# Patient Record
Sex: Male | Born: 1950 | Race: White | Hispanic: No | Smoking: Never smoker
Health system: Southern US, Community
[De-identification: ages and names within clinical notes are randomized; demographics above are authoritative.]

## PROBLEM LIST (undated history)

## (undated) DIAGNOSIS — C187 Malignant neoplasm of sigmoid colon: Secondary | ICD-10-CM

## (undated) DIAGNOSIS — R112 Nausea with vomiting, unspecified: Secondary | ICD-10-CM

## (undated) DIAGNOSIS — I10 Essential (primary) hypertension: Secondary | ICD-10-CM

## (undated) DIAGNOSIS — J189 Pneumonia, unspecified organism: Secondary | ICD-10-CM

## (undated) DIAGNOSIS — D649 Anemia, unspecified: Secondary | ICD-10-CM

## (undated) DIAGNOSIS — K56609 Unspecified intestinal obstruction, unspecified as to partial versus complete obstruction: Secondary | ICD-10-CM

## (undated) DIAGNOSIS — J869 Pyothorax without fistula: Secondary | ICD-10-CM

## (undated) DIAGNOSIS — E876 Hypokalemia: Secondary | ICD-10-CM

## (undated) DIAGNOSIS — I700 Atherosclerosis of aorta: Secondary | ICD-10-CM

## (undated) DIAGNOSIS — Z8673 Personal history of transient ischemic attack (TIA), and cerebral infarction without residual deficits: Secondary | ICD-10-CM

## (undated) DIAGNOSIS — I6529 Occlusion and stenosis of unspecified carotid artery: Secondary | ICD-10-CM

## (undated) DIAGNOSIS — I708 Atherosclerosis of other arteries: Secondary | ICD-10-CM

## (undated) DIAGNOSIS — J181 Lobar pneumonia, unspecified organism: Secondary | ICD-10-CM

## (undated) DIAGNOSIS — E860 Dehydration: Secondary | ICD-10-CM

## (undated) DIAGNOSIS — I7090 Unspecified atherosclerosis: Secondary | ICD-10-CM

## (undated) DIAGNOSIS — K635 Polyp of colon: Secondary | ICD-10-CM

## (undated) DIAGNOSIS — R140 Abdominal distension (gaseous): Secondary | ICD-10-CM

## (undated) DIAGNOSIS — E871 Hypo-osmolality and hyponatremia: Secondary | ICD-10-CM

## (undated) DIAGNOSIS — E7800 Pure hypercholesterolemia, unspecified: Secondary | ICD-10-CM

## (undated) DIAGNOSIS — J90 Pleural effusion, not elsewhere classified: Secondary | ICD-10-CM

## (undated) DIAGNOSIS — R911 Solitary pulmonary nodule: Secondary | ICD-10-CM

## (undated) DIAGNOSIS — I639 Cerebral infarction, unspecified: Secondary | ICD-10-CM

## (undated) HISTORY — DX: Occlusion and stenosis of unspecified carotid artery: I65.29

## (undated) HISTORY — DX: Essential (primary) hypertension: I10

## (undated) HISTORY — DX: Anemia, unspecified: D64.9

## (undated) HISTORY — DX: Unspecified atherosclerosis: I70.90

## (undated) HISTORY — DX: Hypokalemia: E87.6

## (undated) HISTORY — DX: Pyothorax without fistula: J86.9

## (undated) HISTORY — DX: Hypo-osmolality and hyponatremia: E87.1

## (undated) HISTORY — DX: Nausea with vomiting, unspecified: R11.2

## (undated) HISTORY — DX: Cerebral infarction, unspecified: I63.9

## (undated) HISTORY — DX: Pneumonia, unspecified organism: J18.9

## (undated) HISTORY — DX: Personal history of transient ischemic attack (TIA), and cerebral infarction without residual deficits: Z86.73

## (undated) HISTORY — DX: Lobar pneumonia, unspecified organism: J18.1

## (undated) HISTORY — DX: Pleural effusion, not elsewhere classified: J90

## (undated) HISTORY — DX: Polyp of colon: K63.5

## (undated) HISTORY — DX: Malignant neoplasm of sigmoid colon: C18.7

## (undated) HISTORY — DX: Atherosclerosis of other arteries: I70.8

## (undated) HISTORY — DX: Unspecified intestinal obstruction, unspecified as to partial versus complete obstruction: K56.609

## (undated) HISTORY — DX: Abdominal distension (gaseous): R14.0

## (undated) HISTORY — DX: Dehydration: E86.0

---

## 1982-05-29 HISTORY — PX: ANKLE FRACTURE SURGERY: SHX122

## 2008-05-29 DIAGNOSIS — I639 Cerebral infarction, unspecified: Secondary | ICD-10-CM

## 2008-05-29 HISTORY — DX: Cerebral infarction, unspecified: I63.9

## 2011-02-14 ENCOUNTER — Emergency Department (HOSPITAL_COMMUNITY): Payer: Self-pay

## 2011-02-14 ENCOUNTER — Inpatient Hospital Stay (HOSPITAL_COMMUNITY)
Admission: EM | Admit: 2011-02-14 | Discharge: 2011-02-17 | DRG: 066 | Disposition: A | Discharge status: Home / Self Care | Payer: Self-pay | Attending: Internal Medicine | Admitting: Internal Medicine

## 2011-02-14 DIAGNOSIS — R4701 Aphasia: Secondary | ICD-10-CM | POA: Diagnosis present

## 2011-02-14 DIAGNOSIS — I10 Essential (primary) hypertension: Secondary | ICD-10-CM | POA: Diagnosis present

## 2011-02-14 DIAGNOSIS — I63239 Cerebral infarction due to unspecified occlusion or stenosis of unspecified carotid arteries: Principal | ICD-10-CM | POA: Diagnosis present

## 2011-02-14 DIAGNOSIS — I658 Occlusion and stenosis of other precerebral arteries: Secondary | ICD-10-CM | POA: Diagnosis present

## 2011-02-15 ENCOUNTER — Inpatient Hospital Stay (HOSPITAL_COMMUNITY): Payer: Self-pay

## 2011-02-15 DIAGNOSIS — I6529 Occlusion and stenosis of unspecified carotid artery: Secondary | ICD-10-CM

## 2011-02-15 LAB — URINALYSIS, ROUTINE W REFLEX MICROSCOPIC
Ketones, ur: NEGATIVE mg/dL
Nitrite: NEGATIVE
Urobilinogen, UA: 1 mg/dL (ref 0.0–1.0)
pH: 5.5 (ref 5.0–8.0)

## 2011-02-15 LAB — DIFFERENTIAL
Basophils Absolute: 0 10*3/uL (ref 0.0–0.1)
Basophils Relative: 1 % (ref 0–1)
Lymphocytes Relative: 22 % (ref 12–46)
Neutro Abs: 5.8 10*3/uL (ref 1.7–7.7)
Neutrophils Relative %: 67 % (ref 43–77)

## 2011-02-15 LAB — CK TOTAL AND CKMB (NOT AT ARMC)
CK, MB: 2.3 ng/mL (ref 0.3–4.0)
Relative Index: INVALID (ref 0.0–2.5)
Total CK: 81 U/L (ref 7–232)

## 2011-02-15 LAB — COMPREHENSIVE METABOLIC PANEL
ALT: 28 U/L (ref 0–53)
Albumin: 4.1 g/dL (ref 3.5–5.2)
Alkaline Phosphatase: 55 U/L (ref 39–117)
Alkaline Phosphatase: 51 U/L (ref 39–117)
BUN: 16 mg/dL (ref 6–23)
CO2: 29 mEq/L (ref 19–32)
Calcium: 9.8 mg/dL (ref 8.4–10.5)
Calcium: 9.1 mg/dL (ref 8.4–10.5)
GFR calc Af Amer: >60 mL/min (ref >60)
GFR calc Af Amer: >60 mL/min (ref >60)
GFR calc non Af Amer: >60 mL/min (ref >60)
Glucose, Bld: 88 mg/dL (ref 70–99)
Glucose, Bld: 109 mg/dL — ABNORMAL HIGH (ref 70–99)
Potassium: 3.6 mEq/L (ref 3.5–5.1)
Potassium: 3.7 mEq/L (ref 3.5–5.1)
Sodium: 140 mEq/L (ref 135–145)
Total Protein: 7.9 g/dL (ref 6.0–8.3)
Total Protein: 7.4 g/dL (ref 6.0–8.3)

## 2011-02-15 LAB — RAPID URINE DRUG SCREEN, HOSP PERFORMED
Barbiturates: NOT DETECTED
Cocaine: NOT DETECTED
Opiates: NOT DETECTED
Tetrahydrocannabinol: POSITIVE — AB

## 2011-02-15 LAB — CBC
HCT: 45.1 % (ref 39.0–52.0)
Hemoglobin: 15.5 g/dL (ref 13.0–17.0)
MCH: 29.4 pg (ref 26.0–34.0)
MCHC: 33.5 g/dL (ref 30.0–36.0)
Platelets: 212 10*3/uL (ref 150–400)
RBC: 5.19 MIL/uL (ref 4.22–5.81)
WBC: 8.7 10*3/uL (ref 4.0–10.5)

## 2011-02-15 LAB — URINE MICROSCOPIC-ADD ON

## 2011-02-15 LAB — LIPID PANEL
Cholesterol: 215 mg/dL — ABNORMAL HIGH (ref 0–200)
HDL: 35 mg/dL — ABNORMAL LOW (ref >39)
Total CHOL/HDL Ratio: 6.1 RATIO
VLDL: 33 mg/dL (ref 0–40)

## 2011-02-15 LAB — AMMONIA: Ammonia: 14 umol/L (ref 11–60)

## 2011-02-15 LAB — APTT: aPTT: 30 seconds (ref 24–37)

## 2011-02-15 LAB — PROTIME-INR
INR: 1.02 (ref 0.00–1.49)
Prothrombin Time: 13.3 seconds (ref 11.6–15.2)
Prothrombin Time: 13.6 seconds (ref 11.6–15.2)

## 2011-02-15 LAB — CARDIAC PANEL(CRET KIN+CKTOT+MB+TROPI)
Relative Index: INVALID (ref 0.0–2.5)
Total CK: 75 U/L (ref 7–232)
Troponin I: <0.3 ng/mL (ref <0.30)

## 2011-02-15 LAB — HEMOGLOBIN A1C: Mean Plasma Glucose: 114 mg/dL (ref <117)

## 2011-02-16 DIAGNOSIS — I6789 Other cerebrovascular disease: Secondary | ICD-10-CM

## 2011-02-17 LAB — URINE CULTURE
Colony Count: >=100000
Culture  Setup Time: 201209192118

## 2011-02-18 NOTE — Consult Note (Signed)
Alex [name], Alex [name]              ACCOUNT NO.:  [id number]  MEDICAL RECORD NO.:  [id number]  LOCATION:  3012                         FACILITY:  [facility]  PHYSICIAN:  Weston Settle, MD   DATE OF BIRTH:  July 21, 1950  DATE OF CONSULTATION:  02/15/2011 DATE OF DISCHARGE:                                CONSULTATION   REASON FOR CONSULTATION:  Aphasia.  CONSULTING PHYSICIAN:  [facility].  HISTORY:  The patient is a 60 year old male with no significant past medical history who presented to the [facility] ED with complaints of headache and confusion with speech difficulty mainly and difficulty in repetition.  Family member noticed this after the patient came back from work and resolved by the time he arrived to the ED, which was approximately 2 hours.  Upon arrival to the ED, head CT was performed, which was negative.  The patient was subsequently admitted for TIA workup.  Upon examining the patient, the patient has no major focaldeficits and is able to follow commands.  His speech is fluent, but sometimes incoherent.  Family member who is with him stated that this is not his baseline.  The patient denies any other complaints.  Denies headaches, chest pain, or shortness of breath.  PAST MEDICAL HISTORY:  None.  HOME MEDICATIONS:  None.  CURRENT MEDICATIONS:  Aspirin, Tylenol, hydralazine, and Zofran.  ALLERGIES:  No known drug allergies.  FAMILY HISTORY:  Significant for hypertension, otherwise noncontributory.  SOCIAL HISTORY:  The patient denies smoking, denies alcohol, denies drugs.  REVIEW OF SYSTEMS:  Negative except for HPI.  PHYSICAL EXAMINATION:  VITALS:  Blood pressure 162/94, pulse 69, respiratory rate 18, temperature 98.4, O2 saturation 94% on room air. MENTAL STATUS:  Alert and oriented x3, carries out two-step commands. CRANIAL NERVES:  Eyes, pupils equal, round, and reactive to light. Extraocular movements intact.  Visual fields intact.  Face  symmetrical. Tongue is less midline and displays conductive aphasia.  Facial sensation intact.  Coordination, finger-to-nose is intact.  Gait not assessed.  Motor strength 5/5, symmetric in all extremities.  Deep tendon reflexes 2+ and symmetric in all extremities.  Sensation intact. Negative drift. PULMONARY:  Clear to auscultation bilaterally. CARDIOVASCULAR:  Regular rate and rhythm.  No murmurs, rubs, or gallops.  LABORATORY FINDINGS:  Sodium 140, potassium 3.7, chloride 102, bicarb 27, BUN 16, creatinine 0.87, glucose 109.  White count 8.7, hemoglobin 15.5, hematocrit 45.1, platelet count 201.  Total cholesterol 215, HDL 35, LDL 147, triglycerides 165.  IMAGING: 1. Head CT was negative. 2. 2-D echo is pending. 3. MRI shows a 1.5 cm region of acute infarct in the deep insula of     left temporoparietal region.  MRA shows completely occluded left     internal carotid artery most likely chronic.  ASSESSMENT/PLAN:  The patient is a 60 year old male with no significant past medical history who presents with left insular acute stroke withmainly conductive-type aphasia, which is resolving.  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 Therefore, I would like to maintain his systolic blood pressure between 140-160, also changing aspirin dose from 325 to 81 mg daily, starting Crestor 10 mg daily given his  elevated LDL, discontinue hydralazine given that it may decrease perfusion in the setting of left intracranial occlusion which may be catastrophic; however, if blood pressure is more than 180 may give a small dose of labetalol IV every 6 hours as needed. Order a 2-D echo to rule out wall motion abnormalities.  If you have any questions, please call.  Thank you very much for your consult.          ______________________________ Weston Settle, MD     SE/MEDQ  D:  02/15/2011  T:  02/16/2011  Job:  [id number]  Electronically Signed by  Weston Settle MD on 02/18/2011 03:57:25 PM

## 2011-02-26 NOTE — H&P (Signed)
Alex [name], Alex [name]              ACCOUNT NO.:  [id number]  MEDICAL RECORD NO.:  [id number]  LOCATION:  [facility]                         FACILITY:  [facility]  PHYSICIAN:  Eduard Clos, MDDATE OF BIRTH:  1950-09-30  DATE OF ADMISSION:  02/14/2011 DATE OF DISCHARGE:                             HISTORY & PHYSICAL   PRIMARY CARE PHYSICIAN:  Unassigned.  The patient does not have one.  CHIEF COMPLAINT:  Headache and confusion.  HISTORY OF PRESENT ILLNESS:  A 60 year old male with no significant past medical history was brought into ER.  The patient was found to be increasingly confused by the patient's family.  The patient at this time is still having some difficulty bringing out exact words.  He does speak something which is not clear and sometimes he speaks.  Initially when he was in the ER at 9 o'clock, he was giving history to the PA Dammen.  He was saying that he was back from work at 4 o'clock when he felt headache all over head and suddenly became confused and the family noticed and later on they brought him to ER.  He did not lose his consciousness, did not have any nausea, vomiting, or any blurriness in eye vision.  He did not have any focal deficit.  He did not have any chest pain, shortness of breath, nausea, vomiting, abdominal pain, dysuria, discharge, or diarrhea.  In the ER the patient had a CT head and at this time Neurology on call was consulted.  They have advised hospital admission and they will be seeing the patient in consult.  Further workup for possible CVA.  The patient at this time able to say where he is and his name, date of birth, date and time.  When I asked him what happened, he said he had some headaches and he sometimes states current words which are not clear and not able to exactly able to express.  Denies any pain.  He says the headache is resolved.  PAST MEDICAL HISTORY:  Nothing significant.  MEDICATIONS PRIOR TO ADMISSION:  None.  SOCIAL  HISTORY:  The patient denies smoking cigarettes, drinking alcohol, or using illegal drugs.  FAMILY HISTORY:  Nothing contributory.  REVIEW OF SYSTEM:  As per history of presenting illness, nothing else significant.  PHYSICAL EXAMINATION:  GENERAL:  The patient examined at bedside, not in acute distress. VITAL SIGNS:  Blood pressure 166/84, pulse 70 per minute, temperature 98.4, respiration 18, O2 sat 98%. HEENT:  Anicteric.  No pallor.  No discharge from ears, eyes, nose, or mouth.  No facial asymmetry.  PERRLA positive.  The patient is able to see in both eyes. NECK:  No neck rigidity.  No discharge from ears, eyes, nose, or mouth. CHEST:  Bilateral air entry present.  No rhonchi, no crepitation. HEART:  S1, S2 heard. ABDOMEN:  Soft, nontender.  Bowel sounds heard. CNS:  The patient is alert, awake, oriented to time, place, and person. At times he is confused and has difficulty to bring out words.  Moves upper and lower extremities 5/5.  There is no pronator drift.  No dysdiadochokinesia, no ataxia. EXTREMITIES:  Peripheral pulses felt.  No acute ischemic changes, cyanosis,  clubbing, or edema.  LABORATORY DATA:  EKG shows normal sinus rhythm with poor R-wave progression with nonspecific ST-T changes.  No old EKG to compare.  CT of the head without contrast shows no evidence of acute intracranial abnormality.  CBC:  WBC 10.8, hemoglobin 15.9, hematocrit is 47.5, platelets 212,000.  PT/INR is 13.3 and 0.9.  Complete metabolic panel: Sodium 141, potassium 3.6, chloride 101, carbon dioxide 29, glucose 88, BUN 16, creatinine 0.9, total bilirubin is 0.4, alkaline phosphatase 55, AST 30, ALT 31, total protein 7.9, albumin 4.4, calcium 9.8.  ASSESSMENT: 1. Possible cerebrovascular accident with transient ischemic attack. 2. Elevated blood pressure.  PLAN: 1. At this time admit the patient to telemetry. 2. For his episodes of confusion, headache, and episodes of expressing      difficulties we at this time consider possible cerebrovascular     accident versus transient ischemic attack.  We are going to get MRI     of the brain, MRA of the brain, 2-D echo, carotid Doppler.  We will     be consulting Neurology, Dr. Vickey Huger who is going to see the     patient later in consult.  I already discussed with Dr. Vickey Huger.     The patient was not a candidate for TPA as symptoms are improving. 3. Elevated blood pressure.  At this time we are going to keep the     patient on p.r.n. hydralazine for systolic blood pressure more than     200 and diastolic more than 110 and if blood pressure is still high     during the stay, he will need definite antihypertensives     eventually. 4. We are going to get a chest x-ray, ammonia level of urine and urine     drug screen. 5. Further recommendations as condition evolves.     Eduard Clos, MD     ANK/MEDQ  D:  02/15/2011  T:  02/15/2011  Job:  [id number]  Electronically Signed by Midge Minium MD on 02/26/2011 11:59:42 AM

## 2011-03-01 NOTE — Consult Note (Signed)
NAMEMANSOUR, BALBOA              ACCOUNT NO.:  [id number]  MEDICAL RECORD NO.:  [id number]  LOCATION:  3012                         FACILITY:  [facility]  PHYSICIAN:  Janetta Hora. Nga Rabon, MD  DATE OF BIRTH:  11-Feb-1951  DATE OF CONSULTATION:  02/15/2011 DATE OF DISCHARGE:                                CONSULTATION   REASON FOR CONSULTATION:  Left internal carotid artery occlusion with stroke.  REQUESTING PHYSICIAN:  Baltazar Najjar, MD, [facility].  HISTORY OF PRESENT ILLNESS:  The patient is a 60 year old male who presented the emergency room yesterday.  At that time, he had increased confusion and some speech difficulty.  He was having difficulty gathering words.  He did not really seem to have any significant motor deficits.  He still has some confusion today and mild slurring of speech.  He also still has some trouble gathering words.  MRA of the head revealed a left intracranial internal carotid artery occlusion with reconstitution distally.  Atherosclerotic risk factors, hypertension.  The patient denies history of tobacco abuse or elevated cholesterol or coronary artery disease.  He has no family history of early vascular disease at age less than 41.  He does have several relatives who have a coronary artery disease in their 37s.  PAST MEDICAL HISTORY:  As above.  PAST SURGICAL HISTORY:  None.  REVIEW OF SYSTEMS:  He denies chest pain.  He has no prior history of stroke.  He has no shortness of breath.  He describes no visual changes.  MEDICATIONS:  The patient was on no medications prior to hospitalization.  He is currently on aspirin 325 mg once a day, Ativan p.r.n., hydralazine p.r.n.  He has no known drug allergies.  SOCIAL HISTORY:  Denies tobacco abuse.  Denies alcohol or drug abuse.  PHYSICAL EXAMINATION:  VITAL SIGNS:  Blood pressure 176/101, temperature 97.9, pulse 74, respirations 20, oxygen saturation 96% on room air. HEENT:  Negative. NECK:   Carotid pulses 2+. CHEST:  Clear to auscultation. EXTREMITIES:  Radial pulses 2+ bilaterally. SKIN:  No rash. NEUROLOGIC:  Upper extremity and lower extremity motor strength 5/5. Finger-nose-finger intact.  Cranial nerves II through XII intact.  MRA of the brain as well as MRI of the brain and CT scan of the head were all reviewed.  There is occlusion of the left internal carotid artery with reconstitution of the supraclinoid ICA.  The portions of the right internal carotid artery that are visualized are without stenosis. There is an insular infarct on the left side with some surrounding petechiae.  ASSESSMENT:  Stroke with left internal carotid artery occlusion.  After an internal carotid artery has been occluded, there is no benefit of revascularization for stroke prophylaxis.  However, I would obtain a duplex exam of his internal carotid artery bilaterally to confirm whether or not he has any significant stenosis of the vertebral arteries as well as evaluate the right internal carotid artery as he may have contralateral disease.  We will follow along until the duplex results are obtained.     Janetta Hora. Oziel Beitler, MD     CEF/MEDQ  D:  02/15/2011  T:  02/16/2011  Job:  [id number]  Electronically Signed  by Fabienne Bruns MD on 03/01/2011 11:40:55 AM

## 2011-03-02 ENCOUNTER — Ambulatory Visit: Payer: Self-pay | Attending: Internal Medicine

## 2011-03-02 DIAGNOSIS — I69920 Aphasia following unspecified cerebrovascular disease: Secondary | ICD-10-CM | POA: Insufficient documentation

## 2011-03-02 DIAGNOSIS — IMO0001 Reserved for inherently not codable concepts without codable children: Secondary | ICD-10-CM | POA: Insufficient documentation

## 2011-03-08 NOTE — Discharge Summary (Signed)
Alex [name], Alex [name]              ACCOUNT NO.:  [id number]  MEDICAL RECORD NO.:  [id number]  LOCATION:  3012                         FACILITY:  [facility]  PHYSICIAN:  Baltazar Najjar, MD     DATE OF BIRTH:  10/29/1950  DATE OF ADMISSION:  02/14/2011 DATE OF DISCHARGE:                              DISCHARGE SUMMARY   FINAL DISCHARGE DIAGNOSES: 1. Left insular acute infarct. 2. Hyperlipidemia. 3. Bilateral carotid artery occlusion, left more than right.  CONSULTATIONS: 1. Neurology Service. 2. Vascular Surgery.  The patient was seen by Dr. Fabienne Bruns.  RADIOLOGY/IMAGING STUDIES: 1. CT head done February 15, 2011 showed no evidence of acute     intracranial abnormality. 2. MRI of the brain showed 1.5 cm region of acute infarction in the     deep insular on the left.  Some petechial blood products, but no     frank hematoma.  Supple areas of low level restricted diffusion in     the temporal parietal region, posterior to the definite infarction     could developing to infarction subsequently. 3. MRA head showed complete occlusion of the left internal carotid     artery.  Reconstitution of the flow in the left supraclinoid ICA     apparently primarily from fluid throughout patent anterior     communicating artery.  There is also probably contribution from the     left posterior communicating artery. 4. Carotid duplex ultrasound showed left ICA complete occlusion, right     ICA showed 40% to 59% stenosis.  Vertebral flow is antegrade     bilaterally. 5. 2-D echocardiogram was poor quality study, however, showed EF of     60%.  Atrial septum was unable to visualize.  There was no reported     valvular disease.  BRIEF ADMITTING HISTORY:  Please refer to H and P for more details.  On summary, Alex [name] is a 60 year old man with no significant past medical history, presented to the ER on February 15, 2011 with chief complaint of headaches and confusion and difficulty  with his speech.  Please refer to H and P for more details.  HOSPITAL COURSE:  The patient was admitted with possible CVA versus TIA. His imaging studies done showed evidence of left insular infarct as above.  The patient was seen by Neurology Service and he was started on aspirin and Plavix.  His MRI also showed evidence of left carotid artery complete occlusion.  He was seen by Dr. Darrick Penna from Vascular Surgery who recommended medical treatment and he ordered carotid duplex study, results as above, confirmed the complete occlusion on the left and showed 40% to 59% occlusion on the right side.  The patient will be discharged on aspirin and Plavix as per Neurology recommendation to continue on aspirin and Plavix for 3 months, then continue with aspirin only after work.  The patient's blood pressure was initially elevated. He was given IV labetalol p.r.n.  However, his blood pressure stabilizes after works, he will need to be monitored closely and if he needs to be started on antihypertensive medicines as an outpatient that needs to be decided by his PCP.  However, his blood  pressure is currently stable.  The patient's labs also showed evidence of hyperlipidemia.  His LDL cholesterol was 147.  Cholesterol was 215.  His triglyceride was 165. The patient was started on Crestor and he will be discharged on it to follow with his PCP.  His initial workup also showed evidence of pyuria, so the patient was started on Rocephin IV and his urine culture was sent.  As of today, his urine culture report still preliminary and culture reintubated for better growth. Today is his third day of Rocephin, so I will not discharge him on any antibiotics.  Culture report to be followed as an outpatient.  The patient is seen and examined by me today.  His speech is improving. He was seen by speech therapy during this hospitalization and we will arrange for speech therapy as an outpatient as well.  He has no  motor or sensory deficit on neurological exam.  He is feeling better and denies any complaints.  Today, his vital signs; blood pressure 104/70, heart rate of 64, temperature 97.9, and O2 sat 93% on room air.  The patient is medically stable for discharge to home to follow with his PCP.  DISCHARGE MEDICATIONS: 1. Aspirin 81 mg p.o. daily. 2. Plavix 75 mg p.o. daily. 3. Crestor 10 mg p.o. daily at bedtime.  DISCHARGE INSTRUCTIONS: 1. The patient to continue above medications as prescribed. 2. As per Neurology recommendation, he will need to be on aspirin and     Plavix for 3 months, then switch to aspirin alone. 3. Speech therapy will be arranged by case manager for outpatient     therapy. 4. The patient had appointment with [facility] arranged for     eligibility on February 28, 2011 at 9:45 a.m. and see [name] on     March 15, 2011 at 3 p.m.  The patient also need to see Dr. Darrick Penna     in 6 months for repeat carotid Doppler sonogram. 5. The patient advised to report any worsening of symptoms or any new     symptoms to his PCP or come to the ED if needed. 6. The patient also to follow with Neurology Service as well.7. The patient also advised on lifestyle modification, monitoring his     blood pressure, and healthy diet.  CONDITION ON DISCHARGE:  Improved.          ______________________________ Baltazar Najjar, MD     SA/MEDQ  D:  02/17/2011  T:  02/17/2011  Job:  [id number]  cc:   [name], MD [name], MD [name]  Electronically Signed by Hannah Beat MD on 03/08/2011 09:10:45 PM

## 2011-03-09 ENCOUNTER — Ambulatory Visit: Payer: Self-pay

## 2011-03-14 ENCOUNTER — Ambulatory Visit: Payer: Self-pay

## 2011-03-16 ENCOUNTER — Ambulatory Visit: Payer: Self-pay

## 2011-03-21 ENCOUNTER — Ambulatory Visit: Payer: Self-pay

## 2011-03-23 ENCOUNTER — Ambulatory Visit: Payer: Self-pay

## 2011-03-28 ENCOUNTER — Ambulatory Visit: Payer: Self-pay

## 2011-04-04 ENCOUNTER — Ambulatory Visit: Payer: Self-pay | Attending: Internal Medicine

## 2011-04-04 DIAGNOSIS — IMO0001 Reserved for inherently not codable concepts without codable children: Secondary | ICD-10-CM | POA: Insufficient documentation

## 2011-04-04 DIAGNOSIS — I69920 Aphasia following unspecified cerebrovascular disease: Secondary | ICD-10-CM | POA: Insufficient documentation

## 2011-04-11 ENCOUNTER — Ambulatory Visit: Payer: Self-pay

## 2011-08-31 ENCOUNTER — Encounter: Payer: Self-pay | Admitting: Vascular Surgery

## 2011-09-05 ENCOUNTER — Encounter: Payer: Self-pay | Admitting: Neurosurgery

## 2011-09-07 ENCOUNTER — Ambulatory Visit (INDEPENDENT_AMBULATORY_CARE_PROVIDER_SITE_OTHER): Payer: Self-pay | Admitting: *Deleted

## 2011-09-07 ENCOUNTER — Encounter: Payer: Self-pay | Admitting: Neurosurgery

## 2011-09-07 ENCOUNTER — Ambulatory Visit (INDEPENDENT_AMBULATORY_CARE_PROVIDER_SITE_OTHER): Payer: Self-pay | Admitting: Neurosurgery

## 2011-09-07 VITALS — BP 155/103 | HR 64 | Resp 16 | Ht 66.0 in | Wt 223.3 lb

## 2011-09-07 DIAGNOSIS — I6529 Occlusion and stenosis of unspecified carotid artery: Secondary | ICD-10-CM

## 2011-09-07 NOTE — Progress Notes (Addendum)
[facility] [place] [facility] HISTORY AND PHYSICAL   CC: Initial carotid duplex exam in office Referring Physician: Fields  History of Present Illness: 61-year-old male patient who was seen in consultation by Dr. Darrick Penna September 2012 after a CVA. Patient reports no further signs or symptoms of CVA, TIA. Amaurosis fugax, diplopia., Dysphasia. Patient has not been followed at another office status post CVA.  Past Medical History  Diagnosis Date  . Stroke   . Hypertension   . Atherosclerosis of arteries   . Carotid artery occlusion     ROS: [x]  Positive   [ ]  Denies    General: [ ]  Weight loss, [ ]  Fever, [ ]  chills Neurologic: [ ]  Dizziness, [ ]  Blackouts, [ ]  Seizure [ ]  Stroke, [ ]  "Mini stroke", [ ]  Slurred speech, [ ]  Temporary blindness; [ ]  weakness in arms or legs, [ ]  Hoarseness Cardiac: [ ]  Chest pain/pressure, [ ]  Shortness of breath at rest [ ]  Shortness of breath with exertion, [ ]  Atrial fibrillation or irregular heartbeat Vascular: [ ]  Pain in legs with walking, [ ]  Pain in legs at rest, [ ]  Pain in legs at night,  [ ]  Non-healing ulcer, [ ]  Blood clot in vein/DVT,   Pulmonary: [ ]  Home oxygen, [ ]  Productive cough, [ ]  Coughing up blood, [ ]  Asthma,  [ ]  Wheezing Musculoskeletal:  [ ]  Arthritis, [ ]  Low back pain, [ ]  Joint pain Hematologic: [ ]  Easy Bruising, [ ]  Anemia; [ ]  Hepatitis Gastrointestinal: [ ]  Blood in stool, [ ]  Gastroesophageal Reflux/heartburn, [ ]  Trouble swallowing Urinary: [ ]  chronic Kidney disease, [ ]  on HD - [ ]  MWF or [ ]  TTHS, [ ]  Burning with urination, [ ]  Difficulty urinating Skin: [ ]  Rashes, [ ]  Wounds Psychological: [ ]  Anxiety, [ ]  Depression   Social History History  Substance Use Topics  . Smoking status: Never Smoker   . Smokeless tobacco: Not on file  . Alcohol Use: No    Family History Family History  Problem Relation Age of Onset  . Heart disease Other   . Hypertension Father   . Diabetes Sister   .  Diabetes Brother     No Known Allergies  Current Outpatient Prescriptions  Medication Sig Dispense Refill  . aspirin 81 MG tablet Take 81 mg by mouth daily.      . clopidogrel (PLAVIX) 75 MG tablet Take 75 mg by mouth daily.      . simvastatin (ZOCOR) 10 MG tablet Take 10 mg by mouth at bedtime.      Marland Kitchen aspirin 325 MG tablet Take 81 mg by mouth daily.       Marland Kitchen HYDRALAZINE HCL PO Take by mouth.      Marland Kitchen LORazepam (ATIVAN PO) Take by mouth.        Physical Examination  Filed Vitals:   09/07/11 1104  BP: 155/103  Pulse: 64  Resp: 16    Body mass index is 36.04 kg/(m^2).  General:  WDWN in NAD Gait: Normal HEENT: WNL Eyes: Pupils equal Pulmonary: normal non-labored breathing , without Rales, rhonchi,  wheezing Cardiac: RRR, without  Murmurs, rubs or gallops; Abdomen: soft, NT, no masses Skin: no rashes, ulcers noted  Vascular Exam Pulses: Patient has 2+ radial pulses bilaterally Carotid bruits are not heard, left carotid is occluded, right carotid has a weakened pulse auscultation Extremities without ischemic changes, no Gangrene , no cellulitis; no open wounds;  Musculoskeletal: no muscle wasting  or atrophy   Neurologic: A&O X 3; Appropriate Affect ; SENSATION: normal; MOTOR FUNCTION:  moving all extremities equally. Speech is fluent/normal  Non-Invasive Vascular Imaging CAROTID DUPLEX 09/07/2011  Right ICA 40 - 59 % stenosis Left ICA 0ccluded stenosis   ASSESSMENT/PLAN: Assessment per above, plan will be for the patient to return in 6 months for repeat carotid duplex. I did discuss this with Dr. Darrick Penna and he agrees. The patient is in agreement his questions were encouraged and answered we'll see him back in 6 months or sooner if needed.  Lauree Chandler ANP   Clinic MD: Fields  Pt with left ICA occlusion.  No indication for revascularization in this situation Contralateral asymptomatic moderate stenosis needs long term follow up  Fabienne Bruns, MD [facility] [place] Office: [phone] Pager: [phone]

## 2011-09-15 NOTE — Procedures (Unsigned)
CAROTID DUPLEX EXAM  INDICATION:  Carotid disease  HISTORY: Diabetes:  No Cardiac:  No Hypertension:  Yes Smoking:  No Previous Surgery:  No CV History:  CVA history 02/14/2011 Amaurosis Fugax No, Paresthesias No, Hemiparesis No                                      RIGHT             LEFT Brachial systolic pressure:         140               142 Brachial Doppler waveforms:         Normal            Normal Vertebral direction of flow:        Antegrade         Antegrade DUPLEX VELOCITIES (cm/sec) CCA peak systolic                   89                85 ECA peak systolic                   119               102 ICA peak systolic                   120               M=99 ICA end diastolic                   49                M=33 PLAQUE MORPHOLOGY:                  Heterogeneous     Heterogeneous PLAQUE AMOUNT:                      Moderate          Moderate/severe PLAQUE LOCATION:                    ICA               ICA  IMPRESSION: 1. Right internal carotid artery velocities suggest 40%-59% stenosis. 2. Left internal carotid artery is seen with a small amount of blood     flow in the proximal and mid segments, then appears to occlude     distally. 3. Bilateral antegrade vertebral arteries.  ___________________________________________ Janetta Hora Fields, MD  EM/MEDQ  D:  09/08/2011  T:  09/08/2011  Job:  [id number]

## 2012-03-08 ENCOUNTER — Other Ambulatory Visit: Payer: Self-pay | Admitting: *Deleted

## 2012-03-08 DIAGNOSIS — I63239 Cerebral infarction due to unspecified occlusion or stenosis of unspecified carotid arteries: Secondary | ICD-10-CM

## 2012-03-13 ENCOUNTER — Encounter: Payer: Self-pay | Admitting: Neurosurgery

## 2012-03-14 ENCOUNTER — Other Ambulatory Visit (INDEPENDENT_AMBULATORY_CARE_PROVIDER_SITE_OTHER): Payer: Self-pay | Admitting: *Deleted

## 2012-03-14 ENCOUNTER — Encounter: Payer: Self-pay | Admitting: Neurosurgery

## 2012-03-14 ENCOUNTER — Ambulatory Visit (INDEPENDENT_AMBULATORY_CARE_PROVIDER_SITE_OTHER): Payer: Self-pay | Admitting: Neurosurgery

## 2012-03-14 VITALS — BP 149/98 | HR 62 | Resp 16 | Ht 67.0 in | Wt 228.3 lb

## 2012-03-14 DIAGNOSIS — I6529 Occlusion and stenosis of unspecified carotid artery: Secondary | ICD-10-CM

## 2012-03-14 DIAGNOSIS — I63239 Cerebral infarction due to unspecified occlusion or stenosis of unspecified carotid arteries: Secondary | ICD-10-CM

## 2012-03-14 NOTE — Progress Notes (Signed)
[facility] [place] Carotid Office Note  CC: Carotid surveillance Referring Physician: Fields  History of Present Illness: 61 year old male patient of Dr. Darrick Penna with no history of carotid intervention. The patient denies any signs or symptoms of CVA, TIA, amaurosis fugax or any neural deficit. The patient denies any new medical diagnoses or recent surgery.  Past Medical History  Diagnosis Date  . Stroke   . Hypertension   . Atherosclerosis of arteries   . Carotid artery occlusion     ROS: [x]  Positive   [ ]  Denies    General: [ ]  Weight loss, [ ]  Fever, [ ]  chills Neurologic: [ ]  Dizziness, [ ]  Blackouts, [ ]  Seizure [ ]  Stroke, [ ]  "Mini stroke", [ ]  Slurred speech, [ ]  Temporary blindness; [ ]  weakness in arms or legs, [ ]  Hoarseness Cardiac: [ ]  Chest pain/pressure, [ ]  Shortness of breath at rest [ ]  Shortness of breath with exertion, [ ]  Atrial fibrillation or irregular heartbeat Vascular: [ ]  Pain in legs with walking, [ ]  Pain in legs at rest, [ ]  Pain in legs at night,  [ ]  Non-healing ulcer, [ ]  Blood clot in vein/DVT,   Pulmonary: [ ]  Home oxygen, [ ]  Productive cough, [ ]  Coughing up blood, [ ]  Asthma,  [ ]  Wheezing Musculoskeletal:  [ ]  Arthritis, [ ]  Low back pain, [ ]  Joint pain Hematologic: [ ]  Easy Bruising, [ ]  Anemia; [ ]  Hepatitis Gastrointestinal: [ ]  Blood in stool, [ ]  Gastroesophageal Reflux/heartburn, [ ]  Trouble swallowing Urinary: [ ]  chronic Kidney disease, [ ]  on HD - [ ]  MWF or [ ]  TTHS, [ ]  Burning with urination, [ ]  Difficulty urinating Skin: [ ]  Rashes, [ ]  Wounds Psychological: [ ]  Anxiety, [ ]  Depression   Social History History  Substance Use Topics  . Smoking status: Never Smoker   . Smokeless tobacco: Not on file  . Alcohol Use: No    Family History Family History  Problem Relation Age of Onset  . Heart disease Other   . Hypertension Father   . Stroke Father   . Diabetes Sister   . Diabetes Brother     No  Known Allergies  Current Outpatient Prescriptions  Medication Sig Dispense Refill  . aspirin 81 MG tablet Take 81 mg by mouth daily.      . clopidogrel (PLAVIX) 75 MG tablet Take 75 mg by mouth daily.      . simvastatin (ZOCOR) 10 MG tablet Take 10 mg by mouth at bedtime.      Marland Kitchen aspirin 325 MG tablet Take 81 mg by mouth daily.       Marland Kitchen HYDRALAZINE HCL PO Take by mouth.      Marland Kitchen LORazepam (ATIVAN PO) Take by mouth.        Physical Examination  Filed Vitals:   03/14/12 1455  BP: 149/98  Pulse: 62  Resp:     Body mass index is 35.76 kg/(m^2).  General:  WDWN in NAD Gait: Normal HEENT: WNL Eyes: Pupils equal Pulmonary: normal non-labored breathing , without Rales, rhonchi,  wheezing Cardiac: RRR, without  Murmurs, rubs or gallops; Abdomen: soft, NT, no masses Skin: no rashes, ulcers noted  Vascular Exam Pulses: 3+ radial pulses bilaterally Carotid bruits: Right carotid pulse to auscultation, left occlusion Extremities without ischemic changes, no Gangrene , no cellulitis; no open wounds;  Musculoskeletal: no muscle wasting or atrophy   Neurologic: A&O X 3; Appropriate Affect ; SENSATION:  normal; MOTOR FUNCTION:  moving all extremities equally. Speech is fluent/normal  Non-Invasive Vascular Imaging CAROTID DUPLEX 03/14/2012  Right ICA 20 - 39 % stenosis Left ICA 0ccluded stenosis   ASSESSMENT/PLAN: asymptomatic patient with minimal right carotid stenosis. This is unchanged from previous exam. The patient will followup in one year with repeat carotid duplex, his questions were encouraged and answered, he is in agreement with this plan.  Lauree Chandler ANP   Clinic MD: Darrick Penna

## 2012-03-15 NOTE — Addendum Note (Signed)
Addended by: Sharee Pimple on: 03/15/2012 07:43 AM   Modules accepted: Orders

## 2013-03-12 ENCOUNTER — Encounter: Payer: Self-pay | Admitting: Family

## 2013-03-13 ENCOUNTER — Inpatient Hospital Stay (HOSPITAL_COMMUNITY): Admission: RE | Admit: 2013-03-13 | Payer: Self-pay | Source: Ambulatory Visit

## 2013-03-13 ENCOUNTER — Ambulatory Visit: Payer: Self-pay | Admitting: Family

## 2013-04-02 ENCOUNTER — Encounter: Payer: Self-pay | Admitting: Family

## 2013-04-03 ENCOUNTER — Ambulatory Visit: Payer: Self-pay | Admitting: Family

## 2013-04-03 ENCOUNTER — Other Ambulatory Visit (HOSPITAL_COMMUNITY): Payer: Self-pay

## 2013-06-18 ENCOUNTER — Encounter: Payer: Self-pay | Admitting: Vascular Surgery

## 2013-06-19 ENCOUNTER — Ambulatory Visit (INDEPENDENT_AMBULATORY_CARE_PROVIDER_SITE_OTHER): Payer: Self-pay | Admitting: Vascular Surgery

## 2013-06-19 ENCOUNTER — Encounter: Payer: Self-pay | Admitting: Vascular Surgery

## 2013-06-19 ENCOUNTER — Ambulatory Visit (HOSPITAL_COMMUNITY)
Admission: RE | Admit: 2013-06-19 | Discharge: 2013-06-19 | Disposition: A | Discharge status: Home / Self Care | Payer: Self-pay | Source: Ambulatory Visit | Attending: Vascular Surgery | Admitting: Vascular Surgery

## 2013-06-19 VITALS — BP 136/87 | HR 74 | Ht 67.0 in | Wt 211.2 lb

## 2013-06-19 DIAGNOSIS — I6529 Occlusion and stenosis of unspecified carotid artery: Secondary | ICD-10-CM

## 2013-06-19 DIAGNOSIS — I658 Occlusion and stenosis of other precerebral arteries: Secondary | ICD-10-CM | POA: Insufficient documentation

## 2013-06-19 NOTE — Progress Notes (Signed)
Patient is a 63 year old male who returns for followup today. He was originally seen in 2012 after a stroke. He had a left internal carotid artery occlusion at that time. He had a moderate right internal carotid artery stenosis. He is currently on aspirin and Plavix. He continues to deny any symptoms of stroke TIA or amaurosis. Other chronic medical problems include hypertension.  Past Medical History  Diagnosis Date  . Stroke   . Hypertension   . Atherosclerosis of arteries   . Carotid artery occlusion     History reviewed. No pertinent past surgical history.  Review of systems: He denies shortness of breath. He denies chest pain.  Physical exam:  Filed Vitals:   06/19/13 1607 06/19/13 1609  BP: 134/89 136/87  Pulse: 74   Height: 5\' 7"  (1.702 m)   Weight: 211 lb 3.2 oz (95.8 kg)   SpO2: 97%     Neck: No carotid bruits  Chest: Clear to auscultation bilaterally  Cardiac: Regular rate and rhythm  Neuro: Symmetric upper and lower extremity motor strength 5 over 5, no facial asymmetry  Data: Patient had carotid duplex exam today which I reviewed and interpreted. 40-60% right internal carotid artery stenosis no significant change chronic left internal carotid artery occlusion, bilateral antegrade vertebral flow  Assessment: Moderate asymptomatic right internal carotid artery stenosis  Plan: Continue Plavix and aspirin followup carotid duplex scan in 1 year  Ruta Hinds, MD [facility] [place] Office: [phone] Pager: [phone]

## 2013-06-20 NOTE — Addendum Note (Signed)
Addended by: Mena Goes on: 06/20/2013 01:02 PM   Modules accepted: Orders

## 2013-06-20 NOTE — Addendum Note (Signed)
Addended by: Mena Goes on: 06/20/2013 08:18 AM   Modules accepted: Orders

## 2014-06-24 ENCOUNTER — Encounter: Payer: Self-pay | Admitting: Vascular Surgery

## 2014-06-25 ENCOUNTER — Other Ambulatory Visit (HOSPITAL_COMMUNITY): Payer: Self-pay

## 2014-06-25 ENCOUNTER — Ambulatory Visit: Payer: Self-pay | Admitting: Vascular Surgery

## 2014-07-29 ENCOUNTER — Encounter: Payer: Self-pay | Admitting: Vascular Surgery

## 2014-07-30 ENCOUNTER — Encounter: Payer: Self-pay | Admitting: Vascular Surgery

## 2014-07-30 ENCOUNTER — Ambulatory Visit (HOSPITAL_COMMUNITY)
Admission: RE | Admit: 2014-07-30 | Discharge: 2014-07-30 | Disposition: A | Discharge status: Home / Self Care | Payer: Self-pay | Source: Ambulatory Visit | Attending: Vascular Surgery | Admitting: Vascular Surgery

## 2014-07-30 ENCOUNTER — Ambulatory Visit (INDEPENDENT_AMBULATORY_CARE_PROVIDER_SITE_OTHER): Payer: Self-pay | Admitting: Vascular Surgery

## 2014-07-30 ENCOUNTER — Other Ambulatory Visit: Payer: Self-pay | Admitting: Vascular Surgery

## 2014-07-30 VITALS — BP 132/87 | HR 70 | Resp 14 | Ht 66.0 in | Wt 209.0 lb

## 2014-07-30 DIAGNOSIS — I6522 Occlusion and stenosis of left carotid artery: Secondary | ICD-10-CM

## 2014-07-30 DIAGNOSIS — I6521 Occlusion and stenosis of right carotid artery: Secondary | ICD-10-CM | POA: Insufficient documentation

## 2014-07-30 DIAGNOSIS — I6523 Occlusion and stenosis of bilateral carotid arteries: Secondary | ICD-10-CM

## 2014-07-30 NOTE — Addendum Note (Signed)
Addended by: Mena Goes on: 07/30/2014 05:09 PM   Modules accepted: Orders

## 2014-07-30 NOTE — Progress Notes (Addendum)
HISTORY AND PHYSICAL     CC:  Follow up carotid duplex scan  Referring Provider:  Dorna Mai, MD  HPI: This is a 64 y.o. male who has known carotid stenosis is here for f/u carotid duplex scan.  Denies amaurosis fugax, paresthesias, or hemiparesis.  He states that he did have a stroke in 2011. He has a chronically occluded left internal carotid artery. He did have speech difficulties.  He states that he does have some speech difficulty now, but if he slows down and thinks about it, he does not have difficulty.  He has not had any other symptoms.    He has never smoked.  He is on a statin for hypercholesterolemia.  He is on Plavix and aspirin s/p stroke.    His daughter stated that he had had some weight loss as he weighed 214lbs at her house and was 208lbs at the doctors appt a week later.  A year earlier at his annual appt, he weighed 209lbs.    Otherwise, he has been doing quite well.    Past Medical History  Diagnosis Date  . Stroke   . Hypertension   . Atherosclerosis of arteries   . Carotid artery occlusion     Past Surgical History  Procedure Laterality Date  . Foot surgery      No Known Allergies  Current Outpatient Prescriptions  Medication Sig Dispense Refill  . aspirin 81 MG tablet Take 81 mg by mouth daily.    . clopidogrel (PLAVIX) 75 MG tablet Take 75 mg by mouth daily.    Marland Kitchen HYDRALAZINE HCL PO Take by mouth.    Marland Kitchen LORazepam (ATIVAN PO) Take by mouth.    . simvastatin (ZOCOR) 10 MG tablet Take 10 mg by mouth at bedtime.    Marland Kitchen aspirin 325 MG tablet Take 81 mg by mouth daily.      No current facility-administered medications for this visit.    Pt's meds include: Statin:  Yes.   Beta Blocker:  No. Aspirin:  Yes.   Other antiplatelets/anticoagulants:  Yes.   Plavix   Family History  Problem Relation Age of Onset  . Heart disease Other   . Hypertension Father   . Stroke Father   . Heart disease Father   . Diabetes Sister   . Diabetes Brother      History   Social History  . Marital Status: Legally Separated    Spouse Name: N/A  . Number of Children: N/A  . Years of Education: N/A   Occupational History  . Not on file.   Social History Main Topics  . Smoking status: Never Smoker   . Smokeless tobacco: Never Used  . Alcohol Use: No  . Drug Use: No  . Sexual Activity: Not on file   Other Topics Concern  . Not on file   Social History Narrative     ROS: [x]  Positive   [ ]  Negative   [ ]  All sytems reviewed and are negative  Cardiovascular: []  chest pain/pressure []  palpitations []  SOB lying flat []  DOE []  pain in legs while walking []  pain in feet when lying flat []  hx of DVT []  hx of phlebitis []  swelling in legs []  varicose veins  Pulmonary: []  productive cough []  asthma []  wheezing  Neurologic: []  weakness in []  arms []  legs [x]  numbness in [x]  arms while sleeping-positional []  legs [x] difficulty speaking or slurred speech []  temporary loss of vision in one eye []  dizziness [x]  hx CVA  in 2011  Hematologic: [x]  bleeding problems-bleeds a little longer on Plavix []  problems with blood clotting easily  GI []  vomiting blood []  blood in stool  GU: []  burning with urination []  blood in urine  Psychiatric: []  hx of major depression  Integumentary: []  rashes []  ulcers  Constitutional: []  fever []  chills   PHYSICAL EXAMINATION:  Filed Vitals:   07/30/14 1445  BP: 132/87  Pulse: 70  Resp:    Body mass index is 33.75 kg/(m^2).  General:  WDWN in NAD Gait: Normal HENT: WNL; normocephalic Pulmonary: normal non-labored breathing , without Rales, rhonchi,  wheezing Cardiac: regular, without  Murmurs, rubs or gallops; without carotid bruits Abdomen: soft, NT, no masses Skin: without rashes,  without ulcers  Vascular Exam/Pulses:  Right Left  Radial 2+ (normal) 2+ (normal)  Ulnar Unable to palpate  Unable to palpate   DP 2+ (normal) 2+ (normal)   Extremities: without  ischemic changes, without Gangrene , without cellulitis; without open wounds;  Musculoskeletal: without muscle wasting or atrophy  Neurologic: A&O X 3; Appropriate Affect ; SENSATION: normal; MOTOR FUNCTION:  moving all extremities equally. Speech is fluent/normal   Non-Invasive Vascular Imaging: Carotid Duplex Scan:  07/30/2014  1.  Right ICA stenosis present in the 40%-59% range 2.  Left ICA known occlusion with minimal reconstitution of flow present at the mid and distal segment  **Essentially unchanged since study on 06/19/13  ASSESSMENT: 64 y.o. male here for f/u carotid duplex scan.   PLAN: -pt is doing well.  He does still have very mild speech difficulties that is residual from his stroke in 2011.  He has otherwise been asymptomatic.   -He will f/u in 6 months with a carotid ultrasound -he does have a known occlusion of the left ICA  -he and his daughter are given the S/Sx of stroke and instructed to go to the ER if he has any of these symptoms.  They expressed understanding.   Leontine Locket, PA-C [facility] [phone]  Clinic MD:   Pt seen and examined in conjunction with Dr. Oneida Alar  History and exam findings as above. The patient will follow-up with repeat carotid duplex exam in 6 months time.  Ruta Hinds, MD [facility] [place] Office: [phone] Pager: [phone]

## 2015-02-03 ENCOUNTER — Encounter: Payer: Self-pay | Admitting: Vascular Surgery

## 2015-02-04 ENCOUNTER — Ambulatory Visit: Payer: Self-pay | Admitting: Vascular Surgery

## 2015-02-04 ENCOUNTER — Encounter (HOSPITAL_COMMUNITY): Payer: Self-pay

## 2015-03-02 ENCOUNTER — Encounter: Payer: Self-pay | Admitting: Vascular Surgery

## 2015-03-04 ENCOUNTER — Ambulatory Visit: Payer: Self-pay | Admitting: Vascular Surgery

## 2015-03-04 ENCOUNTER — Encounter (HOSPITAL_COMMUNITY): Payer: Self-pay

## 2015-03-16 ENCOUNTER — Encounter: Payer: Self-pay | Admitting: Vascular Surgery

## 2015-03-17 ENCOUNTER — Ambulatory Visit (INDEPENDENT_AMBULATORY_CARE_PROVIDER_SITE_OTHER): Payer: Self-pay | Admitting: Vascular Surgery

## 2015-03-17 ENCOUNTER — Ambulatory Visit (HOSPITAL_COMMUNITY)
Admission: RE | Admit: 2015-03-17 | Discharge: 2015-03-17 | Disposition: A | Discharge status: Home / Self Care | Payer: Self-pay | Source: Ambulatory Visit | Attending: Vascular Surgery | Admitting: Vascular Surgery

## 2015-03-17 ENCOUNTER — Encounter: Payer: Self-pay | Admitting: Vascular Surgery

## 2015-03-17 VITALS — BP 131/77 | HR 77 | Temp 98.0°F | Resp 16 | Ht 67.0 in | Wt 205.0 lb

## 2015-03-17 DIAGNOSIS — I6523 Occlusion and stenosis of bilateral carotid arteries: Secondary | ICD-10-CM | POA: Insufficient documentation

## 2015-03-17 DIAGNOSIS — I6521 Occlusion and stenosis of right carotid artery: Secondary | ICD-10-CM

## 2015-03-17 DIAGNOSIS — I6522 Occlusion and stenosis of left carotid artery: Secondary | ICD-10-CM

## 2015-03-17 DIAGNOSIS — I10 Essential (primary) hypertension: Secondary | ICD-10-CM | POA: Insufficient documentation

## 2015-03-17 NOTE — Progress Notes (Signed)
Patient is a 64 year old male who returns for followup today. He was originally seen in 2012 after a stroke. He had a left internal carotid artery occlusion at that time. He had a moderate right internal carotid artery stenosis. He is currently on aspirin and Plavix. He continues to deny any symptoms of stroke TIA or amaurosis. Other chronic medical problems include hypertension. He is also on a statin.    Past Medical History   Diagnosis  Date   .  Stroke     .  Hypertension     .  Atherosclerosis of arteries     .  Carotid artery occlusion      Past Medical History  Diagnosis Date  . Stroke ([place])   . Hypertension   . Atherosclerosis of arteries   . Carotid artery occlusion    Past Surgical History  Procedure Laterality Date  . Foot surgery      Review of systems: He denies shortness of breath. He denies chest pain.  Physical exam:    Filed Vitals:   03/17/15 1557 03/17/15 1600 03/17/15 1607  BP: 127/91 122/87 131/77  Pulse: 80 77 77  Temp:  98 F (36.7 C)   TempSrc:  Oral   Resp:  16   Height:  '5\' 7"'$  (1.702 m)   Weight:  205 lb (92.987 kg)   SpO2:  96%     Neck: No carotid bruits  Chest: Clear to auscultation bilaterally  Cardiac: Regular rate and rhythm  Neuro: Symmetric upper and lower extremity motor strength 5 over 5, no facial asymmetry  Data: Patient had carotid duplex exam today which I reviewed and interpreted. 40-60% right internal carotid artery stenosis no significant change chronic left internal carotid artery occlusion, bilateral antegrade vertebral flow  Assessment: Moderate asymptomatic right internal carotid artery stenosis.  He has had no change in his carotid duplex exam over the last 4 years.  Plan: Continue Plavix and aspirin followup carotid duplex scan in 1 year  Ruta Hinds, MD [facility] [place] Office: [phone] Pager: [phone]

## 2015-03-17 NOTE — Progress Notes (Signed)
Filed Vitals:   03/17/15 1557 03/17/15 1600 03/17/15 1607  BP: 127/91 122/87 131/77  Pulse: 80 77 77  Temp:  98 F (36.7 C)   TempSrc:  Oral   Resp:  16   Height:  '5\' 7"'$  (1.702 m)   Weight:  205 lb (92.987 kg)   SpO2:  96%

## 2015-03-23 NOTE — Addendum Note (Signed)
Addended by: Dorthula Rue L on: 03/23/2015 04:01 PM   Modules accepted: Orders

## 2016-03-21 ENCOUNTER — Encounter: Payer: Self-pay | Admitting: Vascular Surgery

## 2016-03-23 ENCOUNTER — Encounter: Payer: Self-pay | Admitting: Vascular Surgery

## 2016-03-23 ENCOUNTER — Ambulatory Visit (HOSPITAL_COMMUNITY)
Admission: RE | Admit: 2016-03-23 | Discharge: 2016-03-23 | Disposition: A | Discharge status: Home / Self Care | Payer: Self-pay | Source: Ambulatory Visit | Attending: Vascular Surgery | Admitting: Vascular Surgery

## 2016-03-23 ENCOUNTER — Ambulatory Visit (INDEPENDENT_AMBULATORY_CARE_PROVIDER_SITE_OTHER): Payer: Self-pay | Admitting: Vascular Surgery

## 2016-03-23 VITALS — BP 127/85 | HR 76 | Temp 98.2°F | Resp 18 | Ht 67.0 in | Wt 196.5 lb

## 2016-03-23 DIAGNOSIS — I6521 Occlusion and stenosis of right carotid artery: Secondary | ICD-10-CM | POA: Insufficient documentation

## 2016-03-23 DIAGNOSIS — I6523 Occlusion and stenosis of bilateral carotid arteries: Secondary | ICD-10-CM

## 2016-03-23 LAB — VAS US CAROTID
LCCADDIAS: 15 cm/s
LCCADSYS: 74 cm/s
LCCAPDIAS: 14 cm/s
LEFT ECA DIAS: -20 cm/s
LEFT VERTEBRAL DIAS: -15 cm/s
LICADDIAS: -5 cm/s
Left CCA prox sys: 115 cm/s
Left ICA dist sys: -17 cm/s
RCCADSYS: -81 cm/s
RCCAPSYS: 114 cm/s
RIGHT CCA MID DIAS: 26 cm/s
RIGHT ECA DIAS: -17 cm/s
RIGHT VERTEBRAL DIAS: -18 cm/s
Right CCA prox dias: 28 cm/s

## 2016-03-23 NOTE — Progress Notes (Signed)
Patient is a 65 year old male who returns for followup today. He was originally seen in 2012 after a stroke. He had a left internal carotid artery occlusion at that time. He had a moderate right internal carotid artery stenosis. He is currently on aspirin and Plavix. He continues to deny any symptoms of stroke TIA or amaurosis. Other chronic medical problems include hypertension. He is also on a statin.       Past Medical History   Diagnosis  Date   .  Stroke     .  Hypertension     .  Atherosclerosis of arteries     .  Carotid artery occlusion           Past Medical History  Diagnosis Date  . Stroke ([place])    . Hypertension    . Atherosclerosis of arteries    . Carotid artery occlusion           Past Surgical History  Procedure Laterality Date  . Foot surgery          Review of systems: He denies shortness of breath. He denies chest pain.   Physical exam:    Vitals:   03/23/16 1552 03/23/16 1553  BP: 139/85 127/85  Pulse: 76   Resp: 18   Temp: 98.2 F (36.8 C)   TempSrc: Oral   SpO2: 96%   Weight: 196 lb 8 oz (89.1 kg)   Height: '5\' 7"'$  (1.702 m)      Neck: No carotid bruits   Chest: Clear to auscultation bilaterally   Cardiac: Regular rate and rhythm  Abdomen: Soft nontender no pulsatile mass   Neuro: Symmetric upper and lower extremity motor strength 5 over 5, no facial asymmetry   Data: Patient had carotid duplex exam today which I reviewed and interpreted. 40-60% right internal carotid artery stenosis no significant change chronic left internal carotid artery occlusion, bilateral antegrade vertebral flow   Assessment: Moderate asymptomatic right internal carotid artery stenosis.  He has had no change in his carotid duplex exam over the last 5 years.   Plan: Continue Plavix and aspirin followup carotid duplex scan in 1 year.  Follow-up with our nurse practitioner in one year.    Ruta Hinds, MD [facility] [place] Office:  [phone] Pager: [phone]

## 2016-04-18 NOTE — Addendum Note (Signed)
Addended by: Lianne Cure A on: 04/18/2016 03:04 PM   Modules accepted: Orders

## 2017-03-29 ENCOUNTER — Ambulatory Visit (INDEPENDENT_AMBULATORY_CARE_PROVIDER_SITE_OTHER): Payer: Self-pay | Admitting: Family

## 2017-03-29 ENCOUNTER — Ambulatory Visit (HOSPITAL_COMMUNITY)
Admission: RE | Admit: 2017-03-29 | Discharge: 2017-03-29 | Disposition: A | Discharge status: Home / Self Care | Payer: Self-pay | Source: Ambulatory Visit | Attending: Vascular Surgery | Admitting: Vascular Surgery

## 2017-03-29 ENCOUNTER — Encounter: Payer: Self-pay | Admitting: Family

## 2017-03-29 VITALS — BP 121/75 | HR 59 | Temp 96.9°F | Resp 18 | Ht 66.0 in | Wt 185.0 lb

## 2017-03-29 DIAGNOSIS — I6523 Occlusion and stenosis of bilateral carotid arteries: Secondary | ICD-10-CM

## 2017-03-29 DIAGNOSIS — Z8673 Personal history of transient ischemic attack (TIA), and cerebral infarction without residual deficits: Secondary | ICD-10-CM

## 2017-03-29 DIAGNOSIS — I6522 Occlusion and stenosis of left carotid artery: Secondary | ICD-10-CM

## 2017-03-29 DIAGNOSIS — I6521 Occlusion and stenosis of right carotid artery: Secondary | ICD-10-CM

## 2017-03-29 LAB — VAS US CAROTID
LCCADSYS: -64 cm/s
LCCAPDIAS: 16 cm/s
LEFT ECA DIAS: -28 cm/s
LEFT VERTEBRAL DIAS: 21 cm/s
LICAPDIAS: -41 cm/s
Left CCA dist dias: -19 cm/s
Left CCA prox sys: 94 cm/s
Left ICA dist dias: -17 cm/s
Left ICA dist sys: -72 cm/s
Left ICA prox sys: -101 cm/s
RCCAPDIAS: 28 cm/s
RCCAPSYS: 90 cm/s
RIGHT CCA MID DIAS: 26 cm/s
RIGHT ECA DIAS: -30 cm/s
RIGHT VERTEBRAL DIAS: 17 cm/s
Right cca dist sys: 117 cm/s

## 2017-03-29 NOTE — Patient Instructions (Signed)
Stroke Prevention Some medical conditions and behaviors are associated with an increased chance of having a stroke. You may prevent a stroke by making healthy choices and managing medical conditions. How can I reduce my risk of having a stroke?  Stay physically active. Get at least 30 minutes of activity on most or all days.  Do not smoke. It may also be helpful to avoid exposure to secondhand smoke.  Limit alcohol use. Moderate alcohol use is considered to be:  No more than 2 drinks per day for men.  No more than 1 drink per day for nonpregnant women.  Eat healthy foods. This involves:  Eating 5 or more servings of fruits and vegetables a day.  Making dietary changes that address high blood pressure (hypertension), high cholesterol, diabetes, or obesity.  Manage your cholesterol levels.  Making food choices that are high in fiber and low in saturated fat, trans fat, and cholesterol may control cholesterol levels.  Take any prescribed medicines to control cholesterol as directed by your health care provider.  Manage your diabetes.  Controlling your carbohydrate and sugar intake is recommended to manage diabetes.  Take any prescribed medicines to control diabetes as directed by your health care provider.  Control your hypertension.  Making food choices that are low in salt (sodium), saturated fat, trans fat, and cholesterol is recommended to manage hypertension.  Ask your health care provider if you need treatment to lower your blood pressure. Take any prescribed medicines to control hypertension as directed by your health care provider.  If you are 18-39 years of age, have your blood pressure checked every 3-5 years. If you are 40 years of age or older, have your blood pressure checked every year.  Maintain a healthy weight.  Reducing calorie intake and making food choices that are low in sodium, saturated fat, trans fat, and cholesterol are recommended to manage  weight.  Stop drug abuse.  Avoid taking birth control pills.  Talk to your health care provider about the risks of taking birth control pills if you are over 35 years old, smoke, get migraines, or have ever had a blood clot.  Get evaluated for sleep disorders (sleep apnea).  Talk to your health care provider about getting a sleep evaluation if you snore a lot or have excessive sleepiness.  Take medicines only as directed by your health care provider.  For some people, aspirin or blood thinners (anticoagulants) are helpful in reducing the risk of forming abnormal blood clots that can lead to stroke. If you have the irregular heart rhythm of atrial fibrillation, you should be on a blood thinner unless there is a good reason you cannot take them.  Understand all your medicine instructions.  Make sure that other conditions (such as anemia or atherosclerosis) are addressed. Get help right away if:  You have sudden weakness or numbness of the face, arm, or leg, especially on one side of the body.  Your face or eyelid droops to one side.  You have sudden confusion.  You have trouble speaking (aphasia) or understanding.  You have sudden trouble seeing in one or both eyes.  You have sudden trouble walking.  You have dizziness.  You have a loss of balance or coordination.  You have a sudden, severe headache with no known cause.  You have new chest pain or an irregular heartbeat. Any of these symptoms may represent a serious problem that is an emergency. Do not wait to see if the symptoms will go away.   Get medical help at once. Call your local emergency services (911 in [place] Do not drive yourself to the hospital. This information is not intended to replace advice given to you by your health care provider. Make sure you discuss any questions you have with your health care provider. Document Released: 06/22/2004 Document Revised: 10/21/2015 Document Reviewed: 11/15/2012 Elsevier  Interactive Patient Education  2017 [employer]  

## 2017-03-29 NOTE — Progress Notes (Signed)
Chief Complaint: Follow up Extracranial Carotid Artery Stenosis   History of Present Illness  Alex [name] is a 66 y.o. male who returns for followup today. He was originally seen in 2012 after a stroke. He had a left internal carotid artery occlusion at that time. He had a moderate right internal carotid artery stenosis. He is currently on aspirin and Plavix. He continues to deny any symptoms of stroke TIA or amaurosis. Other chronic medical problems include hypertension. He is also on a statin.  He denies any residual neurological deficits.   Dr. Oneida Alar last evaluated pt on 03-23-16. At that time pt had moderate asymptomatic right internal carotid artery stenosis. He had no change in his carotid duplex exam over the last 5 years. Continue Plavix and aspirin, followup carotid duplex scan in 1 year.  He works full time, delivers parts, walks a great deal, and also walks his dog for an hour daily.   He denies claudication sx's with walking.   Pt Diabetic: no Pt smoker: non-smoker  Pt meds include: Statin : yes ASA: yes Other anticoagulants/antiplatelets: Plavix   Past Medical History:  Diagnosis Date  . Atherosclerosis of arteries   . Carotid artery occlusion   . Hypertension   . Stroke [facility])     Social History Social History  Substance Use Topics  . Smoking status: Never Smoker  . Smokeless tobacco: Never Used  . Alcohol use No    Family History Family History  Problem Relation Age of Onset  . Heart disease Other   . Hypertension Father   . Stroke Father   . Heart disease Father   . Diabetes Sister   . Diabetes Brother     Surgical History Past Surgical History:  Procedure Laterality Date  . FOOT SURGERY      No Known Allergies  Current Outpatient Prescriptions  Medication Sig Dispense Refill  . AMLODIPINE BESYLATE PO Take 10 mg by mouth daily.    Marland Kitchen aspirin 325 MG tablet Take 81 mg by mouth daily.     Marland Kitchen aspirin 81 MG tablet Take 81 mg by mouth  daily.    . clopidogrel (PLAVIX) 75 MG tablet Take 75 mg by mouth daily.    Marland Kitchen HYDRALAZINE HCL PO Take by mouth.    . hydrochlorothiazide (HYDRODIURIL) 25 MG tablet Take 25 mg by mouth daily.    Marland Kitchen LORazepam (ATIVAN PO) Take by mouth.    . simvastatin (ZOCOR) 10 MG tablet Take 20 mg by mouth at bedtime.      No current facility-administered medications for this visit.     Review of Systems : See HPI for pertinent positives and negatives.  Physical Examination  Vitals:   03/29/17 1156 03/29/17 1158  BP: 119/74 121/75  Pulse: 60 (!) 59  Resp: 18   Temp: (!) 96.9 F (36.1 C)   SpO2: 96%   Weight: 185 lb (83.9 kg)   Height: 5\' 6"  (1.676 m)    Body mass index is 29.86 kg/m.   General: WDWN male in NAD GAIT: normal Eyes: PERRLA Pulmonary:  Respirations are non-labored, good air movement, CTAB, no rales, rhonchi, or wheezing.  Cardiac: regular rhythm,  no detected murmur.  VASCULAR EXAM Carotid Bruits Right Left   Negative Negative     Abdominal aortic pulse is not palpable. Radial pulses are 2+ palpable and equal.  LE Pulses Right Left       POPLITEAL  not palpable   not palpable       POSTERIOR TIBIAL   palpable    palpable        DORSALIS PEDIS      ANTERIOR TIBIAL  palpable   palpable     Gastrointestinal: soft, nontender, BS WNL, no r/g, no palpable masses.  Musculoskeletal: No muscle atrophy/wasting. M/S 5/5 throughout, extremities without ischemic changes.  Skin: No rashes, no ulcers, no cellulitis.    Neurologic:  A&O X 3; appropriate affect, sensation is normal; speech is normal, CN 2-12 intact, pain and light touch intact in extremities, motor exam as listed above.     Assessment: Aeric Burnham [name] is a 66 y.o. male who had a stroke in 2012, had a left ICA occlusion at that time; no residual neurological deficits, no subsequent  stroke or TIA.  He takes a statin, ASA, and Plavix. He walks a great deal, works full time.  DATA Carotid Duplex (03/29/17): Right ICA: 60-79% stenosis. Left ICA: confirmed occlusion.  Bilateral vertebral artery flow is antegrade.  Bilateral subclavian artery waveforms are normal.  Progression of disease in right ICA compared to the exam on 03-23-16, confirmed left ICA occlusion.    Plan: Follow-up in 6 months with Carotid Duplex scan.   I discussed in depth with the patient the nature of atherosclerosis, and emphasized the importance of maximal medical management including strict control of blood pressure, blood glucose, and lipid levels, obtaining regular exercise, and continued cessation of smoking.  The patient is aware that without maximal medical management the underlying atherosclerotic disease process will progress, limiting the benefit of any interventions. The patient was given information about stroke prevention and what symptoms should prompt the patient to seek immediate medical care. Thank you for allowing [place] to participate in this patient's care.  Clemon Chambers, RN, MSN, FNP-C [facility] Office: [phone]  Clinic Physician: Oneida Alar  03/29/17 12:07 PM

## 2017-04-12 NOTE — Addendum Note (Signed)
Addended by: Lianne Cure A on: 04/12/2017 10:39 AM   Modules accepted: Orders

## 2017-05-29 DIAGNOSIS — C187 Malignant neoplasm of sigmoid colon: Secondary | ICD-10-CM

## 2017-05-29 HISTORY — DX: Malignant neoplasm of sigmoid colon: C18.7

## 2017-08-04 ENCOUNTER — Observation Stay (HOSPITAL_COMMUNITY)
Admission: EM | Admit: 2017-08-04 | Discharge: 2017-08-05 | Disposition: A | Discharge status: Home / Self Care | Payer: Self-pay | Attending: Family Medicine | Admitting: Family Medicine

## 2017-08-04 ENCOUNTER — Other Ambulatory Visit: Payer: Self-pay

## 2017-08-04 ENCOUNTER — Encounter (HOSPITAL_COMMUNITY): Payer: Self-pay | Admitting: *Deleted

## 2017-08-04 ENCOUNTER — Emergency Department (HOSPITAL_COMMUNITY): Payer: Self-pay

## 2017-08-04 DIAGNOSIS — J189 Pneumonia, unspecified organism: Secondary | ICD-10-CM

## 2017-08-04 DIAGNOSIS — Z7982 Long term (current) use of aspirin: Secondary | ICD-10-CM | POA: Insufficient documentation

## 2017-08-04 DIAGNOSIS — Z79899 Other long term (current) drug therapy: Secondary | ICD-10-CM | POA: Insufficient documentation

## 2017-08-04 DIAGNOSIS — I6529 Occlusion and stenosis of unspecified carotid artery: Secondary | ICD-10-CM | POA: Insufficient documentation

## 2017-08-04 DIAGNOSIS — Z8673 Personal history of transient ischemic attack (TIA), and cerebral infarction without residual deficits: Secondary | ICD-10-CM

## 2017-08-04 DIAGNOSIS — J181 Lobar pneumonia, unspecified organism: Secondary | ICD-10-CM | POA: Insufficient documentation

## 2017-08-04 DIAGNOSIS — Z7902 Long term (current) use of antithrombotics/antiplatelets: Secondary | ICD-10-CM | POA: Insufficient documentation

## 2017-08-04 DIAGNOSIS — I10 Essential (primary) hypertension: Secondary | ICD-10-CM | POA: Diagnosis present

## 2017-08-04 HISTORY — DX: Personal history of transient ischemic attack (TIA), and cerebral infarction without residual deficits: Z86.73

## 2017-08-04 HISTORY — DX: Pneumonia, unspecified organism: J18.9

## 2017-08-04 LAB — BASIC METABOLIC PANEL
Anion gap: 13 (ref 5–15)
BUN: 13 mg/dL (ref 6–20)
CHLORIDE: 98 mmol/L — AB (ref 101–111)
CO2: 22 mmol/L (ref 22–32)
Calcium: 8.6 mg/dL — ABNORMAL LOW (ref 8.9–10.3)
Creatinine, Ser: 0.69 mg/dL (ref 0.61–1.24)
GFR calc Af Amer: >60 mL/min (ref >60)
GFR calc non Af Amer: >60 mL/min (ref >60)
Glucose, Bld: 149 mg/dL — ABNORMAL HIGH (ref 65–99)
POTASSIUM: 3.8 mmol/L (ref 3.5–5.1)
SODIUM: 133 mmol/L — AB (ref 135–145)

## 2017-08-04 LAB — CBC WITH DIFFERENTIAL/PLATELET
BLASTS: 0 %
Band Neutrophils: 0 %
Basophils Absolute: 0 10*3/uL (ref 0.0–0.1)
Basophils Relative: 0 %
Eosinophils Absolute: 0 10*3/uL (ref 0.0–0.7)
Eosinophils Relative: 0 %
HEMATOCRIT: 40 % (ref 39.0–52.0)
HEMOGLOBIN: 13.7 g/dL (ref 13.0–17.0)
Lymphocytes Relative: 6 %
Lymphs Abs: 1.9 10*3/uL (ref 0.7–4.0)
MCH: 28.8 pg (ref 26.0–34.0)
MCHC: 34.3 g/dL (ref 30.0–36.0)
MCV: 84.2 fL (ref 78.0–100.0)
MYELOCYTES: 0 %
Metamyelocytes Relative: 0 %
Monocytes Absolute: 0.3 10*3/uL (ref 0.1–1.0)
Monocytes Relative: 1 %
NRBC: 0 /100{WBCs}
Neutro Abs: 30.2 10*3/uL — ABNORMAL HIGH (ref 1.7–7.7)
Neutrophils Relative %: 93 %
OTHER: 0 %
PROMYELOCYTES ABS: 0 %
Platelets: 686 10*3/uL — ABNORMAL HIGH (ref 150–400)
RBC: 4.75 MIL/uL (ref 4.22–5.81)
RDW: 12.8 % (ref 11.5–15.5)
WBC: 32.4 10*3/uL — ABNORMAL HIGH (ref 4.0–10.5)

## 2017-08-04 MED ORDER — ENOXAPARIN SODIUM 40 MG/0.4ML ~~LOC~~ SOLN
40.0000 mg | SUBCUTANEOUS | Status: DC
  Administered 2017-08-04: 40 mg via SUBCUTANEOUS
  Filled 2017-08-04 (×2): qty 0.4

## 2017-08-04 MED ORDER — CLOPIDOGREL BISULFATE 75 MG PO TABS: 75.0000 mg | ORAL_TABLET | Freq: Every day | ORAL | Status: DC

## 2017-08-04 MED ORDER — SIMVASTATIN 20 MG PO TABS: 20.0000 mg | ORAL_TABLET | Freq: Every day | ORAL | Status: DC

## 2017-08-04 MED ORDER — SODIUM CHLORIDE 0.9 % IV SOLN
1.0000 g | INTRAVENOUS | Status: DC
  Filled 2017-08-04: qty 10

## 2017-08-04 MED ORDER — IOPAMIDOL (ISOVUE-370) INJECTION 76%
INTRAVENOUS | Status: AC
  Administered 2017-08-04: 100 mL
  Filled 2017-08-04: qty 100

## 2017-08-04 MED ORDER — AZITHROMYCIN 500 MG IV SOLR
500.0000 mg | Freq: Once | INTRAVENOUS | Status: AC
  Administered 2017-08-04: 500 mg via INTRAVENOUS
  Filled 2017-08-04: qty 500

## 2017-08-04 MED ORDER — ASPIRIN EC 81 MG PO TBEC: 81.0000 mg | DELAYED_RELEASE_TABLET | Freq: Every day | ORAL | Status: DC

## 2017-08-04 MED ORDER — PREDNISONE 20 MG PO TABS: 40.0000 mg | ORAL_TABLET | Freq: Once | ORAL | Status: DC

## 2017-08-04 MED ORDER — PREDNISONE 20 MG PO TABS: 30.0000 mg | ORAL_TABLET | Freq: Once | ORAL | Status: DC

## 2017-08-04 MED ORDER — HYDROCHLOROTHIAZIDE 25 MG PO TABS: 25.0000 mg | ORAL_TABLET | Freq: Every day | ORAL | Status: DC

## 2017-08-04 MED ORDER — PREDNISONE 20 MG PO TABS: 20.0000 mg | ORAL_TABLET | Freq: Once | ORAL | Status: DC

## 2017-08-04 MED ORDER — SODIUM CHLORIDE 0.9 % IV SOLN
1.0000 g | Freq: Once | INTRAVENOUS | Status: AC
  Administered 2017-08-04: 1 g via INTRAVENOUS
  Filled 2017-08-04: qty 10

## 2017-08-04 MED ORDER — PREDNISONE 10 MG PO TABS: 10.0000 mg | ORAL_TABLET | Freq: Once | ORAL | Status: DC

## 2017-08-04 MED ORDER — AMLODIPINE BESYLATE 10 MG PO TABS: 10.0000 mg | ORAL_TABLET | Freq: Every day | ORAL | Status: DC

## 2017-08-04 MED ORDER — AZITHROMYCIN 500 MG PO TABS: 500.0000 mg | ORAL_TABLET | ORAL | Status: DC

## 2017-08-04 NOTE — ED Triage Notes (Addendum)
Pt told to by PCP in [place] to come to ED for chest xray due to hearing 'crackles'. Pt denies sob, denies cp, denies productive cough. No script for xray given. Appears in nad. Pt does sound diminished on right side

## 2017-08-04 NOTE — ED Notes (Signed)
ED Provider at bedside. 

## 2017-08-04 NOTE — ED Notes (Signed)
Pulse oximetry sitting 99% RA, 97% walking around department.

## 2017-08-04 NOTE — ED Provider Notes (Signed)
[facility] EMERGENCY DEPARTMENT Provider Note   CSN: [id number] Arrival date & time: 08/04/17  1314     History   Chief Complaint Chief Complaint  Patient presents with  . needs xray    HPI Alex [name] is a 67 y.o. male history of stroke and hypertension and is here for chest x-ray as recommended by PCP. States PCP heard crackles in right lung and wants him to get an x-ray. Reports very mild intermittent cough and sweats "every now and then" for about a week. Recently noticing achy pain to right thoracic back worse with movement, non pleuritic non exertional. Denies CP, SOB. No h/o DVT/PE, recent immobilization, LE edema or calf pain, tobacco abuse, COPD, asthma. No intervention PTA.   HPI  Past Medical History:  Diagnosis Date  . Atherosclerosis of arteries   . Carotid artery occlusion   . Hypertension   . Stroke [facility])     Patient Active Problem List   Diagnosis Date Noted  . Occlusion and stenosis of carotid artery without mention of cerebral infarction 09/07/2011    Past Surgical History:  Procedure Laterality Date  . FOOT SURGERY         Home Medications    Prior to Admission medications   Medication Sig Start Date End Date Taking? Authorizing Provider  AMLODIPINE BESYLATE PO Take 10 mg by mouth daily.    [provider]  aspirin 325 MG tablet Take 81 mg by mouth daily.     [provider]  aspirin 81 MG tablet Take 81 mg by mouth daily.    [provider]  clopidogrel (PLAVIX) 75 MG tablet Take 75 mg by mouth daily.    [provider]  HYDRALAZINE HCL PO Take by mouth.    [provider]  hydrochlorothiazide (HYDRODIURIL) 25 MG tablet Take 25 mg by mouth daily.    [provider]  LORazepam (ATIVAN PO) Take by mouth.    [provider]  simvastatin (ZOCOR) 10 MG tablet Take 20 mg by mouth at bedtime.     [provider]    Family History Family History  Problem  Relation Age of Onset  . Heart disease Other   . Hypertension Father   . Stroke Father   . Heart disease Father   . Diabetes Sister   . Diabetes Brother     Social History Social History   Tobacco Use  . Smoking status: Never Smoker  . Smokeless tobacco: Never Used  Substance Use Topics  . Alcohol use: No    Alcohol/week: 0.0 oz  . Drug use: No     Allergies   Patient has no known allergies.   Review of Systems Review of Systems  Constitutional: Positive for diaphoresis.  Respiratory: Positive for cough.   Musculoskeletal: Positive for back pain.  All other systems reviewed and are negative.    Physical Exam Updated Vital Signs BP 112/69 (BP Location: Right Arm)   Pulse 79   Temp 97.7 F (36.5 C) (Oral)   Resp 14   SpO2 98%   Physical Exam  Constitutional: He is oriented to person, place, and time. He appears well-developed and well-nourished. No distress.  Non toxic.  HENT:  Head: Normocephalic and atraumatic.  Nose: Nose normal.  Mouth/Throat: No oropharyngeal exudate.  Moist mucous membranes   Eyes: Conjunctivae and EOM are normal. Pupils are equal, round, and reactive to light.  Neck: Normal range of motion.  Cardiovascular: Normal rate, regular  rhythm, normal heart sounds and intact distal pulses.  No murmur heard. 2+ DP and radial pulses bilaterally. No LE edema.   Pulmonary/Chest: Effort normal. He has decreased breath sounds in the right middle field and the right lower field.  Abdominal: Soft. Bowel sounds are normal. There is no tenderness.  No G/R/R. No suprapubic or CVA tenderness.   Musculoskeletal: Normal range of motion. He exhibits no deformity.  Neurological: He is alert and oriented to person, place, and time.  Skin: Skin is warm and dry. Capillary refill takes less than 2 seconds.  Psychiatric: He has a normal mood and affect. His behavior is normal. Judgment and thought content normal.  Nursing note and vitals reviewed.    ED  Treatments / Results  Labs (all labs ordered are listed, but only abnormal results are displayed) Labs Reviewed  CBC WITH DIFFERENTIAL/PLATELET - Abnormal; Notable for the following components:      Result Value   WBC 32.4 (*)    Platelets 686 (*)    Neutro Abs 30.2 (*)    All other components within normal limits  BASIC METABOLIC PANEL - Abnormal; Notable for the following components:   Sodium 133 (*)    Chloride 98 (*)    Glucose, Bld 149 (*)    Calcium 8.6 (*)    All other components within normal limits  CULTURE, BLOOD (ROUTINE X 2)  CULTURE, BLOOD (ROUTINE X 2)    EKG  EKG Interpretation None       Radiology Dg Chest 2 View  Result Date: 08/04/2017 CLINICAL DATA:  Crackles and right lung. EXAM: CHEST - 2 VIEW COMPARISON:  Chest x-ray dated 02/15/2011. FINDINGS: Dense opacity at the right lung base, suspicious for pneumonia and adjacent pleural effusion. Left lung is clear. Heart size is upper normal. No acute or suspicious osseous finding. IMPRESSION: Dense opacity at the right lung base, suspicious for pneumonia. Some component of the opacity is consistent with pleural effusion, small to moderate in size. Recommend follow-up chest x-ray to ensure resolution. Electronically Signed   By: Franki Cabot M.D.   On: 08/04/2017 14:26   Ct Angio Chest Pe W And/or Wo Contrast  Result Date: 08/04/2017 CLINICAL DATA:  Pleural effusion. EXAM: CT ANGIOGRAPHY CHEST WITH CONTRAST TECHNIQUE: Multidetector CT imaging of the chest was performed using the standard protocol during bolus administration of intravenous contrast. Multiplanar CT image reconstructions and MIPs were obtained to evaluate the vascular anatomy. CONTRAST:  14mL ISOVUE-370 IOPAMIDOL (ISOVUE-370) INJECTION 76% COMPARISON:  Chest radiograph from earlier today. FINDINGS: Cardiovascular: The study is high quality for the evaluation of pulmonary embolism. There are no filling defects in the central, lobar, segmental or  subsegmental pulmonary artery branches to suggest acute pulmonary embolism. Mildly atherosclerotic nonaneurysmal thoracic aorta. Top-normal caliber main pulmonary artery (3.1 cm diameter). Top-normal heart size. No significant pericardial fluid/thickening. Left anterior descending and right coronary atherosclerosis. Mediastinum/Nodes: No discrete thyroid nodules. Unremarkable esophagus. No pathologically enlarged axillary, mediastinal or hilar lymph nodes. Lungs/Pleura: No pneumothorax. Small dependent right pleural effusion with associated slightly irregular contour suggesting loculation. Mild right pleural thickening and enhancement. No left pleural effusion. Dense consolidation and volume loss in the right lower lobe with some air bronchograms. Tiny 2 mm right upper lobe solid pulmonary nodule (series 7/image 26). No discrete lung mass. No central airway stenosis. Clear left lung. No additional significant pulmonary nodules. Upper abdomen: No acute abnormality. Musculoskeletal: No aggressive appearing focal osseous lesions. Mild thoracic spondylosis. Review of the MIP images confirms  the above findings. IMPRESSION: 1. No pulmonary embolism. 2. Nonspecific dense consolidation with air bronchograms and volume loss in the right lower lobe. Findings suggest atelectasis with a component of right lower lobe pneumonia not excluded. Follow-up chest imaging is recommended to document resolution of this consolidation. 3. Small dependent right pleural effusion with slightly irregular contour and mild right pleural thickening and enhancement. Findings are compatible with a loculated exudative effusion. 4. Tiny 2 mm right upper lobe pulmonary nodule. No follow-up needed if patient is low-risk. Non-contrast chest CT can be considered in 12 months if patient is high-risk. This recommendation follows the consensus statement: Guidelines for Management of Incidental Pulmonary Nodules Detected on CT Images:From the Fleischner  Society 2017; published online before print (10.1148/[id number]). 5. Two-vessel coronary atherosclerosis. Aortic Atherosclerosis (ICD10-I70.0). Electronically Signed   By: Ilona Sorrel M.D.   On: 08/04/2017 18:24    Procedures Procedures (including critical care time)  Medications Ordered in ED Medications  cefTRIAXone (ROCEPHIN) 1 g in sodium chloride 0.9 % 100 mL IVPB (not administered)  azithromycin (ZITHROMAX) 500 mg in sodium chloride 0.9 % 250 mL IVPB (not administered)  iopamidol (ISOVUE-370) 76 % injection (100 mLs  Contrast Given 08/04/17 1751)     Initial Impression / Assessment and Plan / ED Course  I have reviewed the triage vital signs and the nursing notes.  Pertinent labs & imaging results that were available during my care of the patient were reviewed by me and considered in my medical decision making (see chart for details).  Clinical Course as of Aug 04 1925  Sat Aug 04, 2017  1831 Lungs/Pleura: No pneumothorax. Small dependent right pleural effusion with associated slightly irregular contour suggesting loculation. Mild right pleural thickening and enhancement. No left pleural effusion. Dense consolidation and volume loss in the right lower lobe with some air bronchograms. Tiny 2 mm right upper lobe solid pulmonary nodule (series 7/image 26). No discrete lung mass. No central airway stenosis. Clear left lung. No additional significant pulmonary nodules CT Angio Chest PE W and/or Wo Contrast [CG]  1920 IMPRESSION: 1. No pulmonary embolism. 2. Nonspecific dense consolidation with air bronchograms and volume loss in the right lower lobe. Findings suggest atelectasis with a component of right lower lobe pneumonia not excluded. Follow-up chest imaging is recommended to document resolution of this consolidation. 3. Small dependent right pleural effusion with slightly irregular contour and mild right pleural thickening and enhancement. Findings are compatible  with a loculated exudative effusion. 4. Tiny 2 mm right upper lobe pulmonary nodule. No follow-up needed if patient is low-risk. Non-contrast chest CT can be considered in 12 months if patient is high-risk. This recommendation follows the consensus statement: Guidelines for Management of Incidental Pulmonary Nodules Detected on CT Images:From the Fleischner Society 2017; published online before print (10.1148/radiol.[id number]). 5. Two-vessel coronary atherosclerosis. CT Angio Chest PE W and/or Wo Contrast [CG]    Clinical Course User Index [CG] Kinnie Feil, PA-C    67 year old male with history of stroke here with symptoms of pneumonia. Exam as above reassuring. Labs and imaging reviewed and remarkable for leukocytosis at 32.4. Chest x-ray/CTA shows nonspecific dense consolidation and volume loss to right lower lobe with small right pleural loculated exudative effusion. Given age, size of consolidation and loss of volume pt considered higher risk.    Final Clinical Impressions(s) / ED Diagnoses   Pt will benefit for short observation for community acquired pneumonia and effusion. Pt shared with supervising physician who agrees with ED tx and  plan for observation at this time. Patient and family agreeable.  Final diagnoses:  Community acquired pneumonia of right lower lobe of lung [facility])    ED Discharge Orders    None       Arlean Hopping 08/04/17 [place], Sam, MD 08/04/17 2358

## 2017-08-04 NOTE — H&P (Signed)
History and Physical    Alex [name] OZH:[id number] DOB: 04-Oct-1950 DOA: 08/04/2017  PCP: Antonietta Jewel, MD  Patient coming from: Home  I have personally briefly reviewed patient's old medical records in [place]  Chief Complaint: Cough  HPI: Alex [name] is a 67 y.o. male with medical history significant of Stroke, HTN.  Patient presents to the ED with 1 week history of cough, congestion, fevers.  Went to PCP who heard crackles in R lung, sent to ED for CXR.  Symptoms are constant, not worsening.   ED Course: CT chest shows RLL PNA, WBC 32k.   Review of Systems: As per HPI otherwise 10 point review of systems negative.   Past Medical History:  Diagnosis Date  . Atherosclerosis of arteries   . Carotid artery occlusion   . Hypertension   . Stroke [facility])     Past Surgical History:  Procedure Laterality Date  . FOOT SURGERY       reports that  has never smoked. he has never used smokeless tobacco. He reports that he does not drink alcohol or use drugs.  No Known Allergies  Family History  Problem Relation Age of Onset  . Heart disease Other   . Hypertension Father   . Stroke Father   . Heart disease Father   . Diabetes Sister   . Diabetes Brother      Prior to Admission medications   Medication Sig Start Date End Date Taking? Authorizing Provider  AMLODIPINE BESYLATE PO Take 10 mg by mouth daily.    [provider]  aspirin 325 MG tablet Take 81 mg by mouth daily.     [provider]  aspirin 81 MG tablet Take 81 mg by mouth daily.    [provider]  clopidogrel (PLAVIX) 75 MG tablet Take 75 mg by mouth daily.    [provider]  HYDRALAZINE HCL PO Take by mouth.    [provider]  hydrochlorothiazide (HYDRODIURIL) 25 MG tablet Take 25 mg by mouth daily.    [provider]  LORazepam (ATIVAN PO) Take by mouth.    [provider]  simvastatin (ZOCOR) 10 MG tablet Take 20 mg by mouth at  bedtime.     [provider]    Physical Exam: Vitals:   08/04/17 1326 08/04/17 1523 08/04/17 1846  BP: (!) 146/83 131/78 112/69  Pulse: (!) 107 88 79  Resp: 18 16 14   Temp: 97.7 F (36.5 C) 98 F (36.7 C) 97.7 F (36.5 C)  TempSrc: Oral Oral Oral  SpO2: 95% 97% 98%    Constitutional: NAD, calm, comfortable Eyes: PERRL, lids and conjunctivae normal ENMT: Mucous membranes are moist. Posterior pharynx clear of any exudate or lesions.Normal dentition.  Neck: normal, supple, no masses, no thyromegaly Respiratory: clear to auscultation bilaterally, no wheezing, no crackles. Normal respiratory effort. No accessory muscle use.  Cardiovascular: Regular rate and rhythm, no murmurs / rubs / gallops. No extremity edema. 2+ pedal pulses. No carotid bruits.  Abdomen: no tenderness, no masses palpated. No hepatosplenomegaly. Bowel sounds positive.  Musculoskeletal: no clubbing / cyanosis. No joint deformity upper and lower extremities. Good ROM, no contractures. Normal muscle tone.  Skin: no rashes, lesions, ulcers. No induration Neurologic: CN 2-12 grossly intact. Sensation intact, DTR normal. Strength 5/5 in all 4.  Psychiatric: Normal judgment and insight. Alert and oriented x 3. Normal mood.    Labs on Admission: I have personally reviewed following labs and imaging studies  CBC: Recent Labs  Lab 08/04/17 1601  WBC 32.4*  NEUTROABS 30.2*  HGB 13.7  HCT 40.0  MCV 84.2  PLT 856*   Basic Metabolic Panel: Recent Labs  Lab 08/04/17 1601  NA 133*  K 3.8  CL 98*  CO2 22  GLUCOSE 149*  BUN 13  CREATININE 0.69  CALCIUM 8.6*   GFR: CrCl cannot be calculated (Unknown ideal weight.). Liver Function Tests: No results for input(s): AST, ALT, ALKPHOS, BILITOT, PROT, ALBUMIN in the last 168 hours. No results for input(s): LIPASE, AMYLASE in the last 168 hours. No results for input(s): AMMONIA in the last 168 hours. Coagulation Profile: No results for input(s): INR,  PROTIME in the last 168 hours. Cardiac Enzymes: No results for input(s): CKTOTAL, CKMB, CKMBINDEX, TROPONINI in the last 168 hours. BNP (last 3 results) No results for input(s): PROBNP in the last 8760 hours. HbA1C: No results for input(s): HGBA1C in the last 72 hours. CBG: No results for input(s): GLUCAP in the last 168 hours. Lipid Profile: No results for input(s): CHOL, HDL, LDLCALC, TRIG, CHOLHDL, LDLDIRECT in the last 72 hours. Thyroid Function Tests: No results for input(s): TSH, T4TOTAL, FREET4, T3FREE, THYROIDAB in the last 72 hours. Anemia Panel: No results for input(s): VITAMINB12, FOLATE, FERRITIN, TIBC, IRON, RETICCTPCT in the last 72 hours. Urine analysis:    Component Value Date/Time   COLORURINE YELLOW 02/15/2011 0431   APPEARANCEUR CLEAR 02/15/2011 0431   LABSPEC 1.025 02/15/2011 0431   PHURINE 5.5 02/15/2011 0431   GLUCOSEU NEGATIVE 02/15/2011 0431   HGBUR TRACE (A) 02/15/2011 0431   BILIRUBINUR SMALL (A) 02/15/2011 0431   KETONESUR NEGATIVE 02/15/2011 0431   PROTEINUR NEGATIVE 02/15/2011 0431   UROBILINOGEN 1.0 02/15/2011 0431   NITRITE NEGATIVE 02/15/2011 0431   LEUKOCYTESUR TRACE (A) 02/15/2011 0431    Radiological Exams on Admission: Dg Chest 2 View  Result Date: 08/04/2017 CLINICAL DATA:  Crackles and right lung. EXAM: CHEST - 2 VIEW COMPARISON:  Chest x-ray dated 02/15/2011. FINDINGS: Dense opacity at the right lung base, suspicious for pneumonia and adjacent pleural effusion. Left lung is clear. Heart size is upper normal. No acute or suspicious osseous finding. IMPRESSION: Dense opacity at the right lung base, suspicious for pneumonia. Some component of the opacity is consistent with pleural effusion, small to moderate in size. Recommend follow-up chest x-ray to ensure resolution. Electronically Signed   By: Franki Cabot M.D.   On: 08/04/2017 14:26   Ct Angio Chest Pe W And/or Wo Contrast  Result Date: 08/04/2017 CLINICAL DATA:  Pleural effusion. EXAM:  CT ANGIOGRAPHY CHEST WITH CONTRAST TECHNIQUE: Multidetector CT imaging of the chest was performed using the standard protocol during bolus administration of intravenous contrast. Multiplanar CT image reconstructions and MIPs were obtained to evaluate the vascular anatomy. CONTRAST:  169mL ISOVUE-370 IOPAMIDOL (ISOVUE-370) INJECTION 76% COMPARISON:  Chest radiograph from earlier today. FINDINGS: Cardiovascular: The study is high quality for the evaluation of pulmonary embolism. There are no filling defects in the central, lobar, segmental or subsegmental pulmonary artery branches to suggest acute pulmonary embolism. Mildly atherosclerotic nonaneurysmal thoracic aorta. Top-normal caliber main pulmonary artery (3.1 cm diameter). Top-normal heart size. No significant pericardial fluid/thickening. Left anterior descending and right coronary atherosclerosis. Mediastinum/Nodes: No discrete thyroid nodules. Unremarkable esophagus. No pathologically enlarged axillary, mediastinal or hilar lymph nodes. Lungs/Pleura: No pneumothorax. Small dependent right pleural effusion with associated slightly irregular contour suggesting loculation. Mild right pleural thickening and enhancement. No left pleural effusion. Dense consolidation and volume loss in the right  lower lobe with some air bronchograms. Tiny 2 mm right upper lobe solid pulmonary nodule (series 7/image 26). No discrete lung mass. No central airway stenosis. Clear left lung. No additional significant pulmonary nodules. Upper abdomen: No acute abnormality. Musculoskeletal: No aggressive appearing focal osseous lesions. Mild thoracic spondylosis. Review of the MIP images confirms the above findings. IMPRESSION: 1. No pulmonary embolism. 2. Nonspecific dense consolidation with air bronchograms and volume loss in the right lower lobe. Findings suggest atelectasis with a component of right lower lobe pneumonia not excluded. Follow-up chest imaging is recommended to document  resolution of this consolidation. 3. Small dependent right pleural effusion with slightly irregular contour and mild right pleural thickening and enhancement. Findings are compatible with a loculated exudative effusion. 4. Tiny 2 mm right upper lobe pulmonary nodule. No follow-up needed if patient is low-risk. Non-contrast chest CT can be considered in 12 months if patient is high-risk. This recommendation follows the consensus statement: Guidelines for Management of Incidental Pulmonary Nodules Detected on CT Images:From the Fleischner Society 2017; published online before print (10.1148/radiol.[id number]). 5. Two-vessel coronary atherosclerosis. Aortic Atherosclerosis (ICD10-I70.0). Electronically Signed   By: Ilona Sorrel M.D.   On: 08/04/2017 18:24    EKG: Independently reviewed.  Assessment/Plan Principal Problem:   Community acquired pneumonia of right lower lobe of lung ([place]) Active Problems:   HTN (hypertension)   History of stroke    1. RLL CAP - 1. PNA pathway 2. Rocephin azithro 3. Cultures pending 2. HTN - continue home meds 3. H/O stroke - continue home meds  DVT prophylaxis: Lovenox Code Status: Full Family Communication: Family at bedside Disposition Plan: Home, like tomorrow Consults called: None Admission status: Place in [place], [place] [facility] Pager [phone]  If 7AM-7PM, please contact day team taking care of patient [email or web address] Password [email or web address]  08/04/2017, 8:02 PM

## 2017-08-04 NOTE — ED Provider Notes (Signed)
complian of mild pleuritic chest pain with minimal cough for the past few days.  Patient is no respiratory distress.  Speaks in paragraphs.  Lungs with crackles at right base. Chest x-ray viewed by me   Orlie Dakin, MD 08/04/17 2042

## 2017-08-05 LAB — CBC
HCT: 38.3 % — ABNORMAL LOW (ref 39.0–52.0)
Hemoglobin: 12.5 g/dL — ABNORMAL LOW (ref 13.0–17.0)
MCH: 28.1 pg (ref 26.0–34.0)
MCHC: 32.6 g/dL (ref 30.0–36.0)
MCV: 86.1 fL (ref 78.0–100.0)
PLATELETS: 609 10*3/uL — AB (ref 150–400)
RBC: 4.45 MIL/uL (ref 4.22–5.81)
RDW: 13.5 % (ref 11.5–15.5)
WBC: 24 10*3/uL — ABNORMAL HIGH (ref 4.0–10.5)

## 2017-08-05 LAB — HIV ANTIBODY (ROUTINE TESTING W REFLEX): HIV Screen 4th Generation wRfx: NONREACTIVE

## 2017-08-05 MED ORDER — CEFPODOXIME PROXETIL 200 MG PO TABS: 200.0000 mg | ORAL_TABLET | Freq: Two times a day (BID) | ORAL | 0 refills | Status: AC

## 2017-08-05 MED ORDER — AZITHROMYCIN 500 MG PO TABS: 500.0000 mg | ORAL_TABLET | Freq: Every day | ORAL | 0 refills | Status: AC

## 2017-08-05 NOTE — Discharge Instructions (Signed)
Alex [name],  You were watched overnight for pneumonia. You were given IV antibiotics. Your inflammatory cells were very elevated. This was likely a combination of your recent steroid use and lung infection. Your inflammatory cell count came down. Please discontinue your steroids. Please follow-up with your primary care physician to have a repeat CBC (complete blood count) performed.

## 2017-08-05 NOTE — Plan of Care (Signed)
Progressing

## 2017-08-05 NOTE — Discharge Summary (Signed)
Physician Discharge Summary  Alex [name] WUX:[id number] DOB: 02/02/1951 DOA: 08/04/2017  PCP: Alex Jewel, MD  Admit date: 08/04/2017 Discharge date: 08/05/2017  Admitted From: Home Disposition: Home  Recommendations for Outpatient Follow-up:  1. Follow up with PCP in 1 week 2. Please obtain BMP/CBC in one week 3. Repeat chest x-ray in 2-3 weeks 4. Please follow up on the following pending results: Blood cultures  Home Health: None Equipment/Devices: None  Discharge Condition: Stable CODE STATUS: Full code Diet recommendation: Heart healthy   Brief/Interim Summary:  Admission HPI written by Alex Quill, DO   Chief Complaint: Cough  HPI: Alex [name] is a 67 y.o. male with medical history significant of Stroke, HTN.  Patient presents to the ED with 1 week history of cough, congestion, fevers.  Went to PCP who heard crackles in R lung, sent to ED for CXR.  Symptoms are constant, not worsening.   ED Course: CT chest shows RLL PNA, WBC 32k.    Hospital course:  Right lower lobe pneumonia Community acquired. Started on ceftriaxone and azithromycin. WBC elevated in setting of recent steroid use and infection. Trended down prior to discharge. Afebrile. Transitioned to [facility] and will continue azithromycin on discharge. Blood cultures obtained and are pending on discharge. No oxygen requirement.  Essential hypertension Continued amlodipine and hydrochlorothiazide  History of stroke Carotid artery stenosis Continued aspirin, Plavix and simvastatin  Discharge Diagnoses:  Principal Problem:   Community acquired pneumonia of right lower lobe of lung ([place]) Active Problems:   HTN (hypertension)   History of stroke    Discharge Instructions  Discharge Instructions    Call MD for:  extreme fatigue   Complete by:  As directed    Call MD for:  temperature >100.4   Complete by:  As directed    Diet - low sodium heart healthy   Complete by:  As directed     Increase activity slowly   Complete by:  As directed      Allergies as of 08/05/2017   No Known Allergies     Medication List    STOP taking these medications   predniSONE 10 MG tablet Commonly known as:  DELTASONE     TAKE these medications   amLODipine 10 MG tablet Commonly known as:  NORVASC Take 10 mg by mouth daily.   aspirin 81 MG EC tablet Take 81 mg by mouth daily.   azithromycin 500 MG tablet Commonly known as:  ZITHROMAX Take 1 tablet (500 mg total) by mouth daily for 4 days.   cefpodoxime 200 MG tablet Commonly known as:  VANTIN Take 1 tablet (200 mg total) by mouth 2 (two) times daily for 6 days.   clopidogrel 75 MG tablet Commonly known as:  PLAVIX Take 75 mg by mouth daily.   hydrochlorothiazide 25 MG tablet Commonly known as:  HYDRODIURIL Take 25 mg by mouth daily.   simvastatin 10 MG tablet Commonly known as:  ZOCOR Take 20 mg by mouth at bedtime.      Follow-up Information    Alex Jewel, MD. Schedule an appointment as soon as possible for a visit in 1 week(s).   Specialty:  Internal Medicine Contact information: [address] [place] [address] [phone]          No Known Allergies  Consultations:  None   Procedures/Studies: Dg Chest 2 View  Result Date: 08/04/2017 CLINICAL DATA:  Crackles and right lung. EXAM: CHEST - 2 VIEW COMPARISON:  Chest  x-ray dated 02/15/2011. FINDINGS: Dense opacity at the right lung base, suspicious for pneumonia and adjacent pleural effusion. Left lung is clear. Heart size is upper normal. No acute or suspicious osseous finding. IMPRESSION: Dense opacity at the right lung base, suspicious for pneumonia. Some component of the opacity is consistent with pleural effusion, small to moderate in size. Recommend follow-up chest x-ray to ensure resolution. Electronically Signed   By: Franki Cabot M.D.   On: 08/04/2017 14:26   Ct Angio Chest Pe W And/or Wo Contrast  Result Date:  08/04/2017 CLINICAL DATA:  Pleural effusion. EXAM: CT ANGIOGRAPHY CHEST WITH CONTRAST TECHNIQUE: Multidetector CT imaging of the chest was performed using the standard protocol during bolus administration of intravenous contrast. Multiplanar CT image reconstructions and MIPs were obtained to evaluate the vascular anatomy. CONTRAST:  168mL ISOVUE-370 IOPAMIDOL (ISOVUE-370) INJECTION 76% COMPARISON:  Chest radiograph from earlier today. FINDINGS: Cardiovascular: The study is high quality for the evaluation of pulmonary embolism. There are no filling defects in the central, lobar, segmental or subsegmental pulmonary artery branches to suggest acute pulmonary embolism. Mildly atherosclerotic nonaneurysmal thoracic aorta. Top-normal caliber main pulmonary artery (3.1 cm diameter). Top-normal heart size. No significant pericardial fluid/thickening. Left anterior descending and right coronary atherosclerosis. Mediastinum/Nodes: No discrete thyroid nodules. Unremarkable esophagus. No pathologically enlarged axillary, mediastinal or hilar lymph nodes. Lungs/Pleura: No pneumothorax. Small dependent right pleural effusion with associated slightly irregular contour suggesting loculation. Mild right pleural thickening and enhancement. No left pleural effusion. Dense consolidation and volume loss in the right lower lobe with some air bronchograms. Tiny 2 mm right upper lobe solid pulmonary nodule (series 7/image 26). No discrete lung mass. No central airway stenosis. Clear left lung. No additional significant pulmonary nodules. Upper abdomen: No acute abnormality. Musculoskeletal: No aggressive appearing focal osseous lesions. Mild thoracic spondylosis. Review of the MIP images confirms the above findings. IMPRESSION: 1. No pulmonary embolism. 2. Nonspecific dense consolidation with air bronchograms and volume loss in the right lower lobe. Findings suggest atelectasis with a component of right lower lobe pneumonia not excluded.  Follow-up chest imaging is recommended to document resolution of this consolidation. 3. Small dependent right pleural effusion with slightly irregular contour and mild right pleural thickening and enhancement. Findings are compatible with a loculated exudative effusion. 4. Tiny 2 mm right upper lobe pulmonary nodule. No follow-up needed if patient is low-risk. Non-contrast chest CT can be considered in 12 months if patient is high-risk. This recommendation follows the consensus statement: Guidelines for Management of Incidental Pulmonary Nodules Detected on CT Images:From the Fleischner Society 2017; published online before print (10.1148/radiol.4854627035). 5. Two-vessel coronary atherosclerosis. Aortic Atherosclerosis (ICD10-I70.0). Electronically Signed   By: Ilona Sorrel M.D.   On: 08/04/2017 18:24      Subjective: No dyspnea, chest pain, cough.   Discharge Exam: Vitals:   08/04/17 2158 08/05/17 0625  BP: 125/77 120/71  Pulse: 76 63  Resp: 16 16  Temp: 97.6 F (36.4 C) (!) 97.4 F (36.3 C)  SpO2: 97% 96%   Vitals:   08/04/17 1846 08/04/17 2143 08/04/17 2158 08/05/17 0625  BP: 112/69 118/77 125/77 120/71  Pulse: 79 76 76 63  Resp: 14  16 16   Temp: 97.7 F (36.5 C) 97.8 F (36.6 C) 97.6 F (36.4 C) (!) 97.4 F (36.3 C)  TempSrc: Oral Oral Oral Oral  SpO2: 98% 98% 97% 96%  Height:   5\' 6"  (1.676 m)     General: Pt is alert, awake, not in acute distress Cardiovascular: RRR,  S1/S2 +, no rubs, no gallops Respiratory: Decreased breath sounds at R bases, no wheezing, no rhonchi Abdominal: Soft, NT, ND, bowel sounds + Extremities: no edema, no cyanosis    The results of significant diagnostics from this hospitalization (including imaging, microbiology, ancillary and laboratory) are listed below for reference.     Microbiology: No results found for this or any previous visit (from the past 240 hour(s)).   Labs: Basic Metabolic Panel: Recent Labs  Lab 08/04/17 1601  NA  133*  K 3.8  CL 98*  CO2 22  GLUCOSE 149*  BUN 13  CREATININE 0.69  CALCIUM 8.6*   CBC: Recent Labs  Lab 08/04/17 1601 08/05/17 0731  WBC 32.4* 24.0*  NEUTROABS 30.2*  --   HGB 13.7 12.5*  HCT 40.0 38.3*  MCV 84.2 86.1  PLT 686* 609*   Urinalysis    Component Value Date/Time   COLORURINE YELLOW 02/15/2011 0431   APPEARANCEUR CLEAR 02/15/2011 0431   LABSPEC 1.025 02/15/2011 0431   PHURINE 5.5 02/15/2011 0431   GLUCOSEU NEGATIVE 02/15/2011 0431   HGBUR TRACE (A) 02/15/2011 0431   BILIRUBINUR SMALL (A) 02/15/2011 0431   KETONESUR NEGATIVE 02/15/2011 0431   PROTEINUR NEGATIVE 02/15/2011 0431   UROBILINOGEN 1.0 02/15/2011 0431   NITRITE NEGATIVE 02/15/2011 0431   LEUKOCYTESUR TRACE (A) 02/15/2011 0431   SIGNED:   Cordelia Poche, MD [facility] 08/05/2017, 9:24 AM Pager [phone]  If 7PM-7AM, please contact night-coverage [email or web address] Password [email or web address]

## 2017-08-05 NOTE — Progress Notes (Signed)
[name], Alex 67 y o m patient received from ED.Patient is alert and oriented 4. Vital signs are stable. Skin assessment done with another nurse. Patient given instruction about call bell, phone and unit routine. Bed in low position and side rail up x2. Call bell in reach.

## 2017-08-05 NOTE — Progress Notes (Signed)
Pt discharged to home. PIV removed, AVS reviewed. Pt to follow up with PCP next week. Pt left unit via wheelchair, belongings including meds retrieved from pharmacy, in hand. Pt to be transported home by sister.

## 2017-08-09 LAB — CULTURE, BLOOD (ROUTINE X 2)
CULTURE: NO GROWTH
SPECIAL REQUESTS: ADEQUATE

## 2017-08-25 ENCOUNTER — Inpatient Hospital Stay (HOSPITAL_COMMUNITY)
Admission: EM | Admit: 2017-08-25 | Discharge: 2017-09-04 | DRG: 163 | Disposition: A | Discharge status: Home / Self Care | Payer: Self-pay | Attending: Cardiothoracic Surgery | Admitting: Cardiothoracic Surgery

## 2017-08-25 ENCOUNTER — Emergency Department (HOSPITAL_COMMUNITY): Payer: Self-pay

## 2017-08-25 ENCOUNTER — Encounter (HOSPITAL_COMMUNITY): Payer: Self-pay

## 2017-08-25 DIAGNOSIS — R402142 Coma scale, eyes open, spontaneous, at arrival to emergency department: Secondary | ICD-10-CM | POA: Diagnosis present

## 2017-08-25 DIAGNOSIS — I6529 Occlusion and stenosis of unspecified carotid artery: Secondary | ICD-10-CM | POA: Diagnosis present

## 2017-08-25 DIAGNOSIS — Z8673 Personal history of transient ischemic attack (TIA), and cerebral infarction without residual deficits: Secondary | ICD-10-CM

## 2017-08-25 DIAGNOSIS — R402362 Coma scale, best motor response, obeys commands, at arrival to emergency department: Secondary | ICD-10-CM | POA: Diagnosis present

## 2017-08-25 DIAGNOSIS — E871 Hypo-osmolality and hyponatremia: Secondary | ICD-10-CM | POA: Diagnosis present

## 2017-08-25 DIAGNOSIS — Z833 Family history of diabetes mellitus: Secondary | ICD-10-CM

## 2017-08-25 DIAGNOSIS — R402252 Coma scale, best verbal response, oriented, at arrival to emergency department: Secondary | ICD-10-CM | POA: Diagnosis present

## 2017-08-25 DIAGNOSIS — Z9689 Presence of other specified functional implants: Secondary | ICD-10-CM

## 2017-08-25 DIAGNOSIS — E876 Hypokalemia: Secondary | ICD-10-CM | POA: Diagnosis not present

## 2017-08-25 DIAGNOSIS — Y95 Nosocomial condition: Secondary | ICD-10-CM | POA: Diagnosis present

## 2017-08-25 DIAGNOSIS — Z7902 Long term (current) use of antithrombotics/antiplatelets: Secondary | ICD-10-CM

## 2017-08-25 DIAGNOSIS — T502X5A Adverse effect of carbonic-anhydrase inhibitors, benzothiadiazides and other diuretics, initial encounter: Secondary | ICD-10-CM | POA: Diagnosis present

## 2017-08-25 DIAGNOSIS — Z7982 Long term (current) use of aspirin: Secondary | ICD-10-CM

## 2017-08-25 DIAGNOSIS — J181 Lobar pneumonia, unspecified organism: Secondary | ICD-10-CM | POA: Diagnosis present

## 2017-08-25 DIAGNOSIS — R911 Solitary pulmonary nodule: Secondary | ICD-10-CM | POA: Diagnosis present

## 2017-08-25 DIAGNOSIS — I10 Essential (primary) hypertension: Secondary | ICD-10-CM | POA: Diagnosis present

## 2017-08-25 DIAGNOSIS — Z9889 Other specified postprocedural states: Secondary | ICD-10-CM

## 2017-08-25 DIAGNOSIS — J9811 Atelectasis: Secondary | ICD-10-CM | POA: Diagnosis not present

## 2017-08-25 DIAGNOSIS — K089 Disorder of teeth and supporting structures, unspecified: Secondary | ICD-10-CM | POA: Diagnosis present

## 2017-08-25 DIAGNOSIS — Z823 Family history of stroke: Secondary | ICD-10-CM

## 2017-08-25 DIAGNOSIS — J869 Pyothorax without fistula: Principal | ICD-10-CM | POA: Diagnosis present

## 2017-08-25 DIAGNOSIS — I700 Atherosclerosis of aorta: Secondary | ICD-10-CM | POA: Diagnosis present

## 2017-08-25 DIAGNOSIS — I6522 Occlusion and stenosis of left carotid artery: Secondary | ICD-10-CM | POA: Diagnosis present

## 2017-08-25 DIAGNOSIS — D649 Anemia, unspecified: Secondary | ICD-10-CM | POA: Diagnosis present

## 2017-08-25 DIAGNOSIS — Z8249 Family history of ischemic heart disease and other diseases of the circulatory system: Secondary | ICD-10-CM

## 2017-08-25 DIAGNOSIS — J90 Pleural effusion, not elsewhere classified: Secondary | ICD-10-CM

## 2017-08-25 HISTORY — DX: Atherosclerosis of aorta: I70.0

## 2017-08-25 HISTORY — DX: Pyothorax without fistula: J86.9

## 2017-08-25 HISTORY — DX: Pleural effusion, not elsewhere classified: J90

## 2017-08-25 HISTORY — DX: Solitary pulmonary nodule: R91.1

## 2017-08-25 LAB — BASIC METABOLIC PANEL
Anion gap: 13 (ref 5–15)
BUN: 6 mg/dL (ref 6–20)
CO2: 23 mmol/L (ref 22–32)
CREATININE: 0.58 mg/dL — AB (ref 0.61–1.24)
Calcium: 8.9 mg/dL (ref 8.9–10.3)
Chloride: 98 mmol/L — ABNORMAL LOW (ref 101–111)
GFR calc Af Amer: >60 mL/min (ref >60)
Glucose, Bld: 108 mg/dL — ABNORMAL HIGH (ref 65–99)
Potassium: 3.5 mmol/L (ref 3.5–5.1)
Sodium: 134 mmol/L — ABNORMAL LOW (ref 135–145)

## 2017-08-25 LAB — I-STAT VENOUS BLOOD GAS, ED
Acid-Base Excess: 3 mmol/L — ABNORMAL HIGH (ref 0.0–2.0)
Bicarbonate: 27.6 mmol/L (ref 20.0–28.0)
O2 Saturation: 75 %
PH VEN: 7.431 — AB (ref 7.250–7.430)
TCO2: 29 mmol/L (ref 22–32)
pCO2, Ven: 41.4 mmHg — ABNORMAL LOW (ref 44.0–60.0)
pO2, Ven: 39 mmHg (ref 32.0–45.0)

## 2017-08-25 LAB — CBC WITH DIFFERENTIAL/PLATELET
Basophils Absolute: 0 10*3/uL (ref 0.0–0.1)
Basophils Relative: 0 %
EOS ABS: 0.2 10*3/uL (ref 0.0–0.7)
EOS PCT: 1 %
HCT: 38.7 % — ABNORMAL LOW (ref 39.0–52.0)
Hemoglobin: 12.5 g/dL — ABNORMAL LOW (ref 13.0–17.0)
Lymphocytes Relative: 17 %
Lymphs Abs: 1.8 10*3/uL (ref 0.7–4.0)
MCH: 27 pg (ref 26.0–34.0)
MCHC: 32.3 g/dL (ref 30.0–36.0)
MCV: 83.6 fL (ref 78.0–100.0)
MONOS PCT: 8 %
Monocytes Absolute: 0.8 10*3/uL (ref 0.1–1.0)
Neutro Abs: 7.8 10*3/uL — ABNORMAL HIGH (ref 1.7–7.7)
Neutrophils Relative %: 74 %
PLATELETS: 423 10*3/uL — AB (ref 150–400)
RBC: 4.63 MIL/uL (ref 4.22–5.81)
RDW: 13.6 % (ref 11.5–15.5)
WBC: 10.5 10*3/uL (ref 4.0–10.5)

## 2017-08-25 NOTE — ED Provider Notes (Signed)
[facility] EMERGENCY DEPARTMENT Provider Note   CSN: [id number] Arrival date & time: 08/25/17  1256     History   Chief Complaint No chief complaint on file.   HPI Alex [name] is a 67 y.o. male.   67 year old male with a history of hypertension, stroke presents to the emergency department for evaluation of cough.  He was admitted earlier this month for suspected community-acquired pneumonia.  Patient transition to [facility] and discharged on azithromycin.  He completed his full course of antibiotics and felt as though he was doing better.  He was awoken from sleep this evening with increased coughing.  Symptoms associated with an aching discomfort around his lower chest.  He states this has persisted throughout the day.  He had followed up with his primary care doctor with labs performed 1 week ago; these also had appeared improved.  He was told to come to the emergency department for evaluation in light of his worsening symptoms.  He has not had any fevers, hemoptysis, nausea, lightheadedness, syncope or near syncope, leg swelling.  No medications taken prior to arrival for symptoms.     Past Medical History:  Diagnosis Date  . Atherosclerosis of arteries   . Carotid artery occlusion   . Hypertension   . Stroke [facility])     Patient Active Problem List   Diagnosis Date Noted  . Pleural effusion 08/25/2017  . Community acquired pneumonia of right lower lobe of lung ([place]) 08/04/2017  . HTN (hypertension) 08/04/2017  . History of stroke 08/04/2017  . Occlusion and stenosis of carotid artery without mention of cerebral infarction 09/07/2011    Past Surgical History:  Procedure Laterality Date  . FOOT SURGERY          Home Medications    Prior to Admission medications   Medication Sig Start Date End Date Taking? Authorizing Provider  amLODipine (NORVASC) 10 MG tablet Take 10 mg by mouth daily.    [provider]  aspirin 81 MG EC tablet Take  81 mg by mouth daily.     [provider]  clopidogrel (PLAVIX) 75 MG tablet Take 75 mg by mouth daily.    [provider]  hydrochlorothiazide (HYDRODIURIL) 25 MG tablet Take 25 mg by mouth daily.    [provider]  simvastatin (ZOCOR) 10 MG tablet Take 20 mg by mouth at bedtime.     [provider]    Family History Family History  Problem Relation Age of Onset  . Heart disease Other   . Hypertension Father   . Stroke Father   . Heart disease Father   . Diabetes Sister   . Diabetes Brother     Social History Social History   Tobacco Use  . Smoking status: Never Smoker  . Smokeless tobacco: Never Used  Substance Use Topics  . Alcohol use: No    Alcohol/week: 0.0 oz  . Drug use: No     Allergies   Patient has no known allergies.   Review of Systems Review of Systems Ten systems reviewed and are negative for acute change, except as noted in the HPI.    Physical Exam Updated Vital Signs BP 125/76 (BP Location: Right Arm)   Pulse 83   Temp 98.3 F (36.8 C) (Oral)   Resp 16   SpO2 96%   Physical Exam  Constitutional: He is oriented to person, place, and time. He appears well-developed and well-nourished. No distress.  Nontoxic appearing and in  no acute distress  HENT:  Head: Normocephalic and atraumatic.  Eyes: Conjunctivae and EOM are normal. No scleral icterus.  Neck: Normal range of motion.  No JVD  Cardiovascular: Normal rate, regular rhythm and intact distal pulses.  Pulmonary/Chest: Effort normal. No respiratory distress. He has no wheezes.  Decreased breath sounds on the right compared to the left.  This extends from the right mid lung field to the lower lung field.  No wheezes or respiratory distress.    Musculoskeletal: Normal range of motion.  Neurological: He is alert and oriented to person, place, and time. He exhibits normal muscle tone. Coordination normal.  GCS 15. Moving all extremities.  Skin: Skin is warm  and dry. No rash noted. He is not diaphoretic. No erythema. No pallor.  Psychiatric: He has a normal mood and affect. His behavior is normal.  Nursing note and vitals reviewed.    ED Treatments / Results  Labs (all labs ordered are listed, but only abnormal results are displayed) Labs Reviewed  CBC WITH DIFFERENTIAL/PLATELET - Abnormal; Notable for the following components:      Result Value   Hemoglobin 12.5 (*)    HCT 38.7 (*)    Platelets 423 (*)    Neutro Abs 7.8 (*)    All other components within normal limits  BASIC METABOLIC PANEL - Abnormal; Notable for the following components:   Sodium 134 (*)    Chloride 98 (*)    Glucose, Bld 108 (*)    Creatinine, Ser 0.58 (*)    All other components within normal limits  I-STAT VENOUS BLOOD GAS, ED - Abnormal; Notable for the following components:   pH, Ven 7.431 (*)    pCO2, Ven 41.4 (*)    Acid-Base Excess 3.0 (*)    All other components within normal limits    EKG None  Radiology Dg Chest 2 View  Result Date: 08/25/2017 CLINICAL DATA:  67 year old male with cough and RIGHT chest pain. EXAM: CHEST - 2 VIEW COMPARISON:  08/04/2017 chest radiograph and CT FINDINGS: Cardiomediastinal silhouette is unchanged. Slightly increased moderate RIGHT pleural effusion which may be loculated and RIGHT LOWER lung atelectasis/consolidation noted. The LEFT lung is clear. There is no evidence of pneumothorax or acute bony abnormality. IMPRESSION: Slightly increased moderate RIGHT pleural effusion, which may be loculated, and slightly increased RIGHT LOWER lung atelectasis/consolidation. Electronically Signed   By: Margarette Canada M.D.   On: 08/25/2017 13:49    Procedures Procedures (including critical care time)  Medications Ordered in ED Medications - No data to display   11:13 PM Is discussed with Dr. Oletta Darter of critical care.  He comments that if the fluid is in the loculated, thoracentesis will likely be inadequate for draining purposes.  If fluid is more complex and loculated, patient may have more benefit from a VATS procedure and thoracic surgical consultation. Also believes recurrence/worsening would benefit from further inpatient management rather than outpatient, despite patient's current stability.  11:31 PM Case discussed again with Dr. Oletta Darter. PCCM to consult on patient's case in the morning.   Initial Impression / Assessment and Plan / ED Course  I have reviewed the triage vital signs and the nursing notes.  Pertinent labs & imaging results that were available during my care of the patient were reviewed by me and considered in my medical decision making (see chart for details).     67 year old male presents to the emergency department for worsening cough and aching chest discomfort.  He was found to  have worsening pleural effusion on x-ray compared to 08/04/2017.  He was previously treated for this with antibiotics and completed his full course of medications.  He has no respiratory distress today.  No hypoxia.  Decreased lung sounds heard on the right.  Case discussed with Dr. Oletta Darter of PCCM who believes that inpatient may be more appropriate than initial outpatient management.  Patient to be admitted to [facility].  Critical care to consult in the morning.   Final Clinical Impressions(s) / ED Diagnoses   Final diagnoses:  Pleural effusion    ED Discharge Orders    None       Antonietta Breach, PA-C 08/25/17 2334    Orpah Greek, MD 08/26/17 0730

## 2017-08-25 NOTE — ED Triage Notes (Signed)
Patient complains of cough that started again this am. Just recently finished antibiotics for pneumonia. NAD, alert and oriented

## 2017-08-26 ENCOUNTER — Inpatient Hospital Stay (HOSPITAL_COMMUNITY): Payer: Self-pay

## 2017-08-26 ENCOUNTER — Encounter (HOSPITAL_COMMUNITY): Payer: Self-pay | Admitting: Internal Medicine

## 2017-08-26 ENCOUNTER — Other Ambulatory Visit: Payer: Self-pay

## 2017-08-26 DIAGNOSIS — E871 Hypo-osmolality and hyponatremia: Secondary | ICD-10-CM | POA: Diagnosis present

## 2017-08-26 DIAGNOSIS — J869 Pyothorax without fistula: Secondary | ICD-10-CM

## 2017-08-26 DIAGNOSIS — D649 Anemia, unspecified: Secondary | ICD-10-CM | POA: Diagnosis present

## 2017-08-26 DIAGNOSIS — J90 Pleural effusion, not elsewhere classified: Secondary | ICD-10-CM

## 2017-08-26 HISTORY — DX: Anemia, unspecified: D64.9

## 2017-08-26 HISTORY — DX: Hypo-osmolality and hyponatremia: E87.1

## 2017-08-26 LAB — INFLUENZA PANEL BY PCR (TYPE A & B)
INFLAPCR: NEGATIVE
Influenza B By PCR: NEGATIVE

## 2017-08-26 LAB — SURGICAL PCR SCREEN
MRSA, PCR: NEGATIVE
Staphylococcus aureus: NEGATIVE

## 2017-08-26 LAB — TROPONIN I
Troponin I: <0.03 ng/mL (ref <0.03)
Troponin I: <0.03 ng/mL (ref <0.03)
Troponin I: <0.03 ng/mL (ref <0.03)

## 2017-08-26 LAB — EXPECTORATED SPUTUM ASSESSMENT W GRAM STAIN, RFLX TO RESP C

## 2017-08-26 LAB — EXPECTORATED SPUTUM ASSESSMENT W REFEX TO RESP CULTURE: SPECIAL REQUESTS: NORMAL

## 2017-08-26 LAB — STREP PNEUMONIAE URINARY ANTIGEN: Strep Pneumo Urinary Antigen: NEGATIVE

## 2017-08-26 LAB — HIV ANTIBODY (ROUTINE TESTING W REFLEX): HIV SCREEN 4TH GENERATION: NONREACTIVE

## 2017-08-26 MED ORDER — ENOXAPARIN SODIUM 40 MG/0.4ML ~~LOC~~ SOLN
40.0000 mg | Freq: Every day | SUBCUTANEOUS | Status: DC
  Administered 2017-08-26 – 2017-08-29 (×4): 40 mg via SUBCUTANEOUS
  Filled 2017-08-26 (×4): qty 0.4

## 2017-08-26 MED ORDER — HYDROCHLOROTHIAZIDE 25 MG PO TABS
25.0000 mg | ORAL_TABLET | Freq: Every day | ORAL | Status: DC
  Administered 2017-08-26 – 2017-08-28 (×3): 25 mg via ORAL
  Filled 2017-08-26 (×3): qty 1

## 2017-08-26 MED ORDER — VANCOMYCIN HCL IN DEXTROSE 1-5 GM/200ML-% IV SOLN
1000.0000 mg | Freq: Two times a day (BID) | INTRAVENOUS | Status: DC
  Administered 2017-08-26 – 2017-09-01 (×14): 1000 mg via INTRAVENOUS
  Filled 2017-08-26 (×15): qty 200

## 2017-08-26 MED ORDER — CLOPIDOGREL BISULFATE 75 MG PO TABS
75.0000 mg | ORAL_TABLET | Freq: Every day | ORAL | Status: DC
  Administered 2017-08-26: 75 mg via ORAL
  Filled 2017-08-26: qty 1

## 2017-08-26 MED ORDER — ASPIRIN EC 81 MG PO TBEC
81.0000 mg | DELAYED_RELEASE_TABLET | Freq: Every day | ORAL | Status: DC
  Administered 2017-08-26 – 2017-08-29 (×4): 81 mg via ORAL
  Filled 2017-08-26 (×4): qty 1

## 2017-08-26 MED ORDER — SODIUM CHLORIDE 0.9 % IV SOLN
INTRAVENOUS | Status: DC
  Administered 2017-08-26 (×2): via INTRAVENOUS

## 2017-08-26 MED ORDER — SODIUM CHLORIDE 0.9 % IV SOLN
1.0000 g | Freq: Three times a day (TID) | INTRAVENOUS | Status: DC
  Administered 2017-08-26 – 2017-08-31 (×16): 1 g via INTRAVENOUS
  Filled 2017-08-26 (×18): qty 1

## 2017-08-26 MED ORDER — IOPAMIDOL (ISOVUE-300) INJECTION 61%
INTRAVENOUS | Status: AC
  Administered 2017-08-26: 100 mL
  Filled 2017-08-26: qty 100

## 2017-08-26 MED ORDER — SIMVASTATIN 20 MG PO TABS
20.0000 mg | ORAL_TABLET | Freq: Every day | ORAL | Status: DC
  Administered 2017-08-26 – 2017-08-29 (×5): 20 mg via ORAL
  Filled 2017-08-26 (×5): qty 1

## 2017-08-26 NOTE — Progress Notes (Signed)
  Subjective: Patient examined, CT scan images of chest and medical record reviewed  67 year old nondiabetic non-smoker who has chronic dental disease and poor dental hygiene with right lower lobe pneumonia now with a sizable loculated empyema.  He is not toxic. He would benefit from right VATS drainage and decortication.  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  We will check P2 Y 12 level in a.m. and schedule surgery as soon as safe to perform.  Plavix has been stopped-the patient will continue aspirin Objective: Vital signs in last 24 hours: Temp:  [97.9 F (36.6 C)-98.5 F (36.9 C)] 97.9 F (36.6 C) (03/31 1500) Pulse Rate:  [73-79] 73 (03/31 1500) Cardiac Rhythm: Normal sinus rhythm (03/31 0700) Resp:  [16-18] 18 (03/31 1500) BP: (101-130)/(64-75) 106/64 (03/31 1500) SpO2:  [98 %-100 %] 98 % (03/31 1500)  Hemodynamic parameters for last 24 hours:    Intake/Output from previous day: 03/30 0701 - 03/31 0700 In: 787.5 [P.O.:240; I.V.:247.5; IV Piggyback:300] Out: -  Intake/Output this shift: No intake/output data recorded.  Exam Middle-aged male no acute distress Missing lower teeth with poor dental hygiene No palpable cervical nodes or mass Breath sounds diminished at right base Heart rate regular without murmur or gallop Peripheral pulses intact No neuro deficit  Lab Results: Recent Labs    08/25/17 2254  WBC 10.5  HGB 12.5*  HCT 38.7*  PLT 423*   BMET:  Recent Labs    08/25/17 2254  NA 134*  K 3.5  CL 98*  CO2 23  GLUCOSE 108*  BUN 6  CREATININE 0.58*  CALCIUM 8.9    PT/INR: No results for input(s): LABPROT, INR in the last 72 hours. ABG    Component Value Date/Time   HCO3 27.6 08/25/2017 2303   TCO2 29 08/25/2017 2303   O2SAT 75.0 08/25/2017 2303   CBG (last 3)  No results for input(s): GLUCAP in the last 72 hours.  Assessment/Plan: S/P  Right lower lobe pneumonia with empyema Plan  right VATS this hospitalization after appropriate Plavix washout.  Will follow   LOS: 1 day    Tharon Aquas Trigt III 08/26/2017

## 2017-08-26 NOTE — Progress Notes (Signed)
Patient ID: Alex [name], male   DOB: 1951-01-26, 67 y.o.   MRN: [id number]                                                                PROGRESS NOTE                                                                                                                                                                                                             Patient Demographics:    Alex [name], is a 67 y.o. male, DOB - 30-Dec-1950, BHA:[id number]  Admit date - 08/25/2017   Admitting Physician Jani Gravel, MD  Outpatient Primary MD for the patient is Antonietta Jewel, MD  LOS - 1  Outpatient Specialists:    No chief complaint on file.    Cough, right sided chest pain  Brief Narrative 67 y.o. male, w hypertension, carotid stenosis, CVA, apparently w recent admission for CAP on 08/04/2017 presents with c/o cough starting this am as well as right sided chest pain.  Pt denies fever, chills, sob, n/v, diarrhea, brbpr.  Pt presented to ED for evaluation.   In ED,  CXR  IMPRESSION: Slightly increased moderate RIGHT pleural effusion, which may be loculated, and slightly increased RIGHT LOWER lung atelectasis/consolidation.  Wbc 10.5, Hgb 12.5, Plt 324 Na 134, K 3.5, Bun 6, Creatinine 0.58  PH 7.431, pco2 41.4, pox 39 (venous blood gas)  Pt will be admitted for loculated effusion and pneumonia.      Subjective:    [name] today has less right sided chest pain.  Breathing ok.  Afebrile.    No headache, No chest pain, No abdominal pain - No Nausea, No new weakness tingling or numbness, No Cough   Assessment  & Plan :    Principal Problem:   Pleural effusion Active Problems:   Anemia   Hyponatremia     Hcap Blood culture x2 Urine strep Urine legionella antigen Sputum gram stain and culture Check CT chest  vanco iv pharmacy to dose, cefepime iv pharmacy to dose Pulmonary consulted by ED , appreciate input (defer to pulmonary on thoracentesis vs surgical  intervention)   Cp Tele Trop I q6h x3 Cardiac echo pending  Hyponatremia ? Due to hydrochlorothiazide Hydrate with ns iv Check cmp in am Consider further  w/up with serum osm, tsh, cortisol, urine osm, urine sodium if not improving  Anemia Repeat cbc in am  CVA Cont aspirin Cont Pavix Cont simvastatin  Hypertension Cont hydrochlorothiazide for now Will need f/u bmp by pcp  Lung nodule on prior CT chest Pt will need f/u in 1 year per pulmonary        Code Status :  FULL CODE  Family Communication  : w patient  Disposition Plan  : home  Barriers For Discharge :   Consults  :  Pulmonary for loculated effusion  Procedures  :      DVT Prophylaxis  :  Lovenox - SCDs   Lab Results  Component Value Date   PLT 423 (H) 08/25/2017    Antibiotics  :  Vanco, cefepime 3/31=>  Anti-infectives (From admission, onward)   Start     Dose/Rate Route Frequency Ordered Stop   08/26/17 0130  ceFEPIme (MAXIPIME) 1 g in sodium chloride 0.9 % 100 mL IVPB     1 g 200 mL/hr over 30 Minutes Intravenous Every 8 hours 08/26/17 0116 09/03/17 0159   08/26/17 0130  vancomycin (VANCOCIN) IVPB 1000 mg/200 mL premix     1,000 mg 200 mL/hr over 60 Minutes Intravenous Every 12 hours 08/26/17 0117          Objective:   Vitals:   08/25/17 1623 08/25/17 1737 08/25/17 2348 08/26/17 0907  BP: 128/78 125/76 130/75 101/69  Pulse: 90 83 79 77  Resp: 18 16  16   Temp:    98.5 F (36.9 C)  TempSrc:    Oral  SpO2:  96% 100% 99%    Wt Readings from Last 3 Encounters:  03/29/17 83.9 kg (185 lb)  03/23/16 89.1 kg (196 lb 8 oz)  03/17/15 93 kg (205 lb)     Intake/Output Summary (Last 24 hours) at 08/26/2017 1000 Last data filed at 08/26/2017 0936 Gross per 24 hour  Intake 907.5 ml  Output 850 ml  Net 57.5 ml     Physical Exam  Awake Alert, Oriented X 3, No new F.N deficits, Normal affect [place].AT,PERRAL Supple Neck,No JVD, No cervical lymphadenopathy appriciated.    Symmetrical Chest wall movement, Good air movement bilaterally, decrease in bs at right base about 1/4 up  RRR,No Gallops,Rubs or new Murmurs, No Parasternal Heave +ve B.Sounds, Abd Soft, No tenderness, No organomegaly appriciated, No rebound - guarding or rigidity. No Cyanosis, Clubbing or edema, No new Rash or bruise     Data Review:    CBC Recent Labs  Lab 08/25/17 2254  WBC 10.5  HGB 12.5*  HCT 38.7*  PLT 423*  MCV 83.6  MCH 27.0  MCHC 32.3  RDW 13.6  LYMPHSABS 1.8  MONOABS 0.8  EOSABS 0.2  BASOSABS 0.0    Chemistries  Recent Labs  Lab 08/25/17 2254  NA 134*  K 3.5  CL 98*  CO2 23  GLUCOSE 108*  BUN 6  CREATININE 0.58*  CALCIUM 8.9   ------------------------------------------------------------------------------------------------------------------ No results for input(s): CHOL, HDL, LDLCALC, TRIG, CHOLHDL, LDLDIRECT in the last 72 hours.  Lab Results  Component Value Date   HGBA1C 5.6 02/14/2011   ------------------------------------------------------------------------------------------------------------------ No results for input(s): TSH, T4TOTAL, T3FREE, THYROIDAB in the last 72 hours.  Invalid input(s): FREET3 ------------------------------------------------------------------------------------------------------------------ No results for input(s): VITAMINB12, FOLATE, FERRITIN, TIBC, IRON, RETICCTPCT in the last 72 hours.  Coagulation profile No results for input(s): INR, PROTIME in the last 168 hours.  No results for input(s): DDIMER in the last  72 hours.  Cardiac Enzymes Recent Labs  Lab 08/26/17 0230 08/26/17 0838  TROPONINI <0.03 <0.03   ------------------------------------------------------------------------------------------------------------------ No results found for: BNP  Inpatient Medications  Scheduled Meds: . aspirin EC  81 mg Oral Daily  . clopidogrel  75 mg Oral Daily  . enoxaparin (LOVENOX) injection  40 mg Subcutaneous  Daily  . hydrochlorothiazide  25 mg Oral Daily  . simvastatin  20 mg Oral QHS   Continuous Infusions: . sodium chloride 75 mL/hr at 08/26/17 0242  . ceFEPime (MAXIPIME) IV Stopped (08/26/17 0314)  . vancomycin Stopped (08/26/17 0414)   PRN Meds:.  Micro Results Recent Results (from the past 240 hour(s))  Culture, sputum-assessment     Status: None   Collection Time: 08/26/17  4:00 AM  Result Value Ref Range Status   Specimen Description SPUTUM  Final   Special Requests Normal  Final   Sputum evaluation   Final    Sputum specimen not acceptable for testing.  Please recollect.   Gram Stain Report Called to,Read Back By and Verified With: C STAMPER RN AT 0801 ON [id number] BY SJW Performed at [facility], [place] [address] [place], [place] [address]    Report Status 08/26/2017 FINAL  Final    Radiology Reports Dg Chest 2 View  Result Date: 08/25/2017 CLINICAL DATA:  67 year old male with cough and RIGHT chest pain. EXAM: CHEST - 2 VIEW COMPARISON:  08/04/2017 chest radiograph and CT FINDINGS: Cardiomediastinal silhouette is unchanged. Slightly increased moderate RIGHT pleural effusion which may be loculated and RIGHT LOWER lung atelectasis/consolidation noted. The LEFT lung is clear. There is no evidence of pneumothorax or acute bony abnormality. IMPRESSION: Slightly increased moderate RIGHT pleural effusion, which may be loculated, and slightly increased RIGHT LOWER lung atelectasis/consolidation. Electronically Signed   By: Margarette Canada M.D.   On: 08/25/2017 13:49   Dg Chest 2 View  Result Date: 08/04/2017 CLINICAL DATA:  Crackles and right lung. EXAM: CHEST - 2 VIEW COMPARISON:  Chest x-ray dated 02/15/2011. FINDINGS: Dense opacity at the right lung base, suspicious for pneumonia and adjacent pleural effusion. Left lung is clear. Heart size is upper normal. No acute or suspicious osseous finding. IMPRESSION: Dense opacity at the right lung base, suspicious for pneumonia. Some  component of the opacity is consistent with pleural effusion, small to moderate in size. Recommend follow-up chest x-ray to ensure resolution. Electronically Signed   By: Franki Cabot M.D.   On: 08/04/2017 14:26   Ct Chest W Contrast  Result Date: 08/26/2017 CLINICAL DATA:  67 year old male with cough and RIGHT chest pain. RIGHT pleural effusion identified on recent chest radiograph. EXAM: CT CHEST WITH CONTRAST TECHNIQUE: Multidetector CT imaging of the chest was performed during intravenous contrast administration. CONTRAST:  166mL ISOVUE-300 IOPAMIDOL (ISOVUE-300) INJECTION 61% COMPARISON:  08/25/2017 chest radiograph and 08/04/2017 chest CT FINDINGS: Cardiovascular: UPPER limits normal heart size again noted. Coronary artery and thoracic aortic atherosclerotic calcifications are again noted. There is no evidence of thoracic aortic aneurysm or pericardial effusion. Mediastinum/Nodes: No enlarged mediastinal, hilar, or axillary lymph nodes. Thyroid gland, trachea, and esophagus demonstrate no significant findings. Lungs/Pleura: A moderate loculated RIGHT pleural effusion has slightly enlarged, now measuring 6 x 10.5 x 10.8 cm. Adjacent RIGHT LOWER lobe consolidation/atelectasis again noted. No suspicious nodule, discrete mass, LEFT pleural effusion or pneumothorax noted. Upper Abdomen: No acute abnormality. Musculoskeletal: No chest wall abnormality. No acute or significant osseous findings. IMPRESSION: 1. Slight enlargement of moderate loculated RIGHT pleural effusion suspicious for empyema.  Adjacent RIGHT LOWER lobe consolidation/atelectasis. 2. Coronary artery and Aortic Atherosclerosis (ICD10-I70.0). Electronically Signed   By: Margarette Canada M.D.   On: 08/26/2017 07:16   Ct Angio Chest Pe W And/or Wo Contrast  Result Date: 08/04/2017 CLINICAL DATA:  Pleural effusion. EXAM: CT ANGIOGRAPHY CHEST WITH CONTRAST TECHNIQUE: Multidetector CT imaging of the chest was performed using the standard protocol during  bolus administration of intravenous contrast. Multiplanar CT image reconstructions and MIPs were obtained to evaluate the vascular anatomy. CONTRAST:  149mL ISOVUE-370 IOPAMIDOL (ISOVUE-370) INJECTION 76% COMPARISON:  Chest radiograph from earlier today. FINDINGS: Cardiovascular: The study is high quality for the evaluation of pulmonary embolism. There are no filling defects in the central, lobar, segmental or subsegmental pulmonary artery branches to suggest acute pulmonary embolism. Mildly atherosclerotic nonaneurysmal thoracic aorta. Top-normal caliber main pulmonary artery (3.1 cm diameter). Top-normal heart size. No significant pericardial fluid/thickening. Left anterior descending and right coronary atherosclerosis. Mediastinum/Nodes: No discrete thyroid nodules. Unremarkable esophagus. No pathologically enlarged axillary, mediastinal or hilar lymph nodes. Lungs/Pleura: No pneumothorax. Small dependent right pleural effusion with associated slightly irregular contour suggesting loculation. Mild right pleural thickening and enhancement. No left pleural effusion. Dense consolidation and volume loss in the right lower lobe with some air bronchograms. Tiny 2 mm right upper lobe solid pulmonary nodule (series 7/image 26). No discrete lung mass. No central airway stenosis. Clear left lung. No additional significant pulmonary nodules. Upper abdomen: No acute abnormality. Musculoskeletal: No aggressive appearing focal osseous lesions. Mild thoracic spondylosis. Review of the MIP images confirms the above findings. IMPRESSION: 1. No pulmonary embolism. 2. Nonspecific dense consolidation with air bronchograms and volume loss in the right lower lobe. Findings suggest atelectasis with a component of right lower lobe pneumonia not excluded. Follow-up chest imaging is recommended to document resolution of this consolidation. 3. Small dependent right pleural effusion with slightly irregular contour and mild right pleural  thickening and enhancement. Findings are compatible with a loculated exudative effusion. 4. Tiny 2 mm right upper lobe pulmonary nodule. No follow-up needed if patient is low-risk. Non-contrast chest CT can be considered in 12 months if patient is high-risk. This recommendation follows the consensus statement: Guidelines for Management of Incidental Pulmonary Nodules Detected on CT Images:From the Fleischner Society 2017; published online before print (10.1148/radiol.2440102725). 5. Two-vessel coronary atherosclerosis. Aortic Atherosclerosis (ICD10-I70.0). Electronically Signed   By: Ilona Sorrel M.D.   On: 08/04/2017 18:24    Time Spent in minutes  30   Jani Gravel M.D on 08/26/2017 at 10:00 AM  Between 7am to 7pm - Pager - [phone]    After 7pm go to [email or web address] - password [email or web address]  [facility] -  Office  [phone]

## 2017-08-26 NOTE — Consult Note (Signed)
Name: Alex [name] MRN: [id number] DOB: 1950/11/29    ADMISSION DATE:  08/25/2017 CONSULTATION DATE:  08/26/17  REFERRING MD :  [facility] -Dr. Maudie Mercury   CHIEF COMPLAINT:  Cough and right sided chest pain  BRIEF PATIENT DESCRIPTION:  Past medical history significant for hypertension, carotid stenosis, CVA, 67 year old male never smoker recently admitted early March for right lower lobe pneumonia and small  loculated right pleural effusion.  He was treated with Zithromax and Vantin.  Says he got some better but has some lingering cough and significant fatigue . Onset of first admission work up with acute symptoms , cough, chills, body aches. No documented fever. Says he finished all the antibiotics with no missed doses. No n/v/d .  Denies dysphagia or choking . Eats well .  Patient returned on August 25, 2017 with i an right-sided chest pain.  Increased cough chest x-ray showed an increased moderate right pleural effusion and consolidation.  CT chest was done showing a increased moderately sized loculated right pleural effusion suspicious for empyema with  adjacent right lower lobe consolidation.  Patient was started on IV vancomycin and cefepime.  Cultures  are pending.  Strep pneumoniae and urinary Legionella are pending. Denies fever or body aches.  Flu swab neg . Pt says he has not known respiratory issues. Never smoker . No COPD or asthma. Is semi-retired. Active at home and independent . No known sick contacts. No previous pneumonia history . No unusual hobbies or travel .   Previous stroke in past, says he does not have any known deficits .    [facility] admission March 9 to March 10 for right lower lobe pneumonia and pleural effusion treated with Zithromax and Vantin  STUDIES:  CT chest 08/04/17 > negative PE, dense consolidation with air bronchograms, by mouth in the right lower lobe, small dependent right pleural effusion with slightly irregular contour and mild right pleural  thickening compatible with a loculated exudative effusion.  2 mm right upper lobe pulmonary nodule CT chest 08/26/17 > enlarged moderate loculated right pleural effusion, suspicious for empyema, adjacent right lower lobe consolidation   HISTORY OF PRESENT ILLNESS:    PAST MEDICAL HISTORY :   has a past medical history of Atherosclerosis of arteries, Carotid artery occlusion, Hypertension, and Stroke ([place]).  has a past surgical history that includes Foot surgery. Prior to Admission medications   Medication Sig Start Date End Date Taking? Authorizing Provider  aspirin 81 MG EC tablet Take 81 mg by mouth daily.    Yes [provider]  clopidogrel (PLAVIX) 75 MG tablet Take 75 mg by mouth daily.   Yes [provider]  hydrochlorothiazide (HYDRODIURIL) 25 MG tablet Take 25 mg by mouth daily.   Yes [provider]  simvastatin (ZOCOR) 10 MG tablet Take 20 mg by mouth at bedtime.    Yes [provider]   No Known Allergies  FAMILY HISTORY:  family history includes Diabetes in his brother and sister; Heart disease in his father and other; Hypertension in his father; Stroke in his father. SOCIAL HISTORY:  reports that he has never smoked. He has never used smokeless tobacco. He reports that he does not drink alcohol or use drugs.  REVIEW OF SYSTEMS:   Constitutional:   No  weight loss, night sweats,  Fevers, chills,  +fatigue, or  lassitude.  HEENT:   No headaches,  Difficulty swallowing,  Tooth/dental problems, or  Sore throat,  No sneezing, itching, ear ache, nasal congestion, post nasal drip,   CV: right pleuritic chest wall pain ,   Orthopnea, PND, swelling in lower extremities, anasarca, dizziness, palpitations, syncope.   GI  No heartburn, indigestion, abdominal pain, nausea, vomiting, diarrhea, change in bowel habits, loss of appetite, bloody stools. No dysphagia   Resp: No shortness of breath with exertion or at rest.  + cough and  congestion  No coughing up of blood.  No wheezing.  No chest wall deformity  Skin: no rash or lesions.  GU: no dysuria, change in color of urine, no urgency or frequency.  No flank pain, no hematuria   MS:  No joint pain or swelling.  No decreased range of motion.  No back pain.  Psych:  No change in mood or affect. No depression or anxiety.  No memory loss.      SUBJECTIVE:   VITAL SIGNS: Temp:  [98.3 F (36.8 C)] 98.3 F (36.8 C) (03/30 1309) Pulse Rate:  [79-100] 79 (03/30 2348) Resp:  [16-18] 16 (03/30 1737) BP: (125-144)/(75-93) 130/75 (03/30 2348) SpO2:  [96 %-100 %] 100 % (03/30 2348)  PHYSICAL EXAMINATION: GEN: A/Ox3; pleasant , NAD, thin male    HEENT:  [place]/AT,   THROAT-clear, no lesions, no postnasal drip or exudate noted.   NECK:  Supple w/ fair ROM; no JVD; normal carotid impulses w/o bruits; no thyromegaly or nodules palpated; no lymphadenopathy.    RESP  Decreased BS in bases R > L  no accessory muscle use, no dullness to percussion  CARD:  RRR, no m/r/g  , no peripheral edema, pulses intact, no cyanosis or clubbing.  GI:   Soft & nt; nml bowel sounds; no organomegaly or masses detected.   Musco: Warm bil, no deformities or joint swelling noted.   Neuro: alert, no focal deficits noted.    Skin: Warm, no lesions or rashes, arm tatoos      Recent Labs  Lab 08/25/17 2254  NA 134*  K 3.5  CL 98*  CO2 23  BUN 6  CREATININE 0.58*  GLUCOSE 108*   Recent Labs  Lab 08/25/17 2254  HGB 12.5*  HCT 38.7*  WBC 10.5  PLT 423*   Dg Chest 2 View  Result Date: 08/25/2017 CLINICAL DATA:  67 year old male with cough and RIGHT chest pain. EXAM: CHEST - 2 VIEW COMPARISON:  08/04/2017 chest radiograph and CT FINDINGS: Cardiomediastinal silhouette is unchanged. Slightly increased moderate RIGHT pleural effusion which may be loculated and RIGHT LOWER lung atelectasis/consolidation noted. The LEFT lung is clear. There is no evidence of pneumothorax or acute bony  abnormality. IMPRESSION: Slightly increased moderate RIGHT pleural effusion, which may be loculated, and slightly increased RIGHT LOWER lung atelectasis/consolidation. Electronically Signed   By: Margarette Canada M.D.   On: 08/25/2017 13:49   Ct Chest W Contrast  Result Date: 08/26/2017 CLINICAL DATA:  67 year old male with cough and RIGHT chest pain. RIGHT pleural effusion identified on recent chest radiograph. EXAM: CT CHEST WITH CONTRAST TECHNIQUE: Multidetector CT imaging of the chest was performed during intravenous contrast administration. CONTRAST:  121mL ISOVUE-300 IOPAMIDOL (ISOVUE-300) INJECTION 61% COMPARISON:  08/25/2017 chest radiograph and 08/04/2017 chest CT FINDINGS: Cardiovascular: UPPER limits normal heart size again noted. Coronary artery and thoracic aortic atherosclerotic calcifications are again noted. There is no evidence of thoracic aortic aneurysm or pericardial effusion. Mediastinum/Nodes: No enlarged mediastinal, hilar, or axillary lymph nodes. Thyroid gland, trachea, and esophagus demonstrate no significant findings. Lungs/Pleura: A moderate loculated RIGHT pleural  effusion has slightly enlarged, now measuring 6 x 10.5 x 10.8 cm. Adjacent RIGHT LOWER lobe consolidation/atelectasis again noted. No suspicious nodule, discrete mass, LEFT pleural effusion or pneumothorax noted. Upper Abdomen: No acute abnormality. Musculoskeletal: No chest wall abnormality. No acute or significant osseous findings. IMPRESSION: 1. Slight enlargement of moderate loculated RIGHT pleural effusion suspicious for empyema. Adjacent RIGHT LOWER lobe consolidation/atelectasis. 2. Coronary artery and Aortic Atherosclerosis (ICD10-I70.0). Electronically Signed   By: Margarette Canada M.D.   On: 08/26/2017 07:16    ASSESSMENT / PLAN:  1. RLL PNA w/ enlarging Loculated Pleural Effusion   Plan  Continue on IV abx with Vanc and Cefepime  Follow Culture data  Consult Thoracic surgery , if not VATS candidate can look at  pigtail chest tube.   2. 2 mm RUL nodule on CT chest 08/04/17 -never smoker   Plan  Would follow up with PCP OP setting with CT chest w/o contrast in 1 year .    Rexene Edison NP -C  Pulmonary and [place] Pager: [phone]  08/26/2017, 7:45 AM

## 2017-08-26 NOTE — Progress Notes (Signed)
Pharmacy Antibiotic Note  Alex [name] is a 67 y.o. male admitted on 08/25/2017 with pneumonia.  Pharmacy has been consulted for Vancomycin dosing. Recent PNA history. WBC WNL. Renal function OK. Possible loculated pleural effusion.   Plan: Vancomycin 1000 mg IV q12h Cefepime per MD Trend WBC, temp, renal function  F/U infectious work-up Drug levels as indicated  Temp (24hrs), Avg:98.3 F (36.8 C), Min:98.3 F (36.8 C), Max:98.3 F (36.8 C)  Recent Labs  Lab 08/25/17 2254  WBC 10.5  CREATININE 0.58*    CrCl cannot be calculated (Unknown ideal weight.).    No Known Allergies   Narda Bonds 08/26/2017 1:18 AM

## 2017-08-26 NOTE — H&P (Addendum)
TRH H&P   Patient Demographics:    Alex [name], is a 67 y.o. male  MRN: [id number]   DOB - 13-Mar-1951  Admit Date - 08/25/2017  Outpatient Primary MD for the patient is Antonietta Jewel, MD  Referring MD/NP/PA: Antonietta Breach  Outpatient Specialists:     Patient coming from: home  No chief complaint on file.     HPI:    Alex [name]  is a 67 y.o. male, w hypertension, carotid stenosis, CVA, apparently w recent admission for CAP on 08/04/2017 presents with c/o cough starting this am as well as right sided chest pain.  Pt denies fever, chills, sob, n/v, diarrhea, brbpr.  Pt presented to ED for evaluation.   In ED,  CXR  IMPRESSION: Slightly increased moderate RIGHT pleural effusion, which may be loculated, and slightly increased RIGHT LOWER lung atelectasis/consolidation.  Wbc 10.5, Hgb 12.5, Plt 324 Na 134, K 3.5, Bun 6, Creatinine 0.58  PH 7.431, pco2 41.4, pox 39 (venous blood gas)  Pt will be admitted for loculated effusion and pneumonia.    Review of systems:    In addition to the HPI above, No Fever-chills, No Headache, No changes with Vision or hearing, No problems swallowing food or Liquids, No  Shortness of Breath, No Abdominal pain, No Nausea or Vommitting, Bowel movements are regular, No Blood in stool or Urine, No dysuria, No new skin rashes or bruises, No new joints pains-aches,  No new weakness, tingling, numbness in any extremity, No recent weight gain or loss, No polyuria, polydypsia or polyphagia, No significant Mental Stressors.  A full 10 point Review of Systems was done, except as stated above, all other Review of Systems were negative.   With Past History of the following :    Past Medical History:  Diagnosis Date  . Atherosclerosis of arteries   . Carotid artery occlusion   . Hypertension   . Stroke [facility])       Past Surgical  History:  Procedure Laterality Date  . FOOT SURGERY        Social History:     Social History   Tobacco Use  . Smoking status: Never Smoker  . Smokeless tobacco: Never Used  Substance Use Topics  . Alcohol use: No    Alcohol/week: 0.0 oz     Lives - at home  Mobility - walks by self   Family History :     Family History  Problem Relation Age of Onset  . Heart disease Other   . Hypertension Father   . Stroke Father   . Heart disease Father   . Diabetes Sister   . Diabetes Brother       Home Medications:   Prior to Admission medications   Medication Sig Start Date End Date Taking? Authorizing Provider  aspirin 81 MG EC tablet Take 81 mg by mouth daily.  Yes [provider]  clopidogrel (PLAVIX) 75 MG tablet Take 75 mg by mouth daily.   Yes [provider]  hydrochlorothiazide (HYDRODIURIL) 25 MG tablet Take 25 mg by mouth daily.   Yes [provider]  simvastatin (ZOCOR) 10 MG tablet Take 20 mg by mouth at bedtime.    Yes [provider]     Allergies:    No Known Allergies   Physical Exam:   Vitals  Blood pressure 130/75, pulse 79, temperature 98.3 F (36.8 C), temperature source Oral, resp. rate 16, SpO2 100 %.   1. General  lying in bed in NAD,    2. Normal affect and insight, Not Suicidal or Homicidal, Awake Alert, Oriented X 3.  3. No F.N deficits, ALL C.Nerves Intact, Strength 5/5 all 4 extremities, Sensation intact all 4 extremities, Plantars down going.  4. Ears and Eyes appear Normal, Conjunctivae clear, PERRLA. Moist Oral Mucosa.  5. Supple Neck, No JVD, No cervical lymphadenopathy appriciated, No Carotid Bruits.  6. Symmetrical Chest wall movement, decrease in BS about 1/4 , no wheezing.   7. RRR, No Gallops, Rubs or Murmurs, No Parasternal Heave.  8. Positive Bowel Sounds, Abdomen Soft, No tenderness, No organomegaly appriciated,No rebound -guarding or rigidity.  9.  No Cyanosis, Normal Skin  Turgor, No Skin Rash or Bruise.  10. Good muscle tone,  joints appear normal , no effusions, Normal ROM.  11. No Palpable Lymph Nodes in Neck or Axillae     Data Review:    CBC Recent Labs  Lab 08/25/17 2254  WBC 10.5  HGB 12.5*  HCT 38.7*  PLT 423*  MCV 83.6  MCH 27.0  MCHC 32.3  RDW 13.6  LYMPHSABS 1.8  MONOABS 0.8  EOSABS 0.2  BASOSABS 0.0   ------------------------------------------------------------------------------------------------------------------  Chemistries  Recent Labs  Lab 08/25/17 2254  NA 134*  K 3.5  CL 98*  CO2 23  GLUCOSE 108*  BUN 6  CREATININE 0.58*  CALCIUM 8.9   ------------------------------------------------------------------------------------------------------------------ CrCl cannot be calculated (Unknown ideal weight.). ------------------------------------------------------------------------------------------------------------------ No results for input(s): TSH, T4TOTAL, T3FREE, THYROIDAB in the last 72 hours.  Invalid input(s): FREET3  Coagulation profile No results for input(s): INR, PROTIME in the last 168 hours. ------------------------------------------------------------------------------------------------------------------- No results for input(s): DDIMER in the last 72 hours. -------------------------------------------------------------------------------------------------------------------  Cardiac Enzymes No results for input(s): CKMB, TROPONINI, MYOGLOBIN in the last 168 hours.  Invalid input(s): CK ------------------------------------------------------------------------------------------------------------------ No results found for: BNP   ---------------------------------------------------------------------------------------------------------------  Urinalysis    Component Value Date/Time   COLORURINE YELLOW 02/15/2011 [place] 02/15/2011 0431   LABSPEC 1.025 02/15/2011 0431   PHURINE 5.5  02/15/2011 0431   GLUCOSEU NEGATIVE 02/15/2011 0431   HGBUR TRACE (A) 02/15/2011 0431   BILIRUBINUR SMALL (A) 02/15/2011 0431   KETONESUR NEGATIVE 02/15/2011 0431   PROTEINUR NEGATIVE 02/15/2011 0431   UROBILINOGEN 1.0 02/15/2011 0431   NITRITE NEGATIVE 02/15/2011 0431   LEUKOCYTESUR TRACE (A) 02/15/2011 0431    ----------------------------------------------------------------------------------------------------------------   Imaging Results:    Dg Chest 2 View  Result Date: 08/25/2017 CLINICAL DATA:  67 year old male with cough and RIGHT chest pain. EXAM: CHEST - 2 VIEW COMPARISON:  08/04/2017 chest radiograph and CT FINDINGS: Cardiomediastinal silhouette is unchanged. Slightly increased moderate RIGHT pleural effusion which may be loculated and RIGHT LOWER lung atelectasis/consolidation noted. The LEFT lung is clear. There is no evidence of pneumothorax or acute bony abnormality. IMPRESSION: Slightly increased moderate RIGHT pleural effusion, which may be loculated, and slightly increased RIGHT LOWER  lung atelectasis/consolidation. Electronically Signed   By: Margarette Canada M.D.   On: 08/25/2017 13:49       Assessment & Plan:    Principal Problem:   Pleural effusion Active Problems:   Anemia   Hyponatremia    Hcap Blood culture x2 Urine strep Urine legionella antigen Sputum gram stain and culture Check CT chest  vanco iv pharmacy to dose, cefepime iv pharmacy to dose Pulmonary consulted by ED , appreciate input (defer to pulmonary on thoracentesis vs surgical intervention) May need CT surgery to evaluate  Cp Tele Trop I q6h x3 Cardiac echo  Hyponatremia ? Due to hydrochlorothiazide Hydrate with ns iv Check cmp in am Consider further w/up with serum osm, tsh, cortisol, urine osm, urine sodium if not improving  Anemia Repeat cbc in am  CVA Cont aspirin Cont Pavix Cont simvastatin  Hypertension Cont hydrochlorothiazide for now Will need f/u bmp by pcp  Lung  nodule on prior CT chest Pt will need f/u    DVT Prophylaxis Lovenox - SCDs    AM Labs Ordered, also please review Full Orders  Family Communication: Admission, patients condition and plan of care including tests being ordered have been discussed with the patient who indicate understanding and agree with the plan and Code Status.  Code Status FULL CODE  Likely DC to  home  Condition GUARDED   Consults called: pulmonary by ED  Admission status: inpatient  Time spent in minutes : 45   Jani Gravel M.D on 08/26/2017 at 12:12 AM  Between 7am to 7pm - Pager - [phone]   After 7pm go to [email or web address] - password [email or web address]  [facility] - Office  [phone]

## 2017-08-27 ENCOUNTER — Inpatient Hospital Stay (HOSPITAL_COMMUNITY): Payer: Self-pay

## 2017-08-27 ENCOUNTER — Encounter (HOSPITAL_COMMUNITY): Payer: Self-pay | Admitting: Family Medicine

## 2017-08-27 DIAGNOSIS — R911 Solitary pulmonary nodule: Secondary | ICD-10-CM

## 2017-08-27 DIAGNOSIS — I700 Atherosclerosis of aorta: Secondary | ICD-10-CM

## 2017-08-27 DIAGNOSIS — J181 Lobar pneumonia, unspecified organism: Secondary | ICD-10-CM

## 2017-08-27 DIAGNOSIS — J90 Pleural effusion, not elsewhere classified: Secondary | ICD-10-CM

## 2017-08-27 DIAGNOSIS — J869 Pyothorax without fistula: Secondary | ICD-10-CM

## 2017-08-27 DIAGNOSIS — I351 Nonrheumatic aortic (valve) insufficiency: Secondary | ICD-10-CM

## 2017-08-27 DIAGNOSIS — I10 Essential (primary) hypertension: Secondary | ICD-10-CM

## 2017-08-27 HISTORY — DX: Atherosclerosis of aorta: I70.0

## 2017-08-27 HISTORY — DX: Lobar pneumonia, unspecified organism: J18.1

## 2017-08-27 HISTORY — DX: Pyothorax without fistula: J86.9

## 2017-08-27 HISTORY — DX: Solitary pulmonary nodule: R91.1

## 2017-08-27 HISTORY — DX: Pleural effusion, not elsewhere classified: J90

## 2017-08-27 LAB — COMPREHENSIVE METABOLIC PANEL
ALK PHOS: 55 U/L (ref 38–126)
ALT: 14 U/L — ABNORMAL LOW (ref 17–63)
ANION GAP: 10 (ref 5–15)
AST: 18 U/L (ref 15–41)
Albumin: 2.7 g/dL — ABNORMAL LOW (ref 3.5–5.0)
BILIRUBIN TOTAL: 0.7 mg/dL (ref 0.3–1.2)
BUN: 6 mg/dL (ref 6–20)
CALCIUM: 8.8 mg/dL — AB (ref 8.9–10.3)
CO2: 27 mmol/L (ref 22–32)
Chloride: 101 mmol/L (ref 101–111)
Creatinine, Ser: 0.67 mg/dL (ref 0.61–1.24)
GFR calc non Af Amer: >60 mL/min (ref >60)
Glucose, Bld: 97 mg/dL (ref 65–99)
Potassium: 3.9 mmol/L (ref 3.5–5.1)
Sodium: 138 mmol/L (ref 135–145)
TOTAL PROTEIN: 6.9 g/dL (ref 6.5–8.1)

## 2017-08-27 LAB — PREALBUMIN: Prealbumin: 14 mg/dL — ABNORMAL LOW (ref 18–38)

## 2017-08-27 LAB — TSH: TSH: 2.178 u[IU]/mL (ref 0.350–4.500)

## 2017-08-27 LAB — CBC
HEMATOCRIT: 37.1 % — AB (ref 39.0–52.0)
Hemoglobin: 11.7 g/dL — ABNORMAL LOW (ref 13.0–17.0)
MCH: 26.7 pg (ref 26.0–34.0)
MCHC: 31.5 g/dL (ref 30.0–36.0)
MCV: 84.5 fL (ref 78.0–100.0)
Platelets: 387 10*3/uL (ref 150–400)
RBC: 4.39 MIL/uL (ref 4.22–5.81)
RDW: 13.7 % (ref 11.5–15.5)
WBC: 8.9 10*3/uL (ref 4.0–10.5)

## 2017-08-27 LAB — ECHOCARDIOGRAM COMPLETE
HEIGHTINCHES: 66 in
WEIGHTICAEL: 2768 [oz_av]

## 2017-08-27 LAB — HEMOGLOBIN A1C
Hgb A1c MFr Bld: 5.7 % — ABNORMAL HIGH (ref 4.8–5.6)
Mean Plasma Glucose: 116.89 mg/dL

## 2017-08-27 LAB — PROTIME-INR
INR: 1.12
Prothrombin Time: 14.3 seconds (ref 11.4–15.2)

## 2017-08-27 LAB — PLATELET INHIBITION P2Y12: Platelet Function  P2Y12: 216 [PRU] (ref 194–418)

## 2017-08-27 NOTE — Progress Notes (Addendum)
  PROGRESS NOTE  Alex [name] KDX:[id number] DOB: 1951/03/25 DOA: 08/25/2017 PCP: Antonietta Jewel, MD  Brief Narrative: 67 year old male treated for pneumonia as an outpatient March 2019, presented with cough, right-sided chest pain.  Imaging revealed moderate right pleural effusion, possibly loculated, slightly increased right lower lung consolidation.  CT chest worrisome for empyema.  Assessment/Plan Right lower lobe pneumonia with loculated pleural effusion, empyema.  Influenza negative.  HIV nonreactive.  Strep pneumo urinary antigen negative.  No history of coronary artery disease.  No recent chest pain or shortness of breath.  Able to perform usual activities around the house without shortness of breath. --Planned VATS after Plavix washout per CT surgery --Continue empiric antibiotics --Reviewed lab preop EKG.  Telemetry sinus rhythm.  Discontinue telemetry.  2 mm right upper lobe pulmonary nodule --Follow-up with PCP, repeat CT chest March 2020  Essential hypertension --stable. Continue hydrochlorothiazide.  PMH CVA, chronic left carotid occlusion without neurologic deficit. --Continue aspirin, Lipitor.  Plavix on hold pending surgical procedure.  Aortic atherosclerosis --Continue statin   DVT prophylaxis: enoxaparin Code Status: full Family Communication: none Disposition Plan: home    Murray Hodgkins, MD  [facility] Direct contact: [phone] --Via [place]  --[email or web address]; password [email or web address]  7PM-7AM contact night coverage as above 08/27/2017, 9:13 AM  LOS: 2 days   Consultants:  Pulmonology  CT surgery  Procedures:    Antimicrobials:  Cefepime 3/30 >>  Vancomycin 3/30 >>  Interval history/Subjective: Healed fine feel fine.  No chest pain.  Breathing okay.  No nausea or vomiting.  Objective: Vitals:  Vitals:   08/27/17 0051 08/27/17 0823  BP: 110/72 113/71  Pulse: 70 79  Resp: 20 18  Temp: 98.8 F (37.1 C) 97.9 F (36.6 C)  SpO2:  98% 99%    Exam:  Constitutional:  . Appears calm and comfortable Eyes:  . pupils and irises appear normal . Normal lids  ENMT:  . grossly normal hearing  Tongue appears unremarkable Respiratory:  . Clear on the left.  Diminished breath sounds on the right side, particularly posteriorly.  No wheezes, rales or rhonchi. Marland Kitchen Respiratory effort normal.  Cardiovascular:  . RRR, no m/r/g . No LE extremity edema   Psychiatric:  . Mental status o Mood, affect appropriate . judgement and insight appear normal   I have personally reviewed the following:   Labs:  CMP pending  Hemoglobin stable, 11.7.  WBC 8.9.  Platelets within normal limits.  Imaging studies:  CT chest and chest x-ray reviewed  Scheduled Meds: . aspirin EC  81 mg Oral Daily  . enoxaparin (LOVENOX) injection  40 mg Subcutaneous Daily  . hydrochlorothiazide  25 mg Oral Daily  . simvastatin  20 mg Oral QHS   Continuous Infusions: . ceFEPime (MAXIPIME) IV Stopped (08/27/17 9024)  . vancomycin Stopped (08/27/17 0138)    Principal Problem:   Empyema (HCC) Active Problems:   Pleural effusion   Anemia   Lobar pneumonia (HCC)   Pleural effusion, right   Pulmonary nodule, right   Benign essential HTN   Aortic atherosclerosis ([place])   LOS: 2 days

## 2017-08-27 NOTE — Progress Notes (Signed)
Procedure(s) (LRB): VIDEO ASSISTED THORACOSCOPY (VATS)/EMPYEMA (Right) DECORTICATION (Right) Subjective: Right lower lobe pneumonia and empyema Platelet function suppressed by Plavix and will need 3 days of washout--plan right VATS on Thursday, April 4  Objective: Vital signs in last 24 hours: Temp:  [97.9 F (36.6 C)-98.8 F (37.1 C)] 98.1 F (36.7 C) (04/01 1645) Pulse Rate:  [70-79] 79 (04/01 1645) Cardiac Rhythm: Normal sinus rhythm (04/01 0740) Resp:  [18-20] 20 (04/01 1645) BP: (110-114)/(71-77) 114/77 (04/01 1645) SpO2:  [97 %-99 %] 97 % (04/01 1645)  Hemodynamic parameters for last 24 hours:  Stable  Intake/Output from previous day: 03/31 0701 - 04/01 0700 In: 1945 [P.O.:720; I.V.:525; IV Piggyback:700] Out: 2700 [Urine:2700] Intake/Output this shift: Total I/O In: 776 [P.O.:576; IV Piggyback:200] Out: 675 [Urine:675]  Exam Nontoxic Diminished breath sounds right base  Lab Results: Recent Labs    08/25/17 2254 08/27/17 0708  WBC 10.5 8.9  HGB 12.5* 11.7*  HCT 38.7* 37.1*  PLT 423* 387   BMET:  Recent Labs    08/25/17 2254 08/27/17 0708  NA 134* 138  K 3.5 3.9  CL 98* 101  CO2 23 27  GLUCOSE 108* 97  BUN 6 6  CREATININE 0.58* 0.67  CALCIUM 8.9 8.8*    PT/INR:  Recent Labs    08/27/17 0708  LABPROT 14.3  INR 1.12   ABG    Component Value Date/Time   HCO3 27.6 08/25/2017 2303   TCO2 29 08/25/2017 2303   O2SAT 75.0 08/25/2017 2303   CBG (last 3)  No results for input(s): GLUCAP in the last 72 hours.  Assessment/Plan: S/P Procedure(s) (LRB): VIDEO ASSISTED THORACOSCOPY (VATS)/EMPYEMA (Right) DECORTICATION (Right) Plavix washout with right VATS planned April 4   LOS: 2 days    Tharon Aquas Trigt III 08/27/2017

## 2017-08-27 NOTE — Progress Notes (Signed)
  Echocardiogram 2D Echocardiogram has been performed.  Merrie Roof F 08/27/2017, 12:43 PM

## 2017-08-28 LAB — BASIC METABOLIC PANEL
Anion gap: 9 (ref 5–15)
BUN: 9 mg/dL (ref 6–20)
CHLORIDE: 98 mmol/L — AB (ref 101–111)
CO2: 27 mmol/L (ref 22–32)
Calcium: 8.6 mg/dL — ABNORMAL LOW (ref 8.9–10.3)
Creatinine, Ser: 0.67 mg/dL (ref 0.61–1.24)
GFR calc Af Amer: >60 mL/min (ref >60)
GFR calc non Af Amer: >60 mL/min (ref >60)
GLUCOSE: 137 mg/dL — AB (ref 65–99)
POTASSIUM: 3.4 mmol/L — AB (ref 3.5–5.1)
Sodium: 134 mmol/L — ABNORMAL LOW (ref 135–145)

## 2017-08-28 LAB — LEGIONELLA PNEUMOPHILA SEROGP 1 UR AG: L. PNEUMOPHILA SEROGP 1 UR AG: NEGATIVE

## 2017-08-28 MED ORDER — POTASSIUM CHLORIDE CRYS ER 20 MEQ PO TBCR: 40.0000 meq | EXTENDED_RELEASE_TABLET | Freq: Once | ORAL | Status: DC

## 2017-08-28 NOTE — Progress Notes (Addendum)
  PROGRESS NOTE  Alex [name] [id number] DOB: May 26, 1951 DOA: 08/25/2017 PCP: Antonietta Jewel, MD  Brief Narrative: 67 year old male treated for pneumonia as an outpatient March 2019, presented with cough, right-sided chest pain.  Imaging revealed moderate right pleural effusion, possibly loculated, slightly increased right lower lung consolidation.  CT chest worrisome for empyema.  Assessment/Plan Right lower lobe pneumonia with loculated pleural effusion, empyema.  Influenza negative.  HIV nonreactive.  Strep pneumo urinary antigen negative.  No history of coronary artery disease.  --Planned VATS after Plavix washout per CT surgery on Thursday.  --Continue empiric antibiotics  2 mm right upper lobe pulmonary nodule --Follow-up with PCP, repeat CT chest March 2020  Essential hypertension --stable. Hold hydrochlorothiazide due to hyponatremia and SBP in the 110 range. Marland Kitchen  PMH CVA, chronic left carotid occlusion without neurologic deficit. --Continue aspirin, Lipitor.  Plavix on hold pending surgical procedure.  Aortic atherosclerosis --Continue statin  Hypokalemia; replete orally.   Mild hyponatremia; monitor. Hold HCTZ.   DVT prophylaxis: enoxaparin Code Status: full Family Communication: none Disposition Plan: home    Niel Hummer, MD  [facility] Direct contact: [phone] --Via amion app OR  --[email or web address]; password [email or web address]  7PM-7AM contact night coverage as above  08/28/2017, 3:37 PM  LOS: 3 days   Consultants:  Pulmonology  CT surgery  Procedures:    Antimicrobials:  Cefepime 3/30 >>  Vancomycin 3/30 >>  Interval history/Subjective: He is feeling well, denies dyspnea, chest pain.   Objective: Vitals:  Vitals:   08/28/17 0030 08/28/17 0805  BP: 109/73 119/74  Pulse: 74 77  Resp: 16 18  Temp: 98.2 F (36.8 C) 98.1 F (36.7 C)  SpO2: 97% 97%    Exam:  Constitutional:  . NAD Eyes:  Marland Kitchen Normal lids.  ENMT:  MMM Respiratory:   . Normal respiratory effort, crackles right, decreased breath sound.  Cardiovascular:  . S 1, S 2 RRR              No LE edema Psychiatric:  . Mental status . Mood and affect appropriate.    I have personally reviewed the following:   Labs:  CMP k 3.4.   Sodium 134.   Imaging studies:  CT chest and chest x-ray reviewed  Scheduled Meds: . aspirin EC  81 mg Oral Daily  . enoxaparin (LOVENOX) injection  40 mg Subcutaneous Daily  . hydrochlorothiazide  25 mg Oral Daily  . simvastatin  20 mg Oral QHS   Continuous Infusions: . ceFEPime (MAXIPIME) IV Stopped (08/28/17 0836)  . vancomycin Stopped (08/28/17 1408)    Principal Problem:   Empyema (HCC) Active Problems:   Pleural effusion   Anemia   Lobar pneumonia (HCC)   Pleural effusion, right   Pulmonary nodule, right   Benign essential HTN   Aortic atherosclerosis ([place])   LOS: 3 days

## 2017-08-28 NOTE — Care Management Note (Signed)
Case Management Note  Patient Details  Name: Alex [name] MRN: [id number] Date of Birth: Jan 13, 1951  Subjective/Objective:                 Patient from home, lives with son. Independent pta, drives. PCP Dr Sheryle Hail in [place]. Uses [facility] it is a low cost pharmacy, he denies any barriers obtaining medications.     Action/Plan:   Expected Discharge Date:                  Expected Discharge Plan:  Home/Self Care  In-House Referral:     Discharge planning Services  CM Consult  Post Acute Care Choice:    Choice offered to:     DME Arranged:    DME Agency:     HH Arranged:    HH Agency:     Status of Service:  In process, will continue to follow  If discussed at Long Length of Stay Meetings, dates discussed:    Additional Comments:  Carles Collet, RN 08/28/2017, 11:48 AM

## 2017-08-29 LAB — PROTIME-INR
INR: 1.09
Prothrombin Time: 14 seconds (ref 11.4–15.2)

## 2017-08-29 LAB — BASIC METABOLIC PANEL
ANION GAP: 13 (ref 5–15)
BUN: 9 mg/dL (ref 6–20)
CALCIUM: 8.6 mg/dL — AB (ref 8.9–10.3)
CO2: 26 mmol/L (ref 22–32)
CREATININE: 0.61 mg/dL (ref 0.61–1.24)
Chloride: 95 mmol/L — ABNORMAL LOW (ref 101–111)
GFR calc non Af Amer: >60 mL/min (ref >60)
Glucose, Bld: 102 mg/dL — ABNORMAL HIGH (ref 65–99)
Potassium: 3.6 mmol/L (ref 3.5–5.1)
SODIUM: 134 mmol/L — AB (ref 135–145)

## 2017-08-29 LAB — CBC
HCT: 35.6 % — ABNORMAL LOW (ref 39.0–52.0)
HCT: 36.9 % — ABNORMAL LOW (ref 39.0–52.0)
Hemoglobin: 11.4 g/dL — ABNORMAL LOW (ref 13.0–17.0)
Hemoglobin: 11.7 g/dL — ABNORMAL LOW (ref 13.0–17.0)
MCH: 26.9 pg (ref 26.0–34.0)
MCH: 26.6 pg (ref 26.0–34.0)
MCHC: 32 g/dL (ref 30.0–36.0)
MCHC: 31.7 g/dL (ref 30.0–36.0)
MCV: 84 fL (ref 78.0–100.0)
MCV: 83.9 fL (ref 78.0–100.0)
Platelets: 400 10*3/uL (ref 150–400)
Platelets: 387 10*3/uL (ref 150–400)
RBC: 4.24 MIL/uL (ref 4.22–5.81)
RBC: 4.4 MIL/uL (ref 4.22–5.81)
RDW: 14.1 % (ref 11.5–15.5)
RDW: 13.7 % (ref 11.5–15.5)
WBC: 10.6 10*3/uL — AB (ref 4.0–10.5)
WBC: 10.9 10*3/uL — ABNORMAL HIGH (ref 4.0–10.5)

## 2017-08-29 LAB — URINALYSIS, ROUTINE W REFLEX MICROSCOPIC
Bilirubin Urine: NEGATIVE
Glucose, UA: NEGATIVE mg/dL
Hgb urine dipstick: NEGATIVE
Ketones, ur: NEGATIVE mg/dL
Leukocytes, UA: NEGATIVE
Nitrite: NEGATIVE
Protein, ur: NEGATIVE mg/dL
Specific Gravity, Urine: 1.018 (ref 1.005–1.030)
pH: 5 (ref 5.0–8.0)

## 2017-08-29 LAB — COMPREHENSIVE METABOLIC PANEL
ALT: 15 U/L — ABNORMAL LOW (ref 17–63)
AST: 20 U/L (ref 15–41)
Albumin: 2.7 g/dL — ABNORMAL LOW (ref 3.5–5.0)
Alkaline Phosphatase: 56 U/L (ref 38–126)
Anion gap: 8 (ref 5–15)
BUN: 8 mg/dL (ref 6–20)
CO2: 28 mmol/L (ref 22–32)
Calcium: 8.7 mg/dL — ABNORMAL LOW (ref 8.9–10.3)
Chloride: 100 mmol/L — ABNORMAL LOW (ref 101–111)
Creatinine, Ser: 0.64 mg/dL (ref 0.61–1.24)
GFR calc Af Amer: >60 mL/min (ref >60)
GFR calc non Af Amer: >60 mL/min (ref >60)
Glucose, Bld: 137 mg/dL — ABNORMAL HIGH (ref 65–99)
Potassium: 3.5 mmol/L (ref 3.5–5.1)
Sodium: 136 mmol/L (ref 135–145)
Total Bilirubin: 0.7 mg/dL (ref 0.3–1.2)
Total Protein: 6.6 g/dL (ref 6.5–8.1)

## 2017-08-29 LAB — PREPARE RBC (CROSSMATCH)

## 2017-08-29 LAB — APTT: aPTT: 36 seconds (ref 24–36)

## 2017-08-29 LAB — ABO/RH: ABO/RH(D): A POS

## 2017-08-29 MED ORDER — CEFAZOLIN SODIUM-DEXTROSE 2-4 GM/100ML-% IV SOLN: 2.0000 g | INTRAVENOUS | Status: DC

## 2017-08-29 MED ORDER — CEFAZOLIN SODIUM-DEXTROSE 2-4 GM/100ML-% IV SOLN
2.0000 g | INTRAVENOUS | Status: AC
  Administered 2017-08-30: 2 g via INTRAVENOUS
  Filled 2017-08-29: qty 100

## 2017-08-29 NOTE — Progress Notes (Signed)
PROGRESS NOTE    Alex [name]  XBL:[id number] DOB: 27-Sep-1950 DOA: 08/25/2017 PCP: Antonietta Jewel, MD   Brief Narrative: Alex [name] is a 67 y.o. male with a history of hypertension, CVA, carotid stenosis, aortic atherosclerosis. He presented with cough and right-sided chest pain found to have pneumonia with an associated empyema. He has improved on antibiotics and is awaiting VATS by CT surgery.   Assessment & Plan:   Principal Problem:   Empyema ([place]) Active Problems:   Pleural effusion   Anemia   Lobar pneumonia (HCC)   Pleural effusion, right   Pulmonary nodule, right   Benign essential HTN   Aortic atherosclerosis (HCC)   Right lower lobe pneumonia Loculated pleural effusion/empyema Improved on current antibiotic regimen. Afebrile. -Continue Vancomycin/Cefepime -CT surgery recommendations: VATS planned for 4/4  Pulmonary nodule 2 mm, right upper lobe. Will need outpatient follow-up with repeat chest CT  Essential hypertension Well controlled. Hydrochlorothiazide discontinued in setting of controlled blood pressure and mild hyponatremia. -Continue to hold hydrochlorothiazide   History of CVA Chronic left carotid occlusion Aortic atherosclerosis Stable. -Continue aspirin and Lipitor -Continue to hold Plavix secondary to planned surgery  Hypokalemia Resolved with supplementation  Hyponatremia Thought secondary to hydrochlorothiazide. Stable.   DVT prophylaxis: Lovenox Code Status:   Code Status: Full Code Family Communication: None at bedside Disposition Plan: Discharge pending VATS and CT surgery recommendations   Consultants:   CT surgery  Pulmonology  Procedures:   None  Antimicrobials:  Vancomycin (3/30>>  Cefepime (3/30>>    Subjective: No chest pain or dyspnea. Cough overnight that is worse with lying flat.   Objective: Vitals:   08/28/17 0805 08/28/17 1700 08/28/17 2332 08/29/17 0725  BP: 119/74 123/75 116/78 111/76    Pulse: 77 82 72 72  Resp: 18 18 16 18   Temp: 98.1 F (36.7 C) 98.1 F (36.7 C) 98.1 F (36.7 C) 98.3 F (36.8 C)  TempSrc: Oral Oral Oral Oral  SpO2: 97% 99% 98% 97%  Weight:      Height:        Intake/Output Summary (Last 24 hours) at 08/29/2017 0823 Last data filed at 08/29/2017 0300 Gross per 24 hour  Intake 900 ml  Output 300 ml  Net 600 ml   Filed Weights   08/26/17 0049  Weight: 78.5 kg (173 lb)    Examination:  General exam: Appears calm and comfortable Respiratory system: Diminished on right side compared to left, otherwise clear. Respiratory effort normal. Cardiovascular system: S1 & S2 heard, RRR. No murmurs, rubs, gallops or clicks. Gastrointestinal system: Abdomen is nondistended, soft and nontender. No organomegaly or masses felt. Normal bowel sounds heard. Central nervous system: Alert and oriented. No focal neurological deficits. Extremities: No edema. No calf tenderness Skin: No cyanosis. No rashes Psychiatry: Judgement and insight appear normal. Mood & affect appropriate.     Data Reviewed: I have personally reviewed following labs and imaging studies  CBC: Recent Labs  Lab 08/25/17 2254 08/27/17 0708 08/29/17 0504  WBC 10.5 8.9 10.6*  NEUTROABS 7.8*  --   --   HGB 12.5* 11.7* 11.4*  HCT 38.7* 37.1* 35.6*  MCV 83.6 84.5 84.0  PLT 423* 387 300   Basic Metabolic Panel: Recent Labs  Lab 08/25/17 2254 08/27/17 0708 08/28/17 0848 08/29/17 0504  NA 134* 138 134* 134*  K 3.5 3.9 3.4* 3.6  CL 98* 101 98* 95*  CO2 23 27 27 26   GLUCOSE 108* 97 137* 102*  BUN 6 6 9  9  CREATININE 0.58* 0.67 0.67 0.61  CALCIUM 8.9 8.8* 8.6* 8.6*   GFR: Estimated Creatinine Clearance: 89.5 mL/min (by C-G formula based on SCr of 0.61 mg/dL). Liver Function Tests: Recent Labs  Lab 08/27/17 0708  AST 18  ALT 14*  ALKPHOS 55  BILITOT 0.7  PROT 6.9  ALBUMIN 2.7*   No results for input(s): LIPASE, AMYLASE in the last 168 hours. No results for input(s):  AMMONIA in the last 168 hours. Coagulation Profile: Recent Labs  Lab 08/27/17 0708  INR 1.12   Cardiac Enzymes: Recent Labs  Lab 08/26/17 0230 08/26/17 0838 08/26/17 1245  TROPONINI <0.03 <0.03 <0.03   BNP (last 3 results) No results for input(s): PROBNP in the last 8760 hours. HbA1C: Recent Labs    08/27/17 0708  HGBA1C 5.7*   CBG: No results for input(s): GLUCAP in the last 168 hours. Lipid Profile: No results for input(s): CHOL, HDL, LDLCALC, TRIG, CHOLHDL, LDLDIRECT in the last 72 hours. Thyroid Function Tests: Recent Labs    08/27/17 0708  TSH 2.178   Anemia Panel: No results for input(s): VITAMINB12, FOLATE, FERRITIN, TIBC, IRON, RETICCTPCT in the last 72 hours. Sepsis Labs: No results for input(s): PROCALCITON, LATICACIDVEN in the last 168 hours.  Recent Results (from the past 240 hour(s))  Culture, blood (routine x 2) Call MD if unable to obtain prior to antibiotics being given     Status: None (Preliminary result)   Collection Time: 08/26/17  2:30 AM  Result Value Ref Range Status   Specimen Description BLOOD LEFT ARM  Final   Special Requests   Final    BOTTLES DRAWN AEROBIC AND ANAEROBIC Blood Culture adequate volume   Culture   Final    NO GROWTH 2 DAYS Performed at [facility], [address], [place], [place] [address]    Report Status PENDING  Incomplete  Culture, blood (routine x 2) Call MD if unable to obtain prior to antibiotics being given     Status: None (Preliminary result)   Collection Time: 08/26/17  2:40 AM  Result Value Ref Range Status   Specimen Description BLOOD RIGHT ARM  Final   Special Requests   Final    BOTTLES DRAWN AEROBIC ONLY Blood Culture adequate volume   Culture   Final    NO GROWTH 2 DAYS Performed at [facility], [address], [place], [place] [address]    Report Status PENDING  Incomplete  Culture, sputum-assessment     Status: None   Collection Time: 08/26/17  4:00 AM  Result Value Ref Range  Status   Specimen Description SPUTUM  Final   Special Requests Normal  Final   Sputum evaluation   Final    Sputum specimen not acceptable for testing.  Please recollect.   Gram Stain Report Called to,Read Back By and Verified With: C STAMPER RN AT 0801 ON [id number] BY SJW Performed at [facility], [place] [address], [place], [place] [address]    Report Status 08/26/2017 FINAL  Final  Surgical pcr screen     Status: None   Collection Time: 08/26/17  6:00 PM  Result Value Ref Range Status   MRSA, PCR NEGATIVE NEGATIVE Final   Staphylococcus aureus NEGATIVE NEGATIVE Final    Comment: (NOTE) The Xpert SA Assay (FDA approved for NASAL specimens in patients 67 years of age and older), is one component of a comprehensive surveillance program. It is not intended to diagnose infection nor to guide or monitor  treatment. Performed at [facility], [place] [address] [place], [place] [address]          Radiology Studies: No results found.      Scheduled Meds: . aspirin EC  81 mg Oral Daily  . enoxaparin (LOVENOX) injection  40 mg Subcutaneous Daily  . potassium chloride  40 mEq Oral Once  . simvastatin  20 mg Oral QHS   Continuous Infusions: . ceFEPime (MAXIPIME) IV 1 g (08/29/17 0806)  . vancomycin Stopped (08/29/17 0230)     LOS: 4 days     Cordelia Poche, MD [facility] 08/29/2017, 8:23 AM Pager: [phone]  If 7PM-7AM, please contact night-coverage [email or web address] Password [facility] 08/29/2017, 8:23 AM

## 2017-08-29 NOTE — Progress Notes (Signed)
Procedure(s) (LRB): VIDEO ASSISTED THORACOSCOPY (VATS)/EMPYEMA (Right) DECORTICATION (Right) Subjective: Patient stable with right empyema in the lower right pleural space Plan right VATS for drainage and decortication of right lower lobe tomorrow Procedure discussed with patient including benefits and risks.  Objective: Vital signs in last 24 hours: Temp:  [98.1 F (36.7 C)-98.3 F (36.8 C)] 98.3 F (36.8 C) (04/03 0725) Pulse Rate:  [72-82] 72 (04/03 0725) Cardiac Rhythm: Normal sinus rhythm (04/03 0730) Resp:  [16-18] 18 (04/03 0725) BP: (111-123)/(75-78) 111/76 (04/03 0725) SpO2:  [97 %-99 %] 97 % (04/03 0725)  Hemodynamic parameters for last 24 hours:  Stable  Intake/Output from previous day: 04/02 0701 - 04/03 0700 In: 1240 [P.O.:540; IV Piggyback:700] Out: 300 [Urine:300] Intake/Output this shift: Total I/O In: 340 [P.O.:240; IV Piggyback:100] Out: -     Lab Results: Recent Labs    08/29/17 0504 08/29/17 0956  WBC 10.6* 10.9*  HGB 11.4* 11.7*  HCT 35.6* 36.9*  PLT 400 387   BMET:  Recent Labs    08/29/17 0504 08/29/17 0956  NA 134* 136  K 3.6 3.5  CL 95* 100*  CO2 26 28  GLUCOSE 102* 137*  BUN 9 8  CREATININE 0.61 0.64  CALCIUM 8.6* 8.7*    PT/INR:  Recent Labs    08/29/17 0956  LABPROT 14.0  INR 1.09   ABG    Component Value Date/Time   HCO3 27.6 08/25/2017 2303   TCO2 29 08/25/2017 2303   O2SAT 75.0 08/25/2017 2303   CBG (last 3)  No results for input(s): GLUCAP in the last 72 hours.  Assessment/Plan: S/P Procedure(s) (LRB): VIDEO ASSISTED THORACOSCOPY (VATS)/EMPYEMA (Right) DECORTICATION (Right) Orders placed for right VATS for drainage of empyema tomorrow midday.  Patient will go to ICU postop.   LOS: 4 days    Tharon Aquas Trigt III 08/29/2017

## 2017-08-29 NOTE — Progress Notes (Signed)
Pharmacy Antibiotic Note  Alex [name] is a 67 y.o. male admitted on 08/25/2017 with pneumonia.  Pharmacy has been consulted for Vancomycin dosing.   Day #4 of abx for PNA? Recent PNA hx, WBC wnl. CT - R-pleural effusion, suspicious for empyema.  Going for VATS on 4/4. Afebrile, WBC 10.6.  Plan: Continue vancomycin 1000 mg IV q12h Continue cefepime 1g IV Q8h Monitor clinical picture, renal function, VT prn F/U C&S, abx deescalation / LOT  Temp (24hrs), Avg:98.2 F (36.8 C), Min:98.1 F (36.7 C), Max:98.3 F (36.8 C)  Recent Labs  Lab 08/25/17 2254 08/27/17 0708 08/28/17 0848 08/29/17 0504  WBC 10.5 8.9  --  10.6*  CREATININE 0.58* 0.67 0.67 0.61    Estimated Creatinine Clearance: 89.5 mL/min (by C-G formula based on SCr of 0.61 mg/dL).    No Known Allergies   Reginia Naas 08/29/2017 8:17 AM

## 2017-08-30 ENCOUNTER — Encounter (HOSPITAL_COMMUNITY): Payer: Self-pay | Admitting: Anesthesiology

## 2017-08-30 ENCOUNTER — Inpatient Hospital Stay (HOSPITAL_COMMUNITY): Payer: Self-pay

## 2017-08-30 ENCOUNTER — Inpatient Hospital Stay (HOSPITAL_COMMUNITY): Payer: Self-pay | Admitting: Anesthesiology

## 2017-08-30 ENCOUNTER — Encounter (HOSPITAL_COMMUNITY)
Admission: EM | Disposition: A | Discharge status: Home / Self Care | Payer: Self-pay | Source: Home / Self Care | Attending: Cardiothoracic Surgery

## 2017-08-30 DIAGNOSIS — I10 Essential (primary) hypertension: Secondary | ICD-10-CM

## 2017-08-30 DIAGNOSIS — J869 Pyothorax without fistula: Secondary | ICD-10-CM

## 2017-08-30 DIAGNOSIS — J181 Lobar pneumonia, unspecified organism: Secondary | ICD-10-CM

## 2017-08-30 DIAGNOSIS — R911 Solitary pulmonary nodule: Secondary | ICD-10-CM

## 2017-08-30 DIAGNOSIS — I700 Atherosclerosis of aorta: Secondary | ICD-10-CM

## 2017-08-30 HISTORY — PX: VIDEO ASSISTED THORACOSCOPY (VATS)/EMPYEMA: SHX6172

## 2017-08-30 HISTORY — PX: DECORTICATION: SHX5101

## 2017-08-30 HISTORY — DX: Pyothorax without fistula: J86.9

## 2017-08-30 LAB — POCT I-STAT 3, ART BLOOD GAS (G3+)
Bicarbonate: 26.4 mmol/L (ref 20.0–28.0)
O2 Saturation: 97 %
PCO2 ART: 49.2 mmHg — AB (ref 32.0–48.0)
Patient temperature: 97.8
TCO2: 28 mmol/L (ref 22–32)
pH, Arterial: 7.335 — ABNORMAL LOW (ref 7.350–7.450)
pO2, Arterial: 92 mmHg (ref 83.0–108.0)

## 2017-08-30 LAB — BLOOD GAS, ARTERIAL
Acid-Base Excess: 3.6 mmol/L — ABNORMAL HIGH (ref 0.0–2.0)
Bicarbonate: 27.8 mmol/L (ref 20.0–28.0)
Drawn by: 511471
FIO2: 21
O2 Saturation: 95.4 %
Patient temperature: 98.6
pCO2 arterial: 43.6 mmHg (ref 32.0–48.0)
pH, Arterial: 7.421 (ref 7.350–7.450)
pO2, Arterial: 79.2 mmHg — ABNORMAL LOW (ref 83.0–108.0)

## 2017-08-30 SURGERY — VIDEO ASSISTED THORACOSCOPY (VATS)/EMPYEMA
Anesthesia: General | Site: Chest | Laterality: Right

## 2017-08-30 MED ORDER — ROCURONIUM BROMIDE 100 MG/10ML IV SOLN
INTRAVENOUS | Status: DC | PRN
  Administered 2017-08-30: 60 mg via INTRAVENOUS

## 2017-08-30 MED ORDER — PROPOFOL 10 MG/ML IV BOLUS
INTRAVENOUS | Status: DC | PRN
  Administered 2017-08-30: 50 mg via INTRAVENOUS
  Administered 2017-08-30: 150 mg via INTRAVENOUS

## 2017-08-30 MED ORDER — HEMOSTATIC AGENTS (NO CHARGE) OPTIME
TOPICAL | Status: DC | PRN
  Administered 2017-08-30: 1 via TOPICAL

## 2017-08-30 MED ORDER — INFLUENZA VAC SPLIT HIGH-DOSE 0.5 ML IM SUSY: 0.5000 mL | PREFILLED_SYRINGE | INTRAMUSCULAR | Status: DC | PRN

## 2017-08-30 MED ORDER — MEPERIDINE HCL 50 MG/ML IJ SOLN: 6.2500 mg | INTRAMUSCULAR | Status: DC | PRN

## 2017-08-30 MED ORDER — SUGAMMADEX SODIUM 200 MG/2ML IV SOLN
INTRAVENOUS | Status: DC | PRN
  Administered 2017-08-30: 157 mg via INTRAVENOUS

## 2017-08-30 MED ORDER — KETOROLAC TROMETHAMINE 30 MG/ML IJ SOLN
INTRAMUSCULAR | Status: DC | PRN
  Administered 2017-08-30: 30 mg via INTRAVENOUS

## 2017-08-30 MED ORDER — PROPOFOL 10 MG/ML IV BOLUS
INTRAVENOUS | Status: AC
  Filled 2017-08-30: qty 40

## 2017-08-30 MED ORDER — BUPIVACAINE HCL (PF) 0.5 % IJ SOLN
INTRAMUSCULAR | Status: AC
  Filled 2017-08-30: qty 10

## 2017-08-30 MED ORDER — PNEUMOCOCCAL VAC POLYVALENT 25 MCG/0.5ML IJ INJ
0.5000 mL | INJECTION | INTRAMUSCULAR | Status: DC | PRN
Start: 2017-08-30 — End: 2017-09-04

## 2017-08-30 MED ORDER — POTASSIUM CHLORIDE 10 MEQ/50ML IV SOLN: 10.0000 meq | Freq: Every day | INTRAVENOUS | Status: DC | PRN

## 2017-08-30 MED ORDER — SODIUM CHLORIDE 0.9% FLUSH: 9.0000 mL | INTRAVENOUS | Status: DC | PRN

## 2017-08-30 MED ORDER — PHENYLEPHRINE 40 MCG/ML (10ML) SYRINGE FOR IV PUSH (FOR BLOOD PRESSURE SUPPORT)
PREFILLED_SYRINGE | INTRAVENOUS | Status: DC | PRN
  Administered 2017-08-30: 80 ug via INTRAVENOUS

## 2017-08-30 MED ORDER — 0.9 % SODIUM CHLORIDE (POUR BTL) OPTIME
TOPICAL | Status: DC | PRN
  Administered 2017-08-30: 1000 mL

## 2017-08-30 MED ORDER — ASPIRIN EC 81 MG PO TBEC
81.0000 mg | DELAYED_RELEASE_TABLET | Freq: Every day | ORAL | Status: DC
  Administered 2017-08-31 – 2017-09-04 (×5): 81 mg via ORAL
  Filled 2017-08-30 (×5): qty 1

## 2017-08-30 MED ORDER — LACTATED RINGERS IV SOLN
INTRAVENOUS | Status: DC | PRN
  Administered 2017-08-30: 13:00:00 via INTRAVENOUS

## 2017-08-30 MED ORDER — DIPHENHYDRAMINE HCL 12.5 MG/5ML PO ELIX: 12.5000 mg | ORAL_SOLUTION | Freq: Four times a day (QID) | ORAL | Status: DC | PRN

## 2017-08-30 MED ORDER — LACTATED RINGERS IV SOLN: INTRAVENOUS | Status: DC

## 2017-08-30 MED ORDER — ACETAMINOPHEN 160 MG/5ML PO SOLN: 1000.0000 mg | Freq: Four times a day (QID) | ORAL | Status: DC

## 2017-08-30 MED ORDER — HYDROMORPHONE HCL 1 MG/ML IJ SOLN
INTRAMUSCULAR | Status: AC
  Filled 2017-08-30: qty 1

## 2017-08-30 MED ORDER — ENOXAPARIN SODIUM 40 MG/0.4ML ~~LOC~~ SOLN
40.0000 mg | Freq: Every day | SUBCUTANEOUS | Status: DC
  Administered 2017-08-31 – 2017-09-03 (×4): 40 mg via SUBCUTANEOUS
  Filled 2017-08-30 (×5): qty 0.4

## 2017-08-30 MED ORDER — DIPHENHYDRAMINE HCL 50 MG/ML IJ SOLN: 12.5000 mg | Freq: Four times a day (QID) | INTRAMUSCULAR | Status: DC | PRN

## 2017-08-30 MED ORDER — FENTANYL 40 MCG/ML IV SOLN
INTRAVENOUS | Status: DC
  Administered 2017-08-30: 1000 ug via INTRAVENOUS
  Administered 2017-08-30: 180 ug via INTRAVENOUS
  Administered 2017-08-31: 60 ug via INTRAVENOUS
  Administered 2017-08-31: 2000 ug via INTRAVENOUS
  Administered 2017-08-31: 120 ug via INTRAVENOUS
  Administered 2017-08-31: 323 ug via INTRAVENOUS
  Administered 2017-08-31: 195 ug via INTRAVENOUS
  Administered 2017-09-01: 135 ug via INTRAVENOUS
  Administered 2017-09-01: 120 ug via INTRAVENOUS
  Administered 2017-09-01: 60 ug via INTRAVENOUS
  Administered 2017-09-02: 45 ug via INTRAVENOUS
  Filled 2017-08-30 (×3): qty 25

## 2017-08-30 MED ORDER — NALOXONE HCL 0.4 MG/ML IJ SOLN
0.4000 mg | INTRAMUSCULAR | Status: DC | PRN
Start: 2017-08-30 — End: 2017-09-02

## 2017-08-30 MED ORDER — BISACODYL 5 MG PO TBEC
10.0000 mg | DELAYED_RELEASE_TABLET | Freq: Every day | ORAL | Status: DC
  Administered 2017-08-31 – 2017-09-04 (×5): 10 mg via ORAL
  Filled 2017-08-30 (×5): qty 2

## 2017-08-30 MED ORDER — TRAMADOL HCL 50 MG PO TABS: 50.0000 mg | ORAL_TABLET | Freq: Four times a day (QID) | ORAL | Status: DC | PRN

## 2017-08-30 MED ORDER — BUPIVACAINE 0.5 % ON-Q PUMP SINGLE CATH 400 ML
INJECTION | Status: AC | PRN
  Administered 2017-08-30: 400 mL

## 2017-08-30 MED ORDER — ONDANSETRON HCL 4 MG/2ML IJ SOLN: 4.0000 mg | Freq: Four times a day (QID) | INTRAMUSCULAR | Status: DC | PRN

## 2017-08-30 MED ORDER — ONDANSETRON HCL 4 MG/2ML IJ SOLN
INTRAMUSCULAR | Status: DC | PRN
  Administered 2017-08-30: 4 mg via INTRAVENOUS

## 2017-08-30 MED ORDER — SIMVASTATIN 20 MG PO TABS
20.0000 mg | ORAL_TABLET | Freq: Every day | ORAL | Status: DC
  Administered 2017-08-30 – 2017-09-03 (×5): 20 mg via ORAL
  Filled 2017-08-30 (×5): qty 1

## 2017-08-30 MED ORDER — PHENYLEPHRINE HCL 10 MG/ML IJ SOLN
INTRAVENOUS | Status: DC | PRN
  Administered 2017-08-30: 25 ug/min via INTRAVENOUS

## 2017-08-30 MED ORDER — PROMETHAZINE HCL 25 MG/ML IJ SOLN: 6.2500 mg | INTRAMUSCULAR | Status: DC | PRN

## 2017-08-30 MED ORDER — MIDAZOLAM HCL 2 MG/2ML IJ SOLN
INTRAMUSCULAR | Status: AC
  Filled 2017-08-30: qty 2

## 2017-08-30 MED ORDER — BUPIVACAINE HCL (PF) 0.5 % IJ SOLN
INTRAMUSCULAR | Status: DC | PRN
  Administered 2017-08-30: 50 mg

## 2017-08-30 MED ORDER — ACETAMINOPHEN 500 MG PO TABS
1000.0000 mg | ORAL_TABLET | Freq: Four times a day (QID) | ORAL | Status: DC
  Administered 2017-08-30 – 2017-09-03 (×16): 1000 mg via ORAL
  Filled 2017-08-30 (×14): qty 2

## 2017-08-30 MED ORDER — HYDROMORPHONE HCL 1 MG/ML IJ SOLN
0.2500 mg | INTRAMUSCULAR | Status: DC | PRN
  Administered 2017-08-30 (×4): 0.5 mg via INTRAVENOUS

## 2017-08-30 MED ORDER — OXYCODONE HCL 5 MG PO TABS
5.0000 mg | ORAL_TABLET | ORAL | Status: DC | PRN
  Administered 2017-09-02 – 2017-09-03 (×5): 10 mg via ORAL
  Administered 2017-09-03: 5 mg via ORAL
  Filled 2017-08-30 (×2): qty 2
  Filled 2017-08-30: qty 1
  Filled 2017-08-30 (×2): qty 2

## 2017-08-30 MED ORDER — SENNOSIDES-DOCUSATE SODIUM 8.6-50 MG PO TABS
1.0000 | ORAL_TABLET | Freq: Every day | ORAL | Status: DC
  Administered 2017-08-31 – 2017-09-03 (×4): 1 via ORAL
  Filled 2017-08-30 (×4): qty 1

## 2017-08-30 MED ORDER — FENTANYL CITRATE (PF) 250 MCG/5ML IJ SOLN
INTRAMUSCULAR | Status: AC
  Filled 2017-08-30: qty 5

## 2017-08-30 MED ORDER — FENTANYL CITRATE (PF) 100 MCG/2ML IJ SOLN: 25.0000 ug | INTRAMUSCULAR | Status: DC | PRN

## 2017-08-30 MED ORDER — LIDOCAINE HCL (CARDIAC) 20 MG/ML IV SOLN
INTRAVENOUS | Status: DC | PRN
  Administered 2017-08-30: 50 mg via INTRAVENOUS

## 2017-08-30 MED ORDER — FENTANYL CITRATE (PF) 100 MCG/2ML IJ SOLN
INTRAMUSCULAR | Status: DC | PRN
  Administered 2017-08-30: 50 ug via INTRAVENOUS
  Administered 2017-08-30 (×2): 100 ug via INTRAVENOUS

## 2017-08-30 MED ORDER — BUPIVACAINE 0.5 % ON-Q PUMP SINGLE CATH 400 ML
400.0000 mL | INJECTION | Status: DC
  Filled 2017-08-30: qty 400

## 2017-08-30 MED ORDER — INSULIN ASPART 100 UNIT/ML ~~LOC~~ SOLN
0.0000 [IU] | SUBCUTANEOUS | Status: DC
  Administered 2017-08-30: 2 [IU] via SUBCUTANEOUS
  Administered 2017-08-30: 4 [IU] via SUBCUTANEOUS
  Administered 2017-08-31: 2 [IU] via SUBCUTANEOUS

## 2017-08-30 MED ORDER — DEXTROSE-NACL 5-0.45 % IV SOLN
INTRAVENOUS | Status: DC
  Administered 2017-08-30: 100 mL/h via INTRAVENOUS
  Administered 2017-08-30: via INTRAVENOUS

## 2017-08-30 SURGICAL SUPPLY — 74 items
ADH SKN CLS APL DERMABOND .7 (GAUZE/BANDAGES/DRESSINGS)
BAG DECANTER FOR FLEXI CONT (MISCELLANEOUS) IMPLANT
BLADE SURG 11 STRL SS (BLADE) ×4 IMPLANT
CANISTER SUCT 3000ML PPV (MISCELLANEOUS) ×4 IMPLANT
CATH KIT ON Q 5IN SLV (PAIN MANAGEMENT) IMPLANT
CATH KIT ON-Q SILVERSOAK 5IN (CATHETERS) ×4 IMPLANT
CATH ROBINSON RED A/P 22FR (CATHETERS) IMPLANT
CATH THORACIC 28FR (CATHETERS) IMPLANT
CATH THORACIC 36FR (CATHETERS) IMPLANT
CATH THORACIC 36FR RT ANG (CATHETERS) ×4 IMPLANT
CLIP VESOCCLUDE MED 6/CT (CLIP) ×4 IMPLANT
CONN ST 1/4X3/8  BEN (MISCELLANEOUS) ×2
CONN ST 1/4X3/8 BEN (MISCELLANEOUS) ×2 IMPLANT
CONT SPEC 4OZ CLIKSEAL STRL BL (MISCELLANEOUS) ×8 IMPLANT
DERMABOND ADVANCED (GAUZE/BANDAGES/DRESSINGS)
DERMABOND ADVANCED .7 DNX12 (GAUZE/BANDAGES/DRESSINGS) IMPLANT
DRAIN CHANNEL 28F RND 3/8 FF (WOUND CARE) ×4 IMPLANT
DRAPE LAPAROSCOPIC ABDOMINAL (DRAPES) ×4 IMPLANT
DRAPE WARM FLUID 44X44 (DRAPE) ×4 IMPLANT
ELECT BLADE 4.0 EZ CLEAN MEGAD (MISCELLANEOUS) ×4
ELECT BLADE 6.5 EXT (BLADE) ×4 IMPLANT
ELECT REM PT RETURN 9FT ADLT (ELECTROSURGICAL) ×4
ELECTRODE BLDE 4.0 EZ CLN MEGD (MISCELLANEOUS) ×2 IMPLANT
ELECTRODE REM PT RTRN 9FT ADLT (ELECTROSURGICAL) ×2 IMPLANT
GAUZE SPONGE 4X4 12PLY STRL (GAUZE/BANDAGES/DRESSINGS) ×8 IMPLANT
GAUZE SPONGE 4X4 12PLY STRL LF (GAUZE/BANDAGES/DRESSINGS) ×4 IMPLANT
GLOVE BIOGEL PI IND STRL 6.5 (GLOVE) ×6 IMPLANT
GLOVE BIOGEL PI INDICATOR 6.5 (GLOVE) ×6
GLOVE SURG SS PI 6.0 STRL IVOR (GLOVE) ×4 IMPLANT
GOWN STRL REUS W/ TWL LRG LVL3 (GOWN DISPOSABLE) ×6 IMPLANT
GOWN STRL REUS W/TWL LRG LVL3 (GOWN DISPOSABLE) ×9
KIT BASIN OR (CUSTOM PROCEDURE TRAY) ×4 IMPLANT
KIT SUCTION CATH 14FR (SUCTIONS) ×4 IMPLANT
KIT TURNOVER KIT B (KITS) ×4 IMPLANT
NS IRRIG 1000ML POUR BTL (IV SOLUTION) ×16 IMPLANT
PACK CHEST (CUSTOM PROCEDURE TRAY) ×4 IMPLANT
PAD ARMBOARD 7.5X6 YLW CONV (MISCELLANEOUS) ×8 IMPLANT
POWDER SURGICEL 3.0 GRAM (HEMOSTASIS) ×4 IMPLANT
SEALANT SURG COSEAL 4ML (VASCULAR PRODUCTS) ×4 IMPLANT
SOLUTION ANTI FOG 6CC (MISCELLANEOUS) ×4 IMPLANT
SPONGE INTESTINAL PEANUT (DISPOSABLE) ×20 IMPLANT
SPONGE TONSIL 1.25 RF SGL STRG (GAUZE/BANDAGES/DRESSINGS) ×4 IMPLANT
SUT CHROMIC 3 0 SH 27 (SUTURE) IMPLANT
SUT ETHILON 3 0 PS 1 (SUTURE) IMPLANT
SUT PROLENE 3 0 SH DA (SUTURE) IMPLANT
SUT PROLENE 4 0 RB 1 (SUTURE)
SUT PROLENE 4-0 RB1 .5 CRCL 36 (SUTURE) IMPLANT
SUT PROLENE 6 0 C 1 30 (SUTURE) IMPLANT
SUT SILK  1 MH (SUTURE) ×4
SUT SILK 1 MH (SUTURE) ×4 IMPLANT
SUT SILK 1 TIES 10X30 (SUTURE) IMPLANT
SUT SILK 2 0SH CR/8 30 (SUTURE) ×4 IMPLANT
SUT SILK 3 0SH CR/8 30 (SUTURE) IMPLANT
SUT VIC AB 1 CTX 18 (SUTURE) ×8 IMPLANT
SUT VIC AB 1 CTX 27 (SUTURE) ×4 IMPLANT
SUT VIC AB 2 TP1 27 (SUTURE) IMPLANT
SUT VIC AB 2-0 CT2 18 VCP726D (SUTURE) IMPLANT
SUT VIC AB 2-0 CTX 27 (SUTURE) ×4 IMPLANT
SUT VIC AB 2-0 CTX 36 (SUTURE) ×4 IMPLANT
SUT VIC AB 3-0 SH 18 (SUTURE) IMPLANT
SUT VIC AB 3-0 X1 27 (SUTURE) ×8 IMPLANT
SUT VICRYL 0 UR6 27IN ABS (SUTURE) IMPLANT
SUT VICRYL 2 TP 1 (SUTURE) ×4 IMPLANT
SWAB COLLECTION DEVICE MRSA (MISCELLANEOUS) ×4 IMPLANT
SWAB CULTURE ESWAB REG 1ML (MISCELLANEOUS) IMPLANT
SYSTEM SAHARA CHEST DRAIN ATS (WOUND CARE) ×4 IMPLANT
TAPE CLOTH SURG 4X10 WHT LF (GAUZE/BANDAGES/DRESSINGS) ×4 IMPLANT
TIP APPLICATOR SPRAY EXTEND 16 (VASCULAR PRODUCTS) IMPLANT
TOWEL GREEN STERILE (TOWEL DISPOSABLE) ×4 IMPLANT
TOWEL GREEN STERILE FF (TOWEL DISPOSABLE) ×4 IMPLANT
TRAP SPECIMEN MUCOUS 40CC (MISCELLANEOUS) ×4 IMPLANT
TRAY FOLEY W/METER SILVER 16FR (SET/KITS/TRAYS/PACK) ×4 IMPLANT
TUNNELER SHEATH ON-Q 11GX8 DSP (PAIN MANAGEMENT) ×4 IMPLANT
WATER STERILE IRR 1000ML POUR (IV SOLUTION) ×8 IMPLANT

## 2017-08-30 NOTE — Progress Notes (Signed)
PROGRESS NOTE    PHOENYX PAULSEN  QHU:[id number] DOB: 15-Oct-1950 DOA: 08/25/2017 PCP: Antonietta Jewel, MD   Brief Narrative: Alex [name] is a 67 y.o. male with a history of hypertension, CVA, carotid stenosis, aortic atherosclerosis. He presented with cough and right-sided chest pain found to have pneumonia with an associated empyema. He has improved on antibiotics and is awaiting VATS by CT surgery.   Assessment & Plan:   Principal Problem:   Empyema ([place]) Active Problems:   Pleural effusion   Anemia   Lobar pneumonia (HCC)   Pleural effusion, right   Pulmonary nodule, right   Benign essential HTN   Aortic atherosclerosis (HCC)   Right lower lobe pneumonia Loculated pleural effusion/empyema Improved on current antibiotic regimen. Afebrile. -Continue Vancomycin/Cefepime -CT surgery recommendations: VATS planned for today  Pulmonary nodule 2 mm, right upper lobe. Will need outpatient follow-up with repeat chest CT  Essential hypertension Well controlled. Hydrochlorothiazide discontinued in setting of controlled blood pressure and mild hyponatremia. -Continue to hold hydrochlorothiazide   History of CVA Chronic left carotid occlusion Aortic atherosclerosis Stable. -Continue aspirin and Lipitor -Continue to hold Plavix secondary to planned surgery  Hypokalemia Resolved with supplementation  Hyponatremia Thought secondary to hydrochlorothiazide. Stable.   DVT prophylaxis: Lovenox Code Status:   Code Status: Full Code Family Communication: None at bedside Disposition Plan: Discharge pending VATS and CT surgery recommendations   Consultants:   CT surgery  Pulmonology  Procedures:   None  Antimicrobials:  Vancomycin (3/30>>  Cefepime (3/30>>    Subjective: Some right sided lower chest pain, otherwise, no dyspnea or cough  Objective: Vitals:   08/28/17 2332 08/29/17 0725 08/30/17 0035 08/30/17 0720  BP: 116/78 111/76 116/76 (P) 113/73    Pulse: 72 72 75 (P) 69  Resp: 16 18 16  (P) 16  Temp: 98.1 F (36.7 C) 98.3 F (36.8 C) 97.7 F (36.5 C) (P) 98 F (36.7 C)  TempSrc: Oral Oral Oral (P) Oral  SpO2: 98% 97% 97% (P) 97%  Weight:      Height:        Intake/Output Summary (Last 24 hours) at 08/30/2017 0834 Last data filed at 08/30/2017 0313 Gross per 24 hour  Intake 840 ml  Output 200 ml  Net 640 ml   Filed Weights   08/26/17 0049  Weight: 78.5 kg (173 lb)    Examination:  General: Well appearing, no distress Respiratory system: Diminished on right mid/lower lung. Clear on left side. No respiratory distress. Cardiovascular system: Regular rate and rhythm. Normal S1 and S2. No heart murmurs present. No extra heart sounds Gastrointestinal system: Soft, non-tender, non-distended, no guarding, no rebound, no masses felt, normal bowel sounds Central nervous system: Alert and oriented. No focal neurological deficits. Extremities: No edema. No calf tenderness Skin: No cyanosis. No rashes Psychiatry: Judgement and insight appear normal. Mood & affect appropriate.     Data Reviewed: I have personally reviewed following labs and imaging studies  CBC: Recent Labs  Lab 08/25/17 2254 08/27/17 0708 08/29/17 0504 08/29/17 0956  WBC 10.5 8.9 10.6* 10.9*  NEUTROABS 7.8*  --   --   --   HGB 12.5* 11.7* 11.4* 11.7*  HCT 38.7* 37.1* 35.6* 36.9*  MCV 83.6 84.5 84.0 83.9  PLT 423* 387 400 465   Basic Metabolic Panel: Recent Labs  Lab 08/25/17 2254 08/27/17 0708 08/28/17 0848 08/29/17 0504 08/29/17 0956  NA 134* 138 134* 134* 136  K 3.5 3.9 3.4* 3.6 3.5  CL 98* 101 98*  95* 100*  CO2 23 27 27 26 28   GLUCOSE 108* 97 137* 102* 137*  BUN 6 6 9 9 8   CREATININE 0.58* 0.67 0.67 0.61 0.64  CALCIUM 8.9 8.8* 8.6* 8.6* 8.7*   GFR: Estimated Creatinine Clearance: 89.5 mL/min (by C-G formula based on SCr of 0.64 mg/dL). Liver Function Tests: Recent Labs  Lab 08/27/17 0708 08/29/17 0956  AST 18 20  ALT 14* 15*   ALKPHOS 55 56  BILITOT 0.7 0.7  PROT 6.9 6.6  ALBUMIN 2.7* 2.7*   No results for input(s): LIPASE, AMYLASE in the last 168 hours. No results for input(s): AMMONIA in the last 168 hours. Coagulation Profile: Recent Labs  Lab 08/27/17 0708 08/29/17 0956  INR 1.12 1.09   Cardiac Enzymes: Recent Labs  Lab 08/26/17 0230 08/26/17 0838 08/26/17 1245  TROPONINI <0.03 <0.03 <0.03   BNP (last 3 results) No results for input(s): PROBNP in the last 8760 hours. HbA1C: No results for input(s): HGBA1C in the last 72 hours. CBG: No results for input(s): GLUCAP in the last 168 hours. Lipid Profile: No results for input(s): CHOL, HDL, LDLCALC, TRIG, CHOLHDL, LDLDIRECT in the last 72 hours. Thyroid Function Tests: No results for input(s): TSH, T4TOTAL, FREET4, T3FREE, THYROIDAB in the last 72 hours. Anemia Panel: No results for input(s): VITAMINB12, FOLATE, FERRITIN, TIBC, IRON, RETICCTPCT in the last 72 hours. Sepsis Labs: No results for input(s): PROCALCITON, LATICACIDVEN in the last 168 hours.  Recent Results (from the past 240 hour(s))  Culture, blood (routine x 2) Call MD if unable to obtain prior to antibiotics being given     Status: None (Preliminary result)   Collection Time: 08/26/17  2:30 AM  Result Value Ref Range Status   Specimen Description BLOOD LEFT ARM  Final   Special Requests   Final    BOTTLES DRAWN AEROBIC AND ANAEROBIC Blood Culture adequate volume   Culture   Final    NO GROWTH 3 DAYS Performed at [facility], [address] [place], [place] [address]    Report Status PENDING  Incomplete  Culture, blood (routine x 2) Call MD if unable to obtain prior to antibiotics being given     Status: None (Preliminary result)   Collection Time: 08/26/17  2:40 AM  Result Value Ref Range Status   Specimen Description BLOOD RIGHT ARM  Final   Special Requests   Final    BOTTLES DRAWN AEROBIC ONLY Blood Culture adequate volume   Culture   Final    NO GROWTH 3  DAYS Performed at [facility], [place] [address] [place], [place] [address]    Report Status PENDING  Incomplete  Culture, sputum-assessment     Status: None   Collection Time: 08/26/17  4:00 AM  Result Value Ref Range Status   Specimen Description SPUTUM  Final   Special Requests Normal  Final   Sputum evaluation   Final    Sputum specimen not acceptable for testing.  Please recollect.   Gram Stain Report Called to,Read Back By and Verified With: C STAMPER RN AT 0801 ON [id number] BY SJW Performed at [facility], [place] [address] [place], [place] [address]    Report Status 08/26/2017 FINAL  Final  Surgical pcr screen     Status: None   Collection Time: 08/26/17  6:00 PM  Result Value Ref Range Status   MRSA, PCR NEGATIVE NEGATIVE Final   Staphylococcus aureus NEGATIVE NEGATIVE Final    Comment: (NOTE)  The Xpert SA Assay (FDA approved for NASAL specimens in patients 25 years of age and older), is one component of a comprehensive surveillance program. It is not intended to diagnose infection nor to guide or monitor treatment. Performed at [facility], [place] [address], [place], [place] [address]          Radiology Studies: No results found.      Scheduled Meds: . aspirin EC  81 mg Oral Daily  . enoxaparin (LOVENOX) injection  40 mg Subcutaneous Daily  . potassium chloride  40 mEq Oral Once  . simvastatin  20 mg Oral QHS   Continuous Infusions: .  ceFAZolin (ANCEF) IV    . ceFEPime (MAXIPIME) IV Stopped (08/30/17 0145)  . vancomycin Stopped (08/30/17 0313)     LOS: 5 days     Cordelia Poche, MD [facility] 08/30/2017, 8:34 AM Pager: [phone]  If 7PM-7AM, please contact night-coverage [email or web address] Password [facility] 08/30/2017, 8:34 AM

## 2017-08-30 NOTE — Anesthesia Postprocedure Evaluation (Signed)
Anesthesia Post Note  Patient: Alex [name]  Procedure(s) Performed: VIDEO ASSISTED THORACOSCOPY (VATS)/EMPYEMA    (Right Chest) DECORTICATION of right lower lung lobe (Right )     Patient location during evaluation: PACU Anesthesia Type: General Level of consciousness: awake and alert Pain management: pain level controlled Vital Signs Assessment: post-procedure vital signs reviewed and stable Respiratory status: spontaneous breathing, nonlabored ventilation, respiratory function stable and patient connected to nasal cannula oxygen Cardiovascular status: blood pressure returned to baseline and stable Postop Assessment: no apparent nausea or vomiting Anesthetic complications: no    Last Vitals:  Vitals:   08/30/17 1700 08/30/17 1704  BP: 110/61 107/65  Pulse: 66 71  Resp: 20 20  Temp:  36.7 C  SpO2: 97% 97%    Last Pain:  Vitals:   08/30/17 1704  TempSrc:   PainSc: Asleep                 Effie Berkshire

## 2017-08-30 NOTE — Progress Notes (Signed)
Patient to OR. Report called. Patient's belongings given to family.

## 2017-08-30 NOTE — Transfer of Care (Signed)
Immediate Anesthesia Transfer of Care Note  Patient: Alex [name]  Procedure(s) Performed: VIDEO ASSISTED THORACOSCOPY (VATS)/EMPYEMA    (Right Chest) DECORTICATION of right lower lung lobe (Right )  Patient Location: PACU  Anesthesia Type:General  Level of Consciousness: awake and alert   Airway & Oxygen Therapy: Patient Spontanous Breathing and Patient connected to nasal cannula oxygen  Post-op Assessment: Report given to RN and Post -op Vital signs reviewed and stable  Post vital signs: Reviewed and stable  Last Vitals:  Vitals Value Taken Time  BP 103/54 08/30/2017  4:15 PM  Temp    Pulse 69 08/30/2017  4:17 PM  Resp 23 08/30/2017  4:17 PM  SpO2 98 % 08/30/2017  4:17 PM  Vitals shown include unvalidated device data.  Last Pain:  Vitals:   08/30/17 0720  TempSrc: (P) Oral  PainSc:          Complications: No apparent anesthesia complications

## 2017-08-30 NOTE — Brief Op Note (Addendum)
08/25/2017 - 08/30/2017  4:00 PM  PATIENT:  Jimmylee L Sutherland  67 y.o. male  PRE-OPERATIVE DIAGNOSIS:  RIGHT EMPYEMA  POST-OPERATIVE DIAGNOSIS:  RIGHT EMPYEMA  PROCEDURE:  Procedure(s): VIDEO ASSISTED THORACOSCOPY (VATS)/EMPYEMA    (Right) DECORTICATION of right lower lung lobe (Right)  SURGEON:  Surgeon(s) and Role:    Ivin Poot, MD - Primary  PHYSICIAN ASSISTANT:  Nicholes Rough, PA-C  ANESTHESIA:   general  EBL:  30 mL   BLOOD ADMINISTERED:none  DRAINS: ONE RIGHT ANGLE CHEST TUBE AND ONE BLAKE CHEST TUBE   LOCAL MEDICATIONS USED:  BUPIVICAINE   SPECIMEN:  Source of Specimen:  PLEURAL PEEL, PLEURAL FLUID  DISPOSITION OF SPECIMEN:  PATHOLOGY  COUNTS:  YES  TOURNIQUET:  * No tourniquets in log *  DICTATION: .Dragon Dictation  PLAN OF CARE: Admit to inpatient   PATIENT DISPOSITION:  ICU - extubated and stable.   Delay start of Pharmacological VTE agent (>24hrs) due to surgical blood loss or risk of bleeding: yes

## 2017-08-30 NOTE — Anesthesia Procedure Notes (Signed)
Procedure Name: Intubation Date/Time: 08/30/2017 2:01 PM Performed by: Colin [name], CRNA Pre-anesthesia Checklist: Patient identified, Emergency Drugs available, Suction available and Patient being monitored Patient Re-evaluated:Patient Re-evaluated prior to induction Oxygen Delivery Method: Circle System Utilized Preoxygenation: Pre-oxygenation with 100% oxygen Induction Type: IV induction Ventilation: Mask ventilation without difficulty Laryngoscope Size: Mac and 3 Grade View: Grade I Tube type: Oral Endobronchial tube: Double lumen EBT and EBT position confirmed by auscultation and 39 Fr Number of attempts: 1 Airway Equipment and Method: Stylet and Oral airway Placement Confirmation: ETT inserted through vocal cords under direct vision,  positive ETCO2 and breath sounds checked- equal and bilateral Tube secured with: Tape Dental Injury: Teeth and Oropharynx as per pre-operative assessment

## 2017-08-30 NOTE — Anesthesia Procedure Notes (Signed)
Arterial Line Insertion Start/End4/08/2017 1:20 PM, 08/30/2017 1:25 PM Performed by: Colin [name], CRNA, CRNA  Patient location: Pre-op. Preanesthetic checklist: patient identified, IV checked, site marked, risks and benefits discussed, surgical consent, monitors and equipment checked, pre-op evaluation, timeout performed and anesthesia consent Lidocaine 1% used for infiltration Left, radial was placed Catheter size: 20 G Hand hygiene performed , maximum sterile barriers used  and Seldinger technique used Allen's test indicative of satisfactory collateral circulation Attempts: 1 Procedure performed without using ultrasound guided technique. Following insertion, dressing applied and Biopatch. Post procedure assessment: normal and unchanged  Patient tolerated the procedure well with no immediate complications.

## 2017-08-30 NOTE — Progress Notes (Signed)
[facility] BRIEF SICU PROGRESS NOTE  Day of Surgery  S/P Procedure(s) (LRB): VIDEO ASSISTED THORACOSCOPY (VATS)/EMPYEMA    (Right) DECORTICATION of right lower lung lobe (Right)   Awake and alert, adequate pain control Breathing comfortably w/ O2 sats 100% on 2 L/min Chest tube output low  Plan: Continue routine early postop  Rexene Alberts, MD 08/30/2017 7:47 PM

## 2017-08-30 NOTE — Anesthesia Preprocedure Evaluation (Addendum)
Anesthesia Evaluation  Patient identified by MRN, date of birth, ID band Patient awake    Reviewed: Allergy & Precautions, NPO status , Patient's Chart, lab work & pertinent test results  Airway Mallampati: II  TM Distance: >3 FB Neck ROM: Full    Dental  (+) Poor Dentition, Dental Advisory Given,    Pulmonary     + decreased breath sounds      Cardiovascular hypertension, Pt. on medications + Peripheral Vascular Disease   Rhythm:Regular Rate:Normal     Neuro/Psych CVA negative psych ROS   GI/Hepatic negative GI ROS, Neg liver ROS,   Endo/Other  negative endocrine ROS  Renal/GU negative Renal ROS     Musculoskeletal negative musculoskeletal ROS (+)   Abdominal   Peds  Hematology   Anesthesia Other Findings   Reproductive/Obstetrics                            Lab Results  Component Value Date   WBC 10.9 (H) 08/29/2017   HGB 11.7 (L) 08/29/2017   HCT 36.9 (L) 08/29/2017   MCV 83.9 08/29/2017   PLT 387 08/29/2017   Lab Results  Component Value Date   CREATININE 0.64 08/29/2017   BUN 8 08/29/2017   NA 136 08/29/2017   K 3.5 08/29/2017   CL 100 (L) 08/29/2017   CO2 28 08/29/2017   Lab Results  Component Value Date   INR 1.09 08/29/2017   INR 1.12 08/27/2017   INR 1.02 02/15/2011   EKG: normal sinus rhythm.  Echo: - Left ventricle: The cavity size was normal. Wall thickness was   normal. Systolic function was vigorous. The estimated ejection   fraction was in the range of 65% to 70%. Wall motion was normal;   there were no regional wall motion abnormalities. Features are   consistent with a pseudonormal left ventricular filling pattern,   with concomitant abnormal relaxation and increased filling   pressure (grade 2 diastolic dysfunction). - Aortic valve: There was mild regurgitation.   Anesthesia Physical Anesthesia Plan  ASA: II  Anesthesia Plan: General    Post-op Pain Management:    Induction: Intravenous  PONV Risk Score and Plan: 3 and Ondansetron, Dexamethasone and Midazolam  Airway Management Planned: Double Lumen EBT  Additional Equipment: Arterial line  Intra-op Plan:   Post-operative Plan: Extubation in OR  Informed Consent: I have reviewed the patients History and Physical, chart, labs and discussed the procedure including the risks, benefits and alternatives for the proposed anesthesia with the patient or authorized representative who has indicated his/her understanding and acceptance.   Dental advisory given  Plan Discussed with: CRNA  Anesthesia Plan Comments:       Anesthesia Quick Evaluation

## 2017-08-31 ENCOUNTER — Inpatient Hospital Stay (HOSPITAL_COMMUNITY): Payer: Self-pay

## 2017-08-31 ENCOUNTER — Encounter (HOSPITAL_COMMUNITY): Payer: Self-pay | Admitting: Cardiothoracic Surgery

## 2017-08-31 LAB — GLUCOSE, CAPILLARY
GLUCOSE-CAPILLARY: 136 mg/dL — AB (ref 65–99)
GLUCOSE-CAPILLARY: 152 mg/dL — AB (ref 65–99)
Glucose-Capillary: 171 mg/dL — ABNORMAL HIGH (ref 65–99)
Glucose-Capillary: 117 mg/dL — ABNORMAL HIGH (ref 65–99)

## 2017-08-31 LAB — POCT I-STAT 3, ART BLOOD GAS (G3+)
ACID-BASE EXCESS: 2 mmol/L (ref 0.0–2.0)
BICARBONATE: 27.6 mmol/L (ref 20.0–28.0)
O2 Saturation: 98 %
TCO2: 29 mmol/L (ref 22–32)
pCO2 arterial: 44.7 mmHg (ref 32.0–48.0)
pH, Arterial: 7.396 (ref 7.350–7.450)
pO2, Arterial: 114 mmHg — ABNORMAL HIGH (ref 83.0–108.0)

## 2017-08-31 LAB — CULTURE, BLOOD (ROUTINE X 2)
CULTURE: NO GROWTH
Culture: NO GROWTH
SPECIAL REQUESTS: ADEQUATE
SPECIAL REQUESTS: ADEQUATE

## 2017-08-31 LAB — BASIC METABOLIC PANEL
ANION GAP: 8 (ref 5–15)
BUN: 7 mg/dL (ref 6–20)
CHLORIDE: 100 mmol/L — AB (ref 101–111)
CO2: 25 mmol/L (ref 22–32)
Calcium: 8.4 mg/dL — ABNORMAL LOW (ref 8.9–10.3)
Creatinine, Ser: 0.48 mg/dL — ABNORMAL LOW (ref 0.61–1.24)
GFR calc Af Amer: >60 mL/min (ref >60)
GFR calc non Af Amer: >60 mL/min (ref >60)
GLUCOSE: 116 mg/dL — AB (ref 65–99)
POTASSIUM: 4.1 mmol/L (ref 3.5–5.1)
Sodium: 133 mmol/L — ABNORMAL LOW (ref 135–145)

## 2017-08-31 LAB — CBC
HCT: 33.5 % — ABNORMAL LOW (ref 39.0–52.0)
HEMOGLOBIN: 10.5 g/dL — AB (ref 13.0–17.0)
MCH: 26.3 pg (ref 26.0–34.0)
MCHC: 31.3 g/dL (ref 30.0–36.0)
MCV: 84 fL (ref 78.0–100.0)
Platelets: 353 10*3/uL (ref 150–400)
RBC: 3.99 MIL/uL — ABNORMAL LOW (ref 4.22–5.81)
RDW: 13.8 % (ref 11.5–15.5)
WBC: 15.3 10*3/uL — AB (ref 4.0–10.5)

## 2017-08-31 LAB — ACID FAST SMEAR (AFB, MYCOBACTERIA): Acid Fast Smear: NEGATIVE

## 2017-08-31 MED ORDER — CLOPIDOGREL BISULFATE 75 MG PO TABS
75.0000 mg | ORAL_TABLET | Freq: Every day | ORAL | Status: DC
  Administered 2017-09-02 – 2017-09-04 (×3): 75 mg via ORAL
  Filled 2017-08-31 (×3): qty 1

## 2017-08-31 MED ORDER — PIPERACILLIN-TAZOBACTAM 3.375 G IVPB
3.3750 g | Freq: Three times a day (TID) | INTRAVENOUS | Status: DC
  Administered 2017-08-31 – 2017-09-03 (×10): 3.375 g via INTRAVENOUS
  Filled 2017-08-31 (×13): qty 50

## 2017-08-31 MED ORDER — BOOST PO LIQD
237.0000 mL | Freq: Three times a day (TID) | ORAL | Status: DC
  Administered 2017-08-31 – 2017-09-03 (×9): 237 mL via ORAL
  Filled 2017-08-31 (×17): qty 237

## 2017-08-31 NOTE — Op Note (Signed)
NAME:  Alex [name], Alex [name]                   ACCOUNT NO.:  MEDICAL RECORD NO.:  [id number]  LOCATION:                                 FACILITY:  PHYSICIAN:  Ivin Poot, M.D.  DATE OF BIRTH:  June 26, 1950  DATE OF PROCEDURE:  08/30/2017 DATE OF DISCHARGE:                              OPERATIVE REPORT   OPERATIONS: 1. Right VATS (video-assisted thoracoscopic surgery) for drainage of     right empyema. 2. Decortication of right lower lobe.  PREOPERATIVE DIAGNOSIS:  Right empyema.  POSTOPERATIVE DIAGNOSIS:  Right empyema.  SURGEON:  Ivin Poot, M.D.  ASSISTANT:  Nicholes Rough, [facility].  ANESTHESIA:  General.  CLINICAL NOTE:  The patient is a 67 year old with previous history of right lower lobe pneumonia for which he was admitted to the hospital and treated with IV antibiotics which continued with oral antibiotics at home.  He returned with shortness of breath and right pleuritic chest pain, malaise and was found to have a large right pleural effusion.  CT scan showed this to be loculated and consistent with empyema.  Thoracic surgical evaluation was requested.  I reviewed the patient's CT scan and examined the patient.  I agreed with the recommendation for drainage of the empyema with surgery and decortication.  I did not feel that a tube would adequately treat this well-established empyema.  I discussed the procedure of right VATS for drainage of the empyema with the patient including the use of general anesthesia, the location of the surgical incisions, and the expected postoperative recovery, which included chest tube drainage.  He understood the risks of bleeding, recurrent infection and agreed to proceed with surgery under informed consent.  OPERATIVE FINDINGS: 1. There was gross pus present within the cavity.  This was sent for     routine aerobic, anaerobic, and AFB and fungal cultures. 2. A thick peel on the right lower lobe was dissected and removed to     allow  re-expansion of the lung. 3. Minimal blood loss.  No transfusion requirements.  DESCRIPTION OF PROCEDURE:  The patient was brought to the operating room and placed supine on the operating table.  General anesthesia was induced under invasive hemodynamic monitoring.  The correct site had been previously marked.  The patient was intubated with a double-lumen tube by the Anesthesia team.  The patient was turned right side up.  The right chest was prepped and draped as a sterile field.  A proper time- out was performed.  A small incision was made in the fifth interspace from the tip of the scapula anteriorly.  The pleural space was entered. It was filled with dense adhesions and we broke into a pocket that had pure pus.  This was cultured.  The incision was extended since the scope would not help visualize this pathology.  Slowly, the space was dissected open and completely cleared of purulent fluid and debris.  It was irrigated with copious amounts of warm saline. It was bordered by the chest wall, the diaphragm, and the right lower lobe.  On the surface of the right lower lobe, there was a thick leather- like peel.  An incision in this  peel was then made and it was dissected off the surface of the lung and removed and sent for pathology.  The surface of the lung was covered with some Surgicel and CoSeal medical adhesive.  This cavity was then drained with 2 chest tubes inferiorly and superiorly and brought out through separate incisions.  The incision was then closed with pericostal sutures of #2 Vicryl. Before these were tied, the lungs were expanded.  This filled the space.  The muscle layers were closed with interrupted Vicryl.  The subcutaneous and skin layers were closed with running Vicryl.  An On-Q catheter was placed between the incision and the chest tubes were tunneled in the chest wall, secured to the skin and connected to a reservoir of 0.5% Marcaine.  The patient was  then turned supine, extubated, returned to the recovery room in stable condition.     Ivin Poot, M.D.     PV/MEDQ  D:  08/30/2017  T:  08/31/2017  Job:  [id number]  cc:   Brand Males, MD

## 2017-08-31 NOTE — Progress Notes (Signed)
EVENING ROUNDS NOTE :     [address]       ,[place] [address]             [phone]                 1 Day Post-Op Procedure(s) (LRB): VIDEO ASSISTED THORACOSCOPY (VATS)/EMPYEMA    (Right) DECORTICATION of right lower lung lobe (Right)   Patient awake. He states pain is fairly well controlled.  Total Length of Stay:  LOS: 6 days  BP 99/82   Pulse 78   Temp 98.5 F (36.9 C) (Oral)   Resp 18   Ht 5\' 6"  (1.676 m)   Wt 173 lb (78.5 kg)   SpO2 97%   BMI 27.92 kg/m   .Intake/Output      04/05 0701 - 04/06 0700   P.O. 360   I.V. (mL/kg) 393.6 (5)   IV Piggyback 300   Total Intake(mL/kg) 1053.6 (13.4)   Urine (mL/kg/hr) 1150 (1)   Blood    Chest Tube 50-no air leak  Total Output 1200   Net -146.4         . bupivacaine 0.5 % ON-Q pump SINGLE CATH 400 mL    . dextrose 5 % and 0.45% NaCl 10 mL/hr at 08/31/17 0900  . piperacillin-tazobactam (ZOSYN)  IV 3.375 g (08/31/17 2151)  . potassium chloride    . vancomycin Stopped (08/31/17 1430)     Lab Results  Component Value Date   WBC 15.3 (H) 08/31/2017   HGB 10.5 (L) 08/31/2017   HCT 33.5 (L) 08/31/2017   PLT 353 08/31/2017   GLUCOSE 116 (H) 08/31/2017   CHOL 215 (H) 02/15/2011   TRIG 165 (H) 02/15/2011   HDL 35 (L) 02/15/2011   LDLCALC 147 (H) 02/15/2011   ALT 15 (L) 08/29/2017   AST 20 08/29/2017   NA 133 (L) 08/31/2017   K 4.1 08/31/2017   CL 100 (L) 08/31/2017   CREATININE 0.48 (L) 08/31/2017   BUN 7 08/31/2017   CO2 25 08/31/2017   TSH 2.178 08/27/2017   INR 1.09 08/29/2017   HGBA1C 5.7 (H) 08/27/2017  Continue present management   Lars Pinks PA-C 08/31/2017 10:07 PM

## 2017-08-31 NOTE — Progress Notes (Signed)
1 Day Post-Op Procedure(s) (LRB): VIDEO ASSISTED THORACOSCOPY (VATS)/EMPYEMA    (Right) DECORTICATION of right lower lung lobe (Right) Subjective: VATS for RLL empyema Gram pos cocci from OR smear Pain ok pt on plavix preop for occluded carotid- will restart Sunday ( ordered) No air leak Objective: Vital signs in last 24 hours: Temp:  [97.7 F (36.5 C)-98 F (36.7 C)] 98 F (36.7 C) (04/05 0826) Pulse Rate:  [53-73] 58 (04/05 0600) Cardiac Rhythm: Normal sinus rhythm (04/05 0400) Resp:  [8-26] 15 (04/05 0600) BP: (95-135)/(52-84) 108/68 (04/05 0600) SpO2:  [96 %-100 %] 100 % (04/05 0600) Arterial Line BP: (103-158)/(45-65) 114/53 (04/05 0600)  Hemodynamic parameters for last 24 hours:    Intake/Output from previous day: 04/04 0701 - 04/05 0700 In: 3971.7 [P.O.:360; I.V.:2911.7; IV Piggyback:700] Out: 1017 [Urine:1425; Blood:30; Chest Tube:260] Intake/Output this shift: No intake/output data recorded.       Exam    General- alert and comfortable    Neck- no JVD, no cervical adenopathy palpable, no carotid bruit   Lungs- clear without rales, wheezes   Cor- regular rate and rhythm, no murmur , gallop   Abdomen- soft, non-tender   Extremities - warm, non-tender, minimal edema   Neuro- oriented, appropriate, no focal weakness    Lab Results: Recent Labs    08/29/17 0956 08/31/17 0345  WBC 10.9* 15.3*  HGB 11.7* 10.5*  HCT 36.9* 33.5*  PLT 387 353   BMET:  Recent Labs    08/29/17 0956 08/31/17 0345  NA 136 133*  K 3.5 4.1  CL 100* 100*  CO2 28 25  GLUCOSE 137* 116*  BUN 8 7  CREATININE 0.64 0.48*  CALCIUM 8.7* 8.4*    PT/INR:  Recent Labs    08/29/17 0956  LABPROT 14.0  INR 1.09   ABG    Component Value Date/Time   PHART 7.396 08/31/2017 0702   HCO3 27.6 08/31/2017 0702   TCO2 29 08/31/2017 0702   O2SAT 98.0 08/31/2017 0702   CBG (last 3)  Recent Labs    08/30/17 1843 08/30/17 2118 08/30/17 2343  GLUCAP 171* 152* 136*     Assessment/Plan: S/P Procedure(s) (LRB): VIDEO ASSISTED THORACOSCOPY (VATS)/EMPYEMA    (Right) DECORTICATION of right lower lung lobe (Right) Mobilize See progression orders leave bth tubes today  DC Bard drain  first in 1-2 days VANC and Zosyn  LOS: 6 days    Tharon Aquas Trigt III 08/31/2017

## 2017-08-31 NOTE — Discharge Instructions (Signed)
Thoracotomy, Care After This sheet gives you information about how to care for yourself after your procedure. Your health care provider may also give you more specific instructions. If you have problems or questions, contact your health care provider. What can I expect after the procedure? After your procedure, it is common to have:  Pain and swelling around the incision area.  Pain when you breathe in (inhale).  Constipation.  Fatigue.  Loss of appetite.  Trouble sleeping.  Mood swings and depression.  Follow these instructions at home: Preventing pneumonia  Take deep breaths or do breathing exercises as instructed by your health care provider.  Cough frequently. Coughing may cause discomfort, but it is important to clear mucus (phlegm) and expand your lungs. If coughing hurts, hold a pillow against your chest or place both hands flat on top of the incision (splinting) when you cough. This may help relieve discomfort.  Continue to use an incentive spirometer as directed. This is a tool that measures how well you fill your lungs with each breath.  Participate in pulmonary rehabilitation as directed. This is a program that combines education, exercise, and support from a team of specialists. The goal is to help you heal and return to normal activities as soon as possible. Medicines  Take over-the-counter or prescription medicines only as told by your health care provider.  If you have pain, take pain-relieving medicine before your pain becomes severe. This is important because if your pain is under control, you will be able to breathe and cough more comfortably.  If you were prescribed an antibiotic medicine, take it as told by your health care provider. Do not stop taking the antibiotic even if you start to feel better. Activity  Ask your health care provider what activities are safe for you.  Do not travel by airplane for 2 weeks after your chest tube is removed, or until  your health care provider says that this is safe.  Do not lift anything that is heavier than 10 lb (4.5 kg), or the limit that your health care provider tells you, until he or she says that it is safe.  Do not drive until your health care provider approves. ? Do not drive or use heavy machinery while taking prescription pain medicine. Incision care  Follow instructions from your health care provider about how to take care of your incision. Make sure you: ? Wash your hands with soap and water before you change your bandage (dressing). If soap and water are not available, use hand sanitizer. ? Change your dressing as told by your health care provider. ? Leave stitches (sutures), skin glue, or adhesive strips in place. These skin closures may need to stay in place for 2 weeks or longer. If adhesive strip edges start to loosen and curl up, you may trim the loose edges. Do not remove adhesive strips completely unless your health care provider tells you to do that.  Keep your dressing dry.  Check your incision area every day for signs of infection. Check for: ? More redness, swelling, or pain. ? More fluid or blood. ? Warmth. ? Pus or a bad smell. Bathing  Do not take baths, swim, or use a hot tub until your health care provider approves. You may take showers.  After your dressing has been removed, use soap and water to gently wash your incision area. Do not use anything else to clean your incision unless your health care provider tells you to do that. Eating and  drinking  Eat a healthy diet as instructed by your health care provider. A healthy diet includes plenty of fresh fruits and vegetables, whole grains, and low-fat (lean) proteins.  Drink enough fluid to keep your urine clear or pale yellow. General instructions  To prevent or treat constipation while you are taking prescription pain medicine, your health care provider may recommend that you: ? Take over-the-counter or prescription  medicines. ? Eat foods that are high in fiber, such as fresh fruits and vegetables, whole grains, and beans. ? Limit foods that are high in fat and processed sugars, such as fried and sweet foods.  Do not use any products that contain nicotine or tobacco, such as cigarettes and e-cigarettes. If you need help quitting, ask your health care provider.  Avoid secondhand smoke.  Wear compression stockings as told by your health care provider. These stockings help to prevent blood clots and reduce swelling in your legs.  If you have a chest tube, care for it as instructed.  Keep all follow-up visits as told by your health care provider. This is important. Contact a health care provider if:  You have more redness, swelling, or pain around your incision.  You have more fluid or blood coming from your incision.  Your incision feels warm to the touch.  You have pus or a bad smell coming from your incision.  You have a fever or chills.  Your heartbeat seems irregular.  You have nausea or vomiting.  You have muscle aches.  You are constipated. This may mean that you have: ? Fewer bowel movements in a week than normal. ? Difficulty having a bowel movement. ? Stools that are dry, hard, or larger than normal. Get help right away if:  You develop a rash.  You feel light-headed or feel like you are going to faint.  You have shortness of breath or trouble breathing.  You are confused.  You have trouble speaking.  You have vision problems.  You are not able to move.  You have numbness in your face, arms, or legs.  You lose consciousness.  You have a sudden, severe headache.  You feel weak.  You have chest pain.  You have pain that: ? Is severe. ? Gets worse, even with medicine. Summary  To prevent pneumonia, take deep breaths, do breathing exercises, and cough frequently, as instructed by your health care provider.  Do not drive until your health care provider  approves. Do not travel by airplane for 2 weeks after your chest tube is removed, or until your health care provider approves.  Check your incision area every day for signs of infection.  Eat a healthy diet that includes plenty of fresh fruits and vegetables, whole grains, and low-fat (lean) proteins. This information is not intended to replace advice given to you by your health care provider. Make sure you discuss any questions you have with your health care provider. Document Released: 10/28/2010 Document Revised: 02/07/2016 Document Reviewed: 02/07/2016 Elsevier Interactive Patient Education  2017 [employer].

## 2017-08-31 NOTE — Progress Notes (Signed)
Pharmacy Antibiotic Note  Alex [name] is a 67 y.o. male admitted on 08/25/2017 with pneumonia.  Pharmacy has been consulted for Vancomycin dosing.   Day #7 of abx for PNA/empyema. Recent PNA hx, WBC up to 15 this morning, afebrile overnight.   CT - R-pleural effusion, suspicious for empyema. S/p VATs 4/4.  Preliminary pleural fluid cultures show GPC  Plan: Continue vancomycin 1000 mg IV q12h, will check level today Cefepime changed to zosyn per MD  Temp (24hrs), Avg:97.9 F (36.6 C), Min:97.7 F (36.5 C), Max:98 F (36.7 C)  Recent Labs  Lab 08/25/17 2254 08/27/17 0708 08/28/17 0848 08/29/17 0504 08/29/17 0956 08/31/17 0345  WBC 10.5 8.9  --  10.6* 10.9* 15.3*  CREATININE 0.58* 0.67 0.67 0.61 0.64 0.48*    Estimated Creatinine Clearance: 89.5 mL/min (A) (by C-G formula based on SCr of 0.48 mg/dL (L)).    No Known Allergies   Cefepime 3/31 >4/5 Vanc 3/31 > Zosyn 4/5>>  Pleural fluid 4/4: GPC BCx 3/31: ngtd Sputum 3/31: neg Flu A/B neg MRSA PCR negative   Erin Hearing PharmD., BCPS Clinical Pharmacist 08/31/2017 8:38 AM

## 2017-09-01 ENCOUNTER — Inpatient Hospital Stay (HOSPITAL_COMMUNITY): Payer: Self-pay

## 2017-09-01 LAB — CBC
HEMATOCRIT: 33 % — AB (ref 39.0–52.0)
Hemoglobin: 10.1 g/dL — ABNORMAL LOW (ref 13.0–17.0)
MCH: 26.4 pg (ref 26.0–34.0)
MCHC: 30.6 g/dL (ref 30.0–36.0)
MCV: 86.2 fL (ref 78.0–100.0)
PLATELETS: 347 10*3/uL (ref 150–400)
RBC: 3.83 MIL/uL — ABNORMAL LOW (ref 4.22–5.81)
RDW: 14.4 % (ref 11.5–15.5)
WBC: 10.8 10*3/uL — AB (ref 4.0–10.5)

## 2017-09-01 LAB — COMPREHENSIVE METABOLIC PANEL
ALT: 12 U/L — ABNORMAL LOW (ref 17–63)
ANION GAP: 9 (ref 5–15)
AST: 20 U/L (ref 15–41)
Albumin: 2.4 g/dL — ABNORMAL LOW (ref 3.5–5.0)
Alkaline Phosphatase: 51 U/L (ref 38–126)
BUN: 8 mg/dL (ref 6–20)
CHLORIDE: 99 mmol/L — AB (ref 101–111)
CO2: 28 mmol/L (ref 22–32)
Calcium: 8.4 mg/dL — ABNORMAL LOW (ref 8.9–10.3)
Creatinine, Ser: 0.77 mg/dL (ref 0.61–1.24)
Glucose, Bld: 140 mg/dL — ABNORMAL HIGH (ref 65–99)
POTASSIUM: 4 mmol/L (ref 3.5–5.1)
Sodium: 136 mmol/L (ref 135–145)
Total Bilirubin: 0.8 mg/dL (ref 0.3–1.2)
Total Protein: 5.7 g/dL — ABNORMAL LOW (ref 6.5–8.1)

## 2017-09-01 LAB — VANCOMYCIN, TROUGH: Vancomycin Tr: 11 ug/mL — ABNORMAL LOW (ref 15–20)

## 2017-09-01 MED ORDER — SODIUM CHLORIDE 0.9% FLUSH
3.0000 mL | Freq: Two times a day (BID) | INTRAVENOUS | Status: DC
  Administered 2017-09-01 – 2017-09-03 (×4): 3 mL via INTRAVENOUS

## 2017-09-01 MED ORDER — SODIUM CHLORIDE 0.9% FLUSH: 3.0000 mL | INTRAVENOUS | Status: DC | PRN

## 2017-09-01 MED ORDER — PHENOL 1.4 % MT LIQD
1.0000 | OROMUCOSAL | Status: DC | PRN
  Filled 2017-09-01: qty 177

## 2017-09-01 MED ORDER — VANCOMYCIN HCL IN DEXTROSE 750-5 MG/150ML-% IV SOLN
750.0000 mg | Freq: Three times a day (TID) | INTRAVENOUS | Status: DC
  Administered 2017-09-01 – 2017-09-03 (×5): 750 mg via INTRAVENOUS
  Filled 2017-09-01 (×9): qty 150

## 2017-09-01 NOTE — Progress Notes (Signed)
Pharmacy Antibiotic Note  Alex [name] is a 67 y.o. male admitted on 08/25/2017 with pneumonia/empyema, s/p VATS on 4/4. Preliminary pleural fluid cultures show GPC. Pharmacy has been consulted for Vancomycin dosing. Day #8 of abx. Recent PNA hx, WBC down 10.8, currently AF. Vancomycin trough returned below goal at 11 this afternoon. Scr stable over admission, 0.77 today. Estimated CrCl ~90 mL/min.  Plan: Adjust to vancomycin 750 mg IV q8h Continue Zosyn 3.375g IV q8h per MD F/u C&S, de-escalation, LOT, vancomycin levels as indicated  Temp (24hrs), Avg:98.1 F (36.7 C), Min:97.8 F (36.6 C), Max:98.5 F (36.9 C)  Recent Labs  Lab 08/27/17 0708 08/28/17 0848 08/29/17 0504 08/29/17 0956 08/31/17 0345 09/01/17 0332 09/01/17 1317  WBC 8.9  --  10.6* 10.9* 15.3* 10.8*  --   CREATININE 0.67 0.67 0.61 0.64 0.48* 0.77  --   VANCOTROUGH  --   --   --   --   --   --  11*    Estimated Creatinine Clearance: 89.5 mL/min (by C-G formula based on SCr of 0.77 mg/dL).    No Known Allergies   Antibiotics this admission Cefepime 3/31 >> 4/5 Vanc 3/31 >> Zosyn 4/5 >>  Microbiology Pleural fluid 4/4: GPC BCx 3/31: ngtd Sputum 3/31: neg Flu A/B neg MRSA PCR negative   Jadrian Bulman N. Gerarda Fraction, PharmD PGY1 Pharmacy Resident Pager: [phone] 09/01/2017 2:48 PM

## 2017-09-01 NOTE — Progress Notes (Signed)
2 Days Post-Op Procedure(s) (LRB): VIDEO ASSISTED THORACOSCOPY (VATS)/EMPYEMA    (Right) DECORTICATION of right lower lung lobe (Right) Subjective: Some pain from tubes  Objective: Vital signs in last 24 hours: Temp:  [97.8 F (36.6 C)-98.5 F (36.9 C)] 97.9 F (36.6 C) (04/06 0825) Pulse Rate:  [53-78] 55 (04/06 0700) Cardiac Rhythm: Normal sinus rhythm (04/05 1600) Resp:  [5-23] 23 (04/06 0700) BP: (90-128)/(64-82) 128/72 (04/06 0700) SpO2:  [7 %-100 %] 98 % (04/06 0700) Arterial Line BP: (105-106)/(48-49) 105/48 (04/05 1100)  Hemodynamic parameters for last 24 hours:    Intake/Output from previous day: 04/05 0701 - 04/06 0700 In: 1053.6 [P.O.:360; I.V.:393.6; IV Piggyback:300] Out: 2130 [Urine:2050; Chest Tube:80] Intake/Output this shift: No intake/output data recorded.  General appearance: alert, cooperative and no distress Neurologic: intact Heart: regular rate and rhythm Lungs: diminished breath sounds right base Abdomen: normal findings: soft, non-tender no air leak, minimal drainage from CT  Lab Results: Recent Labs    08/31/17 0345 09/01/17 0332  WBC 15.3* 10.8*  HGB 10.5* 10.1*  HCT 33.5* 33.0*  PLT 353 347   BMET:  Recent Labs    08/31/17 0345 09/01/17 0332  NA 133* 136  K 4.1 4.0  CL 100* 99*  CO2 25 28  GLUCOSE 116* 140*  BUN 7 8  CREATININE 0.48* 0.77  CALCIUM 8.4* 8.4*    PT/INR:  Recent Labs    08/29/17 0956  LABPROT 14.0  INR 1.09   ABG    Component Value Date/Time   PHART 7.396 08/31/2017 0702   HCO3 27.6 08/31/2017 0702   TCO2 29 08/31/2017 0702   O2SAT 98.0 08/31/2017 0702   CBG (last 3)  Recent Labs    08/30/17 2118 08/30/17 2343 08/31/17 0347  GLUCAP 152* 136* 117*    Assessment/Plan: S/P Procedure(s) (LRB): VIDEO ASSISTED THORACOSCOPY (VATS)/EMPYEMA    (Right) DECORTICATION of right lower lung lobe (Right) -POD # 2 decortication Minimal drainage from CT- no air leak- will dc Bard drain and keep right  angle tube to water seal Still has some residual effusion/ consolidation right base Pain well controlled with PCA Ambulate SCD + enoxaparin for DVT prophylaxis  Dc Foley Transfer to stepdown when bed available   LOS: 7 days    Alex [name] 09/01/2017

## 2017-09-01 NOTE — Progress Notes (Signed)
      [address]       [place], [address]             [phone]      No complaints BP 108/75   Pulse 71   Temp 98.3 F (36.8 C) (Oral)   Resp 20   Ht 5\' 6"  (1.676 m)   Wt 173 lb (78.5 kg)   SpO2 97%   BMI 27.92 kg/m   Intake/Output Summary (Last 24 hours) at 09/01/2017 1901 Last data filed at 09/01/2017 1700 Gross per 24 hour  Intake 100 ml  Output 1680 ml  Net -1580 ml   Awaiting stepdown bed  Remo Lipps C. Roxan Hockey, MD [facility] [phone]

## 2017-09-01 NOTE — Plan of Care (Signed)
  Problem: Education: Goal: Knowledge of General Education information will improve Outcome: Progressing Note:  POC reviewed with pt.   

## 2017-09-02 ENCOUNTER — Inpatient Hospital Stay (HOSPITAL_COMMUNITY): Payer: Self-pay

## 2017-09-02 LAB — CBC
HEMATOCRIT: 34.8 % — AB (ref 39.0–52.0)
Hemoglobin: 10.8 g/dL — ABNORMAL LOW (ref 13.0–17.0)
MCH: 26.4 pg (ref 26.0–34.0)
MCHC: 31 g/dL (ref 30.0–36.0)
MCV: 85.1 fL (ref 78.0–100.0)
PLATELETS: 420 10*3/uL — AB (ref 150–400)
RBC: 4.09 MIL/uL — ABNORMAL LOW (ref 4.22–5.81)
RDW: 14.2 % (ref 11.5–15.5)
WBC: 11.8 10*3/uL — AB (ref 4.0–10.5)

## 2017-09-02 LAB — BASIC METABOLIC PANEL
ANION GAP: 12 (ref 5–15)
BUN: 7 mg/dL (ref 6–20)
CALCIUM: 8.9 mg/dL (ref 8.9–10.3)
CO2: 26 mmol/L (ref 22–32)
Chloride: 99 mmol/L — ABNORMAL LOW (ref 101–111)
Creatinine, Ser: 0.69 mg/dL (ref 0.61–1.24)
Glucose, Bld: 118 mg/dL — ABNORMAL HIGH (ref 65–99)
Potassium: 3.6 mmol/L (ref 3.5–5.1)
SODIUM: 137 mmol/L (ref 135–145)

## 2017-09-02 LAB — TYPE AND SCREEN
ABO/RH(D): A POS
Antibody Screen: NEGATIVE
Unit division: 0
Unit division: 0

## 2017-09-02 LAB — BPAM RBC
Blood Product Expiration Date: 201904222359
Blood Product Expiration Date: 201904242359
Unit Type and Rh: 6200
Unit Type and Rh: 6200

## 2017-09-02 MED ORDER — LUNG SURGERY BOOK
Freq: Once | Status: AC
  Administered 2017-09-02: 04:00:00
  Filled 2017-09-02: qty 1

## 2017-09-02 NOTE — Progress Notes (Signed)
3 Days Post-Op Procedure(s) (LRB): VIDEO ASSISTED THORACOSCOPY (VATS)/EMPYEMA    (Right) DECORTICATION of right lower lung lobe (Right) Subjective: Some discomfort at CT site  Objective: Vital signs in last 24 hours: Temp:  [98 F (36.7 C)-98.7 F (37.1 C)] 98 F (36.7 C) (04/07 0839) Pulse Rate:  [66-94] 81 (04/07 0000) Cardiac Rhythm: Normal sinus rhythm (04/06 2000) Resp:  [14-29] 17 (04/07 0811) BP: (108-139)/(74-93) 118/75 (04/07 0839) SpO2:  [96 %-99 %] 97 % (04/07 0355)  Hemodynamic parameters for last 24 hours:    Intake/Output from previous day: 04/06 0701 - 04/07 0700 In: 870 [P.O.:360; I.V.:10; IV Piggyback:500] Out: 1515 [Urine:1375; Chest Tube:140] Intake/Output this shift: No intake/output data recorded.  General appearance: alert, cooperative and no distress Neurologic: intact Heart: regular rate and rhythm Lungs: diminished BS right base, + rub Abdomen: normal findings: soft, non-tender no air leak, serosanguinous drainage from CTR  Lab Results: Recent Labs    09/01/17 0332 09/02/17 0715  WBC 10.8* 11.8*  HGB 10.1* 10.8*  HCT 33.0* 34.8*  PLT 347 420*   BMET:  Recent Labs    09/01/17 0332 09/02/17 0715  NA 136 137  K 4.0 3.6  CL 99* 99*  CO2 28 26  GLUCOSE 140* 118*  BUN 8 7  CREATININE 0.77 0.69  CALCIUM 8.4* 8.9    PT/INR: No results for input(s): LABPROT, INR in the last 72 hours. ABG    Component Value Date/Time   PHART 7.396 08/31/2017 0702   HCO3 27.6 08/31/2017 0702   TCO2 29 08/31/2017 0702   O2SAT 98.0 08/31/2017 0702   CBG (last 3)  Recent Labs    08/30/17 2118 08/30/17 2343 08/31/17 0347  GLUCAP 152* 136* 117*    Assessment/Plan: S/P Procedure(s) (LRB): VIDEO ASSISTED THORACOSCOPY (VATS)/EMPYEMA    (Right) DECORTICATION of right lower lung lobe (Right) Plan for transfer to step-down: see transfer orders  Awaiting bed on stepdown Drainage decreasing and CXR looks better this AM- keep CT another  day Ambulate Minimal use of PCA- dc and use oral pain meds SCD + enoxaparin for DVT prophylaxis   LOS: 8 days    Alex [name] 09/02/2017

## 2017-09-02 NOTE — Plan of Care (Signed)
Pt properly demonstrating  pulmonary toilet exercises without being prompted. I.S/ 1000-1250, breath sounds clear throughout O2 sats 96-99 % on room air.  Pt requiring minimal standby assistance while getting OOB or bathroom privileges. Ambulating frequently without difficulty or increase WOB.  Pt voiding after foley D/C'd without difficulty.  Minimal c/o surgical site pain, utilizing FD PCA fentanyl appropriately. OnQ reamins in place x 72 hrs. Pt appropriately using splinting pillow while C/DB.

## 2017-09-03 ENCOUNTER — Inpatient Hospital Stay (HOSPITAL_COMMUNITY): Payer: Self-pay

## 2017-09-03 MED ORDER — SODIUM CHLORIDE 0.9 % IV SOLN
2.0000 g | INTRAVENOUS | Status: DC
  Administered 2017-09-03: 2 g via INTRAVENOUS
  Filled 2017-09-03 (×2): qty 20

## 2017-09-03 NOTE — Plan of Care (Signed)
Continue current plan of care.

## 2017-09-03 NOTE — Progress Notes (Addendum)
[address]       [place] [address]             [phone]      4 Days Post-Op Procedure(s) (LRB): VIDEO ASSISTED THORACOSCOPY (VATS)/EMPYEMA    (Right) DECORTICATION of right lower lung lobe (Right) Subjective: conts to feel better, denies SOB, ambulating well. No fevers  Objective: Vital signs in last 24 hours: Temp:  [97.6 F (36.4 C)-98.8 F (37.1 C)] 97.9 F (36.6 C) (04/08 0711) Pulse Rate:  [64-96] 64 (04/08 0711) Cardiac Rhythm: Normal sinus rhythm (04/07 2045) Resp:  [14-22] 17 (04/08 0711) BP: (111-144)/(73-87) 124/82 (04/08 0711) SpO2:  [95 %-100 %] 97 % (04/08 0711)  Hemodynamic parameters for last 24 hours:    Intake/Output from previous day: 04/07 0701 - 04/08 0700 In: 1210 [P.O.:960; IV Piggyback:250] Out: 450 [Urine:400; Chest Tube:50] Intake/Output this shift: No intake/output data recorded.  General appearance: alert, cooperative and no distress Neurologic: intact Heart: regular rate and rhythm Lungs: dim in right lower fields Abdomen: benign Extremities: no edema or calf tenderness Wound: incis healing well, some echymosis  Lab Results: Recent Labs    09/01/17 0332 09/02/17 0715  WBC 10.8* 11.8*  HGB 10.1* 10.8*  HCT 33.0* 34.8*  PLT 347 420*   BMET:  Recent Labs    09/01/17 0332 09/02/17 0715  NA 136 137  K 4.0 3.6  CL 99* 99*  CO2 28 26  GLUCOSE 140* 118*  BUN 8 7  CREATININE 0.77 0.69  CALCIUM 8.4* 8.9    PT/INR: No results for input(s): LABPROT, INR in the last 72 hours. ABG    Component Value Date/Time   PHART 7.396 08/31/2017 0702   HCO3 27.6 08/31/2017 0702   TCO2 29 08/31/2017 0702   O2SAT 98.0 08/31/2017 0702   CBG (last 3)  No results for input(s): GLUCAP in the last 72 hours.  Meds Scheduled Meds: . acetaminophen  1,000 mg Oral Q6H   Or  . acetaminophen (TYLENOL) oral liquid 160 mg/5 mL  1,000 mg Oral Q6H  . aspirin EC  81 mg Oral Daily  . bisacodyl  10 mg Oral Daily  .  clopidogrel  75 mg Oral Daily  . enoxaparin (LOVENOX) injection  40 mg Subcutaneous Daily  . lactose free nutrition  237 mL Oral TID WC  . senna-docusate  1 tablet Oral QHS  . simvastatin  20 mg Oral QHS  . sodium chloride flush  3 mL Intravenous Q12H   Continuous Infusions: . bupivacaine 0.5 % ON-Q pump SINGLE CATH 400 mL    . dextrose 5 % and 0.45% NaCl 10 mL/hr at 09/02/17 0100  . piperacillin-tazobactam (ZOSYN)  IV 3.375 g (09/03/17 0533)  . potassium chloride    . vancomycin 750 mg (09/03/17 0534)   PRN Meds:.Influenza vac split quadrivalent PF, oxyCODONE, phenol, pneumococcal 23 valent vaccine, potassium chloride, sodium chloride flush, traMADol  Xrays Dg Chest Port 1 View  Result Date: 09/03/2017 CLINICAL DATA:  Pleural effusion.  Status post VATS. EXAM: PORTABLE CHEST 1 VIEW COMPARISON:  09/02/2017. FINDINGS: A single RIGHT chest tube remains in place at the RIGHT base hemithorax. RIGHT pleural effusion appears increased. There is no pneumothorax. Basilar atelectasis is increased as well. LEFT lung clear. Cardiomegaly. IMPRESSION: Increasing RIGHT base atelectasis and effusion. RIGHT chest tube good position. No pneumothorax. Electronically Signed   By: Staci Righter M.D.   On: 09/03/2017 07:36   Dg [facility] 1 View  Result  Date: 09/02/2017 CLINICAL DATA:  Follow-up loculated RIGHT pleural effusion/empyema. Postop day 3 decortication. Removal of 1 of the RIGHT chest tubes. EXAM: PORTABLE CHEST 1 VIEW COMPARISON:  09/01/2017, 08/31/2017 and earlier, including CT chest 08/26/2017. FINDINGS: Since yesterday, the more superiorly positioned of the RIGHT chest tubes have been removed. The LOWER RIGHT chest tube remains in place with its side hole potentially in the chest wall, having withdrawn slightly. No evidence of pneumothorax. Residual small RIGHT pleural effusion, unchanged. Improved aeration in the RIGHT lung base, with passive atelectasis and/or pneumonia persisting. LEFT lung remains  clear. Cardiac silhouette mildly enlarged, unchanged, with normal pulmonary vascularity. IMPRESSION: 1. No pneumothorax after removal of 1 of the 2 RIGHT chest tubes. The remaining RIGHT chest tube has withdrawn slightly since yesterday and its side hole may be in the chest wall. 2. Stable small RIGHT pleural effusion. 3. Improved aeration in the RIGHT lower lobe, with passive atelectasis and/or pneumonia persisting. 4. No new abnormalities. Electronically Signed   By: Evangeline Dakin M.D.   On: 09/02/2017 09:40    Recent Results (from the past 240 hour(s))  Culture, blood (routine x 2) Call MD if unable to obtain prior to antibiotics being given     Status: None   Collection Time: 08/26/17  2:30 AM  Result Value Ref Range Status   Specimen Description BLOOD LEFT ARM  Final   Special Requests   Final    BOTTLES DRAWN AEROBIC AND ANAEROBIC Blood Culture adequate volume   Culture   Final    NO GROWTH 5 DAYS Performed at [facility], [address] [place], [place] [address]    Report Status 08/31/2017 FINAL  Final  Culture, blood (routine x 2) Call MD if unable to obtain prior to antibiotics being given     Status: None   Collection Time: 08/26/17  2:40 AM  Result Value Ref Range Status   Specimen Description BLOOD RIGHT ARM  Final   Special Requests   Final    BOTTLES DRAWN AEROBIC ONLY Blood Culture adequate volume   Culture   Final    NO GROWTH 5 DAYS Performed at [facility], [place] [address], [place], [place] [address]    Report Status 08/31/2017 FINAL  Final  Culture, sputum-assessment     Status: None   Collection Time: 08/26/17  4:00 AM  Result Value Ref Range Status   Specimen Description SPUTUM  Final   Special Requests Normal  Final   Sputum evaluation   Final    Sputum specimen not acceptable for testing.  Please recollect.   Gram Stain Report Called to,Read Back By and Verified With: C STAMPER RN AT 0801 ON [id number] BY SJW Performed at [facility],  [place] [address], [place], [place] [address]    Report Status 08/26/2017 FINAL  Final  Surgical pcr screen     Status: None   Collection Time: 08/26/17  6:00 PM  Result Value Ref Range Status   MRSA, PCR NEGATIVE NEGATIVE Final   Staphylococcus aureus NEGATIVE NEGATIVE Final    Comment: (NOTE) The Xpert SA Assay (FDA approved for NASAL specimens in patients 84 years of age and older), is one component of a comprehensive surveillance program. It is not intended to diagnose infection nor to guide or monitor treatment. Performed at [facility], [place] [address] [place], [place] [address]   Aerobic/Anaerobic Culture (surgical/deep wound)     Status: None (Preliminary result)  Collection Time: 08/30/17  2:37 PM  Result Value Ref Range Status   Specimen Description FLUID PLEURAL RIGHT  Final   Special Requests PT ON VANC  Final   Gram Stain   Final    MODERATE WBC PRESENT,BOTH PMN AND MONONUCLEAR RARE GRAM POSITIVE COCCI    Culture   Final    CULTURE REINCUBATED FOR BETTER GROWTH Performed at [facility], [place] [address] [place], [place] [address]    Report Status PENDING  Incomplete  Fungus Culture With Stain     Status: None (Preliminary result)   Collection Time: 08/30/17  2:51 PM  Result Value Ref Range Status   Fungus Stain Final report  Final    Comment: (NOTE) Performed At: [facility] [place], [place] [id number] Rush Farmer MD [id number]    Fungus (Mycology) Culture PENDING  Incomplete   Fungal Source FLUID  Final    Comment: PLEURAL RIGHT Performed at [facility], [place] [address] [place], [place] [address]   Aerobic/Anaerobic Culture (surgical/deep wound)     Status: None (Preliminary result)   Collection Time: 08/30/17  2:51 PM  Result Value Ref Range Status   Specimen Description FLUID PLEURAL RIGHT  Final   Special Requests PATIENT ON FOLLOWING VANC AND ANCEF  Final   Gram Stain   Final    ABUNDANT WBC PRESENT,  PREDOMINANTLY PMN FEW GRAM POSITIVE COCCI Performed at [facility], [place] [address], [place], [place] [address]    Culture   Final    RARE VIRIDANS STREPTOCOCCUS CULTURE REINCUBATED FOR BETTER GROWTH NO ANAEROBES ISOLATED; CULTURE IN PROGRESS FOR 5 DAYS    Report Status PENDING  Incomplete  Acid Fast Smear (AFB)     Status: None   Collection Time: 08/30/17  2:51 PM  Result Value Ref Range Status   AFB Specimen Processing Concentration  Final   Acid Fast Smear Negative  Final    Comment: (NOTE) Performed At: [facility] [place], [place] [id number] Rush Farmer MD [id number]    Source (AFB) FLUID  Final    Comment: PLEURAL RIGHT Performed at [facility], [place] [address], [place], [place] [address]   Fungus Culture Result     Status: None   Collection Time: 08/30/17  2:51 PM  Result Value Ref Range Status   Result 1 Comment  Final    Comment: (NOTE) KOH/Calcofluor preparation:  no fungus observed. Performed At: [facility] [place], [place] [id number] Rush Farmer MD [id number] Performed at [facility], [place] [address], [place], [place] [address]     Assessment/Plan: S/P Procedure(s) (LRB): VIDEO ASSISTED THORACOSCOPY (VATS)/EMPYEMA    (Right) DECORTICATION of right lower lung lobe (Right)  1 conts to progress well 2 hemodyn stable in sinus rhythm 3 pulm status clinically stable on no O2 supplementation, cont routine pulm toilet for atx. Only 50 cc of serosang drainage- prob d/c tube today or tomorrow and transition to po abx 4 no new labs today  LOS: 9 days    John Giovanni 09/03/2017  remove chest tube today checkCXR in am IV vanc-zosyn until DC- then po Zyvox at home DC On-Q catheter Home 1-2 days  patient examined and medical record reviewed,agree with above note. Tharon Aquas Trigt III 09/03/2017

## 2017-09-03 NOTE — Care Management Note (Signed)
Case Management Note  Patient Details  Name: Alex [name] MRN: [id number] Date of Birth: Jul 08, 1950  Subjective/Objective:   Pt is s/p VATS                 Action/Plan:  PTA independent from home.  Pt confirmed he does not have insurance however he does have PCP that he would like to remain with.  Pt informed CM that he pays for his doctor visits and his medications out of pocket.  CM offered [facility] as resource however pt declined.     Expected Discharge Date:                  Expected Discharge Plan:  Home/Self Care  In-House Referral:     Discharge planning Services  CM Consult  Post Acute Care Choice:    Choice offered to:     DME Arranged:    DME Agency:     HH Arranged:    HH Agency:     Status of Service:  In process, will continue to follow  If discussed at Long Length of Stay Meetings, dates discussed:    Additional Comments:  Maryclare Labrador, RN 09/03/2017, 4:22 PM

## 2017-09-04 ENCOUNTER — Inpatient Hospital Stay (HOSPITAL_COMMUNITY): Payer: Self-pay

## 2017-09-04 LAB — CBC
HCT: 31.8 % — ABNORMAL LOW (ref 39.0–52.0)
Hemoglobin: 9.8 g/dL — ABNORMAL LOW (ref 13.0–17.0)
MCH: 25.8 pg — ABNORMAL LOW (ref 26.0–34.0)
MCHC: 30.8 g/dL (ref 30.0–36.0)
MCV: 83.7 fL (ref 78.0–100.0)
Platelets: 379 10*3/uL (ref 150–400)
RBC: 3.8 MIL/uL — ABNORMAL LOW (ref 4.22–5.81)
RDW: 14.4 % (ref 11.5–15.5)
WBC: 7.8 10*3/uL (ref 4.0–10.5)

## 2017-09-04 LAB — AEROBIC/ANAEROBIC CULTURE W GRAM STAIN (SURGICAL/DEEP WOUND)

## 2017-09-04 LAB — BASIC METABOLIC PANEL
Anion gap: 9 (ref 5–15)
BUN: 10 mg/dL (ref 6–20)
CO2: 26 mmol/L (ref 22–32)
Calcium: 8.5 mg/dL — ABNORMAL LOW (ref 8.9–10.3)
Chloride: 102 mmol/L (ref 101–111)
Creatinine, Ser: 0.69 mg/dL (ref 0.61–1.24)
GFR calc Af Amer: >60 mL/min (ref >60)
GFR calc non Af Amer: >60 mL/min (ref >60)
Glucose, Bld: 90 mg/dL (ref 65–99)
Potassium: 3.7 mmol/L (ref 3.5–5.1)
Sodium: 137 mmol/L (ref 135–145)

## 2017-09-04 MED ORDER — ACETAMINOPHEN 500 MG PO TABS: 1000.0000 mg | ORAL_TABLET | Freq: Four times a day (QID) | ORAL | 0 refills | Status: AC | PRN

## 2017-09-04 MED ORDER — OXYCODONE HCL 5 MG PO TABS: 5.0000 mg | ORAL_TABLET | ORAL | 0 refills | Status: DC | PRN

## 2017-09-04 MED ORDER — LINEZOLID 600 MG PO TABS: 600.0000 mg | ORAL_TABLET | Freq: Two times a day (BID) | ORAL | 0 refills | Status: DC

## 2017-09-04 NOTE — Progress Notes (Signed)
Pt discharged to home - all discharge instructions provided to patient with daughter at bedside, no questions or concerns at this time. Pts dsg CDI. Pt c/o no pain at time of d/c.

## 2017-09-04 NOTE — Progress Notes (Addendum)
      [address]       ,[place] [address]             [phone]      5 Days Post-Op Procedure(s) (LRB): VIDEO ASSISTED THORACOSCOPY (VATS)/EMPYEMA    (Right) DECORTICATION of right lower lung lobe (Right)   Subjective:  No new complaints denies shortness of breath, chest discomfort.  He states he feels pretty good.  Feels ready to go home today.   + ambulation  + BM  Objective: Vital signs in last 24 hours: Temp:  [97.6 F (36.4 C)-98 F (36.7 C)] 97.6 F (36.4 C) (04/09 0320) Pulse Rate:  [72-75] 75 (04/09 0320) Cardiac Rhythm: Normal sinus rhythm (04/09 0320) Resp:  [13-20] 18 (04/09 0320) BP: (123-146)/(70-78) 132/70 (04/09 0320) SpO2:  [95 %-98 %] 95 % (04/09 0320)  Intake/Output from previous day: 04/08 0701 - 04/09 0700 In: 969 [P.O.:419; I.V.:450; IV Piggyback:100] Out: 615 [Urine:615]  General appearance: alert, cooperative and no distress Heart: regular rate and rhythm Lungs: diminished breath sounds on right Abdomen: soft, non-tender; bowel sounds normal; no masses,  no organomegaly Extremities: extremities normal, atraumatic, no cyanosis or edema Wound: clean and dry  Lab Results: Recent Labs    09/02/17 0715 09/04/17 0157  WBC 11.8* 7.8  HGB 10.8* 9.8*  HCT 34.8* 31.8*  PLT 420* 379   BMET:  Recent Labs    09/02/17 0715 09/04/17 0157  NA 137 137  K 3.6 3.7  CL 99* 102  CO2 26 26  GLUCOSE 118* 90  BUN 7 10  CREATININE 0.69 0.69  CALCIUM 8.9 8.5*    PT/INR: No results for input(s): LABPROT, INR in the last 72 hours. ABG    Component Value Date/Time   PHART 7.396 08/31/2017 0702   HCO3 27.6 08/31/2017 0702   TCO2 29 08/31/2017 0702   O2SAT 98.0 08/31/2017 0702   CBG (last 3)  No results for input(s): GLUCAP in the last 72 hours.  Assessment/Plan: S/P Procedure(s) (LRB): VIDEO ASSISTED THORACOSCOPY (VATS)/EMPYEMA    (Right) DECORTICATION of right lower lung lobe (Right)  1. ID- Empyema- afebrile,  leukocytosis has resolved, will stop IV ABX today, and patient will be discharged on 14 days of Zyvox 2. CV- hemodynamically stable 3. Pulm- CXR with continued atelectasis on right, small amount of residual right side 4. Dispo- patient stable, will d/c today   LOS: 10 days    Ellwood Handler 09/04/2017  patient examined and medical record reviewed,agree with above note. Tharon Aquas Trigt III 09/04/2017

## 2017-09-04 NOTE — Discharge Summary (Addendum)
**Note Alex-Identified via Obfuscation** Physician Discharge Summary       Alex [name]       Alex [name],[name] [address]             [phone]    Patient ID: Alex [name] MRN: [id number] DOB/AGE: Aug 28, 1950 67 y.o.  Admit date: 08/25/2017 Discharge date: 09/04/2017  Admission Diagnoses: 1. Lobar pneumonia (Alex [name]) 2. Right empyema/pleural effusion (Alex [name])  Discharge Diagnoses:  1. S/p right VATS, mini thoracotomy for drainage of empyema and decortication 2. Anemia 3. Coronary artery and aortic atherosclerosis 4. History of benign essential HTN 5. History of stroke (Alex [name]) 6. History of carotid artery occlusion  Procedure (s):  1. Right VATS (video-assisted thoracoscopic surgery) for drainage of right empyema. 2. Decortication of right lower lobe by Dr. Prescott [name] on 08/31/2017.  History of Presenting Illness: This is a 67 year old nondiabetic non-smoker who has chronic dental disease and poor dental hygiene with right lower lobe pneumonia now with a sizable loculated empyema.  He is not toxic. He would benefit from right VATS drainage and decortication.  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  P2Y2 on 04/01 was 216. Plavix has been stopped and the patient will continue aspirin. Dr. Prescott [name] discussed the need for right VATS, right mini thoracotomy, drain empyema, and decortication. Potential risks, benefits, and complications of the surgery were discussed with the patient and he agreed to proceed with surgery.  Brief Hospital Course:  The patient remained afebrile and hemodynamically stable. A line and foley were removed early in the post operative course. He remained on Vancomycin and Zosyn. Gram stain showed few gram positive cocci and cultures howed rare Strep Viridans. Chest tube output gradually decreased and there was no air leak. Daily chest x rays were obtained and remained stable. All chest tubes were removed by 09/04/2017. Follow up chest x ray  showed persistent right base consolidation (atelectasis, small pleural effusion) . Per Dr. Prescott [name], Vancomycin and Zosyn to be stopped day of discharge and he well then take oral Zyvox. Patient is ambulating on room air. Patient is tolerating a diet and has had a bowel movement. Wounds are clean and dry. Patient is felt surgically stable for discharge today.   Latest Vital Signs: Blood pressure 132/70, pulse 75, temperature 97.6 F (36.4 C), temperature source Oral, resp. rate 18, height 5\' 6"  (1.676 m), weight 173 lb (78.5 kg), SpO2 95 %.  Physical Exam: General appearance: alert, cooperative and no distress Heart: regular rate and rhythm Lungs: diminished breath sounds on right Abdomen: soft, non-tender; bowel sounds normal; no masses,  no organomegaly Extremities: extremities normal, atraumatic, no cyanosis or edema Wound: clean and dry  Discharge Condition: Stable and discharged to home.  Recent laboratory studies:  Lab Results  Component Value Date   WBC 7.8 09/04/2017   HGB 9.8 (L) 09/04/2017   HCT 31.8 (L) 09/04/2017   MCV 83.7 09/04/2017   PLT 379 09/04/2017   Lab Results  Component Value Date   NA 137 09/04/2017   K 3.7 09/04/2017   CL 102 09/04/2017   CO2 26 09/04/2017   CREATININE 0.69 09/04/2017   GLUCOSE 90 09/04/2017    Diagnostic Studies: Dg Chest 2 View  Result Date: 09/04/2017 CLINICAL DATA:  Chest tube removal. EXAM: CHEST - 2 VIEW COMPARISON:  09/03/2017. FINDINGS: Mediastinum hilar structures normal. Heart size normal. Right base atelectasis/infiltrate right-sided pleural effusion again noted without interim change. No acute bony abnormality. IMPRESSION: Persistent right  base infiltrate/atelectasis and right-sided pleural effusion. No interim change. Electronically Signed   By: Alex [name]  Register   On: 09/04/2017 07:40   Ct Chest W Contrast  Result Date: 08/26/2017 CLINICAL DATA:  67 year old male with cough and RIGHT chest pain. RIGHT pleural effusion  identified on recent chest radiograph. EXAM: CT CHEST WITH CONTRAST TECHNIQUE: Multidetector CT imaging of the chest was performed during intravenous contrast administration. CONTRAST:  152mL ISOVUE-300 IOPAMIDOL (ISOVUE-300) INJECTION 61% COMPARISON:  08/25/2017 chest radiograph and 08/04/2017 chest CT FINDINGS: Cardiovascular: UPPER limits normal heart size again noted. Coronary artery and thoracic aortic atherosclerotic calcifications are again noted. There is no evidence of thoracic aortic aneurysm or pericardial effusion. Mediastinum/Nodes: No enlarged mediastinal, hilar, or axillary lymph nodes. Thyroid gland, trachea, and esophagus demonstrate no significant findings. Lungs/Pleura: A moderate loculated RIGHT pleural effusion has slightly enlarged, now measuring 6 x 10.5 x 10.8 cm. Adjacent RIGHT LOWER lobe consolidation/atelectasis again noted. No suspicious nodule, discrete mass, LEFT pleural effusion or pneumothorax noted. Upper Abdomen: No acute abnormality. Musculoskeletal: No chest wall abnormality. No acute or significant osseous findings. IMPRESSION: 1. Slight enlargement of moderate loculated RIGHT pleural effusion suspicious for empyema. Adjacent RIGHT LOWER lobe consolidation/atelectasis. 2. Coronary artery and Aortic Atherosclerosis (ICD10-I70.0). Electronically Signed   By: Alex [name] M.D.   On: 08/26/2017 07:16   Dg Chest Port 1 View  Result Date: 09/03/2017 CLINICAL DATA:  Pleural effusion.  Status post VATS. EXAM: PORTABLE CHEST 1 VIEW COMPARISON:  09/02/2017. FINDINGS: A single RIGHT chest tube remains in place at the RIGHT base hemithorax. RIGHT pleural effusion appears increased. There is no pneumothorax. Basilar atelectasis is increased as well. LEFT lung clear. Cardiomegaly. IMPRESSION: Increasing RIGHT base atelectasis and effusion. RIGHT chest tube good position. No pneumothorax. Electronically Signed   By: Alex [name] M.D.   On: 09/03/2017 07:36   Discharge  Medications: Allergies as of 09/04/2017   No Known Allergies     Medication List    TAKE these medications   acetaminophen 500 MG tablet Commonly known as:  TYLENOL Take 2 tablets (1,000 mg total) by mouth every 6 (six) hours as needed.   aspirin 81 MG EC tablet Take 81 mg by mouth daily.   clopidogrel 75 MG tablet Commonly known as:  PLAVIX Take 75 mg by mouth daily.   hydrochlorothiazide 25 MG tablet Commonly known as:  HYDRODIURIL Take 25 mg by mouth daily.   linezolid 600 MG tablet Commonly known as:  ZYVOX Take 1 tablet (600 mg total) by mouth 2 (two) times daily for 14 days.   oxyCODONE 5 MG immediate release tablet Commonly known as:  Oxy IR/ROXICODONE Take 1 tablet (5 mg total) by mouth every 4 (four) hours as needed for severe pain.   simvastatin 10 MG tablet Commonly known as:  ZOCOR Take 20 mg by mouth at bedtime.       Follow Up Appointments: Follow-up Information    Ivin Poot, MD Follow up on 09/26/2017.   Specialty:  Cardiothoracic Surgery Why:  Appointment is at 3:30.  please get CXR at 3:00 at [place] located on first floor of our office building Contact information: [place] [address] [phone]           Signed: Sharalyn Ink [facility] 09/04/2017, 8:02 AM   patient examined and medical record reviewed,agree with above note. Tharon Aquas Trigt III 09/04/2017

## 2017-09-05 LAB — AEROBIC/ANAEROBIC CULTURE W GRAM STAIN (SURGICAL/DEEP WOUND)

## 2017-09-13 ENCOUNTER — Emergency Department (HOSPITAL_COMMUNITY): Payer: Self-pay

## 2017-09-13 ENCOUNTER — Inpatient Hospital Stay (HOSPITAL_COMMUNITY)
Admission: EM | Admit: 2017-09-13 | Discharge: 2017-09-25 | DRG: 329 | Disposition: A | Discharge status: Home Health | Payer: Self-pay | Attending: Family Medicine | Admitting: Family Medicine

## 2017-09-13 ENCOUNTER — Encounter (HOSPITAL_COMMUNITY): Payer: Self-pay

## 2017-09-13 DIAGNOSIS — I10 Essential (primary) hypertension: Secondary | ICD-10-CM | POA: Diagnosis present

## 2017-09-13 DIAGNOSIS — I6529 Occlusion and stenosis of unspecified carotid artery: Secondary | ICD-10-CM | POA: Diagnosis present

## 2017-09-13 DIAGNOSIS — R0602 Shortness of breath: Secondary | ICD-10-CM

## 2017-09-13 DIAGNOSIS — E860 Dehydration: Secondary | ICD-10-CM | POA: Diagnosis present

## 2017-09-13 DIAGNOSIS — J869 Pyothorax without fistula: Secondary | ICD-10-CM | POA: Diagnosis present

## 2017-09-13 DIAGNOSIS — Z4659 Encounter for fitting and adjustment of other gastrointestinal appliance and device: Secondary | ICD-10-CM

## 2017-09-13 DIAGNOSIS — Z6827 Body mass index (BMI) 27.0-27.9, adult: Secondary | ICD-10-CM

## 2017-09-13 DIAGNOSIS — K648 Other hemorrhoids: Secondary | ICD-10-CM | POA: Diagnosis present

## 2017-09-13 DIAGNOSIS — E875 Hyperkalemia: Secondary | ICD-10-CM | POA: Diagnosis not present

## 2017-09-13 DIAGNOSIS — R112 Nausea with vomiting, unspecified: Secondary | ICD-10-CM | POA: Diagnosis present

## 2017-09-13 DIAGNOSIS — Z8701 Personal history of pneumonia (recurrent): Secondary | ICD-10-CM

## 2017-09-13 DIAGNOSIS — I739 Peripheral vascular disease, unspecified: Secondary | ICD-10-CM | POA: Diagnosis present

## 2017-09-13 DIAGNOSIS — R000 Tachycardia, unspecified: Secondary | ICD-10-CM | POA: Diagnosis present

## 2017-09-13 DIAGNOSIS — K567 Ileus, unspecified: Secondary | ICD-10-CM

## 2017-09-13 DIAGNOSIS — D509 Iron deficiency anemia, unspecified: Secondary | ICD-10-CM | POA: Diagnosis present

## 2017-09-13 DIAGNOSIS — I700 Atherosclerosis of aorta: Secondary | ICD-10-CM | POA: Diagnosis present

## 2017-09-13 DIAGNOSIS — C187 Malignant neoplasm of sigmoid colon: Principal | ICD-10-CM | POA: Diagnosis present

## 2017-09-13 DIAGNOSIS — K91840 Postprocedural hemorrhage and hematoma of a digestive system organ or structure following a digestive system procedure: Secondary | ICD-10-CM | POA: Diagnosis not present

## 2017-09-13 DIAGNOSIS — Z7902 Long term (current) use of antithrombotics/antiplatelets: Secondary | ICD-10-CM

## 2017-09-13 DIAGNOSIS — E876 Hypokalemia: Secondary | ICD-10-CM | POA: Diagnosis present

## 2017-09-13 DIAGNOSIS — E785 Hyperlipidemia, unspecified: Secondary | ICD-10-CM | POA: Diagnosis present

## 2017-09-13 DIAGNOSIS — K621 Rectal polyp: Secondary | ICD-10-CM | POA: Diagnosis present

## 2017-09-13 DIAGNOSIS — Z8673 Personal history of transient ischemic attack (TIA), and cerebral infarction without residual deficits: Secondary | ICD-10-CM

## 2017-09-13 DIAGNOSIS — E669 Obesity, unspecified: Secondary | ICD-10-CM | POA: Diagnosis present

## 2017-09-13 DIAGNOSIS — K089 Disorder of teeth and supporting structures, unspecified: Secondary | ICD-10-CM | POA: Diagnosis present

## 2017-09-13 DIAGNOSIS — R140 Abdominal distension (gaseous): Secondary | ICD-10-CM | POA: Diagnosis present

## 2017-09-13 DIAGNOSIS — Z7982 Long term (current) use of aspirin: Secondary | ICD-10-CM

## 2017-09-13 DIAGNOSIS — K56609 Unspecified intestinal obstruction, unspecified as to partial versus complete obstruction: Secondary | ICD-10-CM

## 2017-09-13 DIAGNOSIS — D62 Acute posthemorrhagic anemia: Secondary | ICD-10-CM | POA: Diagnosis not present

## 2017-09-13 DIAGNOSIS — Z823 Family history of stroke: Secondary | ICD-10-CM

## 2017-09-13 HISTORY — DX: Unspecified intestinal obstruction, unspecified as to partial versus complete obstruction: K56.609

## 2017-09-13 HISTORY — DX: Pure hypercholesterolemia, unspecified: E78.00

## 2017-09-13 LAB — COMPREHENSIVE METABOLIC PANEL
ALT: 48 U/L (ref 17–63)
AST: 44 U/L — ABNORMAL HIGH (ref 15–41)
Albumin: 4.2 g/dL (ref 3.5–5.0)
Alkaline Phosphatase: 73 U/L (ref 38–126)
Anion gap: 17 — ABNORMAL HIGH (ref 5–15)
BUN: 26 mg/dL — ABNORMAL HIGH (ref 6–20)
CO2: 25 mmol/L (ref 22–32)
Calcium: 9.9 mg/dL (ref 8.9–10.3)
Chloride: 91 mmol/L — ABNORMAL LOW (ref 101–111)
Creatinine, Ser: 0.89 mg/dL (ref 0.61–1.24)
GFR calc Af Amer: >60 mL/min (ref >60)
GFR calc non Af Amer: >60 mL/min (ref >60)
Glucose, Bld: 134 mg/dL — ABNORMAL HIGH (ref 65–99)
Potassium: 3 mmol/L — ABNORMAL LOW (ref 3.5–5.1)
Sodium: 133 mmol/L — ABNORMAL LOW (ref 135–145)
Total Bilirubin: 1.1 mg/dL (ref 0.3–1.2)
Total Protein: 8.4 g/dL — ABNORMAL HIGH (ref 6.5–8.1)

## 2017-09-13 LAB — CBC
HCT: 40.3 % (ref 39.0–52.0)
Hemoglobin: 13.5 g/dL (ref 13.0–17.0)
MCH: 27.3 pg (ref 26.0–34.0)
MCHC: 33.5 g/dL (ref 30.0–36.0)
MCV: 81.4 fL (ref 78.0–100.0)
Platelets: 427 K/uL — ABNORMAL HIGH (ref 150–400)
RBC: 4.95 MIL/uL (ref 4.22–5.81)
RDW: 14.9 % (ref 11.5–15.5)
WBC: 12.2 K/uL — ABNORMAL HIGH (ref 4.0–10.5)

## 2017-09-13 LAB — LIPASE, BLOOD: Lipase: 37 U/L (ref 11–51)

## 2017-09-13 NOTE — ED Notes (Signed)
Patient transported to Ultrasound 

## 2017-09-13 NOTE — ED Triage Notes (Signed)
Pt presents with no bowel movement since 4/3.  Pt was here for infection and discharged 4/4.  Pt has not eaten since Sunday,  Is not passing gas.  Pt reports abdominal distension and vomiting.

## 2017-09-14 ENCOUNTER — Emergency Department (HOSPITAL_COMMUNITY): Payer: Self-pay

## 2017-09-14 DIAGNOSIS — E785 Hyperlipidemia, unspecified: Secondary | ICD-10-CM | POA: Diagnosis present

## 2017-09-14 DIAGNOSIS — R140 Abdominal distension (gaseous): Secondary | ICD-10-CM | POA: Diagnosis present

## 2017-09-14 DIAGNOSIS — E876 Hypokalemia: Secondary | ICD-10-CM

## 2017-09-14 DIAGNOSIS — E860 Dehydration: Secondary | ICD-10-CM

## 2017-09-14 DIAGNOSIS — R112 Nausea with vomiting, unspecified: Secondary | ICD-10-CM

## 2017-09-14 DIAGNOSIS — Z8673 Personal history of transient ischemic attack (TIA), and cerebral infarction without residual deficits: Secondary | ICD-10-CM

## 2017-09-14 DIAGNOSIS — J869 Pyothorax without fistula: Secondary | ICD-10-CM

## 2017-09-14 DIAGNOSIS — I10 Essential (primary) hypertension: Secondary | ICD-10-CM

## 2017-09-14 DIAGNOSIS — K567 Ileus, unspecified: Secondary | ICD-10-CM

## 2017-09-14 HISTORY — DX: Abdominal distension (gaseous): R14.0

## 2017-09-14 HISTORY — DX: Nausea with vomiting, unspecified: R11.2

## 2017-09-14 HISTORY — DX: Hypokalemia: E87.6

## 2017-09-14 LAB — URINALYSIS, ROUTINE W REFLEX MICROSCOPIC
Bacteria, UA: NONE SEEN
Bilirubin Urine: NEGATIVE
Glucose, UA: NEGATIVE mg/dL
Hgb urine dipstick: NEGATIVE
Ketones, ur: 20 mg/dL — AB
Leukocytes, UA: NEGATIVE
Nitrite: NEGATIVE
Protein, ur: 30 mg/dL — AB
Specific Gravity, Urine: 1.039 — ABNORMAL HIGH (ref 1.005–1.030)
pH: 5 (ref 5.0–8.0)

## 2017-09-14 LAB — BASIC METABOLIC PANEL
ANION GAP: 13 (ref 5–15)
BUN: 24 mg/dL — ABNORMAL HIGH (ref 6–20)
CALCIUM: 8.7 mg/dL — AB (ref 8.9–10.3)
CHLORIDE: 94 mmol/L — AB (ref 101–111)
CO2: 26 mmol/L (ref 22–32)
Creatinine, Ser: 0.7 mg/dL (ref 0.61–1.24)
GFR calc Af Amer: >60 mL/min (ref >60)
GFR calc non Af Amer: >60 mL/min (ref >60)
GLUCOSE: 113 mg/dL — AB (ref 65–99)
Potassium: 2.8 mmol/L — ABNORMAL LOW (ref 3.5–5.1)
Sodium: 133 mmol/L — ABNORMAL LOW (ref 135–145)

## 2017-09-14 LAB — MAGNESIUM: Magnesium: 2.2 mg/dL (ref 1.7–2.4)

## 2017-09-14 LAB — CBC
HEMATOCRIT: 36.1 % — AB (ref 39.0–52.0)
HEMOGLOBIN: 11.7 g/dL — AB (ref 13.0–17.0)
MCH: 26.3 pg (ref 26.0–34.0)
MCHC: 32.4 g/dL (ref 30.0–36.0)
MCV: 81.1 fL (ref 78.0–100.0)
Platelets: 318 10*3/uL (ref 150–400)
RBC: 4.45 MIL/uL (ref 4.22–5.81)
RDW: 14.9 % (ref 11.5–15.5)
WBC: 11.3 10*3/uL — ABNORMAL HIGH (ref 4.0–10.5)

## 2017-09-14 LAB — I-STAT CG4 LACTIC ACID, ED: Lactic Acid, Venous: 1.97 mmol/L — ABNORMAL HIGH (ref 0.5–1.9)

## 2017-09-14 MED ORDER — IOPAMIDOL (ISOVUE-300) INJECTION 61%
50.0000 mL | Freq: Once | INTRAVENOUS | Status: AC | PRN
  Administered 2017-09-14: 50 mL via INTRAVENOUS

## 2017-09-14 MED ORDER — LINEZOLID 600 MG PO TABS
600.0000 mg | ORAL_TABLET | Freq: Two times a day (BID) | ORAL | Status: DC
  Administered 2017-09-14 – 2017-09-17 (×5): 600 mg via ORAL
  Filled 2017-09-14 (×7): qty 1

## 2017-09-14 MED ORDER — POTASSIUM CHLORIDE IN NACL 20-0.9 MEQ/L-% IV SOLN
INTRAVENOUS | Status: DC
  Administered 2017-09-14: 16:00:00 via INTRAVENOUS
  Filled 2017-09-14 (×2): qty 1000

## 2017-09-14 MED ORDER — CLOPIDOGREL BISULFATE 75 MG PO TABS
75.0000 mg | ORAL_TABLET | Freq: Every day | ORAL | Status: DC
  Administered 2017-09-14 – 2017-09-15 (×2): 75 mg via ORAL
  Filled 2017-09-14 (×2): qty 1

## 2017-09-14 MED ORDER — ZOLPIDEM TARTRATE 5 MG PO TABS: 5.0000 mg | ORAL_TABLET | Freq: Every evening | ORAL | Status: DC | PRN

## 2017-09-14 MED ORDER — ACETAMINOPHEN 325 MG PO TABS: 650.0000 mg | ORAL_TABLET | Freq: Four times a day (QID) | ORAL | Status: DC | PRN

## 2017-09-14 MED ORDER — DOCUSATE SODIUM 100 MG PO CAPS: 100.0000 mg | ORAL_CAPSULE | Freq: Two times a day (BID) | ORAL | Status: DC

## 2017-09-14 MED ORDER — SODIUM CHLORIDE 0.9 % IV BOLUS
1000.0000 mL | Freq: Once | INTRAVENOUS | Status: AC
  Administered 2017-09-14: 1000 mL via INTRAVENOUS

## 2017-09-14 MED ORDER — ONDANSETRON HCL 4 MG/2ML IJ SOLN
4.0000 mg | Freq: Three times a day (TID) | INTRAMUSCULAR | Status: DC | PRN
  Administered 2017-09-14 – 2017-09-20 (×10): 4 mg via INTRAVENOUS
  Filled 2017-09-14 (×9): qty 2

## 2017-09-14 MED ORDER — SODIUM CHLORIDE 0.9 % IV SOLN
INTRAVENOUS | Status: DC
  Administered 2017-09-14: 05:00:00 via INTRAVENOUS

## 2017-09-14 MED ORDER — SENNOSIDES-DOCUSATE SODIUM 8.6-50 MG PO TABS
1.0000 | ORAL_TABLET | Freq: Two times a day (BID) | ORAL | Status: DC
  Administered 2017-09-14: 1 via ORAL
  Filled 2017-09-14: qty 1

## 2017-09-14 MED ORDER — IOPAMIDOL (ISOVUE-300) INJECTION 61%
INTRAVENOUS | Status: AC
  Filled 2017-09-14: qty 50

## 2017-09-14 MED ORDER — ASPIRIN EC 81 MG PO TBEC
81.0000 mg | DELAYED_RELEASE_TABLET | Freq: Every day | ORAL | Status: DC
  Administered 2017-09-14 – 2017-09-15 (×2): 81 mg via ORAL
  Filled 2017-09-14 (×2): qty 1

## 2017-09-14 MED ORDER — SIMVASTATIN 20 MG PO TABS
20.0000 mg | ORAL_TABLET | Freq: Every day | ORAL | Status: DC
  Administered 2017-09-14 – 2017-09-15 (×2): 20 mg via ORAL
  Filled 2017-09-14 (×2): qty 1

## 2017-09-14 MED ORDER — POTASSIUM CHLORIDE 20 MEQ/15ML (10%) PO SOLN
40.0000 meq | Freq: Once | ORAL | Status: AC
  Administered 2017-09-14: 40 meq via ORAL
  Filled 2017-09-14: qty 30

## 2017-09-14 MED ORDER — ONDANSETRON HCL 4 MG/2ML IJ SOLN
4.0000 mg | Freq: Once | INTRAMUSCULAR | Status: AC
  Administered 2017-09-14: 4 mg via INTRAVENOUS
  Filled 2017-09-14: qty 2

## 2017-09-14 MED ORDER — HYDRALAZINE HCL 20 MG/ML IJ SOLN: 5.0000 mg | INTRAMUSCULAR | Status: DC | PRN

## 2017-09-14 MED ORDER — ENOXAPARIN SODIUM 40 MG/0.4ML ~~LOC~~ SOLN
40.0000 mg | SUBCUTANEOUS | Status: DC
  Administered 2017-09-14 – 2017-09-18 (×5): 40 mg via SUBCUTANEOUS
  Filled 2017-09-14 (×5): qty 0.4

## 2017-09-14 MED ORDER — POTASSIUM CHLORIDE 10 MEQ/100ML IV SOLN
10.0000 meq | INTRAVENOUS | Status: AC
  Administered 2017-09-14 (×6): 10 meq via INTRAVENOUS
  Filled 2017-09-14 (×6): qty 100

## 2017-09-14 NOTE — ED Notes (Signed)
Patient transferred to hospital bed.  

## 2017-09-14 NOTE — H&P (Signed)
History and Physical    Alex [name] VZD:[id number] DOB: December 11, 1950 DOA: 09/13/2017  Referring MD/NP/PA:   PCP: Antonietta Jewel, MD   Patient coming from:  The patient is coming from home.  At baseline, pt is independent for most of ADL.   Chief Complaint: Nausea, vomiting, abdominal distention  HPI: Alex [name] is a 67 y.o. male with medical history significant of hypertension, hyperlipidemia, stroke, carotid artery stenosis, recent pneumonia and empyema, who presents with nausea, vomiting, abdominal distention.  Patient was recently hospitalized from 3/30-4/9 and underwent decortication due to lobar pneumonia and empyema. He is still taking Zyvox (from 4/9 to 4/23). He states that he is recovering from pneumonia well. Currently no chest pain, shortness of breath or cough. He states that after he went home, he did not have bowel movement. He has been having nausea, vomiting and abdominal distention. Mostly he just has dry heaves since he was not able to eat normally. Patient denies abdominal pain and diarrhea. Denies fever or chills. Patient denies symptoms of UTI or unilateral weakness. Patient states that he treated himself with MiraLAX without help.  ED Course: pt was found to have WBC 12.2, negative urinalysis, lipase is 37, lactic acid 1.97, potassium 3.0, creatinine normal, no fever, no tachycardia, no tachypnea, O2 sat 95% on room air. Patient is placed on MedSurg bed for observation.  # CT-abd/pelvis showed  1. Slight wall thickening along the sigmoid colon, with gradual decompression. This may reflect an acute infectious or inflammatory process, with resultant colonic dysmotility. The remainder of the colon is diffusely filled with fluid and air. This would typically correspond to diarrhea; no stool is seen to suggest constipation. 2. Apparent small empyema at the right lung base, with diffusely thickened rind. Would correlate for any lung symptoms. Underlying atelectasis  noted.  Review of Systems:   General: no fevers, chills, no body weight gain, has poor appetite, has fatigue HEENT: no blurry vision, hearing changes or sore throat Respiratory: no dyspnea, coughing, wheezing CV: no chest pain, no palpitations GI: has nausea, vomiting, abdominal distention. No abdominal pain, diarrhea, constipation GU: no dysuria, burning on urination, increased urinary frequency, hematuria  Ext: no leg edema Neuro: no unilateral weakness, numbness, or tingling, no vision change or hearing loss Skin: no rash, no skin tear. MSK: No muscle spasm, no deformity, no limitation of range of movement in spin Heme: No easy bruising.  Travel history: No recent long distant travel.  Allergy: No Known Allergies  Past Medical History:  Diagnosis Date  . Aortic atherosclerosis ([place]) 08/27/2017  . Atherosclerosis of arteries   . Carotid artery occlusion   . Empyema ([place]) 08/27/2017  . Hypertension   . Pulmonary nodule, right 08/27/2017  . Stroke [facility])     Past Surgical History:  Procedure Laterality Date  . DECORTICATION Right 08/30/2017   Procedure: DECORTICATION of right lower lung lobe;  Surgeon: Prescott Gum, Collier Salina, MD;  Location: [place];  Service: Thoracic;  Laterality: Right;  . FOOT SURGERY    . VIDEO ASSISTED THORACOSCOPY (VATS)/EMPYEMA Right 08/30/2017   Procedure: VIDEO ASSISTED THORACOSCOPY (VATS)/EMPYEMA   ;  Surgeon: Ivin Poot, MD;  Location: [place];  Service: Thoracic;  Laterality: Right;    Social History:  reports that he has never smoked. He has never used smokeless tobacco. He reports that he does not drink alcohol or use drugs.  Family History:  Family History  Problem Relation Age of Onset  . Heart disease Other   .  Hypertension Father   . Stroke Father   . Heart disease Father   . Diabetes Sister   . Diabetes Brother      Prior to Admission medications   Medication Sig Start Date End Date Taking? Authorizing Provider  acetaminophen (TYLENOL) 500 MG  tablet Take 2 tablets (1,000 mg total) by mouth every 6 (six) hours as needed. 09/04/17  Yes Barrett, Erin R, PA-C  aspirin 81 MG EC tablet Take 81 mg by mouth daily.    Yes [provider]  clopidogrel (PLAVIX) 75 MG tablet Take 75 mg by mouth daily.   Yes [provider]  hydrochlorothiazide (HYDRODIURIL) 25 MG tablet Take 25 mg by mouth daily.   Yes [provider]  linezolid (ZYVOX) 600 MG tablet Take 1 tablet (600 mg total) by mouth 2 (two) times daily for 14 days. 09/04/17 09/18/17 Yes Barrett, Erin R, PA-C  simvastatin (ZOCOR) 10 MG tablet Take 20 mg by mouth at bedtime.    Yes [provider]  oxyCODONE (OXY IR/ROXICODONE) 5 MG immediate release tablet Take 1 tablet (5 mg total) by mouth every 4 (four) hours as needed for severe pain. Patient not taking: Reported on 09/14/2017 09/04/17   Marcene Corning    Physical Exam: Vitals:   09/14/17 0045 09/14/17 0115 09/14/17 0330 09/14/17 0400  BP: (!) 142/87 (!) 141/77 129/80 116/77  Pulse:  84 91 96  Resp:   16   Temp:      TempSrc:      SpO2:  98% 95% 97%  Weight:    78.5 kg (173 lb 1 oz)  Height:    5\' 6"  (1.676 m)   General: Not in acute distress HEENT:       Eyes: PERRL, EOMI, no scleral icterus.       ENT: No discharge from the ears and nose, no pharynx injection, no tonsillar enlargement.        Neck: No JVD, no bruit, no mass felt. Heme: No neck lymph node enlargement. Cardiac: S1/S2, RRR, No murmurs, No gallops or rubs. Respiratory: No rales, wheezing, rhonchi or rubs. GI: Soft, distended, nontender, no rebound pain, no organomegaly, BS present. GU: No hematuria Ext: No pitting leg edema bilaterally. 2+DP/PT pulse bilaterally. Musculoskeletal: No joint deformities, No joint redness or warmth, no limitation of ROM in spin. Skin: No rashes.  Neuro: Alert, oriented X3, cranial nerves II-XII grossly intact, moves all extremities normally.  Psych: Patient is not psychotic, no suicidal or  hemocidal ideation.  Labs on Admission: I have personally reviewed following labs and imaging studies  CBC: Recent Labs  Lab 09/13/17 2127 09/14/17 0418  WBC 12.2* 11.3*  HGB 13.5 11.7*  HCT 40.3 36.1*  MCV 81.4 81.1  PLT 427* 235   Basic Metabolic Panel: Recent Labs  Lab 09/13/17 2127 09/14/17 0418  NA 133* 133*  K 3.0* 2.8*  CL 91* 94*  CO2 25 26  GLUCOSE 134* 113*  BUN 26* 24*  CREATININE 0.89 0.70  CALCIUM 9.9 8.7*  MG  --  2.2   GFR: Estimated Creatinine Clearance: 89.5 mL/min (by C-G formula based on SCr of 0.7 mg/dL). Liver Function Tests: Recent Labs  Lab 09/13/17 2127  AST 44*  ALT 48  ALKPHOS 73  BILITOT 1.1  PROT 8.4*  ALBUMIN 4.2   Recent Labs  Lab 09/13/17 2127  LIPASE 37   No results for input(s): AMMONIA in the last 168 hours. Coagulation Profile: No results for input(s): INR, PROTIME  in the last 168 hours. Cardiac Enzymes: No results for input(s): CKTOTAL, CKMB, CKMBINDEX, TROPONINI in the last 168 hours. BNP (last 3 results) No results for input(s): PROBNP in the last 8760 hours. HbA1C: No results for input(s): HGBA1C in the last 72 hours. CBG: No results for input(s): GLUCAP in the last 168 hours. Lipid Profile: No results for input(s): CHOL, HDL, LDLCALC, TRIG, CHOLHDL, LDLDIRECT in the last 72 hours. Thyroid Function Tests: No results for input(s): TSH, T4TOTAL, FREET4, T3FREE, THYROIDAB in the last 72 hours. Anemia Panel: No results for input(s): VITAMINB12, FOLATE, FERRITIN, TIBC, IRON, RETICCTPCT in the last 72 hours. Urine analysis:    Component Value Date/Time   COLORURINE YELLOW 09/14/2017 0211   APPEARANCEUR CLEAR 09/14/2017 0211   LABSPEC 1.039 (H) 09/14/2017 0211   PHURINE 5.0 09/14/2017 0211   GLUCOSEU NEGATIVE 09/14/2017 0211   HGBUR NEGATIVE 09/14/2017 0211   BILIRUBINUR NEGATIVE 09/14/2017 0211   KETONESUR 20 (A) 09/14/2017 0211   PROTEINUR 30 (A) 09/14/2017 0211   UROBILINOGEN 1.0 02/15/2011 0431   NITRITE  NEGATIVE 09/14/2017 0211   LEUKOCYTESUR NEGATIVE 09/14/2017 0211   Sepsis Labs: @LABRCNTIP (procalcitonin:4,lacticidven:4) )No results found for this or any previous visit (from the past 240 hour(s)).   Radiological Exams on Admission: Dg Abdomen 1 View  Result Date: 09/13/2017 CLINICAL DATA:  Constipation, anorexia, abdominal distension and vomiting. EXAM: ABDOMEN - 1 VIEW COMPARISON:  None. FINDINGS: Gas-filled large bowel measuring to 6.7 cm. Gas-filled small bowel measuring to 3.2 cm. No though significant retained large bowel stool by routine radiography. No intra-abdominal mass effect or pathologic calcifications. Probable phleboliths project in RIGHT pelvis. Soft tissue planes and included osseous structures are nonsuspicious. IMPRESSION: Mild gas distended small and large bowel most compatible with mild ileus. Electronically Signed   By: Elon Alas M.D.   On: 09/13/2017 22:40   Ct Abdomen Pelvis W Contrast  Result Date: 09/14/2017 CLINICAL DATA:  No bowel movements for past 2 weeks. Generalized abdominal distention and vomiting. EXAM: CT ABDOMEN AND PELVIS WITH CONTRAST TECHNIQUE: Multidetector CT imaging of the abdomen and pelvis was performed using the standard protocol following bolus administration of intravenous contrast. CONTRAST:  100 mL ISOVUE-300 IOPAMIDOL (ISOVUE-300) INJECTION 61% COMPARISON:  Abdominal radiograph performed 09/13/2016 FINDINGS: Lower chest: There appears to be a small empyema at the right lung base, with diffusely thickened rind. Underlying atelectasis is noted. The visualized portions of the mediastinum are unremarkable. Hepatobiliary: The liver is unremarkable in appearance. The gallbladder is unremarkable in appearance. The common bile duct remains normal in caliber. Pancreas: The pancreas is within normal limits. Spleen: The spleen is unremarkable in appearance. Adrenals/Urinary Tract: The adrenal glands are unremarkable in appearance. Mild nonspecific  perinephric stranding is noted bilaterally. There is no evidence of hydronephrosis. No renal or ureteral stones are identified. Stomach/Bowel: The stomach is unremarkable in appearance. The small bowel is within normal limits. The appendix is normal in caliber, without evidence of appendicitis. There is slight wall thickening along the sigmoid colon, with gradual decompression. This may reflect an acute infectious or inflammatory process, with resultant dysmotility. The remainder of the colon is diffusely filled with fluid and air. This would typically correspond to diarrhea; no stool is seen. Vascular/Lymphatic: Scattered calcification is seen along the abdominal aorta and its branches. The abdominal aorta is otherwise grossly unremarkable. The inferior vena cava is grossly unremarkable. No retroperitoneal lymphadenopathy is seen. No pelvic sidewall lymphadenopathy is identified. Reproductive: The bladder is mildly distended and grossly unremarkable. The prostate is borderline  normal in size. Other: No additional soft tissue abnormalities are seen. Musculoskeletal: No acute osseous abnormalities are identified. The visualized musculature is unremarkable in appearance. IMPRESSION: 1. Slight wall thickening along the sigmoid colon, with gradual decompression. This may reflect an acute infectious or inflammatory process, with resultant colonic dysmotility. The remainder of the colon is diffusely filled with fluid and air. This would typically correspond to diarrhea; no stool is seen to suggest constipation. 2. Apparent small empyema at the right lung base, with diffusely thickened rind. Would correlate for any lung symptoms. Underlying atelectasis noted. Aortic Atherosclerosis (ICD10-I70.0). Electronically Signed   By: Garald Balding M.D.   On: 09/14/2017 02:15     EKG:  Not done in ED, will get one.   Assessment/Plan Principal Problem:   Nausea & vomiting Active Problems:   History of stroke   Empyema  (HCC)   Benign essential HTN   Abdominal distension   Dehydration   Hypokalemia   HLD (hyperlipidemia)   Nausea & vomiting and abdominal distention: Etiology is not clear. Lipase is 37. Patient has active bowel sounds. CT abdomen/pelvis showed wall thickening in the sigmoid colon, but no obstruction. Since patient does not have fever or diarrhea, I will not start new antibiotics.  -will placed on MedSurg bed for observation -start colace and senokot -IVF: 1L NS, and then 125 cc/h - prn zofran for nausea -if the patient developed fever or diarrhea, will start antibiotics to treat for colitis.  History of stroke: -asa and zocro  HLD: -Zocor  Hypokalemia: K=3.0  on admission. - Repleted - Check Mg level  HTN: blood pressure 129/80 -hold HCTZ due to and dehydration -IV hydralazine prn  Recent hx of Empyema [facility]): s/p of decortication. Nochest pain, cough, shortness rest, fever. -Continue Zyvox   DVT ppx:  SQ Lovenox Code Status: Full code Family Communication:    Yes, patient's daughter and son-in law at bed side Disposition Plan:  Anticipate discharge back to previous home environment Consults called:  none Admission status:  medical floor/obs   Date of Service 09/14/2017    Ivor Costa [facility] Pager [phone]  If 7PM-7AM, please contact night-coverage [email or web address] Password [email or web address] 09/14/2017, 5:11 AM

## 2017-09-14 NOTE — ED Provider Notes (Signed)
Patient presented to the ER with abdominal pain, decreased bowel movements.  Patient reports having had recent thoracic surgery for an empyema.  He has not had a bowel movement for approximately a week.  He has been using laxatives without improvement.  Patient reports nausea and vomiting associated with the pain.  Face to face Exam: HEENT - PERRLA Lungs - CTAB Heart - RRR, no M/R/G Abd -distended, tympanitic, hyperactive bowel sounds Neuro - alert, oriented x3  Plan: CT scan does not suggest ileus or small bowel obstruction.  No evidence of constipation.  There is concern for colitis.  Patient having difficulty with oral intake, will have hospitalist admit.   Orpah Greek, MD 09/14/17 [phone]

## 2017-09-14 NOTE — ED Notes (Signed)
RN attempted report; Floor Rn currently in room with interpreter.  RN gave number to call back for report.

## 2017-09-14 NOTE — ED Notes (Signed)
Pt reports not having a bowel movement since surgery.Pt states Abdominal discomfort w/ nausea

## 2017-09-14 NOTE — ED Notes (Signed)
Pt moved to room E48 and placed on monitor.

## 2017-09-14 NOTE — ED Notes (Signed)
Pt has 1L NS running at 92mL/hr as a KVO

## 2017-09-14 NOTE — ED Notes (Signed)
Patient transported to CT 

## 2017-09-14 NOTE — Progress Notes (Addendum)
PROGRESS NOTE    Alex [name]  ZOX:[id number] DOB: 1951/02/14 DOA: 09/13/2017 PCP: Antonietta Jewel, MD  Outpatient Specialists:   Brief Narrative: Patient is a 67 year old Caucasian male with past medical history significant for hypertension, hyperlipidemia, stroke, carotid artery stenosis, recent pneumonia and empyema, who presents with nausea, vomiting, abdominal distention.  Potassium of 2.8 is noted as well.  Abdominal CT scan and KUB noted.  According to the patient, he has not had bowel movement in about 2 hours.  Also reported abdominal distention.  Patient has been obtained for further assessment and management.  Assessment & Plan:   Principal Problem:   Nausea & vomiting Active Problems:   History of stroke   Empyema (HCC)   Benign essential HTN   Abdominal distension   Dehydration   Hypokalemia   HLD (hyperlipidemia)   Nausea & vomiting and abdominal distention:  -Likely secondary to ileus, but cannot rule out bowel obstruction. -Keep patient n.p.o. -IVF: 1L NS, and then 125 cc/h -We will consider NG tube to low suction. -Will correct abnormal electrolytes, potassium is noted to be 2.8. -We will continue to monitor electrolytes and correct accordingly.  History of stroke: -asa and zocor  HLD: -Zocor  Hypokalemia: K=3.0  on admission. Potassium is 2.8 today - Replete -Keep potassium greater than 4 - Check Mg level  HTN: blood pressure 129/80 -hold HCTZ  -IV hydralazine prn  Recent hx of Empyema [facility]): s/p of decortication. Nochest pain, cough, shortness rest, fever. -Continue Zyvox   DVT ppx:  SQ Lovenox Code Status: Full code Family Communication:     Disposition Plan:   This will depend on hospital course.  Consultants:   Note threshold to consult the surgical team.  Procedures:   None  Antimicrobials:   Zyvox   Subjective: No new complaints.  No fever or chills.  No chest pain.  Objective: Vitals:   09/14/17 0900 09/14/17  0930 09/14/17 1000 09/14/17 1122  BP: 122/77 128/70 116/69 122/73  Pulse: 85 81 77 78  Resp:    17  Temp:      TempSrc:      SpO2: 97% 97% 95% 96%  Weight:      Height:        Intake/Output Summary (Last 24 hours) at 09/14/2017 1354 Last data filed at 09/14/2017 1306 Gross per 24 hour  Intake 2000 ml  Output -  Net 2000 ml   Filed Weights   09/14/17 0400  Weight: 78.5 kg (173 lb 1 oz)    Examination:  General exam: Appears calm and comfortable  Respiratory system: Decreased air entry right lung base posteriorly.   Cardiovascular system: S1 & S2, ? systolic murmur. Gastrointestinal system: Abdomen is distended, soft and non tender. Hyperactive bowel sound. Central nervous system: Alert and oriented. No focal neurological deficits. Extremities: Symmetric 5 x 5 power.   Data Reviewed: I have personally reviewed following labs and imaging studies  CBC: Recent Labs  Lab 09/13/17 2127 09/14/17 0418  WBC 12.2* 11.3*  HGB 13.5 11.7*  HCT 40.3 36.1*  MCV 81.4 81.1  PLT 427* 811   Basic Metabolic Panel: Recent Labs  Lab 09/13/17 2127 09/14/17 0418  NA 133* 133*  K 3.0* 2.8*  CL 91* 94*  CO2 25 26  GLUCOSE 134* 113*  BUN 26* 24*  CREATININE 0.89 0.70  CALCIUM 9.9 8.7*  MG  --  2.2   GFR: Estimated Creatinine Clearance: 89.5 mL/min (by C-G formula based on SCr of 0.7 mg/dL).  Liver Function Tests: Recent Labs  Lab 09/13/17 2127  AST 44*  ALT 48  ALKPHOS 73  BILITOT 1.1  PROT 8.4*  ALBUMIN 4.2   Recent Labs  Lab 09/13/17 2127  LIPASE 37   No results for input(s): AMMONIA in the last 168 hours. Coagulation Profile: No results for input(s): INR, PROTIME in the last 168 hours. Cardiac Enzymes: No results for input(s): CKTOTAL, CKMB, CKMBINDEX, TROPONINI in the last 168 hours. BNP (last 3 results) No results for input(s): PROBNP in the last 8760 hours. HbA1C: No results for input(s): HGBA1C in the last 72 hours. CBG: No results for input(s): GLUCAP  in the last 168 hours. Lipid Profile: No results for input(s): CHOL, HDL, LDLCALC, TRIG, CHOLHDL, LDLDIRECT in the last 72 hours. Thyroid Function Tests: No results for input(s): TSH, T4TOTAL, FREET4, T3FREE, THYROIDAB in the last 72 hours. Anemia Panel: No results for input(s): VITAMINB12, FOLATE, FERRITIN, TIBC, IRON, RETICCTPCT in the last 72 hours. Urine analysis:    Component Value Date/Time   COLORURINE YELLOW 09/14/2017 0211   APPEARANCEUR CLEAR 09/14/2017 0211   LABSPEC 1.039 (H) 09/14/2017 0211   PHURINE 5.0 09/14/2017 0211   GLUCOSEU NEGATIVE 09/14/2017 0211   HGBUR NEGATIVE 09/14/2017 0211   BILIRUBINUR NEGATIVE 09/14/2017 0211   KETONESUR 20 (A) 09/14/2017 0211   PROTEINUR 30 (A) 09/14/2017 0211   UROBILINOGEN 1.0 02/15/2011 0431   NITRITE NEGATIVE 09/14/2017 0211   LEUKOCYTESUR NEGATIVE 09/14/2017 0211   Sepsis Labs: @LABRCNTIP (procalcitonin:4,lacticidven:4)  )No results found for this or any previous visit (from the past 240 hour(s)).       Radiology Studies: Dg Abdomen 1 View  Result Date: 09/13/2017 CLINICAL DATA:  Constipation, anorexia, abdominal distension and vomiting. EXAM: ABDOMEN - 1 VIEW COMPARISON:  None. FINDINGS: Gas-filled large bowel measuring to 6.7 cm. Gas-filled small bowel measuring to 3.2 cm. No though significant retained large bowel stool by routine radiography. No intra-abdominal mass effect or pathologic calcifications. Probable phleboliths project in RIGHT pelvis. Soft tissue planes and included osseous structures are nonsuspicious. IMPRESSION: Mild gas distended small and large bowel most compatible with mild ileus. Electronically Signed   By: Elon Alas M.D.   On: 09/13/2017 22:40   Ct Abdomen Pelvis W Contrast  Result Date: 09/14/2017 CLINICAL DATA:  No bowel movements for past 2 weeks. Generalized abdominal distention and vomiting. EXAM: CT ABDOMEN AND PELVIS WITH CONTRAST TECHNIQUE: Multidetector CT imaging of the abdomen and  pelvis was performed using the standard protocol following bolus administration of intravenous contrast. CONTRAST:  100 mL ISOVUE-300 IOPAMIDOL (ISOVUE-300) INJECTION 61% COMPARISON:  Abdominal radiograph performed 09/13/2016 FINDINGS: Lower chest: There appears to be a small empyema at the right lung base, with diffusely thickened rind. Underlying atelectasis is noted. The visualized portions of the mediastinum are unremarkable. Hepatobiliary: The liver is unremarkable in appearance. The gallbladder is unremarkable in appearance. The common bile duct remains normal in caliber. Pancreas: The pancreas is within normal limits. Spleen: The spleen is unremarkable in appearance. Adrenals/Urinary Tract: The adrenal glands are unremarkable in appearance. Mild nonspecific perinephric stranding is noted bilaterally. There is no evidence of hydronephrosis. No renal or ureteral stones are identified. Stomach/Bowel: The stomach is unremarkable in appearance. The small bowel is within normal limits. The appendix is normal in caliber, without evidence of appendicitis. There is slight wall thickening along the sigmoid colon, with gradual decompression. This may reflect an acute infectious or inflammatory process, with resultant dysmotility. The remainder of the colon is diffusely filled with fluid and  air. This would typically correspond to diarrhea; no stool is seen. Vascular/Lymphatic: Scattered calcification is seen along the abdominal aorta and its branches. The abdominal aorta is otherwise grossly unremarkable. The inferior vena cava is grossly unremarkable. No retroperitoneal lymphadenopathy is seen. No pelvic sidewall lymphadenopathy is identified. Reproductive: The bladder is mildly distended and grossly unremarkable. The prostate is borderline normal in size. Other: No additional soft tissue abnormalities are seen. Musculoskeletal: No acute osseous abnormalities are identified. The visualized musculature is unremarkable  in appearance. IMPRESSION: 1. Slight wall thickening along the sigmoid colon, with gradual decompression. This may reflect an acute infectious or inflammatory process, with resultant colonic dysmotility. The remainder of the colon is diffusely filled with fluid and air. This would typically correspond to diarrhea; no stool is seen to suggest constipation. 2. Apparent small empyema at the right lung base, with diffusely thickened rind. Would correlate for any lung symptoms. Underlying atelectasis noted. Aortic Atherosclerosis (ICD10-I70.0). Electronically Signed   By: Garald Balding M.D.   On: 09/14/2017 02:15        Scheduled Meds: . aspirin EC  81 mg Oral Daily  . clopidogrel  75 mg Oral Daily  . enoxaparin (LOVENOX) injection  40 mg Subcutaneous Q24H  . linezolid  600 mg Oral BID  . simvastatin  20 mg Oral QHS   Continuous Infusions: . 0.9 % NaCl with KCl 20 mEq / L    . potassium chloride       LOS: 0 days    Time spent: 35 Minutes.    Dana Allan, MD  [facility] Pager #: [phone] 7PM-7AM contact night coverage as above

## 2017-09-14 NOTE — ED Provider Notes (Signed)
[facility] EMERGENCY DEPARTMENT Provider Note   CSN: [id number] Arrival date & time: 09/13/17  2008     History   Chief Complaint Chief Complaint  Patient presents with  . Emesis    HPI Alex [name] is a 67 y.o. male.  HPI 67 y.o. male with medical history significant of hypertension, hyperlipidemia, stroke, carotid artery stenosis, recent pneumonia and empyema, who presents with nausea, vomiting, abdominal distention.  Patient was recently hospitalized on 3/30 through 4/9 for pneumonia that required thoracentesis for empyema drainage.  Patient was started on Zyvox which he has been taking and has 4 more doses.  Patient states that while in the hospital and after being discharged he has not had a bowel movement.  States that he has been having nausea, vomiting and abdominal distention.  Patient reports mostly dry heaves since he was not able to eat normally.  Patient denies any associated abdominal pain or diarrhea.  Has been using over-the-counter stool softeners, enemas and MiraLAX without any relief.  Patient denies any urinary symptoms.  Denies any history of abdominal surgeries.  Patient denies passing gas.  Nothing makes symptoms better or worse.  Pt denies any fever, chill, ha, vision changes, lightheadedness, dizziness, congestion, neck pain, cp, sob, cough, urinary symptoms, change in bowel habits, melena, hematochezia, lower extremity paresthesias.    Past Medical History:  Diagnosis Date  . Aortic atherosclerosis ([place]) 08/27/2017  . Atherosclerosis of arteries   . Carotid artery occlusion   . Empyema ([place]) 08/27/2017  . Hypertension   . Pulmonary nodule, right 08/27/2017  . Stroke [facility])     Patient Active Problem List   Diagnosis Date Noted  . Abdominal distension 09/14/2017  . Nausea & vomiting 09/14/2017  . Hypokalemia 09/14/2017  . HLD (hyperlipidemia) 09/14/2017  . Dehydration   . Empyema of right pleural space ([place]) 08/30/2017  . Lobar  pneumonia ([place]) 08/27/2017  . Empyema ([place]) 08/27/2017  . Pleural effusion, right 08/27/2017  . Pulmonary nodule, right 08/27/2017  . Benign essential HTN 08/27/2017  . Aortic atherosclerosis ([place]) 08/27/2017  . Anemia 08/26/2017  . Pleural effusion 08/25/2017  . Community acquired pneumonia of right lower lobe of lung ([place]) 08/04/2017  . History of stroke 08/04/2017  . Occlusion and stenosis of carotid artery without mention of cerebral infarction 09/07/2011    Past Surgical History:  Procedure Laterality Date  . DECORTICATION Right 08/30/2017   Procedure: DECORTICATION of right lower lung lobe;  Surgeon: Prescott Gum, Collier Salina, MD;  Location: [place];  Service: Thoracic;  Laterality: Right;  . FOOT SURGERY    . VIDEO ASSISTED THORACOSCOPY (VATS)/EMPYEMA Right 08/30/2017   Procedure: VIDEO ASSISTED THORACOSCOPY (VATS)/EMPYEMA   ;  Surgeon: Ivin Poot, MD;  Location: [facility];  Service: Thoracic;  Laterality: Right;        Home Medications    Prior to Admission medications   Medication Sig Start Date End Date Taking? Authorizing Provider  acetaminophen (TYLENOL) 500 MG tablet Take 2 tablets (1,000 mg total) by mouth every 6 (six) hours as needed. 09/04/17  Yes Barrett, Erin R, PA-C  aspirin 81 MG EC tablet Take 81 mg by mouth daily.    Yes [provider]  clopidogrel (PLAVIX) 75 MG tablet Take 75 mg by mouth daily.   Yes [provider]  hydrochlorothiazide (HYDRODIURIL) 25 MG tablet Take 25 mg by mouth daily.   Yes [provider]  linezolid (ZYVOX) 600 MG tablet Take 1 tablet (600 mg total)  by mouth 2 (two) times daily for 14 days. 09/04/17 09/18/17 Yes Barrett, Erin R, PA-C  simvastatin (ZOCOR) 10 MG tablet Take 20 mg by mouth at bedtime.    Yes [provider]  oxyCODONE (OXY IR/ROXICODONE) 5 MG immediate release tablet Take 1 tablet (5 mg total) by mouth every 4 (four) hours as needed for severe pain. Patient not taking: Reported on 09/14/2017 09/04/17    Barrett, Lodema Hong, PA-C    Family History Family History  Problem Relation Age of Onset  . Heart disease Other   . Hypertension Father   . Stroke Father   . Heart disease Father   . Diabetes Sister   . Diabetes Brother     Social History Social History   Tobacco Use  . Smoking status: Never Smoker  . Smokeless tobacco: Never Used  Substance Use Topics  . Alcohol use: No    Alcohol/week: 0.0 oz  . Drug use: No     Allergies   Patient has no known allergies.   Review of Systems Review of Systems  All other systems reviewed and are negative.    Physical Exam Updated Vital Signs BP 123/82   Pulse 87   Temp 97.7 F (36.5 C) (Oral)   Resp 18   Ht 5\' 6"  (1.676 m)   Wt 78.5 kg (173 lb 1 oz)   SpO2 99%   BMI 27.93 kg/m   Physical Exam  Constitutional: He is oriented to person, place, and time. He appears well-developed and well-nourished.  Non-toxic appearance. No distress.  HENT:  Head: Normocephalic and atraumatic.  Mucous membranes dry.  Eyes: Pupils are equal, round, and reactive to light. Conjunctivae are normal. Right eye exhibits no discharge. Left eye exhibits no discharge.  Neck: Normal range of motion. Neck supple.  Cardiovascular: Normal rate, regular rhythm, normal heart sounds and intact distal pulses. Exam reveals no gallop and no friction rub.  No murmur heard. Pulmonary/Chest: Effort normal and breath sounds normal. No stridor. No respiratory distress. He has no wheezes. He has no rales. He exhibits no tenderness.  Abdominal: Soft. Normal appearance. He exhibits distension. Bowel sounds are increased. There is no tenderness. There is no rigidity, no rebound, no guarding, no CVA tenderness, no tenderness at McBurney's point and negative Murphy's sign.  Musculoskeletal: Normal range of motion. He exhibits no tenderness.  Lymphadenopathy:    He has no cervical adenopathy.  Neurological: He is alert and oriented to person, place, and time.  Skin: Skin  is warm and dry. Capillary refill takes less than 2 seconds. No rash noted.  Psychiatric: His behavior is normal. Judgment and thought content normal.  Nursing note and vitals reviewed.    ED Treatments / Results  Labs (all labs ordered are listed, but only abnormal results are displayed) Labs Reviewed  COMPREHENSIVE METABOLIC PANEL - Abnormal; Notable for the following components:      Result Value   Sodium 133 (*)    Potassium 3.0 (*)    Chloride 91 (*)    Glucose, Bld 134 (*)    BUN 26 (*)    Total Protein 8.4 (*)    AST 44 (*)    Anion gap 17 (*)    All other components within normal limits  CBC - Abnormal; Notable for the following components:   WBC 12.2 (*)    Platelets 427 (*)    All other components within normal limits  URINALYSIS, ROUTINE W REFLEX MICROSCOPIC - Abnormal; Notable for the  following components:   Specific Gravity, Urine 1.039 (*)    Ketones, ur 20 (*)    Protein, ur 30 (*)    Squamous Epithelial / LPF 0-5 (*)    All other components within normal limits  BASIC METABOLIC PANEL - Abnormal; Notable for the following components:   Sodium 133 (*)    Potassium 2.8 (*)    Chloride 94 (*)    Glucose, Bld 113 (*)    BUN 24 (*)    Calcium 8.7 (*)    All other components within normal limits  CBC - Abnormal; Notable for the following components:   WBC 11.3 (*)    Hemoglobin 11.7 (*)    HCT 36.1 (*)    All other components within normal limits  I-STAT CG4 LACTIC ACID, ED - Abnormal; Notable for the following components:   Lactic Acid, Venous 1.97 (*)    All other components within normal limits  LIPASE, BLOOD  MAGNESIUM    EKG None  Radiology Dg Abdomen 1 View  Result Date: 09/13/2017 CLINICAL DATA:  Constipation, anorexia, abdominal distension and vomiting. EXAM: ABDOMEN - 1 VIEW COMPARISON:  None. FINDINGS: Gas-filled large bowel measuring to 6.7 cm. Gas-filled small bowel measuring to 3.2 cm. No though significant retained large bowel stool by  routine radiography. No intra-abdominal mass effect or pathologic calcifications. Probable phleboliths project in RIGHT pelvis. Soft tissue planes and included osseous structures are nonsuspicious. IMPRESSION: Mild gas distended small and large bowel most compatible with mild ileus. Electronically Signed   By: Elon Alas M.D.   On: 09/13/2017 22:40   Ct Abdomen Pelvis W Contrast  Result Date: 09/14/2017 CLINICAL DATA:  No bowel movements for past 2 weeks. Generalized abdominal distention and vomiting. EXAM: CT ABDOMEN AND PELVIS WITH CONTRAST TECHNIQUE: Multidetector CT imaging of the abdomen and pelvis was performed using the standard protocol following bolus administration of intravenous contrast. CONTRAST:  100 mL ISOVUE-300 IOPAMIDOL (ISOVUE-300) INJECTION 61% COMPARISON:  Abdominal radiograph performed 09/13/2016 FINDINGS: Lower chest: There appears to be a small empyema at the right lung base, with diffusely thickened rind. Underlying atelectasis is noted. The visualized portions of the mediastinum are unremarkable. Hepatobiliary: The liver is unremarkable in appearance. The gallbladder is unremarkable in appearance. The common bile duct remains normal in caliber. Pancreas: The pancreas is within normal limits. Spleen: The spleen is unremarkable in appearance. Adrenals/Urinary Tract: The adrenal glands are unremarkable in appearance. Mild nonspecific perinephric stranding is noted bilaterally. There is no evidence of hydronephrosis. No renal or ureteral stones are identified. Stomach/Bowel: The stomach is unremarkable in appearance. The small bowel is within normal limits. The appendix is normal in caliber, without evidence of appendicitis. There is slight wall thickening along the sigmoid colon, with gradual decompression. This may reflect an acute infectious or inflammatory process, with resultant dysmotility. The remainder of the colon is diffusely filled with fluid and air. This would typically  correspond to diarrhea; no stool is seen. Vascular/Lymphatic: Scattered calcification is seen along the abdominal aorta and its branches. The abdominal aorta is otherwise grossly unremarkable. The inferior vena cava is grossly unremarkable. No retroperitoneal lymphadenopathy is seen. No pelvic sidewall lymphadenopathy is identified. Reproductive: The bladder is mildly distended and grossly unremarkable. The prostate is borderline normal in size. Other: No additional soft tissue abnormalities are seen. Musculoskeletal: No acute osseous abnormalities are identified. The visualized musculature is unremarkable in appearance. IMPRESSION: 1. Slight wall thickening along the sigmoid colon, with gradual decompression. This may reflect an acute  infectious or inflammatory process, with resultant colonic dysmotility. The remainder of the colon is diffusely filled with fluid and air. This would typically correspond to diarrhea; no stool is seen to suggest constipation. 2. Apparent small empyema at the right lung base, with diffusely thickened rind. Would correlate for any lung symptoms. Underlying atelectasis noted. Aortic Atherosclerosis (ICD10-I70.0). Electronically Signed   By: Garald Balding M.D.   On: 09/14/2017 02:15    Procedures Procedures (including critical care time)  Medications Ordered in ED Medications  iopamidol (ISOVUE-300) 61 % injection (has no administration in time range)  ondansetron (ZOFRAN) injection 4 mg (4 mg Intravenous Given 09/14/17 0454)  acetaminophen (TYLENOL) tablet 650 mg (has no administration in time range)  aspirin EC tablet 81 mg (has no administration in time range)  clopidogrel (PLAVIX) tablet 75 mg (has no administration in time range)  linezolid (ZYVOX) tablet 600 mg (has no administration in time range)  simvastatin (ZOCOR) tablet 20 mg (has no administration in time range)  docusate sodium (COLACE) capsule 100 mg (has no administration in time range)  senna-docusate  (Senokot-S) tablet 1 tablet (has no administration in time range)  0.9 %  sodium chloride infusion ( Intravenous New Bag/Given 09/14/17 0453)  enoxaparin (LOVENOX) injection 40 mg (has no administration in time range)  hydrALAZINE (APRESOLINE) injection 5 mg (has no administration in time range)  zolpidem (AMBIEN) tablet 5 mg (has no administration in time range)  sodium chloride 0.9 % bolus 1,000 mL (0 mLs Intravenous Stopped 09/14/17 0332)  ondansetron (ZOFRAN) injection 4 mg (4 mg Intravenous Given 09/14/17 0103)  iopamidol (ISOVUE-300) 61 % injection 50 mL (50 mLs Intravenous Contrast Given 09/14/17 0136)  potassium chloride 20 MEQ/15ML (10%) solution 40 mEq (40 mEq Oral Given 09/14/17 0453)     Initial Impression / Assessment and Plan / ED Course  I have reviewed the triage vital signs and the nursing notes.  Pertinent labs & imaging results that were available during my care of the patient were reviewed by me and considered in my medical decision making (see chart for details).     She presents to the ED with complaints of nausea, vomiting, abdominal distention without having a bowel movement or passing gas for the past 1-2 weeks.   Patient's vital signs are reassuring.  On exam patient is overall well-appearing and nontoxic.  Patient does have abdominal distention but no focal abdominal tenderness.  Bowel sounds are hyperactive.  Heart regular rate and rhythm.  Lungs clear to auscultation bilaterally.  Neurovascularly intact in all extremities.  Patient does have dry mucous membranes.  Lab work showed a leukocytosis of 12,000.  Normal lipase.  Lactic acid was 1.97.  Hypokalemia 3.0.  UA shows no signs of infection.  Creatinine was normal.  Elevated BUN with mild hyponatremia.  Initial KUB shows signs of possible ileus.  CT scan was obtained that shows slight wall thickening along the sigmoid colon with gradual compression which may reflect acute infectious or inflammatory process with  resultant colonic dysmotility.  No signs of acute obstruction.  Given patient's signs of dehydration with decreased p.o. intake, abdominal distention, nausea feel the patient would benefit from IV fluids bowel rest and admission for further workup as indicated.  I spoke with Dr. Blaine Hamper with hospital medicine who agrees to admission will see patient in the ED and place admission orders.  Patient remains hemodynamically stable this time and was updated on plan of care.  Pt seen and eval by my attending who is  agreeable with the above plan.   Final Clinical Impressions(s) / ED Diagnoses   Final diagnoses:  Dehydration  Hypokalemia    ED Discharge Orders    None       Aaron Edelman 09/14/17 0719    Orpah Greek, MD 09/14/17 [phone]

## 2017-09-15 ENCOUNTER — Inpatient Hospital Stay (HOSPITAL_COMMUNITY): Payer: Self-pay

## 2017-09-15 DIAGNOSIS — E876 Hypokalemia: Secondary | ICD-10-CM

## 2017-09-15 LAB — CBC WITH DIFFERENTIAL/PLATELET
Basophils Absolute: 0 10*3/uL (ref 0.0–0.1)
Basophils Relative: 0 %
Eosinophils Absolute: 0.1 10*3/uL (ref 0.0–0.7)
Eosinophils Relative: 1 %
HCT: 33.2 % — ABNORMAL LOW (ref 39.0–52.0)
Hemoglobin: 10.4 g/dL — ABNORMAL LOW (ref 13.0–17.0)
Lymphocytes Relative: 18 %
Lymphs Abs: 1.4 10*3/uL (ref 0.7–4.0)
MCH: 25.9 pg — ABNORMAL LOW (ref 26.0–34.0)
MCHC: 31.3 g/dL (ref 30.0–36.0)
MCV: 82.8 fL (ref 78.0–100.0)
Monocytes Absolute: 0.8 10*3/uL (ref 0.1–1.0)
Monocytes Relative: 11 %
Neutro Abs: 5.2 10*3/uL (ref 1.7–7.7)
Neutrophils Relative %: 70 %
Platelets: 224 10*3/uL (ref 150–400)
RBC: 4.01 MIL/uL — ABNORMAL LOW (ref 4.22–5.81)
RDW: 14.9 % (ref 11.5–15.5)
WBC: 7.4 10*3/uL (ref 4.0–10.5)

## 2017-09-15 LAB — RENAL FUNCTION PANEL
Albumin: 3.1 g/dL — ABNORMAL LOW (ref 3.5–5.0)
Anion gap: 9 (ref 5–15)
BUN: 14 mg/dL (ref 6–20)
CO2: 25 mmol/L (ref 22–32)
Calcium: 8.3 mg/dL — ABNORMAL LOW (ref 8.9–10.3)
Chloride: 102 mmol/L (ref 101–111)
Creatinine, Ser: 0.7 mg/dL (ref 0.61–1.24)
GFR calc Af Amer: >60 mL/min (ref >60)
GFR calc non Af Amer: >60 mL/min (ref >60)
Glucose, Bld: 107 mg/dL — ABNORMAL HIGH (ref 65–99)
Phosphorus: 2.2 mg/dL — ABNORMAL LOW (ref 2.5–4.6)
Potassium: 3.1 mmol/L — ABNORMAL LOW (ref 3.5–5.1)
Sodium: 136 mmol/L (ref 135–145)

## 2017-09-15 LAB — MAGNESIUM: Magnesium: 1.8 mg/dL (ref 1.7–2.4)

## 2017-09-15 MED ORDER — BISACODYL 10 MG RE SUPP
10.0000 mg | Freq: Once | RECTAL | Status: AC
  Administered 2017-09-15: 10 mg via RECTAL
  Filled 2017-09-15: qty 1

## 2017-09-15 MED ORDER — KCL IN DEXTROSE-NACL 20-5-0.9 MEQ/L-%-% IV SOLN
INTRAVENOUS | Status: DC
  Administered 2017-09-15 (×2): via INTRAVENOUS
  Filled 2017-09-15 (×3): qty 1000

## 2017-09-15 MED ORDER — SODIUM CHLORIDE 0.45 % IV SOLN
INTRAVENOUS | Status: DC
  Administered 2017-09-15 – 2017-09-18 (×6): via INTRAVENOUS
  Filled 2017-09-15 (×13): qty 1000

## 2017-09-15 MED ORDER — FAMOTIDINE IN NACL 20-0.9 MG/50ML-% IV SOLN
20.0000 mg | Freq: Two times a day (BID) | INTRAVENOUS | Status: DC
  Administered 2017-09-15 – 2017-09-24 (×17): 20 mg via INTRAVENOUS
  Filled 2017-09-15 (×14): qty 50

## 2017-09-15 MED ORDER — POTASSIUM CHLORIDE 10 MEQ/100ML IV SOLN
10.0000 meq | INTRAVENOUS | Status: AC
  Administered 2017-09-15 – 2017-09-16 (×4): 10 meq via INTRAVENOUS
  Filled 2017-09-15 (×4): qty 100

## 2017-09-15 NOTE — Progress Notes (Signed)
PROGRESS NOTE    Alex [name]  [id number]:[id number] DOB: 31-Oct-1950 DOA: 09/13/2017 PCP: Antonietta Jewel, MD  Outpatient Specialists:   Brief Narrative: Patient is a 67 year old Caucasian male with past medical history significant for hypertension, hyperlipidemia, stroke, carotid artery stenosis, who was recently in the hospital for pneumonia and empyema and underwent surgical management. He presented with nausea, vomiting, abdominal distention as well as hypokalemia.  Abdominal CT scan and KUB noted.  According to the patient, he has not had bowel movement in several days. Also reported abdominal distention.  Patient has been admitted for further assessment and management.  Assessment & Plan:   Principal Problem:   Nausea & vomiting Active Problems:   History of stroke   Empyema (HCC)   Benign essential HTN   Abdominal distension   Dehydration   Hypokalemia   HLD (hyperlipidemia)   Nausea and vomiting   Nausea & vomiting and abdominal distention:  -Likely secondary to ileus -Keep patient n.p.o. -continue supportive measures, hydration, ambulation, correction of electrolytes -he is not vomiting at this time and does not feel that distention is worsening. Will hold off on NG tube for now -if he does begin vomiting, would place NG tube -will give suppository  History of stroke: -asa and zocor  HLD: -Zocor  Hypokalemia:  Potassium is being replaced. Mag normal  HTN: blood pressure stable -hold HCTZ  -IV hydralazine prn  Recent hx of Empyema [facility]): s/p of decortication. No chest pain, cough, shortness rest, fever. -Continue Zyvox   DVT ppx:  SQ Lovenox Code Status: Full code Family Communication: discussed with daughter at the bedside     Disposition Plan:   discharge home once abdominal issues have resolved  Consultants:     Procedures:   None  Antimicrobials:   Zyvox (continued from outpatient)   Subjective: No vomiting. No abdominal pain. Abdomen  feels less distended than yesterday, no bowel movements, no flatus  Objective: Vitals:   09/14/17 1504 09/14/17 2141 09/15/17 0525 09/15/17 1528  BP: 123/66 131/76 125/75 130/81  Pulse: 79 70 74 73  Resp: 18 17 17    Temp: 98.5 F (36.9 C) 98 F (36.7 C) (!) 97.5 F (36.4 C) 97.6 F (36.4 C)  TempSrc: Oral Oral Oral Oral  SpO2: 97% 98% 98% 99%  Weight: 75.9 kg (167 lb 5.3 oz)     Height: 5\' 6"  (1.676 m)       Intake/Output Summary (Last 24 hours) at 09/15/2017 1959 Last data filed at 09/15/2017 1400 Gross per 24 hour  Intake 298.33 ml  Output 1200 ml  Net -901.67 ml   Filed Weights   09/14/17 0400 09/14/17 1504  Weight: 78.5 kg (173 lb 1 oz) 75.9 kg (167 lb 5.3 oz)    Examination:  General exam: Alert, awake, oriented x 3 Respiratory system: Clear to auscultation. Respiratory effort normal. Cardiovascular system:RRR. No murmurs, rubs, gallops. Gastrointestinal system: Abdomen is distended, soft and nontender. No organomegaly or masses felt. Normal bowel sounds heard. Central nervous system: Alert and oriented. No focal neurological deficits. Extremities: No C/C/E, +pedal pulses Skin: No rashes, lesions or ulcers Psychiatry: Judgement and insight appear normal. Mood & affect appropriate.     Data Reviewed: I have personally reviewed following labs and imaging studies  CBC: Recent Labs  Lab 09/13/17 2127 09/14/17 0418 09/15/17 0627  WBC 12.2* 11.3* 7.4  NEUTROABS  --   --  5.2  HGB 13.5 11.7* 10.4*  HCT 40.3 36.1* 33.2*  MCV 81.4 81.1 82.8  PLT 427* 318 448   Basic Metabolic Panel: Recent Labs  Lab 09/13/17 2127 09/14/17 0418 09/15/17 0627  NA 133* 133* 136  K 3.0* 2.8* 3.1*  CL 91* 94* 102  CO2 25 26 25   GLUCOSE 134* 113* 107*  BUN 26* 24* 14  CREATININE 0.89 0.70 0.70  CALCIUM 9.9 8.7* 8.3*  MG  --  2.2 1.8  PHOS  --   --  2.2*   GFR: Estimated Creatinine Clearance: 82 mL/min (by C-G formula based on SCr of 0.7 mg/dL). Liver Function  Tests: Recent Labs  Lab 09/13/17 2127 09/15/17 0627  AST 44*  --   ALT 48  --   ALKPHOS 73  --   BILITOT 1.1  --   PROT 8.4*  --   ALBUMIN 4.2 3.1*   Recent Labs  Lab 09/13/17 2127  LIPASE 37   No results for input(s): AMMONIA in the last 168 hours. Coagulation Profile: No results for input(s): INR, PROTIME in the last 168 hours. Cardiac Enzymes: No results for input(s): CKTOTAL, CKMB, CKMBINDEX, TROPONINI in the last 168 hours. BNP (last 3 results) No results for input(s): PROBNP in the last 8760 hours. HbA1C: No results for input(s): HGBA1C in the last 72 hours. CBG: No results for input(s): GLUCAP in the last 168 hours. Lipid Profile: No results for input(s): CHOL, HDL, LDLCALC, TRIG, CHOLHDL, LDLDIRECT in the last 72 hours. Thyroid Function Tests: No results for input(s): TSH, T4TOTAL, FREET4, T3FREE, THYROIDAB in the last 72 hours. Anemia Panel: No results for input(s): VITAMINB12, FOLATE, FERRITIN, TIBC, IRON, RETICCTPCT in the last 72 hours. Urine analysis:    Component Value Date/Time   COLORURINE YELLOW 09/14/2017 0211   APPEARANCEUR CLEAR 09/14/2017 0211   LABSPEC 1.039 (H) 09/14/2017 0211   PHURINE 5.0 09/14/2017 0211   GLUCOSEU NEGATIVE 09/14/2017 0211   HGBUR NEGATIVE 09/14/2017 0211   BILIRUBINUR NEGATIVE 09/14/2017 0211   KETONESUR 20 (A) 09/14/2017 0211   PROTEINUR 30 (A) 09/14/2017 0211   UROBILINOGEN 1.0 02/15/2011 0431   NITRITE NEGATIVE 09/14/2017 0211   LEUKOCYTESUR NEGATIVE 09/14/2017 0211   Sepsis Labs: @LABRCNTIP (procalcitonin:4,lacticidven:4)  )No results found for this or any previous visit (from the past 240 hour(s)).       Radiology Studies: Dg Abd 1 View  Result Date: 09/15/2017 CLINICAL DATA:  Ileus; pt reports abdominal distension and no BM since 08/30/17 EXAM: ABDOMEN - 1 VIEW COMPARISON:  09/13/2017 FINDINGS: Multiple gas distended nondilated small bowel loops in the right mid abdomen . There is gaseous distention of the  proximal colon through the transverse segment, decompressed distally. Bilateral pelvic phleboliths. Regional bones unremarkable scratch the mild degenerative changes in bilateral hips. IMPRESSION: Mild gaseous distention of small bowel and proximal colon, favor adynamic ileus, slightly worse since prior exam. Electronically Signed   By: Lucrezia Europe M.D.   On: 09/15/2017 09:40   Dg Abdomen 1 View  Result Date: 09/13/2017 CLINICAL DATA:  Constipation, anorexia, abdominal distension and vomiting. EXAM: ABDOMEN - 1 VIEW COMPARISON:  None. FINDINGS: Gas-filled large bowel measuring to 6.7 cm. Gas-filled small bowel measuring to 3.2 cm. No though significant retained large bowel stool by routine radiography. No intra-abdominal mass effect or pathologic calcifications. Probable phleboliths project in RIGHT pelvis. Soft tissue planes and included osseous structures are nonsuspicious. IMPRESSION: Mild gas distended small and large bowel most compatible with mild ileus. Electronically Signed   By: Elon Alas M.D.   On: 09/13/2017 22:40   Ct Abdomen Pelvis W Contrast  Result Date: 09/14/2017 CLINICAL DATA:  No bowel movements for past 2 weeks. Generalized abdominal distention and vomiting. EXAM: CT ABDOMEN AND PELVIS WITH CONTRAST TECHNIQUE: Multidetector CT imaging of the abdomen and pelvis was performed using the standard protocol following bolus administration of intravenous contrast. CONTRAST:  100 mL ISOVUE-300 IOPAMIDOL (ISOVUE-300) INJECTION 61% COMPARISON:  Abdominal radiograph performed 09/13/2016 FINDINGS: Lower chest: There appears to be a small empyema at the right lung base, with diffusely thickened rind. Underlying atelectasis is noted. The visualized portions of the mediastinum are unremarkable. Hepatobiliary: The liver is unremarkable in appearance. The gallbladder is unremarkable in appearance. The common bile duct remains normal in caliber. Pancreas: The pancreas is within normal limits. Spleen:  The spleen is unremarkable in appearance. Adrenals/Urinary Tract: The adrenal glands are unremarkable in appearance. Mild nonspecific perinephric stranding is noted bilaterally. There is no evidence of hydronephrosis. No renal or ureteral stones are identified. Stomach/Bowel: The stomach is unremarkable in appearance. The small bowel is within normal limits. The appendix is normal in caliber, without evidence of appendicitis. There is slight wall thickening along the sigmoid colon, with gradual decompression. This may reflect an acute infectious or inflammatory process, with resultant dysmotility. The remainder of the colon is diffusely filled with fluid and air. This would typically correspond to diarrhea; no stool is seen. Vascular/Lymphatic: Scattered calcification is seen along the abdominal aorta and its branches. The abdominal aorta is otherwise grossly unremarkable. The inferior vena cava is grossly unremarkable. No retroperitoneal lymphadenopathy is seen. No pelvic sidewall lymphadenopathy is identified. Reproductive: The bladder is mildly distended and grossly unremarkable. The prostate is borderline normal in size. Other: No additional soft tissue abnormalities are seen. Musculoskeletal: No acute osseous abnormalities are identified. The visualized musculature is unremarkable in appearance. IMPRESSION: 1. Slight wall thickening along the sigmoid colon, with gradual decompression. This may reflect an acute infectious or inflammatory process, with resultant colonic dysmotility. The remainder of the colon is diffusely filled with fluid and air. This would typically correspond to diarrhea; no stool is seen to suggest constipation. 2. Apparent small empyema at the right lung base, with diffusely thickened rind. Would correlate for any lung symptoms. Underlying atelectasis noted. Aortic Atherosclerosis (ICD10-I70.0). Electronically Signed   By: Garald Balding M.D.   On: 09/14/2017 02:15        Scheduled  Meds: . aspirin EC  81 mg Oral Daily  . clopidogrel  75 mg Oral Daily  . enoxaparin (LOVENOX) injection  40 mg Subcutaneous Q24H  . linezolid  600 mg Oral BID  . simvastatin  20 mg Oral QHS   Continuous Infusions: . dextrose 5 % and 0.9 % NaCl with KCl 20 mEq/L 100 mL/hr at 09/15/17 1043  . famotidine (PEPCID) IV Stopped (09/15/17 1922)     LOS: 1 day    Time spent: 11mins.    Kathie Dike, MD  [facility] Pager #: [phone] 7PM-7AM contact night coverage as above

## 2017-09-16 ENCOUNTER — Inpatient Hospital Stay (HOSPITAL_COMMUNITY): Payer: Self-pay

## 2017-09-16 LAB — RENAL FUNCTION PANEL
Albumin: 3.2 g/dL — ABNORMAL LOW (ref 3.5–5.0)
Anion gap: 8 (ref 5–15)
BUN: 9 mg/dL (ref 6–20)
CO2: 25 mmol/L (ref 22–32)
Calcium: 8.7 mg/dL — ABNORMAL LOW (ref 8.9–10.3)
Chloride: 103 mmol/L (ref 101–111)
Creatinine, Ser: 0.71 mg/dL (ref 0.61–1.24)
GFR calc Af Amer: >60 mL/min (ref >60)
GFR calc non Af Amer: >60 mL/min (ref >60)
Glucose, Bld: 104 mg/dL — ABNORMAL HIGH (ref 65–99)
Phosphorus: 2.5 mg/dL (ref 2.5–4.6)
Potassium: 3.7 mmol/L (ref 3.5–5.1)
Sodium: 136 mmol/L (ref 135–145)

## 2017-09-16 LAB — BASIC METABOLIC PANEL
ANION GAP: 7 (ref 5–15)
BUN: 9 mg/dL (ref 6–20)
CO2: 25 mmol/L (ref 22–32)
Calcium: 8.7 mg/dL — ABNORMAL LOW (ref 8.9–10.3)
Chloride: 105 mmol/L (ref 101–111)
Creatinine, Ser: 0.69 mg/dL (ref 0.61–1.24)
GFR calc Af Amer: >60 mL/min (ref >60)
Glucose, Bld: 106 mg/dL — ABNORMAL HIGH (ref 65–99)
POTASSIUM: 3.8 mmol/L (ref 3.5–5.1)
Sodium: 137 mmol/L (ref 135–145)

## 2017-09-16 MED ORDER — WHITE PETROLATUM EX OINT
TOPICAL_OINTMENT | CUTANEOUS | Status: AC
  Filled 2017-09-16: qty 28.35

## 2017-09-16 MED ORDER — ORAL CARE MOUTH RINSE
15.0000 mL | Freq: Two times a day (BID) | OROMUCOSAL | Status: DC
  Administered 2017-09-16 – 2017-09-25 (×13): 15 mL via OROMUCOSAL

## 2017-09-16 MED ORDER — PROMETHAZINE HCL 25 MG/ML IJ SOLN
12.5000 mg | INTRAMUSCULAR | Status: AC
  Administered 2017-09-16: 12.5 mg via INTRAVENOUS
  Filled 2017-09-16: qty 1

## 2017-09-16 MED ORDER — MORPHINE SULFATE (PF) 4 MG/ML IV SOLN
2.0000 mg | INTRAVENOUS | Status: DC | PRN
  Administered 2017-09-16 – 2017-09-19 (×4): 2 mg via INTRAVENOUS
  Filled 2017-09-16 (×4): qty 1

## 2017-09-16 NOTE — Progress Notes (Signed)
PROGRESS NOTE    MERVIL WACKER  MWU:[id number] DOB: [birth year] DOA: 09/13/2017 PCP: Antonietta Jewel, MD  Outpatient Specialists:   Brief Narrative: Patient is a 67 year old Caucasian male with past medical history significant for hypertension, hyperlipidemia, stroke, carotid artery stenosis, who was recently in the hospital for pneumonia and empyema and underwent surgical management. He presented with nausea, vomiting, abdominal distention as well as hypokalemia.  Abdominal imaging is indicated possible ileus, although with this worsening abdominal distention, vomiting and lack of bowel movement/flatus, there is concern that he is progressing to a bowel obstruction.  NG tube placed.  General surgery consulted.  Assessment & Plan:   Principal Problem:   Nausea & vomiting Active Problems:   History of stroke   Empyema (HCC)   Benign essential HTN   Abdominal distension   Dehydration   Hypokalemia   HLD (hyperlipidemia)   Nausea and vomiting   Nausea & vomiting and abdominal distention:  -Imaging indicates possible ileus, although with his lack of bowel movements, persistent abdominal distention and vomiting, I am concerned he may be progressing to a bowel obstruction.  He is received laxatives by mouth without significant effect and now is vomiting.  He also received suppository without any significant benefit.  NG tube will be placed since he is having vomiting.  General surgery has been consulted.  History of stroke: -asa and zocor  HLD: -Zocor  Hypokalemia:  Potassium is being replaced. Mag normal  HTN: blood pressure stable -hold HCTZ  -IV hydralazine prn  Recent hx of Empyema [facility]): s/p of decortication. No chest pain, cough, shortness rest, fever. -Patient was started on linezolid during his last admission and plans are to continue antibiotic therapy until 4/23   DVT ppx:  SQ Lovenox Code Status: Full code Family Communication: No family present Disposition  Plan:   discharge home once abdominal issues have resolved  Consultants:  General surgery  Procedures:   None  Antimicrobials:   Zyvox (continued from outpatient) until 4/23   Subjective: Patient had some vomiting this morning.  Denies abdominal pain.  No bowel movement or flatus  Objective: Vitals:   09/15/17 1528 09/15/17 2029 09/16/17 0542 09/16/17 1421  BP: 130/81 135/76 139/89 129/78  Pulse: 73 73 74 67  Resp:  18 18 20   Temp: 97.6 F (36.4 C) 97.6 F (36.4 C) 97.7 F (36.5 C) 97.7 F (36.5 C)  TempSrc: Oral Oral Oral Oral  SpO2: 99% 99% 98% 100%  Weight:      Height:        Intake/Output Summary (Last 24 hours) at 09/16/2017 1639 Last data filed at 09/16/2017 1300 Gross per 24 hour  Intake 767.5 ml  Output 200 ml  Net 567.5 ml   Filed Weights   09/14/17 0400 09/14/17 1504  Weight: 78.5 kg (173 lb 1 oz) 75.9 kg (167 lb 5.3 oz)    Examination:  General exam: Alert, awake, oriented x 3 Respiratory system: Clear to auscultation. Respiratory effort normal. Cardiovascular system:RRR. No murmurs, rubs, gallops. Gastrointestinal system: Abdomen is distended, firm and nontender. No organomegaly or masses felt. Normal bowel sounds heard. Central nervous system: Alert and oriented. No focal neurological deficits. Extremities: No C/C/E, +pedal pulses Skin: No rashes, lesions or ulcers Psychiatry: Judgement and insight appear normal. Mood & affect appropriate.    Data Reviewed: I have personally reviewed following labs and imaging studies  CBC: Recent Labs  Lab 09/13/17 2127 09/14/17 0418 09/15/17 0627  WBC 12.2* 11.3* 7.4  NEUTROABS  --   --  5.2  HGB 13.5 11.7* 10.4*  HCT 40.3 36.1* 33.2*  MCV 81.4 81.1 82.8  PLT 427* 318 811   Basic Metabolic Panel: Recent Labs  Lab 09/13/17 2127 09/14/17 0418 09/15/17 0627 09/16/17 0922  NA 133* 133* 136 137  136  K 3.0* 2.8* 3.1* 3.8  3.7  CL 91* 94* 102 105  103  CO2 25 26 25 25  25   GLUCOSE 134*  113* 107* 106*  104*  BUN 26* 24* 14 9  9   CREATININE 0.89 0.70 0.70 0.69  0.71  CALCIUM 9.9 8.7* 8.3* 8.7*  8.7*  MG  --  2.2 1.8  --   PHOS  --   --  2.2* 2.5   GFR: Estimated Creatinine Clearance: 82 mL/min (by C-G formula based on SCr of 0.71 mg/dL). Liver Function Tests: Recent Labs  Lab 09/13/17 2127 09/15/17 0627 09/16/17 0922  AST 44*  --   --   ALT 48  --   --   ALKPHOS 73  --   --   BILITOT 1.1  --   --   PROT 8.4*  --   --   ALBUMIN 4.2 3.1* 3.2*   Recent Labs  Lab 09/13/17 2127  LIPASE 37   No results for input(s): AMMONIA in the last 168 hours. Coagulation Profile: No results for input(s): INR, PROTIME in the last 168 hours. Cardiac Enzymes: No results for input(s): CKTOTAL, CKMB, CKMBINDEX, TROPONINI in the last 168 hours. BNP (last 3 results) No results for input(s): PROBNP in the last 8760 hours. HbA1C: No results for input(s): HGBA1C in the last 72 hours. CBG: No results for input(s): GLUCAP in the last 168 hours. Lipid Profile: No results for input(s): CHOL, HDL, LDLCALC, TRIG, CHOLHDL, LDLDIRECT in the last 72 hours. Thyroid Function Tests: No results for input(s): TSH, T4TOTAL, FREET4, T3FREE, THYROIDAB in the last 72 hours. Anemia Panel: No results for input(s): VITAMINB12, FOLATE, FERRITIN, TIBC, IRON, RETICCTPCT in the last 72 hours. Urine analysis:    Component Value Date/Time   COLORURINE YELLOW 09/14/2017 0211   APPEARANCEUR CLEAR 09/14/2017 0211   LABSPEC 1.039 (H) 09/14/2017 0211   PHURINE 5.0 09/14/2017 0211   GLUCOSEU NEGATIVE 09/14/2017 0211   HGBUR NEGATIVE 09/14/2017 0211   BILIRUBINUR NEGATIVE 09/14/2017 0211   KETONESUR 20 (A) 09/14/2017 0211   PROTEINUR 30 (A) 09/14/2017 0211   UROBILINOGEN 1.0 02/15/2011 0431   NITRITE NEGATIVE 09/14/2017 0211   LEUKOCYTESUR NEGATIVE 09/14/2017 0211   Sepsis Labs: @LABRCNTIP (procalcitonin:4,lacticidven:4)  )No results found for this or any previous visit (from the past 240  hour(s)).       Radiology Studies: Dg Abd 1 View  Result Date: 09/15/2017 CLINICAL DATA:  Ileus; pt reports abdominal distension and no BM since 08/30/17 EXAM: ABDOMEN - 1 VIEW COMPARISON:  09/13/2017 FINDINGS: Multiple gas distended nondilated small bowel loops in the right mid abdomen . There is gaseous distention of the proximal colon through the transverse segment, decompressed distally. Bilateral pelvic phleboliths. Regional bones unremarkable scratch the mild degenerative changes in bilateral hips. IMPRESSION: Mild gaseous distention of small bowel and proximal colon, favor adynamic ileus, slightly worse since prior exam. Electronically Signed   By: Lucrezia Europe M.D.   On: 09/15/2017 09:40   Dg Abd Portable 1v  Result Date: 09/16/2017 CLINICAL DATA:  Nasogastric tube placement. EXAM: PORTABLE ABDOMEN - 1 VIEW COMPARISON:  Yesterday. FINDINGS: Mildly prominent gas-filled loops of colon and small bowel with an interval decrease in caliber. Interval nasogastric tube  with its tip in the proximal stomach and side hole at or just below the gastroesophageal junction. Mild lumbar and lower thoracic spine degenerative changes. IMPRESSION: 1. Nasogastric tube tip in the proximal stomach and side hole at or just below the gastroesophageal junction. 2. Improving pattern of colonic and small bowel ileus. Electronically Signed   By: Claudie Revering M.D.   On: 09/16/2017 12:38        Scheduled Meds: . enoxaparin (LOVENOX) injection  40 mg Subcutaneous Q24H  . linezolid  600 mg Oral BID   Continuous Infusions: . famotidine (PEPCID) IV Stopped (09/16/17 1214)  . sodium chloride 0.45 % 1,000 mL with potassium chloride 40 mEq infusion 75 mL/hr at 09/16/17 1322     LOS: 2 days    Time spent: 93mins.    Kathie Dike, MD  [facility] Pager #: [phone] 7PM-7AM contact night coverage as above

## 2017-09-16 NOTE — Progress Notes (Signed)
Spoke with Bodenheimer NP in regards to Zyvox PO and patient NPO status. Was told to hold the Zyvox

## 2017-09-17 ENCOUNTER — Inpatient Hospital Stay (HOSPITAL_COMMUNITY): Payer: Self-pay

## 2017-09-17 DIAGNOSIS — K56609 Unspecified intestinal obstruction, unspecified as to partial versus complete obstruction: Secondary | ICD-10-CM

## 2017-09-17 DIAGNOSIS — R933 Abnormal findings on diagnostic imaging of other parts of digestive tract: Secondary | ICD-10-CM

## 2017-09-17 DIAGNOSIS — D649 Anemia, unspecified: Secondary | ICD-10-CM

## 2017-09-17 LAB — RENAL FUNCTION PANEL
Albumin: 3.3 g/dL — ABNORMAL LOW (ref 3.5–5.0)
Anion gap: 9 (ref 5–15)
BUN: 8 mg/dL (ref 6–20)
CO2: 22 mmol/L (ref 22–32)
Calcium: 8.8 mg/dL — ABNORMAL LOW (ref 8.9–10.3)
Chloride: 102 mmol/L (ref 101–111)
Creatinine, Ser: 0.63 mg/dL (ref 0.61–1.24)
GFR calc Af Amer: >60 mL/min (ref >60)
GFR calc non Af Amer: >60 mL/min (ref >60)
Glucose, Bld: 78 mg/dL (ref 65–99)
Phosphorus: 2.9 mg/dL (ref 2.5–4.6)
Potassium: 4.1 mmol/L (ref 3.5–5.1)
Sodium: 133 mmol/L — ABNORMAL LOW (ref 135–145)

## 2017-09-17 MED ORDER — IOPAMIDOL (ISOVUE-300) INJECTION 61%
INTRAVENOUS | Status: AC
  Filled 2017-09-17: qty 450

## 2017-09-17 MED ORDER — IOPAMIDOL (ISOVUE-300) INJECTION 61%
150.0000 mL | Freq: Once | INTRAVENOUS | Status: AC | PRN
  Administered 2017-09-17: 150 mL via ORAL

## 2017-09-17 MED ORDER — LINEZOLID 600 MG/300ML IV SOLN
600.0000 mg | Freq: Two times a day (BID) | INTRAVENOUS | Status: AC
  Administered 2017-09-17 – 2017-09-18 (×4): 600 mg via INTRAVENOUS
  Filled 2017-09-17 (×4): qty 300

## 2017-09-17 NOTE — Consult Note (Addendum)
[facility] Surgery Consult Note  Alex [name] Nov 04, 1950  [id number].    Requesting MD: Daleen Bo, MD Chief Complaint/Reason for Consult: ileus vs SBO  HPI:  Alex [name] is a 67 y/o male with MMP, inculuding Hx CVA on plavix and s/p VATS/decortication 08/30/17 for pneumonia/empyema, who presented to the hospital with nausea, vomiting, and abdominal distention. Patient reports being discharged from the hospital 4/9. He went home and started eating a normal diet until around 4/14 when he began feeling distended and "icky" and stopped eating/drinking as much. States he hasn't had a BM since 4/3. He was encouraged by a nurse to come to the hospital for evaluation on 4/18. The patient denies a history of similar symptoms, denies taking narcotics at home, and has no history of abdominal surgeries, hernias, or cancer. The patient has never had a colonoscopy. No FHx colon cancer. Prior to his cardiothoracic surgery he was having 2, soft, formed stools daily. He is a never smoker and reports he quick drinking EtOH 25 y ago. NKDA.    ED workup: CT Abd/Pelvis performed in ED showed "Slight wall thickening along the sigmoid colon, with gradual decompression...the remainder of the colon is diffusely filled with fluid and air. no stool is seen to sugget constipation".  WBC 12,200, hgb/hct WNL, hypokalemia 3.0, Mg 2.2, LFT's and lipase WNL, UA negative for UTI  He was admitted to the medical service for observation with unknown etiology of nausea/vomting. For suspected ileus he was given an NG tube, placed on bowel rest, and given enemas. Due to persistent distention and no BMs, general surgery was asked to consult to r/o SBO.  ROS: Review of Systems  Constitutional: Positive for malaise/fatigue. Negative for chills and fever.  Respiratory: Negative.   Gastrointestinal: Positive for constipation, nausea and vomiting. Negative for abdominal pain, blood in stool, diarrhea and melena.  Genitourinary:  Negative.   All other systems reviewed and are negative.   Family History  Problem Relation Age of Onset  . Heart disease Other   . Hypertension Father   . Stroke Father   . Heart disease Father   . Diabetes Sister   . Diabetes Brother     Past Medical History:  Diagnosis Date  . Aortic atherosclerosis ([place]) 08/27/2017  . Atherosclerosis of arteries   . Carotid artery occlusion   . Empyema ([place]) 08/27/2017  . Hypertension   . Pulmonary nodule, right 08/27/2017  . Stroke [facility])     Past Surgical History:  Procedure Laterality Date  . DECORTICATION Right 08/30/2017   Procedure: DECORTICATION of right lower lung lobe;  Surgeon: Prescott Gum, Collier Salina, MD;  Location: [place];  Service: Thoracic;  Laterality: Right;  . FOOT SURGERY    . VIDEO ASSISTED THORACOSCOPY (VATS)/EMPYEMA Right 08/30/2017   Procedure: VIDEO ASSISTED THORACOSCOPY (VATS)/EMPYEMA   ;  Surgeon: Ivin Poot, MD;  Location: [place];  Service: Thoracic;  Laterality: Right;    Social History:  reports that he has never smoked. He has never used smokeless tobacco. He reports that he does not drink alcohol or use drugs.  Allergies: No Known Allergies  Medications Prior to Admission  Medication Sig Dispense Refill  . acetaminophen (TYLENOL) 500 MG tablet Take 2 tablets (1,000 mg total) by mouth every 6 (six) hours as needed. 30 tablet 0  . aspirin 81 MG EC tablet Take 81 mg by mouth daily.     . clopidogrel (PLAVIX) 75 MG tablet Take 75 mg by mouth daily.    Marland Kitchen  hydrochlorothiazide (HYDRODIURIL) 25 MG tablet Take 25 mg by mouth daily.    Marland Kitchen linezolid (ZYVOX) 600 MG tablet Take 1 tablet (600 mg total) by mouth 2 (two) times daily for 14 days. 28 tablet 0  . simvastatin (ZOCOR) 10 MG tablet Take 20 mg by mouth at bedtime.     Marland Kitchen oxyCODONE (OXY IR/ROXICODONE) 5 MG immediate release tablet Take 1 tablet (5 mg total) by mouth every 4 (four) hours as needed for severe pain. (Patient not taking: Reported on 09/14/2017) 30 tablet 0     Blood pressure 137/86, pulse 79, temperature (!) 97.5 F (36.4 C), temperature source Oral, resp. rate 18, height '5\' 6"'  (1.676 m), weight 75.9 kg (167 lb 5.3 oz), SpO2 99 %. Physical Exam: Physical Exam  Constitutional: He is oriented to person, place, and time. He appears well-developed and well-nourished. No distress.  HENT:  Head: Normocephalic and atraumatic.  Right Ear: External ear normal.  Left Ear: External ear normal.  Eyes: Pupils are equal, round, and reactive to light. EOM are normal. Right eye exhibits no discharge. Left eye exhibits no discharge.  Neck: Normal range of motion. No tracheal deviation present. No thyromegaly present.  Cardiovascular: Normal rate, regular rhythm, normal heart sounds and intact distal pulses.  No murmur heard. Pulmonary/Chest: Effort normal. No stridor. No respiratory distress. He has no wheezes. He has no rales.  Diminished breath sounds right lung base  Abdominal: Soft. Bowel sounds are normal. He exhibits distension. He exhibits no mass. There is tenderness. There is no rebound and no guarding. A hernia (small reducible umbilical hernia) is present.  Musculoskeletal: Normal range of motion. He exhibits no edema or deformity.  Neurological: He is alert and oriented to person, place, and time. No sensory deficit. He exhibits normal muscle tone.  Skin: Skin is warm and dry. No rash noted. He is not diaphoretic. No erythema.  Psychiatric: He has a normal mood and affect. His behavior is normal.    Results for orders placed or performed during the hospital encounter of 09/13/17 (from the past 48 hour(s))  Renal function panel     Status: Abnormal   Collection Time: 09/16/17  9:22 AM  Result Value Ref Range   Sodium 136 135 - 145 mmol/L   Potassium 3.7 3.5 - 5.1 mmol/L   Chloride 103 101 - 111 mmol/L   CO2 25 22 - 32 mmol/L   Glucose, Bld 104 (H) 65 - 99 mg/dL   BUN 9 6 - 20 mg/dL   Creatinine, Ser 0.71 0.61 - 1.24 mg/dL   Calcium 8.7 (L)  8.9 - 10.3 mg/dL   Phosphorus 2.5 2.5 - 4.6 mg/dL   Albumin 3.2 (L) 3.5 - 5.0 g/dL   GFR calc non Af Amer >60 >60 mL/min   GFR calc Af Amer >60 >60 mL/min    Comment: (NOTE) The eGFR has been calculated using the CKD EPI equation. This calculation has not been validated in all clinical situations. eGFR's persistently <60 mL/min signify possible Chronic Kidney Disease.    Anion gap 8 5 - 15    Comment: Performed at [facility] [address] [place], [place] [address]  Basic metabolic panel     Status: Abnormal   Collection Time: 09/16/17  9:22 AM  Result Value Ref Range   Sodium 137 135 - 145 mmol/L   Potassium 3.8 3.5 - 5.1 mmol/L   Chloride 105 101 - 111 mmol/L   CO2 25 22 - 32 mmol/L  Glucose, Bld 106 (H) 65 - 99 mg/dL   BUN 9 6 - 20 mg/dL   Creatinine, Ser 0.69 0.61 - 1.24 mg/dL   Calcium 8.7 (L) 8.9 - 10.3 mg/dL   GFR calc non Af Amer >60 >60 mL/min   GFR calc Af Amer >60 >60 mL/min    Comment: (NOTE) The eGFR has been calculated using the CKD EPI equation. This calculation has not been validated in all clinical situations. eGFR's persistently <60 mL/min signify possible Chronic Kidney Disease.    Anion gap 7 5 - 15    Comment: Performed at [facility] [address] [place], [place] [address]  Renal function panel     Status: Abnormal   Collection Time: 09/17/17  6:10 AM  Result Value Ref Range   Sodium 133 (L) 135 - 145 mmol/L   Potassium 4.1 3.5 - 5.1 mmol/L   Chloride 102 101 - 111 mmol/L   CO2 22 22 - 32 mmol/L   Glucose, Bld 78 65 - 99 mg/dL   BUN 8 6 - 20 mg/dL   Creatinine, Ser 0.63 0.61 - 1.24 mg/dL   Calcium 8.8 (L) 8.9 - 10.3 mg/dL   Phosphorus 2.9 2.5 - 4.6 mg/dL   Albumin 3.3 (L) 3.5 - 5.0 g/dL   GFR calc non Af Amer >60 >60 mL/min   GFR calc Af Amer >60 >60 mL/min    Comment: (NOTE) The eGFR has been calculated using the CKD EPI equation. This calculation has not been validated in all clinical situations. eGFR's persistently  <60 mL/min signify possible Chronic Kidney Disease.    Anion gap 9 5 - 15    Comment: Performed at [facility] [address], [place], [place] [address]   Dg Abd 1 View  Result Date: 09/15/2017 CLINICAL DATA:  Ileus; pt reports abdominal distension and no BM since 08/30/17 EXAM: ABDOMEN - 1 VIEW COMPARISON:  09/13/2017 FINDINGS: Multiple gas distended nondilated small bowel loops in the right mid abdomen . There is gaseous distention of the proximal colon through the transverse segment, decompressed distally. Bilateral pelvic phleboliths. Regional bones unremarkable scratch the mild degenerative changes in bilateral hips. IMPRESSION: Mild gaseous distention of small bowel and proximal colon, favor adynamic ileus, slightly worse since prior exam. Electronically Signed   By: Lucrezia Europe M.D.   On: 09/15/2017 09:40   Dg Abd Portable 1v  Result Date: 09/16/2017 CLINICAL DATA:  Nasogastric tube placement. EXAM: PORTABLE ABDOMEN - 1 VIEW COMPARISON:  Yesterday. FINDINGS: Mildly prominent gas-filled loops of colon and small bowel with an interval decrease in caliber. Interval nasogastric tube with its tip in the proximal stomach and side hole at or just below the gastroesophageal junction. Mild lumbar and lower thoracic spine degenerative changes. IMPRESSION: 1. Nasogastric tube tip in the proximal stomach and side hole at or just below the gastroesophageal junction. 2. Improving pattern of colonic and small bowel ileus. Electronically Signed   By: Claudie Revering M.D.   On: 09/16/2017 12:38   Assessment/Plan Ileus vs bowel obstruction  - s/p VATS/decortication 08/30/17; no history of SBO or abdominal surgery; no Hx diverticulitis  - CT scan 4/19 shows normal stomach and SB. Colon is filled with liquid/air to the level of the sigmoid. Mild sigmoid wall thickening.  - agree w/ bowel rest and NG tube to LIWS, NGT ~600cc/24h, bilious - minimize narcotic medications and keep Mg > 2.0 and K > 4.0; I would  recommend starting TNA given prolonged NPO  status. - will order rectal contrast study to better evaluate rectum and sigmoid - r/o Ogilvie syndrome.   FEN: NPO, IVF, NGT to LIWS; consider PICC and TNA ID: Linezolid per CT surgery VTE: SCD's, Lovenox   General surgery will follow        Jill Alexanders, [facility] Surgery 09/17/2017, 7:56 AM Pager: [phone] Consults: [phone] Mon-Fri 7:00 am-4:30 pm Sat-Sun 7:00 am-11:30 am

## 2017-09-17 NOTE — H&P (View-Only) (Signed)
[facility] Consult: 2:43 PM 09/17/2017  LOS: 3 days    Referring Provider: Dr. Nedra Hai.   Primary Care Physician:  Antonietta Jewel, MD in [place] Primary Gastroenterologist:  unassigned     Reason for Consultation:  Rectosigmoid stricture.     HPI: Alex [name] is a 67 y.o. male.  PMH CVA.  Carotid artery occlusion for which he is on chronic Plavix.Marland Kitchen  Hypertension.  Coronary artery and aortic atherosclerosis.  Anemia.  Chronic dental disease.  Has never undergone colonoscopy or upper endoscopy.  08/25/2017 -09/04/2017 admission with pneumonia and right empyema/pleural effusion.  Underwent 08/31/17 right VATS minithoracotomy and drainage of empyema and decortication.  Inpt Vancomycin and Zosyn, at discharge he was prescribed 14 days of oral Zyvox; Plavix, 81 mg aspirin continued.  During the admission his hemoglobin went from 13.7 >> 9.8. Normal MCV Coags normal.    The last time the patient had a bowel movement was on 08/29/17.  Not even a smear of stool since then.  Not passing flatus.  Abdomen distended but not painful.  Never vomited at home but about 10 days ago lost his appetite and was not eating much.  He was able and continue to swallow his medications.  Hiccups started about a week ago.  He presented to the emergency department and was admitted 4/18.  NG tube was placed.  This is putting out a greenish brown material.  Initially it put out 1 L.  Since then it has been declining from 200 ml >> 50 >> 50 >> 57ml.  Today, despite presence of the NG tube he vomited a small amount of material.  KUB initially showed mild gaseous distention of the small and large bowel compatible with a mild ileus. Contrasted CT abdomen pelvis with slight sigmoid wall thickening with gradual decompression.   Remainder of the  colon diffusely filled with fluid and air. ? of acute infectious or inflammatory process with resulting colonic dysmotility?  No stool is seen to suggest constipation.  Right lung shows small empyema with thickened rind. Water contrast BE today reveals high-grade stenosis/obstruction in the distal sigmoid corresponding to the changes seen on CT.  ?  Focal stricture or scarring?.  Tumor not excluded.  Despite maneuvering the patient's position, radiologist was unable to pass the contrast beyond this area of obstruction.    Hgb 13.5>> 10.4.  Hypokalemic and hypophosphtemia, now corrected.  No renal insufficiency.   Patient denies any prodrome of abdominal pain, GI upset.  Leading up to the abrupt termination of BMs there was some loosening of his stools but he never had watery diarrhea.  Has never seen blood per rectum.  The only incidence of vomiting was today.  He is 1 of 11 children.  None of his siblings nor his parents ever had problems with colon cancer or colitis.  No NSAIDs.          Past Medical History:  Diagnosis Date  . Aortic atherosclerosis ([place]) 08/27/2017  . Atherosclerosis of arteries   . Carotid artery occlusion   . Empyema (  [place]) 08/27/2017  . Hypertension   . Pulmonary nodule, right 08/27/2017  . Stroke [facility])     Past Surgical History:  Procedure Laterality Date  . DECORTICATION Right 08/30/2017   Procedure: DECORTICATION of right lower lung lobe;  Surgeon: Prescott Gum, Collier Salina, MD;  Location: [place];  Service: Thoracic;  Laterality: Right;  . FOOT SURGERY    . VIDEO ASSISTED THORACOSCOPY (VATS)/EMPYEMA Right 08/30/2017   Procedure: VIDEO ASSISTED THORACOSCOPY (VATS)/EMPYEMA   ;  Surgeon: Ivin Poot, MD;  Location: [place];  Service: Thoracic;  Laterality: Right;    Prior to Admission medications   Medication Sig Start Date End Date Taking? Authorizing Provider  acetaminophen (TYLENOL) 500 MG tablet Take 2 tablets (1,000 mg total) by mouth every 6 (six) hours as needed. 09/04/17   Yes Barrett, Erin R, PA-C  aspirin 81 MG EC tablet Take 81 mg by mouth daily.    Yes [provider]  clopidogrel (PLAVIX) 75 MG tablet Take 75 mg by mouth daily.   Yes [provider]  hydrochlorothiazide (HYDRODIURIL) 25 MG tablet Take 25 mg by mouth daily.   Yes [provider]  linezolid (ZYVOX) 600 MG tablet Take 1 tablet (600 mg total) by mouth 2 (two) times daily for 14 days. 09/04/17 09/18/17 Yes Barrett, Erin R, PA-C  simvastatin (ZOCOR) 10 MG tablet Take 20 mg by mouth at bedtime.    Yes [provider]  oxyCODONE (OXY IR/ROXICODONE) 5 MG immediate release tablet Take 1 tablet (5 mg total) by mouth every 4 (four) hours as needed for severe pain. Patient not taking: Reported on 09/14/2017 09/04/17   Barrett, Lodema Hong, PA-C    Scheduled Meds: . enoxaparin (LOVENOX) injection  40 mg Subcutaneous Q24H  . iopamidol      . mouth rinse  15 mL Mouth Rinse BID   Infusions: . famotidine (PEPCID) IV Stopped (09/17/17 1113)  . linezolid (ZYVOX) IV    . sodium chloride 0.45 % 1,000 mL with potassium chloride 40 mEq infusion 75 mL/hr at 09/17/17 0157   PRN Meds: acetaminophen, hydrALAZINE, morphine injection, ondansetron (ZOFRAN) IV   Allergies as of 09/13/2017  . (No Known Allergies)    Family History  Problem Relation Age of Onset  . Heart disease Other   . Hypertension Father   . Stroke Father   . Heart disease Father   . Diabetes Sister   . Diabetes Brother     Social History   Socioeconomic History  . Marital status: Single    Spouse name: Not on file  . Number of children: Not on file  . Years of education: Not on file  . Highest education level: Not on file  Occupational History  . Not on file  Social Needs  . Financial resource strain: Not on file  . Food insecurity:    Worry: Not on file    Inability: Not on file  . Transportation needs:    Medical: Not on file    Non-medical: Not on file  Tobacco Use  . Smoking status: Never  Smoker  . Smokeless tobacco: Never Used  Substance and Sexual Activity  . Alcohol use: No    Alcohol/week: 0.0 oz  . Drug use: No  . Sexual activity: Not on file  Lifestyle  . Physical activity:    Days per week: Not on file    Minutes per session: Not on file  . Stress: Not on file  Relationships  . Social connections:  Talks on phone: Not on file    Gets together: Not on file    Attends religious service: Not on file    Active member of club or organization: Not on file    Attends meetings of clubs or organizations: Not on file    Relationship status: Not on file  . Intimate partner violence:    Fear of current or ex partner: Not on file    Emotionally abused: Not on file    Physically abused: Not on file    Forced sexual activity: Not on file  Other Topics Concern  . Not on file  Social History Narrative  . Not on file    REVIEW OF SYSTEMS: Constitutional: Weakness.  No fatigue. ENT:  No nose bleeds Pulm: Occasional cough, primarily nonproductive.  Overall his pulmonary status is steadily improving. CV:  No palpitations, no LE edema.  GU:  No hematuria, no frequency GI:  Per HPI Heme: No unusual bleeding or bruising. Transfusions: None Neuro:  No headaches, no peripheral tingling or numbness Derm:  No itching, no rash or sores.  Endocrine:  No sweats or chills.  No polyuria or dysuria Immunization: Not inquire as to recent or previous vaccinations. Travel:  None beyond local counties in last few months.    PHYSICAL EXAM: Vital signs in last 24 hours: Vitals:   09/16/17 2051 09/17/17 0448  BP: 140/85 137/86  Pulse: 75 79  Resp: 16 18  Temp: 98.2 F (36.8 C) (!) 97.5 F (36.4 C)  SpO2: 99% 99%   Wt Readings from Last 3 Encounters:  09/14/17 167 lb 5.3 oz (75.9 kg)  08/26/17 173 lb (78.5 kg)  03/29/17 185 lb (83.9 kg)    General: Pleasant, looks moderately chronically ill but not acutely ill.  Comfortable. Head: No facial asymmetry or swelling.  No  signs of head trauma. Eyes: No conjunctival pallor.  No scleral icterus.  EOMI. Ears: Not hard of hearing Nose: No discharge or congestion.  NG tube is in place and effluent is brown.  Does not look particularly bloody, more like dark bile. Mouth: Moist, clear, oral mucosa.  Tongue midline.  Poor dentition. Neck: No JVD, no masses, no thyromegaly. Lungs: Clear bilaterally.  No dyspnea or cough.  Patient hiccups. Heart: RRR.  No MRG. Abdomen: Moderately tense.  Not tender.  Scant tinkling bowel sounds.  No tympanitic bowel sounds.  No HSM, masses, hernias..   Rectal: Deferred. Musc/Skeltl: No joint swelling, redness or significant deformity. Extremities: No CCE. Neurologic: Fully alert and oriented.  No limb weakness or tremor.  Moves all 4 limbs.  No gross neurologic deficits. Skin: No rash, no AVMs, no sores. Nodes: No cervical adenopathy. Psych: Affect a bit flat but pleasant, calm, cooperative.  Intake/Output from previous day: 04/21 0701 - 04/22 0700 In: 882.5 [I.V.:802.5; NG/GT:30; IV Piggyback:50] Out: 1485 [Urine:975; Emesis/NG output:510] Intake/Output this shift: Total I/O In: -  Out: 350 [Emesis/NG output:350]  LAB RESULTS: Recent Labs    09/15/17 0627  WBC 7.4  HGB 10.4*  HCT 33.2*  PLT 224   BMET Lab Results  Component Value Date   NA 133 (L) 09/17/2017   NA 136 09/16/2017   NA 137 09/16/2017   K 4.1 09/17/2017   K 3.7 09/16/2017   K 3.8 09/16/2017   CL 102 09/17/2017   CL 103 09/16/2017   CL 105 09/16/2017   CO2 22 09/17/2017   CO2 25 09/16/2017   CO2 25 09/16/2017   GLUCOSE 78 09/17/2017  GLUCOSE 104 (H) 09/16/2017   GLUCOSE 106 (H) 09/16/2017   BUN 8 09/17/2017   BUN 9 09/16/2017   BUN 9 09/16/2017   CREATININE 0.63 09/17/2017   CREATININE 0.71 09/16/2017   CREATININE 0.69 09/16/2017   CALCIUM 8.8 (L) 09/17/2017   CALCIUM 8.7 (L) 09/16/2017   CALCIUM 8.7 (L) 09/16/2017   LFT Recent Labs    09/15/17 0627 09/16/17 0922 09/17/17 0610    ALBUMIN 3.1* 3.2* 3.3*   PT/INR Lab Results  Component Value Date   INR 1.09 08/29/2017   INR 1.12 08/27/2017   INR 1.02 02/15/2011   Hepatitis Panel No results for input(s): HEPBSAG, HCVAB, HEPAIGM, HEPBIGM in the last 72 hours. C-Diff No components found for: CDIFF Lipase     Component Value Date/Time   LIPASE 37 09/13/2017 2127    Drugs of Abuse     Component Value Date/Time   LABOPIA NONE DETECTED 02/15/2011 0431   COCAINSCRNUR NONE DETECTED 02/15/2011 0431   LABBENZ NONE DETECTED 02/15/2011 0431   AMPHETMU NONE DETECTED 02/15/2011 0431   THCU POSITIVE (A) 02/15/2011 0431   LABBARB NONE DETECTED 02/15/2011 0431     RADIOLOGY STUDIES: Dg Abd Portable 1v  Result Date: 09/16/2017 CLINICAL DATA:  Nasogastric tube placement. EXAM: PORTABLE ABDOMEN - 1 VIEW COMPARISON:  Yesterday. FINDINGS: Mildly prominent gas-filled loops of colon and small bowel with an interval decrease in caliber. Interval nasogastric tube with its tip in the proximal stomach and side hole at or just below the gastroesophageal junction. Mild lumbar and lower thoracic spine degenerative changes. IMPRESSION: 1. Nasogastric tube tip in the proximal stomach and side hole at or just below the gastroesophageal junction. 2. Improving pattern of colonic and small bowel ileus. Electronically Signed   By: Claudie Revering M.D.   On: 09/16/2017 12:38   Dg Colon W/water Sol Cm  Result Date: 09/17/2017 CLINICAL DATA:  Abdominal pain. Lack of stool since surgery on August 30 2017 EXAM: COLON WITH WATER SOLUTION CONTRAST COMPARISON:  Multiple exams, including radiograph from 09/16/2017 and CT abdomen from 09/14/2017 FINDINGS: Initial KUB demonstrates borderline distention of proximal colon and mildly dilated loops of small bowel. Proximally 400 cc of Isovue mixed 3-1 with water was utilized. The rectum appears unremarkable but there is blunt abrupt truncation of the contrast column in the vicinity of the distal sigmoid colon  proximally in the region of the transition shown on prior CT. The transition appears to be abrupt and causes a blunted margin of the contrast column. Using low gravity pressure and turning the patient side to side did not allow [place] to extend through this region of narrowing. IMPRESSION: 1. Focal high-grade stenosis/obstruction in the distal sigmoid colon corresponding to the site of transition shown on prior CT. This could be from a focal stricture or scarring but is not entirely specific. Tumor not readily excluded. The proximal margin of the contrast column appears blunted in this vicinity. We were not able to cause contrast to flow proximal to this obstruction. Electronically Signed   By: Van Clines M.D.   On: 09/17/2017 13:43     IMPRESSION:   *   Distal sigmoid obstruction.  Abrupt onset.  Concerning for neoplasia.  No prodrome of symptoms to suggest Crohn's disease, infectious or ischemic colitis.  *   History CVA.  Chronic Plavix.  Last dose AM 4/20.    *   VATS mini thoracotomy, drainage of empyema decortication on 08/31/17 to address right lung empyema.  Grew strep viridans  from lung fluid.  Contd abx with 14  Days Zyvox as outpt.    *    Normocytic anemia.   No transfusions.     PLAN:     *   Flex sig, case d/w Dr  Hilarie Fredrickson.  Endo set this for after Dr P's 2 PM case.       Azucena Freed  09/17/2017, 2:43 PM Phone [phone]

## 2017-09-17 NOTE — Consult Note (Addendum)
[facility] Consult: 2:43 PM 09/17/2017  LOS: 3 days    Referring Provider: Dr. Nedra Hai.   Primary Care Physician:  Antonietta Jewel, MD in [place] Primary Gastroenterologist:  unassigned     Reason for Consultation:  Rectosigmoid stricture.     HPI: BARBARA KENG is a 67 y.o. male.  PMH CVA.  Carotid artery occlusion for which he is on chronic Plavix.Marland Kitchen  Hypertension.  Coronary artery and aortic atherosclerosis.  Anemia.  Chronic dental disease.  Has never undergone colonoscopy or upper endoscopy.  08/25/2017 -09/04/2017 admission with pneumonia and right empyema/pleural effusion.  Underwent 08/31/17 right VATS minithoracotomy and drainage of empyema and decortication.  Inpt Vancomycin and Zosyn, at discharge he was prescribed 14 days of oral Zyvox; Plavix, 81 mg aspirin continued.  During the admission his hemoglobin went from 13.7 >> 9.8. Normal MCV Coags normal.    The last time the patient had a bowel movement was on 08/29/17.  Not even a smear of stool since then.  Not passing flatus.  Abdomen distended but not painful.  Never vomited at home but about 10 days ago lost his appetite and was not eating much.  He was able and continue to swallow his medications.  Hiccups started about a week ago.  He presented to the emergency department and was admitted 4/18.  NG tube was placed.  This is putting out a greenish brown material.  Initially it put out 1 L.  Since then it has been declining from 200 ml >> 50 >> 50 >> 53ml.  Today, despite presence of the NG tube he vomited a small amount of material.  KUB initially showed mild gaseous distention of the small and large bowel compatible with a mild ileus. Contrasted CT abdomen pelvis with slight sigmoid wall thickening with gradual decompression.   Remainder of the  colon diffusely filled with fluid and air. ? of acute infectious or inflammatory process with resulting colonic dysmotility?  No stool is seen to suggest constipation.  Right lung shows small empyema with thickened rind. Water contrast BE today reveals high-grade stenosis/obstruction in the distal sigmoid corresponding to the changes seen on CT.  ?  Focal stricture or scarring?.  Tumor not excluded.  Despite maneuvering the patient's position, radiologist was unable to pass the contrast beyond this area of obstruction.    Hgb 13.5>> 10.4.  Hypokalemic and hypophosphtemia, now corrected.  No renal insufficiency.   Patient denies any prodrome of abdominal pain, GI upset.  Leading up to the abrupt termination of BMs there was some loosening of his stools but he never had watery diarrhea.  Has never seen blood per rectum.  The only incidence of vomiting was today.  He is 1 of 11 children.  None of his siblings nor his parents ever had problems with colon cancer or colitis.  No NSAIDs.          Past Medical History:  Diagnosis Date  . Aortic atherosclerosis ([place]) 08/27/2017  . Atherosclerosis of arteries   . Carotid artery occlusion   . Empyema (  [place]) 08/27/2017  . Hypertension   . Pulmonary nodule, right 08/27/2017  . Stroke [facility])     Past Surgical History:  Procedure Laterality Date  . DECORTICATION Right 08/30/2017   Procedure: DECORTICATION of right lower lung lobe;  Surgeon: Prescott Gum, Collier Salina, MD;  Location: [place];  Service: Thoracic;  Laterality: Right;  . FOOT SURGERY    . VIDEO ASSISTED THORACOSCOPY (VATS)/EMPYEMA Right 08/30/2017   Procedure: VIDEO ASSISTED THORACOSCOPY (VATS)/EMPYEMA   ;  Surgeon: Ivin Poot, MD;  Location: [place];  Service: Thoracic;  Laterality: Right;    Prior to Admission medications   Medication Sig Start Date End Date Taking? Authorizing Provider  acetaminophen (TYLENOL) 500 MG tablet Take 2 tablets (1,000 mg total) by mouth every 6 (six) hours as needed. 09/04/17   Yes Barrett, Erin R, PA-C  aspirin 81 MG EC tablet Take 81 mg by mouth daily.    Yes [provider]  clopidogrel (PLAVIX) 75 MG tablet Take 75 mg by mouth daily.   Yes [provider]  hydrochlorothiazide (HYDRODIURIL) 25 MG tablet Take 25 mg by mouth daily.   Yes [provider]  linezolid (ZYVOX) 600 MG tablet Take 1 tablet (600 mg total) by mouth 2 (two) times daily for 14 days. 09/04/17 09/18/17 Yes Barrett, Erin R, PA-C  simvastatin (ZOCOR) 10 MG tablet Take 20 mg by mouth at bedtime.    Yes [provider]  oxyCODONE (OXY IR/ROXICODONE) 5 MG immediate release tablet Take 1 tablet (5 mg total) by mouth every 4 (four) hours as needed for severe pain. Patient not taking: Reported on 09/14/2017 09/04/17   Barrett, Lodema Hong, PA-C    Scheduled Meds: . enoxaparin (LOVENOX) injection  40 mg Subcutaneous Q24H  . iopamidol      . mouth rinse  15 mL Mouth Rinse BID   Infusions: . famotidine (PEPCID) IV Stopped (09/17/17 1113)  . linezolid (ZYVOX) IV    . sodium chloride 0.45 % 1,000 mL with potassium chloride 40 mEq infusion 75 mL/hr at 09/17/17 0157   PRN Meds: acetaminophen, hydrALAZINE, morphine injection, ondansetron (ZOFRAN) IV   Allergies as of 09/13/2017  . (No Known Allergies)    Family History  Problem Relation Age of Onset  . Heart disease Other   . Hypertension Father   . Stroke Father   . Heart disease Father   . Diabetes Sister   . Diabetes Brother     Social History   Socioeconomic History  . Marital status: Single    Spouse name: Not on file  . Number of children: Not on file  . Years of education: Not on file  . Highest education level: Not on file  Occupational History  . Not on file  Social Needs  . Financial resource strain: Not on file  . Food insecurity:    Worry: Not on file    Inability: Not on file  . Transportation needs:    Medical: Not on file    Non-medical: Not on file  Tobacco Use  . Smoking status: Never  Smoker  . Smokeless tobacco: Never Used  Substance and Sexual Activity  . Alcohol use: No    Alcohol/week: 0.0 oz  . Drug use: No  . Sexual activity: Not on file  Lifestyle  . Physical activity:    Days per week: Not on file    Minutes per session: Not on file  . Stress: Not on file  Relationships  . Social connections:  Talks on phone: Not on file    Gets together: Not on file    Attends religious service: Not on file    Active member of club or organization: Not on file    Attends meetings of clubs or organizations: Not on file    Relationship status: Not on file  . Intimate partner violence:    Fear of current or ex partner: Not on file    Emotionally abused: Not on file    Physically abused: Not on file    Forced sexual activity: Not on file  Other Topics Concern  . Not on file  Social History Narrative  . Not on file    REVIEW OF SYSTEMS: Constitutional: Weakness.  No fatigue. ENT:  No nose bleeds Pulm: Occasional cough, primarily nonproductive.  Overall his pulmonary status is steadily improving. CV:  No palpitations, no LE edema.  GU:  No hematuria, no frequency GI:  Per HPI Heme: No unusual bleeding or bruising. Transfusions: None Neuro:  No headaches, no peripheral tingling or numbness Derm:  No itching, no rash or sores.  Endocrine:  No sweats or chills.  No polyuria or dysuria Immunization: Not inquire as to recent or previous vaccinations. Travel:  None beyond local counties in last few months.    PHYSICAL EXAM: Vital signs in last 24 hours: Vitals:   09/16/17 2051 09/17/17 0448  BP: 140/85 137/86  Pulse: 75 79  Resp: 16 18  Temp: 98.2 F (36.8 C) (!) 97.5 F (36.4 C)  SpO2: 99% 99%   Wt Readings from Last 3 Encounters:  09/14/17 167 lb 5.3 oz (75.9 kg)  08/26/17 173 lb (78.5 kg)  03/29/17 185 lb (83.9 kg)    General: Pleasant, looks moderately chronically ill but not acutely ill.  Comfortable. Head: No facial asymmetry or swelling.  No  signs of head trauma. Eyes: No conjunctival pallor.  No scleral icterus.  EOMI. Ears: Not hard of hearing Nose: No discharge or congestion.  NG tube is in place and effluent is brown.  Does not look particularly bloody, more like dark bile. Mouth: Moist, clear, oral mucosa.  Tongue midline.  Poor dentition. Neck: No JVD, no masses, no thyromegaly. Lungs: Clear bilaterally.  No dyspnea or cough.  Patient hiccups. Heart: RRR.  No MRG. Abdomen: Moderately tense.  Not tender.  Scant tinkling bowel sounds.  No tympanitic bowel sounds.  No HSM, masses, hernias..   Rectal: Deferred. Musc/Skeltl: No joint swelling, redness or significant deformity. Extremities: No CCE. Neurologic: Fully alert and oriented.  No limb weakness or tremor.  Moves all 4 limbs.  No gross neurologic deficits. Skin: No rash, no AVMs, no sores. Nodes: No cervical adenopathy. Psych: Affect a bit flat but pleasant, calm, cooperative.  Intake/Output from previous day: 04/21 0701 - 04/22 0700 In: 882.5 [I.V.:802.5; NG/GT:30; IV Piggyback:50] Out: 1485 [Urine:975; Emesis/NG output:510] Intake/Output this shift: Total I/O In: -  Out: 350 [Emesis/NG output:350]  LAB RESULTS: Recent Labs    09/15/17 0627  WBC 7.4  HGB 10.4*  HCT 33.2*  PLT 224   BMET Lab Results  Component Value Date   NA 133 (L) 09/17/2017   NA 136 09/16/2017   NA 137 09/16/2017   K 4.1 09/17/2017   K 3.7 09/16/2017   K 3.8 09/16/2017   CL 102 09/17/2017   CL 103 09/16/2017   CL 105 09/16/2017   CO2 22 09/17/2017   CO2 25 09/16/2017   CO2 25 09/16/2017   GLUCOSE 78 09/17/2017  GLUCOSE 104 (H) 09/16/2017   GLUCOSE 106 (H) 09/16/2017   BUN 8 09/17/2017   BUN 9 09/16/2017   BUN 9 09/16/2017   CREATININE 0.63 09/17/2017   CREATININE 0.71 09/16/2017   CREATININE 0.69 09/16/2017   CALCIUM 8.8 (L) 09/17/2017   CALCIUM 8.7 (L) 09/16/2017   CALCIUM 8.7 (L) 09/16/2017   LFT Recent Labs    09/15/17 0627 09/16/17 0922 09/17/17 0610    ALBUMIN 3.1* 3.2* 3.3*   PT/INR Lab Results  Component Value Date   INR 1.09 08/29/2017   INR 1.12 08/27/2017   INR 1.02 02/15/2011   Hepatitis Panel No results for input(s): HEPBSAG, HCVAB, HEPAIGM, HEPBIGM in the last 72 hours. C-Diff No components found for: CDIFF Lipase     Component Value Date/Time   LIPASE 37 09/13/2017 2127    Drugs of Abuse     Component Value Date/Time   LABOPIA NONE DETECTED 02/15/2011 0431   COCAINSCRNUR NONE DETECTED 02/15/2011 0431   LABBENZ NONE DETECTED 02/15/2011 0431   AMPHETMU NONE DETECTED 02/15/2011 0431   THCU POSITIVE (A) 02/15/2011 0431   LABBARB NONE DETECTED 02/15/2011 0431     RADIOLOGY STUDIES: Dg Abd Portable 1v  Result Date: 09/16/2017 CLINICAL DATA:  Nasogastric tube placement. EXAM: PORTABLE ABDOMEN - 1 VIEW COMPARISON:  Yesterday. FINDINGS: Mildly prominent gas-filled loops of colon and small bowel with an interval decrease in caliber. Interval nasogastric tube with its tip in the proximal stomach and side hole at or just below the gastroesophageal junction. Mild lumbar and lower thoracic spine degenerative changes. IMPRESSION: 1. Nasogastric tube tip in the proximal stomach and side hole at or just below the gastroesophageal junction. 2. Improving pattern of colonic and small bowel ileus. Electronically Signed   By: Claudie Revering M.D.   On: 09/16/2017 12:38   Dg Colon W/water Sol Cm  Result Date: 09/17/2017 CLINICAL DATA:  Abdominal pain. Lack of stool since surgery on August 30 2017 EXAM: COLON WITH WATER SOLUTION CONTRAST COMPARISON:  Multiple exams, including radiograph from 09/16/2017 and CT abdomen from 09/14/2017 FINDINGS: Initial KUB demonstrates borderline distention of proximal colon and mildly dilated loops of small bowel. Proximally 400 cc of Isovue mixed 3-1 with water was utilized. The rectum appears unremarkable but there is blunt abrupt truncation of the contrast column in the vicinity of the distal sigmoid colon  proximally in the region of the transition shown on prior CT. The transition appears to be abrupt and causes a blunted margin of the contrast column. Using low gravity pressure and turning the patient side to side did not allow [place] to extend through this region of narrowing. IMPRESSION: 1. Focal high-grade stenosis/obstruction in the distal sigmoid colon corresponding to the site of transition shown on prior CT. This could be from a focal stricture or scarring but is not entirely specific. Tumor not readily excluded. The proximal margin of the contrast column appears blunted in this vicinity. We were not able to cause contrast to flow proximal to this obstruction. Electronically Signed   By: Van Clines M.D.   On: 09/17/2017 13:43     IMPRESSION:   *   Distal sigmoid obstruction.  Abrupt onset.  Concerning for neoplasia.  No prodrome of symptoms to suggest Crohn's disease, infectious or ischemic colitis.  *   History CVA.  Chronic Plavix.  Last dose AM 4/20.    *   VATS mini thoracotomy, drainage of empyema decortication on 08/31/17 to address right lung empyema.  Grew strep viridans  from lung fluid.  Contd abx with 14  Days Zyvox as outpt.    *    Normocytic anemia.   No transfusions.     PLAN:     *   Flex sig, case d/w Dr  Hilarie Fredrickson.  Endo set this for after Dr P's 2 PM case.       Azucena Freed  09/17/2017, 2:43 PM Phone [phone]

## 2017-09-17 NOTE — Progress Notes (Signed)
[facility] PROGRESS NOTE  Alex [name] UGQ:[id number] DOB: 07-28-1950 DOA: 09/13/2017 PCP: Antonietta Jewel, MD  Brief summary   67 year old Caucasian male with past medical history significant for hypertension, hyperlipidemia, stroke, carotid artery stenosis, who was recently in the hospital for pneumonia and empyema and underwent surgical management. He presented with nausea, vomiting, abdominal distention as well as hypokalemia.  Abdominal imaging is indicated possible ileus, although with this worsening abdominal distention, vomiting and lack of bowel movement/flatus, there is concern that he is progressing to a bowel obstruction.  NG tube placed.  General surgery consulted.   Assessment/Plan:  Ileus vs bowel obstruction. Nausea & vomitingand abdominal distention: Imaging indicates possible ileus, although with his lack of bowel movements, persistent abdominal distention and vomiting, -cont NG tube, iv fluids, avoid opioids, appreciate general surgery input. Awaiting contrast study   History of stroke: asa and zocor to resume when able to take PO  HLD: Zocor  Hypokalemia:  Potassium is being replaced. Mag normal  UEK:CMKLK pressure stable. hold HCTZ . IV hydralazine prn  Recent hx ofEmpyema [facility]): s/p of decortication. No chest pain, cough, shortness rest, fever. Patient was started on linezolid during his last admission and plans are to continue antibiotic therapy until 4/23. Changed to IV while NPO    Code Status: full Family Communication: d/w patient, his family.  (indicate person spoken with, relationship, and if by phone, the number) Disposition Plan: home when stable    Consultants:  Surgery   Procedures:  NG tube   Antibiotics:  none (indicate start date, and stop date if known)  HPI/Subjective: Still on NTG suction. No flatus yet   Objective: Vitals:   09/16/17 2051 09/17/17 0448  BP: 140/85 137/86  Pulse: 75 79  Resp: 16 18  Temp:  98.2 F (36.8 C) (!) 97.5 F (36.4 C)  SpO2: 99% 99%    Intake/Output Summary (Last 24 hours) at 09/17/2017 1238 Last data filed at 09/17/2017 1200 Gross per 24 hour  Intake 882.5 ml  Output 1835 ml  Net -952.5 ml   Filed Weights   09/14/17 0400 09/14/17 1504  Weight: 78.5 kg (173 lb 1 oz) 75.9 kg (167 lb 5.3 oz)    Exam:   General:  No distress   Cardiovascular: s1,s2 rrr  Respiratory: CTA BL  Abdomen: soft, no BS  Musculoskeletal: no leg edema    Data Reviewed: Basic Metabolic Panel: Recent Labs  Lab 09/13/17 2127 09/14/17 0418 09/15/17 0627 09/16/17 0922 09/17/17 0610  NA 133* 133* 136 137  136 133*  K 3.0* 2.8* 3.1* 3.8  3.7 4.1  CL 91* 94* 102 105  103 102  CO2 25 26 25 25  25 22   GLUCOSE 134* 113* 107* 106*  104* 78  BUN 26* 24* 14 9  9 8   CREATININE 0.89 0.70 0.70 0.69  0.71 0.63  CALCIUM 9.9 8.7* 8.3* 8.7*  8.7* 8.8*  MG  --  2.2 1.8  --   --   PHOS  --   --  2.2* 2.5 2.9   Liver Function Tests: Recent Labs  Lab 09/13/17 2127 09/15/17 0627 09/16/17 0922 09/17/17 0610  AST 44*  --   --   --   ALT 48  --   --   --   ALKPHOS 73  --   --   --   BILITOT 1.1  --   --   --   PROT 8.4*  --   --   --  ALBUMIN 4.2 3.1* 3.2* 3.3*   Recent Labs  Lab 09/13/17 2127  LIPASE 37   No results for input(s): AMMONIA in the last 168 hours. CBC: Recent Labs  Lab 09/13/17 2127 09/14/17 0418 09/15/17 0627  WBC 12.2* 11.3* 7.4  NEUTROABS  --   --  5.2  HGB 13.5 11.7* 10.4*  HCT 40.3 36.1* 33.2*  MCV 81.4 81.1 82.8  PLT 427* 318 224   Cardiac Enzymes: No results for input(s): CKTOTAL, CKMB, CKMBINDEX, TROPONINI in the last 168 hours. BNP (last 3 results) No results for input(s): BNP in the last 8760 hours.  ProBNP (last 3 results) No results for input(s): PROBNP in the last 8760 hours.  CBG: No results for input(s): GLUCAP in the last 168 hours.  No results found for this or any previous visit (from the past 240 hour(s)).    Studies: Dg Abd Portable 1v  Result Date: 09/16/2017 CLINICAL DATA:  Nasogastric tube placement. EXAM: PORTABLE ABDOMEN - 1 VIEW COMPARISON:  Yesterday. FINDINGS: Mildly prominent gas-filled loops of colon and small bowel with an interval decrease in caliber. Interval nasogastric tube with its tip in the proximal stomach and side hole at or just below the gastroesophageal junction. Mild lumbar and lower thoracic spine degenerative changes. IMPRESSION: 1. Nasogastric tube tip in the proximal stomach and side hole at or just below the gastroesophageal junction. 2. Improving pattern of colonic and small bowel ileus. Electronically Signed   By: Claudie Revering M.D.   On: 09/16/2017 12:38    Scheduled Meds: . enoxaparin (LOVENOX) injection  40 mg Subcutaneous Q24H  . iopamidol      . linezolid  600 mg Oral BID  . mouth rinse  15 mL Mouth Rinse BID   Continuous Infusions: . famotidine (PEPCID) IV Stopped (09/17/17 1113)  . sodium chloride 0.45 % 1,000 mL with potassium chloride 40 mEq infusion 75 mL/hr at 09/17/17 0157    Principal Problem:   Nausea & vomiting Active Problems:   History of stroke   Empyema (HCC)   Benign essential HTN   Abdominal distension   Dehydration   Hypokalemia   HLD (hyperlipidemia)   Nausea and vomiting    Time spent: >35 minutes     Kinnie Feil  [facility] Pager [phone]. If 7PM-7AM, please contact night-coverage at [email or web address], password [facility] 09/17/2017, 12:38 PM  LOS: 3 days

## 2017-09-18 ENCOUNTER — Other Ambulatory Visit: Payer: Self-pay

## 2017-09-18 ENCOUNTER — Other Ambulatory Visit: Payer: Self-pay | Admitting: Internal Medicine

## 2017-09-18 ENCOUNTER — Encounter (HOSPITAL_COMMUNITY): Payer: Self-pay | Admitting: Emergency Medicine

## 2017-09-18 ENCOUNTER — Encounter (HOSPITAL_COMMUNITY)
Admission: EM | Disposition: A | Discharge status: Home Health | Payer: Self-pay | Source: Home / Self Care | Attending: Family Medicine

## 2017-09-18 DIAGNOSIS — C187 Malignant neoplasm of sigmoid colon: Secondary | ICD-10-CM

## 2017-09-18 DIAGNOSIS — D127 Benign neoplasm of rectosigmoid junction: Secondary | ICD-10-CM

## 2017-09-18 DIAGNOSIS — K56609 Unspecified intestinal obstruction, unspecified as to partial versus complete obstruction: Secondary | ICD-10-CM

## 2017-09-18 HISTORY — PX: FLEXIBLE SIGMOIDOSCOPY: SHX5431

## 2017-09-18 LAB — PREALBUMIN: Prealbumin: 20.1 mg/dL (ref 18–38)

## 2017-09-18 SURGERY — SIGMOIDOSCOPY, FLEXIBLE
Anesthesia: Moderate Sedation

## 2017-09-18 MED ORDER — FENTANYL CITRATE (PF) 100 MCG/2ML IJ SOLN
INTRAMUSCULAR | Status: AC
  Filled 2017-09-18: qty 2

## 2017-09-18 MED ORDER — SPOT INK MARKER SYRINGE KIT
PACK | SUBMUCOSAL | Status: AC
  Filled 2017-09-18: qty 5

## 2017-09-18 MED ORDER — MIDAZOLAM HCL 10 MG/2ML IJ SOLN
INTRAMUSCULAR | Status: DC | PRN
  Administered 2017-09-18: 1 mg via INTRAVENOUS
  Administered 2017-09-18: 2 mg via INTRAVENOUS

## 2017-09-18 MED ORDER — FENTANYL CITRATE (PF) 100 MCG/2ML IJ SOLN
INTRAMUSCULAR | Status: DC | PRN
  Administered 2017-09-18 (×2): 25 ug via INTRAVENOUS

## 2017-09-18 MED ORDER — MIDAZOLAM HCL 5 MG/ML IJ SOLN
INTRAMUSCULAR | Status: AC
Start: 2017-09-18 — End: ?
  Filled 2017-09-18: qty 2

## 2017-09-18 MED ORDER — MIDAZOLAM HCL 5 MG/ML IJ SOLN
INTRAMUSCULAR | Status: AC
  Filled 2017-09-18: qty 2

## 2017-09-18 MED ORDER — SPOT INK MARKER SYRINGE KIT
PACK | SUBMUCOSAL | Status: DC | PRN
  Administered 2017-09-18: 3 mL via SUBMUCOSAL

## 2017-09-18 NOTE — Op Note (Signed)
[facility] Patient Name: Alex [name] Procedure Date : 09/18/2017 MRN: [id number] Attending MD: Jerene Bears , MD Date of Birth: 09-10-1950 CSN: [id number] Age: 67 Admit Type: Outpatient Procedure:                Flexible Sigmoidoscopy Indications:              Abnormal barium enema, Abnormal CT of the GI tract,                            Colonic obstruction Providers:                Lajuan Lines. Hilarie Fredrickson, MD, Cleda Daub, RN, Charolette Child, Technician Referring MD:             [facility] Medicines:                Fentanyl 50 micrograms IV, Midazolam 3 mg IV Complications:            No immediate complications. Estimated Blood Loss:     Estimated blood loss was minimal. Procedure:                Pre-Anesthesia Assessment:                           - Prior to the procedure, a History and Physical                            was performed, and patient medications and                            allergies were reviewed. The patient's tolerance of                            previous anesthesia was also reviewed. The risks                            and benefits of the procedure and the sedation                            options and risks were discussed with the patient.                            All questions were answered, and informed consent                            was obtained. Prior Anticoagulants: The patient has                            taken Plavix (clopidogrel), last dose was 3 days                            prior to procedure. ASA Grade Assessment: III - A  patient with severe systemic disease. After                            reviewing the risks and benefits, the patient was                            deemed in satisfactory condition to undergo the                            procedure.                           After obtaining informed consent, the scope was                            passed  under direct vision. The [id number] ([id number])                            scope was introduced through the anus and advanced                            to the sigmoid colon. The flexible sigmoidoscopy                            was accomplished without difficulty. The patient                            tolerated the procedure well. The quality of the                            bowel preparation was good. Scope In: Scope Out: Findings:      The digital rectal exam was normal.      A fungating completely obstructing large mass was found in the distal       sigmoid colon. Despite the ultraslim Pentax colonoscope this lesion       could not be traversed. It begins between 15-20 cm from the dentate       line. No bleeding was present until the lesion was touched with the       scope. This was biopsied with a cold forceps for histology. Area 2-3 cm       distal to the mass was tattooed with 3 injections totally 3 mL of Spot       (carbon black).      Two sessile polyps were found in the rectum (12 mm) and recto-sigmoid       colon (5 mm). The polyps were 5 to 12 mm in size. Polypectomy was not       attempted due to the identification of cancer.      Internal hemorrhoids were found during retroflexion. The hemorrhoids       were small. 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 Biopsied. Tattooed.                           -  Two 5 to 12 mm polyps in the rectum and at the                            recto-sigmoid colon. Resection not attempted.                           - Internal hemorrhoids. Moderate Sedation:      Moderate (conscious) sedation was administered by the endoscopy nurse       and supervised by the endoscopist. The following parameters were       monitored: oxygen saturation, heart rate, blood pressure, and response       to care. Total physician intraservice time was 21  minutes. Recommendation:           - Return patient to hospital ward for ongoing care.                           - Await pathology results.                           - NPO given colonic obstruction.                           - Surgical management of obstructing sigmoid cancer.                           - Full colonoscopy is recommended as an outpatient                            after recovery from colon resection to remove                            recto-sigmoid and rectal polyps and screening the                            unexamined colon. Procedure Code(s):        --- Professional ---                           [phone], Sigmoidoscopy, flexible; with biopsy, single                            or multiple                           [id number], Sigmoidoscopy, flexible; with directed                            submucosal injection(s), any substance                           49702, 59, Moderate sedation services provided by                            the same physician or other qualified health care  professional performing the diagnostic or                            therapeutic service that the sedation supports,                            requiring the presence of an independent trained                            observer to assist in the monitoring of the                            patient's level of consciousness and physiological                            status; initial 15 minutes of intraservice time,                            patient age 81 years or older Diagnosis Code(s):        --- Professional ---                           D49.0, Neoplasm of unspecified behavior of                            digestive system                           K56.691, Other complete intestinal obstruction                           K62.1, Rectal polyp                           D12.7, Benign neoplasm of rectosigmoid junction                           K64.8, Other hemorrhoids                            R93.3, Abnormal findings on diagnostic imaging of                            other parts of digestive tract                           K56.609, Unspecified intestinal obstruction,                            unspecified as to partial versus complete                            obstruction CPT copyright 2017 [employer]. All rights reserved. The codes documented in this report are preliminary and upon coder review may  be revised to meet current compliance requirements. Jerene Bears, MD 09/18/2017 3:31:20 PM This report has been signed  electronically. Number of Addenda: 0

## 2017-09-18 NOTE — Progress Notes (Addendum)
[facility] Progress Note     Subjective: CC:  Denies abdominal pain. Denies flatus or BM. No complaints. Emesis overnight, resolved s/p flushing NG tube.  Discussed indication for flex sig and possible surgical outcomes with patient and answered his questions.  Objective: Vital signs in last 24 hours: Temp:  [97.9 F (36.6 C)-98 F (36.7 C)] 97.9 F (36.6 C) (04/23 0455) Pulse Rate:  [82-89] 82 (04/23 0455) Resp:  [17-19] 19 (04/23 0455) BP: (131-135)/(81-83) 135/81 (04/23 0455) SpO2:  [98 %] 98 % (04/23 0455) Last BM Date: 08/29/17  Intake/Output from previous day: 04/22 0701 - 04/23 0700 In: 1913.8 [I.V.:1513.8; IV Piggyback:400] Out: 1775 [Urine:625; Emesis/NG output:1150] Intake/Output this shift: No intake/output data recorded.  PE: Gen:  Alert, NAD, pleasant Card:  Regular rate and rhythm, pedal pulses 2+ BL Pulm:  Normal effort, clear to auscultation bilaterally Abd: Soft, non-tender, distended, +BS;  NGT: 1,150cc/24h feculent Skin: warm and dry, no rashes  Psych: A&Ox3   Lab Results:  No results for input(s): WBC, HGB, HCT, PLT in the last 72 hours. BMET Recent Labs    09/16/17 0922 09/17/17 0610  NA 137  136 133*  K 3.8  3.7 4.1  CL 105  103 102  CO2 25  25 22   GLUCOSE 106*  104* 78  BUN 9  9 8   CREATININE 0.69  0.71 0.63  CALCIUM 8.7*  8.7* 8.8*   PT/INR No results for input(s): LABPROT, INR in the last 72 hours. CMP     Component Value Date/Time   NA 133 (L) 09/17/2017 0610   K 4.1 09/17/2017 0610   CL 102 09/17/2017 0610   CO2 22 09/17/2017 0610   GLUCOSE 78 09/17/2017 0610   BUN 8 09/17/2017 0610   CREATININE 0.63 09/17/2017 0610   CALCIUM 8.8 (L) 09/17/2017 0610   PROT 8.4 (H) 09/13/2017 2127   ALBUMIN 3.3 (L) 09/17/2017 0610   AST 44 (H) 09/13/2017 2127   ALT 48 09/13/2017 2127   ALKPHOS 73 09/13/2017 2127   BILITOT 1.1 09/13/2017 2127   GFRNONAA >60 09/17/2017 0610   GFRAA >60 09/17/2017 0610   Lipase      Component Value Date/Time   LIPASE 37 09/13/2017 2127       Studies/Results: Dg Abd Portable 1v  Result Date: 09/16/2017 CLINICAL DATA:  Nasogastric tube placement. EXAM: PORTABLE ABDOMEN - 1 VIEW COMPARISON:  Yesterday. FINDINGS: Mildly prominent gas-filled loops of colon and small bowel with an interval decrease in caliber. Interval nasogastric tube with its tip in the proximal stomach and side hole at or just below the gastroesophageal junction. Mild lumbar and lower thoracic spine degenerative changes. IMPRESSION: 1. Nasogastric tube tip in the proximal stomach and side hole at or just below the gastroesophageal junction. 2. Improving pattern of colonic and small bowel ileus. Electronically Signed   By: Claudie Revering M.D.   On: 09/16/2017 12:38   Dg Colon W/water Sol Cm  Result Date: 09/17/2017 CLINICAL DATA:  Abdominal pain. Lack of stool since surgery on August 30 2017 EXAM: COLON WITH WATER SOLUTION CONTRAST COMPARISON:  Multiple exams, including radiograph from 09/16/2017 and CT abdomen from 09/14/2017 FINDINGS: Initial KUB demonstrates borderline distention of proximal colon and mildly dilated loops of small bowel. Proximally 400 cc of Isovue mixed 3-1 with water was utilized. The rectum appears unremarkable but there is blunt abrupt truncation of the contrast column in the vicinity of the distal sigmoid colon proximally in the region of the transition shown  on prior CT. The transition appears to be abrupt and causes a blunted margin of the contrast column. Using low gravity pressure and turning the patient side to side did not allow [place] to extend through this region of narrowing. IMPRESSION: 1. Focal high-grade stenosis/obstruction in the distal sigmoid colon corresponding to the site of transition shown on prior CT. This could be from a focal stricture or scarring but is not entirely specific. Tumor not readily excluded. The proximal margin of the contrast column appears blunted in this  vicinity. We were not able to cause contrast to flow proximal to this obstruction. Electronically Signed   By: Van Clines M.D.   On: 09/17/2017 13:43    Anti-infectives: Anti-infectives (From admission, onward)   Start     Dose/Rate Route Frequency Ordered Stop   09/17/17 1245  linezolid (ZYVOX) IVPB 600 mg     600 mg 300 mL/hr over 60 Minutes Intravenous Every 12 hours 09/17/17 1243     09/14/17 1000  linezolid (ZYVOX) tablet 600 mg  Status:  Discontinued     600 mg Oral 2 times daily 09/14/17 0401 09/17/17 1243     Assessment/Plan Large bowel obstruction - s/p VATS/decortication 08/30/17; no history of SBO or abdominal surgery; no Hx diverticulitis  - CT scan 4/19 shows normal stomach and SB. Colon is filled with liquid/air to the level of the sigmoid. Mild sigmoid wall thickening.  - rectal contrast study 4/22 showed high grade stenosis/obstruction of distal sigmoid. I consulted GI and they are performing flex sig today. Appreciate their help. - continue NG tube to LIWS, NGT  - minimize narcotic medications and keep Mg > 2.0 and K > 4.0; I would recommend starting TNA given prolonged NPO status.  FEN: NPO, IVF, NGT to LIWS; consider PICC and TNA ID: Linezolid per CT surgery VTE: SCD's, Lovenox   Dispo: Flex sig today We will follow    LOS: 4 days    Jill Alexanders , [facility] 09/18/2017, 9:17 AM Pager: [phone] Consults: [phone] Mon-Fri 7:00 am-4:30 pm Sat-Sun 7:00 am-11:30 am

## 2017-09-18 NOTE — Progress Notes (Signed)
[facility] PROGRESS NOTE  MIDAS DAUGHETY SWF:[id number] DOB: [birth year] DOA: 09/13/2017 PCP: Antonietta Jewel, MD  Brief summary   67 year old Caucasian male with past medical history significant for hypertension, hyperlipidemia, stroke, carotid artery stenosis, who was recently in the hospital for pneumonia and empyema and underwent surgical management. He presented with nausea, vomiting, abdominal distention as well as hypokalemia.  Abdominal imaging is indicated possible ileus, although with this worsening abdominal distention, vomiting and lack of bowel movement/flatus, there is concern that he is progressing to a bowel obstruction.  NG tube placed.  General surgery consulted.  HPI/Subjective: Patient had an episode of nausea and vomiting 400 cc green fluid this a.m., apparently NGT was clogged, has improved after water flush, had 500 cc output over last 6 hours.  Assessment/Plan:  Nausea and vomiting secondary to large bowel obstruction  -Patient presents with nausea and vomiting, had some relief after NGT insertion CT abdomen pelvis on admission showing normal stomach and small bowels, and colon filled with liquid/air at the level of the sigmoid with mild sigmoid wall thickening, rectal contrast 4/22 showing high grade stenosis/obstruction of the distal sigmoid, with no contrast passing proximally. -GI input greatly appreciated, plan for flex sig today. -Continue with as needed nausea medication, keep n.p.o., continue with IV fluids, continue with NGT on ILS. -We will await flex sig finding today, then will determine if he will be started on TNA if anticipated prolonged n.p.o. Course.  Recent hx ofEmpyema [facility]):  - s/p of decortication. No chest pain, cough, shortness rest, fever. Patient was started on linezolid during his last admission and plans are to continue antibiotic therapy until 4/23. Changed to IV while NPO, today would be his last day of Zyvox.  History of stroke:   -Resume aspirin and Zocor when able to take oral   HLD:  - Zocor when able to take oral  Hypokalemia:   - Potassium is being replaced. Mag normal  HTN: - blood pressure stable. hold HCTZ . IV hydralazine prn      Code Status: full Family Communication: None at bedside Disposition Plan: home when stable  DVT prophylaxis: Subcu Lovenox   Consultants:  Surgery   GI  Procedures:  NG tube   Antibiotics:  none (indicate start date, and stop date if known)   Objective: Vitals:   09/18/17 0455 09/18/17 1247  BP: 135/81 (!) 143/84  Pulse: 82 90  Resp: 19   Temp: 97.9 F (36.6 C) 98.2 F (36.8 C)  SpO2: 98% 95%    Intake/Output Summary (Last 24 hours) at 09/18/2017 1342 Last data filed at 09/18/2017 1020 Gross per 24 hour  Intake 1913.75 ml  Output 1500 ml  Net 413.75 ml   Filed Weights   09/14/17 0400 09/14/17 1504  Weight: 78.5 kg (173 lb 1 oz) 75.9 kg (167 lb 5.3 oz)    Exam:  Awake alert x3, laying in bed in no apparent distress, has NGT Good air entry bilaterally, no wheezing rales rhonchi Regular rate and rhythm, no rubs murmurs gallops Abdomen soft, nontender, nondistended, bowel sounds present Extremity with no edema, clubbing or cyanosis  Data Reviewed: Basic Metabolic Panel: Recent Labs  Lab 09/13/17 2127 09/14/17 0418 09/15/17 0627 09/16/17 0922 09/17/17 0610  NA 133* 133* 136 137  136 133*  K 3.0* 2.8* 3.1* 3.8  3.7 4.1  CL 91* 94* 102 105  103 102  CO2 25 26 25 25  25 22   GLUCOSE 134* 113* 107* 106*  104* 78  BUN 26* 24* 14 9  9 8   CREATININE 0.89 0.70 0.70 0.69  0.71 0.63  CALCIUM 9.9 8.7* 8.3* 8.7*  8.7* 8.8*  MG  --  2.2 1.8  --   --   PHOS  --   --  2.2* 2.5 2.9   Liver Function Tests: Recent Labs  Lab 09/13/17 2127 09/15/17 0627 09/16/17 0922 09/17/17 0610  AST 44*  --   --   --   ALT 48  --   --   --   ALKPHOS 73  --   --   --   BILITOT 1.1  --   --   --   PROT 8.4*  --   --   --   ALBUMIN 4.2  3.1* 3.2* 3.3*   Recent Labs  Lab 09/13/17 2127  LIPASE 37   No results for input(s): AMMONIA in the last 168 hours. CBC: Recent Labs  Lab 09/13/17 2127 09/14/17 0418 09/15/17 0627  WBC 12.2* 11.3* 7.4  NEUTROABS  --   --  5.2  HGB 13.5 11.7* 10.4*  HCT 40.3 36.1* 33.2*  MCV 81.4 81.1 82.8  PLT 427* 318 224   Cardiac Enzymes: No results for input(s): CKTOTAL, CKMB, CKMBINDEX, TROPONINI in the last 168 hours. BNP (last 3 results) No results for input(s): BNP in the last 8760 hours.  ProBNP (last 3 results) No results for input(s): PROBNP in the last 8760 hours.  CBG: No results for input(s): GLUCAP in the last 168 hours.  No results found for this or any previous visit (from the past 240 hour(s)).   Studies: Dg Colon W/water Sol Cm  Result Date: 09/17/2017 CLINICAL DATA:  Abdominal pain. Lack of stool since surgery on August 30 2017 EXAM: COLON WITH WATER SOLUTION CONTRAST COMPARISON:  Multiple exams, including radiograph from 09/16/2017 and CT abdomen from 09/14/2017 FINDINGS: Initial KUB demonstrates borderline distention of proximal colon and mildly dilated loops of small bowel. Proximally 400 cc of Isovue mixed 3-1 with water was utilized. The rectum appears unremarkable but there is blunt abrupt truncation of the contrast column in the vicinity of the distal sigmoid colon proximally in the region of the transition shown on prior CT. The transition appears to be abrupt and causes a blunted margin of the contrast column. Using low gravity pressure and turning the patient side to side did not allow [place] to extend through this region of narrowing. IMPRESSION: 1. Focal high-grade stenosis/obstruction in the distal sigmoid colon corresponding to the site of transition shown on prior CT. This could be from a focal stricture or scarring but is not entirely specific. Tumor not readily excluded. The proximal margin of the contrast column appears blunted in this vicinity. We were not able  to cause contrast to flow proximal to this obstruction. Electronically Signed   By: Van Clines M.D.   On: 09/17/2017 13:43    Scheduled Meds: . enoxaparin (LOVENOX) injection  40 mg Subcutaneous Q24H  . mouth rinse  15 mL Mouth Rinse BID   Continuous Infusions: . famotidine (PEPCID) IV Stopped (09/18/17 1040)  . linezolid (ZYVOX) IV Stopped (09/18/17 1115)  . sodium chloride 0.45 % 1,000 mL with potassium chloride 40 mEq infusion 75 mL/hr at 09/18/17 0550    Principal Problem:   Nausea & vomiting Active Problems:   History of stroke   Empyema (HCC)   Benign essential HTN   Abdominal distension   Dehydration   Hypokalemia   HLD (hyperlipidemia)   Nausea  and vomiting    Time spent: 25 minutes     Phillips Climes MD [facility] Pager [phone]. If 7PM-7AM, please contact night-coverage at [email or web address], password [facility] 09/18/2017, 1:42 PM  LOS: 4 days

## 2017-09-18 NOTE — Interval H&P Note (Signed)
History and Physical Interval Note: For flex sig today to evaluate distal colon stricture/obstruction. The nature of the procedure, as well as the risks, benefits, and alternatives were carefully and thoroughly reviewed with the patient. Ample time for discussion and questions allowed. The patient understood, was satisfied, and agreed to proceed.     09/18/2017 2:50 PM  Alex [name]  has presented today for surgery, with the diagnosis of Obstruction in the distal sigmoid  The various methods of treatment have been discussed with the patient and family. After consideration of risks, benefits and other options for treatment, the patient has consented to  Procedure(s): FLEXIBLE SIGMOIDOSCOPY (N/A) as a surgical intervention .  The patient's history has been reviewed, patient examined, no change in status, stable for surgery.  I have reviewed the patient's chart and labs.  Questions were answered to the patient's satisfaction.     Lajuan Lines Amarien Carne

## 2017-09-19 ENCOUNTER — Encounter (HOSPITAL_COMMUNITY): Payer: Self-pay | Admitting: Internal Medicine

## 2017-09-19 LAB — BASIC METABOLIC PANEL
Anion gap: 14 (ref 5–15)
BUN: 11 mg/dL (ref 6–20)
CALCIUM: 9.2 mg/dL (ref 8.9–10.3)
CO2: 25 mmol/L (ref 22–32)
CREATININE: 0.83 mg/dL (ref 0.61–1.24)
Chloride: 95 mmol/L — ABNORMAL LOW (ref 101–111)
GFR calc Af Amer: >60 mL/min (ref >60)
GLUCOSE: 72 mg/dL (ref 65–99)
Potassium: 4.9 mmol/L (ref 3.5–5.1)
Sodium: 134 mmol/L — ABNORMAL LOW (ref 135–145)

## 2017-09-19 LAB — CBC
HCT: 34.7 % — ABNORMAL LOW (ref 39.0–52.0)
Hemoglobin: 11 g/dL — ABNORMAL LOW (ref 13.0–17.0)
MCH: 25.8 pg — ABNORMAL LOW (ref 26.0–34.0)
MCHC: 31.7 g/dL (ref 30.0–36.0)
MCV: 81.5 fL (ref 78.0–100.0)
PLATELETS: 239 10*3/uL (ref 150–400)
RBC: 4.26 MIL/uL (ref 4.22–5.81)
RDW: 15.1 % (ref 11.5–15.5)
WBC: 9.5 10*3/uL (ref 4.0–10.5)

## 2017-09-19 LAB — SURGICAL PCR SCREEN
MRSA, PCR: POSITIVE — AB
STAPHYLOCOCCUS AUREUS: POSITIVE — AB

## 2017-09-19 LAB — TYPE AND SCREEN
ABO/RH(D): A POS
Antibody Screen: NEGATIVE

## 2017-09-19 LAB — HEMOGLOBIN AND HEMATOCRIT, BLOOD
HCT: 34.2 % — ABNORMAL LOW (ref 39.0–52.0)
Hemoglobin: 10.8 g/dL — ABNORMAL LOW (ref 13.0–17.0)

## 2017-09-19 MED ORDER — MUPIROCIN 2 % EX OINT
1.0000 "application " | TOPICAL_OINTMENT | Freq: Two times a day (BID) | CUTANEOUS | Status: AC
  Administered 2017-09-19 – 2017-09-24 (×9): 1 via NASAL
  Filled 2017-09-19: qty 22

## 2017-09-19 MED ORDER — SODIUM CHLORIDE 0.9 % IV SOLN: 2.0000 g | INTRAVENOUS | Status: DC

## 2017-09-19 MED ORDER — SODIUM CHLORIDE 0.9 % IV SOLN
2.0000 g | INTRAVENOUS | Status: AC
  Administered 2017-09-20: 2 g via INTRAVENOUS
  Filled 2017-09-19: qty 2

## 2017-09-19 MED ORDER — CHLORHEXIDINE GLUCONATE CLOTH 2 % EX PADS
6.0000 | MEDICATED_PAD | Freq: Every day | CUTANEOUS | Status: AC
  Administered 2017-09-20 – 2017-09-24 (×5): 6 via TOPICAL

## 2017-09-19 NOTE — Progress Notes (Signed)
PROGRESS NOTE    Alex [name]  ZOX:[id number] DOB: [birth year] DOA: 09/13/2017 PCP: Antonietta Jewel, MD   Brief Narrative:  67 year old Caucasian male with past medical history significant for hypertension, hyperlipidemia, stroke, carotid artery stenosis, who was recently in the hospital for pneumonia and empyema and underwent surgical management. He presented with nausea, vomiting, abdominal distention as well as hypokalemia.Abdominal imaging is indicated possible ileus, although with this worsening abdominal distention, vomiting and lack of bowel movement/flatus, there is concern that he is progressing to Shannia Jacuinde bowel obstruction. NG tube placed. General surgery consulted.  Assessment & Plan:   Principal Problem:   Nausea & vomiting Active Problems:   History of stroke   Empyema (HCC)   Benign essential HTN   Abdominal distension   Dehydration   Hypokalemia   HLD (hyperlipidemia)   Nausea and vomiting   Malignant tumor of sigmoid colon (HCC)   Colon obstruction (HCC)   Nausea and vomiting secondary to large bowel obstruction  -Patient presents with nausea and vomiting, had some relief after NGT insertion CT abdomen pelvis on admission showing normal stomach and small bowels, and colon filled with liquid/air at the level of the sigmoid with mild sigmoid wall thickening, rectal contrast 4/22 showing high grade stenosis/obstruction of the distal sigmoid, with no contrast passing proximally. -GI input greatly appreciated, flex sig with likely malignant obstructing tumor in sigmoid colon - Appreciate surgery c/s.  Planning for partial colectomy and colostomy tomorrow.   -Continue with as needed nausea medication, keep n.p.o., continue with IV fluids, continue with NGT on ILS. -Will need full colonoscopy as an outpatient to remove rectosigmoid and rectal polyps and screen unexamined colon  Bright Red Blood Per Rectum:  Pt hemodynamically stable.  Sounds like it has resolved at this  point.  Likely related to biopsy of lesion from yesterday.  Will continue to monitor for now.  Hold lovenox.  Check H/H.  Will discuss with GI.  Pt to go to OR tomorrow.    Recent hx ofEmpyema [facility]):  - s/p decortication. No chest pain, cough, shortness rest, fever. Patient was started on linezolid during his last admission and antibiotics were continued until 4/23.   History of stroke:  -Resume aspirin and Zocor when able to take oral   HLD:  - Zocor when able to take oral  Hypokalemia:  - Potassium is being replaced. Mag normal  HTN: - blood pressure stable. hold HCTZ . IV hydralazine prn  DVT prophylaxis: lovenox Code Status: full  Family Communication: none at bedside Disposition Plan: pending surgery and further improvement   Consultants:   GI  Surgery  Procedures:  4/23 Flex Sig - Likely malignant completely obstructing tumor in the distal sigmoid colon. Biopsied. Tattooed. - Two 5 to 12 mm polyps in the rectum and at the recto-sigmoid colon. Resection not Attempted. - Internal hemorrhoids. - Return patient to hospital ward for ongoing care. - Await pathology results. - NPO given colonic obstruction. - Surgical management of obstructing sigmoid cancer. - Full colonoscopy is recommended as an outpatient after recovery from colon resection to remove recto-sigmoid and rectal polyps and screening the unexamined Colon.  Antimicrobials:  Anti-infectives (From admission, onward)   Start     Dose/Rate Route Frequency Ordered Stop   09/20/17 0730  cefoTEtan (CEFOTAN) 2 g in sodium chloride 0.9 % 100 mL IVPB     2 g 200 mL/hr over 30 Minutes Intravenous 30 min pre-op 09/19/17 1156     09/19/17 1145  cefoTEtan (CEFOTAN)  2 g in sodium chloride 0.9 % 100 mL IVPB  Status:  Discontinued     2 g 200 mL/hr over 30 Minutes Intravenous On call to O.R. 09/19/17 1144 09/19/17 1156   09/17/17 1245  linezolid (ZYVOX) IVPB 600 mg     600 mg 300 mL/hr over 60 Minutes  Intravenous Every 12 hours 09/17/17 1243 09/19/17 0014   09/14/17 1000  linezolid (ZYVOX) tablet 600 mg  Status:  Discontinued     600 mg Oral 2 times daily 09/14/17 0401 09/17/17 1243     Subjective: No pain.  Some nausea.  No vomiting.   Objective: Vitals:   09/18/17 1602 09/18/17 2108 09/19/17 0525 09/19/17 1318  BP: 126/85 131/85 139/80 138/85  Pulse: 85 90 84 90  Resp: 16 18 19  (!) 21  Temp:  98.7 F (37.1 C) 98.1 F (36.7 C) 97.8 F (36.6 C)  TempSrc:  Oral Oral Oral  SpO2: 98% 95% 97% 98%  Weight:      Height:        Intake/Output Summary (Last 24 hours) at 09/19/2017 1654 Last data filed at 09/19/2017 1259 Gross per 24 hour  Intake -  Output 2050 ml  Net -2050 ml   Filed Weights   09/14/17 0400 09/14/17 1504  Weight: 78.5 kg (173 lb 1 oz) 75.9 kg (167 lb 5.3 oz)    Examination:  General exam: Appears calm and comfortable  Respiratory system: Clear to auscultation. Respiratory effort normal. Cardiovascular system: S1 & S2 heard, RRR. No JVD, murmurs, rubs, gallops or clicks. No pedal edema. Gastrointestinal system: Abdomen is nondistended, soft and nontender. No organomegaly or masses felt. Quiet bowel sounds heard.  NG in place. Central nervous system: Alert and oriented. No focal neurological deficits. Extremities: Symmetric 5 x 5 power. Skin: No rashes, lesions or ulcers Psychiatry: Judgement and insight appear normal. Mood & affect appropriate.     Data Reviewed: I have personally reviewed following labs and imaging studies  CBC: Recent Labs  Lab 09/13/17 2127 09/14/17 0418 09/15/17 0627 09/19/17 0501  WBC 12.2* 11.3* 7.4 9.5  NEUTROABS  --   --  5.2  --   HGB 13.5 11.7* 10.4* 11.0*  HCT 40.3 36.1* 33.2* 34.7*  MCV 81.4 81.1 82.8 81.5  PLT 427* 318 224 443   Basic Metabolic Panel: Recent Labs  Lab 09/14/17 0418 09/15/17 0627 09/16/17 0922 09/17/17 0610 09/19/17 0501  NA 133* 136 137  136 133* 134*  K 2.8* 3.1* 3.8  3.7 4.1 4.9  CL  94* 102 105  103 102 95*  CO2 26 25 25  25 22 25   GLUCOSE 113* 107* 106*  104* 78 72  BUN 24* 14 9  9 8 11   CREATININE 0.70 0.70 0.69  0.71 0.63 0.83  CALCIUM 8.7* 8.3* 8.7*  8.7* 8.8* 9.2  MG 2.2 1.8  --   --   --   PHOS  --  2.2* 2.5 2.9  --    GFR: Estimated Creatinine Clearance: 79 mL/min (by C-G formula based on SCr of 0.83 mg/dL). Liver Function Tests: Recent Labs  Lab 09/13/17 2127 09/15/17 0627 09/16/17 0922 09/17/17 0610  AST 44*  --   --   --   ALT 48  --   --   --   ALKPHOS 73  --   --   --   BILITOT 1.1  --   --   --   PROT 8.4*  --   --   --  ALBUMIN 4.2 3.1* 3.2* 3.3*   Recent Labs  Lab 09/13/17 2127  LIPASE 37   No results for input(s): AMMONIA in the last 168 hours. Coagulation Profile: No results for input(s): INR, PROTIME in the last 168 hours. Cardiac Enzymes: No results for input(s): CKTOTAL, CKMB, CKMBINDEX, TROPONINI in the last 168 hours. BNP (last 3 results) No results for input(s): PROBNP in the last 8760 hours. HbA1C: No results for input(s): HGBA1C in the last 72 hours. CBG: No results for input(s): GLUCAP in the last 168 hours. Lipid Profile: No results for input(s): CHOL, HDL, LDLCALC, TRIG, CHOLHDL, LDLDIRECT in the last 72 hours. Thyroid Function Tests: No results for input(s): TSH, T4TOTAL, FREET4, T3FREE, THYROIDAB in the last 72 hours. Anemia Panel: No results for input(s): VITAMINB12, FOLATE, FERRITIN, TIBC, IRON, RETICCTPCT in the last 72 hours. Sepsis Labs: Recent Labs  Lab 09/14/17 0110  LATICACIDVEN 1.97*    No results found for this or any previous visit (from the past 240 hour(s)).       Radiology Studies: No results found.      Scheduled Meds: . enoxaparin (LOVENOX) injection  40 mg Subcutaneous Q24H  . mouth rinse  15 mL Mouth Rinse BID   Continuous Infusions: . [START ON 09/20/2017] cefoTEtan (CEFOTAN) IV    . famotidine (PEPCID) IV 20 mg (09/19/17 1030)  . sodium chloride 0.45 % 1,000 mL with  potassium chloride 40 mEq infusion 75 mL/hr at 09/18/17 2329     LOS: 5 days    Time spent: over 30 min    Fayrene Helper, MD [facility] Pager [phone]  If 7PM-7AM, please contact night-coverage [email or web address] Password [email or web address] 09/19/2017, 4:54 PM

## 2017-09-19 NOTE — Consult Note (Signed)
[facility] Nurse requested for preoperative stoma site marking  Discussed surgical procedure and stoma creation with patient. Explained role of the [facility] nurse team.  Provided the patient with educational booklet and provided samples of pouching options.  Answered patient's questions.   Examined patient lying and sitting in order to place the marking in the patient's visual field, away from any creases or abdominal contour issues and within the rectus muscle.  Attempted to mark below the patient's belt line, but this was not possible since 2 significant creasees appear when the patient is sitting and should be avoided if possible.   Marked for colostomy in the LLQ  _6___ cm to the left of the umbilicus and __2__BX above the umbilicus.  Marked for ileostomy in the RLQ  __6__cm to the right of the umbilicus and  _4___ cm above the umbilicus.  Patient's abdomen cleansed with CHG wipes at site markings, allowed to air dry prior to marking. Pt plans for surgery tomorrow.  [facility] Nurse team will follow up with patient after surgery for continued ostomy care and teaching.  Julien Girt MSN, RN, [place], [place], [place]

## 2017-09-19 NOTE — Care Management Note (Signed)
Case Management Note  Patient Details  Name: Alex [name] MRN: [id number] Date of Birth: May 05, 1951  Subjective/Objective:    Presents with nausea, vomiting and abdominal distention. Found to have large bowel obstruction, hx of hypertension, hyperlipidemia, stroke, carotid artery stenosis, recent pneumonia and empyema, 4/4 s/p VATS procedure.  Pt without medical insurance, however, per financial counselor patient refuses to apply for Medicare benefits even though he is entitled to benefits based on age.   [name] (Daughter)     [phone]        PCP: Azalee Course          Action/Plan: Plan:OR tomorrow for partial colectomy and colostomy by Dr. Kae Heller....NCM following for disposition needs.   Expected Discharge Date:                  Expected Discharge Plan:  Home/Self Care  In-House Referral:     Discharge planning Services  CM Consult  Post Acute Care Choice:    Choice offered to:     DME Arranged:    DME Agency:     HH Arranged:    HH Agency:     Status of Service:  In process, will continue to follow  If discussed at Long Length of Stay Meetings, dates discussed:    Additional Comments:  Sharin Mons, RN 09/19/2017, 4:16 PM

## 2017-09-19 NOTE — Progress Notes (Signed)
    Progress Note   Subjective  Patient denies abdominal pain, nausea is controlled with NG tube Is aware of plans for sigmoid resection tomorrow with colostomy placement I reviewed pathology results with him which showed adenoma with high-grade dysplasia   Objective   Vital signs in last 24 hours: Temp:  [97.8 F (36.6 C)-98.7 F (37.1 C)] 97.8 F (36.6 C) (04/24 1318) Pulse Rate:  [84-90] 90 (04/24 1318) Resp:  [18-21] 21 (04/24 1318) BP: (131-139)/(80-85) 138/85 (04/24 1318) SpO2:  [95 %-98 %] 98 % (04/24 1318) Last BM Date: 08/29/17 Gen: awake, alert, NAD HEENT: anicteric, NG tube in place CV: RRR, no mrg Pulm: Clear anteriorly bilaterally Abd: Mildly distended though overall soft, hypoactive bowel sounds Ext: no c/c/e Neuro: nonfocal  Intake/Output from previous day: 04/23 0701 - 04/24 0700 In: 600 [NG/GT:600] Out: 1700 [Urine:1150; Emesis/NG output:550] Intake/Output this shift: Total I/O In: -  Out: 650 [Urine:650]  Lab Results: Recent Labs    09/19/17 0501  WBC 9.5  HGB 11.0*  HCT 34.7*  PLT 239   BMET Recent Labs    09/17/17 0610 09/19/17 0501  NA 133* 134*  K 4.1 4.9  CL 102 95*  CO2 22 25  GLUCOSE 78 72  BUN 8 11  CREATININE 0.63 0.83  CALCIUM 8.8* 9.2   LFT Recent Labs    09/17/17 0610  ALBUMIN 3.3*    Studies/Results: No results found.     Assessment / Plan:   67 year old male with large bowel obstruction found to have obstructing sigmoid tumor  1.  Colon obstruction with sigmoid tumor --biopsy results showed adenoma with high-grade dysplasia but I expect this to be a cancer as there is sampling bias as I could not visualize the whole lesion due to obstruction.  I explained this to the patient who understands. --For sigmoid resection tomorrow --Patient is aware of the need for complete colonoscopy, once he has recovered from surgery tomorrow.  Will also need evaluation of rectal stump for polypectomy as there are 2 known polyps  in the rectum and rectosigmoid.  I expect we can perform colonoscopy through the colostomy and via the rectum before any reanastomosis procedure --GI will sign off for now, call with questions     Principal Problem:   Nausea & vomiting Active Problems:   History of stroke   Empyema (HCC)   Benign essential HTN   Abdominal distension   Dehydration   Hypokalemia   HLD (hyperlipidemia)   Nausea and vomiting   Malignant tumor of sigmoid colon (HCC)   Colon obstruction ([place])     LOS: 5 days   Jerene Bears  09/19/2017, 5:49 PM

## 2017-09-19 NOTE — Progress Notes (Signed)
[facility]/Trauma Progress Note  1 Day Post-Op   Assessment/Plan Large bowel obstruction - s/p VATS/decortication 08/30/17 - CT scan 4/19 Colon is filled with liquid/air to the level of the sigmoid. Mild sigmoid wall thickening.  - rectal contrast study 4/22 showed high grade stenosis/obstruction of distal sigmoid. - S/P flex sig, Dr. Hilarie Fredrickson, 04/23 showed Likely malignant completely obstructing tumor in the distal sigmoid colon - path pending  FEN: NPO, IVF, NGT to LIWS VTE: SCD's, lovenox ID: Linezolid per CT surgery Foley: none Follow up: TBD  DISPO: OR tomorrow for partial colectomy and colostomy by Dr. Kae Heller    LOS: 5 days    Subjective: CC: colon mass  Pt denies abdominal pain at this time. He feels his belly is less bloated. No nausea. NGT in place. We discussed surgery tomorrow and he is agreeable. Discussed that path is not completed at this time. No family at bedside.  Objective: Vital signs in last 24 hours: Temp:  [98.1 F (36.7 C)-98.7 F (37.1 C)] 98.1 F (36.7 C) (04/24 0525) Pulse Rate:  [75-90] 84 (04/24 0525) Resp:  [15-21] 19 (04/24 0525) BP: (126-143)/(42-85) 139/80 (04/24 0525) SpO2:  [95 %-100 %] 97 % (04/24 0525) Last BM Date: 08/29/17  Intake/Output from previous day: 04/23 0701 - 04/24 0700 In: 600 [NG/GT:600] Out: 1700 [Urine:1150; Emesis/NG output:550] Intake/Output this shift: No intake/output data recorded.  PE: Gen:  Alert, NAD, pleasant, cooperative HEENT: NGT in place with brown liquid in canister  Pulm:  Rate and effort normal Abd: Soft, no TTP, mild distention, +BS, no HSM Skin: no rashes noted, warm and dry   Anti-infectives: Anti-infectives (From admission, onward)   Start     Dose/Rate Route Frequency Ordered Stop   09/17/17 1245  linezolid (ZYVOX) IVPB 600 mg     600 mg 300 mL/hr over 60 Minutes Intravenous Every 12 hours 09/17/17 1243 09/19/17 0014   09/14/17 1000  linezolid (ZYVOX) tablet 600 mg   Status:  Discontinued     600 mg Oral 2 times daily 09/14/17 0401 09/17/17 1243      Lab Results:  Recent Labs    09/19/17 0501  WBC 9.5  HGB 11.0*  HCT 34.7*  PLT 239   BMET Recent Labs    09/17/17 0610 09/19/17 0501  NA 133* 134*  K 4.1 4.9  CL 102 95*  CO2 22 25  GLUCOSE 78 72  BUN 8 11  CREATININE 0.63 0.83  CALCIUM 8.8* 9.2   PT/INR No results for input(s): LABPROT, INR in the last 72 hours. CMP     Component Value Date/Time   NA 134 (L) 09/19/2017 0501   K 4.9 09/19/2017 0501   CL 95 (L) 09/19/2017 0501   CO2 25 09/19/2017 0501   GLUCOSE 72 09/19/2017 0501   BUN 11 09/19/2017 0501   CREATININE 0.83 09/19/2017 0501   CALCIUM 9.2 09/19/2017 0501   PROT 8.4 (H) 09/13/2017 2127   ALBUMIN 3.3 (L) 09/17/2017 0610   AST 44 (H) 09/13/2017 2127   ALT 48 09/13/2017 2127   ALKPHOS 73 09/13/2017 2127   BILITOT 1.1 09/13/2017 2127   GFRNONAA >60 09/19/2017 0501   GFRAA >60 09/19/2017 0501   Lipase     Component Value Date/Time   LIPASE 37 09/13/2017 2127    Studies/Results: Dg Colon W/water Sol Cm  Result Date: 09/17/2017 CLINICAL DATA:  Abdominal pain. Lack of stool since surgery on August 30 2017 EXAM: COLON WITH WATER SOLUTION CONTRAST COMPARISON:  Multiple  exams, including radiograph from 09/16/2017 and CT abdomen from 09/14/2017 FINDINGS: Initial KUB demonstrates borderline distention of proximal colon and mildly dilated loops of small bowel. Proximally 400 cc of Isovue mixed 3-1 with water was utilized. The rectum appears unremarkable but there is blunt abrupt truncation of the contrast column in the vicinity of the distal sigmoid colon proximally in the region of the transition shown on prior CT. The transition appears to be abrupt and causes a blunted margin of the contrast column. Using low gravity pressure and turning the patient side to side did not allow [place] to extend through this region of narrowing. IMPRESSION: 1. Focal high-grade stenosis/obstruction in  the distal sigmoid colon corresponding to the site of transition shown on prior CT. This could be from a focal stricture or scarring but is not entirely specific. Tumor not readily excluded. The proximal margin of the contrast column appears blunted in this vicinity. We were not able to cause contrast to flow proximal to this obstruction. Electronically Signed   By: Van Clines M.D.   On: 09/17/2017 13:43      Kalman Drape , [facility] Surgery 09/19/2017, 8:20 AM  Pager: [phone] Mon-Wed, Friday 7:00am-4:30pm Thurs 7am-11:30am  Consults: [phone]

## 2017-09-19 NOTE — Progress Notes (Signed)
Attending MD notified via Jacinta Shoe with surgical PA, aware and no knew orders at this time.  Will cont to monitor.  AKingBSNRN

## 2017-09-19 NOTE — Progress Notes (Signed)
Pt experienced rectal bleeding in a moderate amount saturating the pad on the bed and then more loss as he walked to the bathroom. Patient vital signs stable will continue to monitor.

## 2017-09-19 NOTE — Progress Notes (Signed)
Attending MD notified of above via Amion. Spoke with Surgical PA, aware of above, no knew orders at this time, cont to monitor.  AKingBSNRN

## 2017-09-20 ENCOUNTER — Inpatient Hospital Stay (HOSPITAL_COMMUNITY): Payer: Self-pay | Admitting: Anesthesiology

## 2017-09-20 ENCOUNTER — Encounter (HOSPITAL_COMMUNITY)
Admission: EM | Disposition: A | Discharge status: Home Health | Payer: Self-pay | Source: Home / Self Care | Attending: Family Medicine

## 2017-09-20 HISTORY — PX: PARTIAL COLECTOMY: SHX5273

## 2017-09-20 LAB — CBC
HEMATOCRIT: 34.9 % — AB (ref 39.0–52.0)
Hemoglobin: 11.1 g/dL — ABNORMAL LOW (ref 13.0–17.0)
MCH: 26.1 pg (ref 26.0–34.0)
MCHC: 31.8 g/dL (ref 30.0–36.0)
MCV: 81.9 fL (ref 78.0–100.0)
Platelets: 242 10*3/uL (ref 150–400)
RBC: 4.26 MIL/uL (ref 4.22–5.81)
RDW: 15.4 % (ref 11.5–15.5)
WBC: 10.3 10*3/uL (ref 4.0–10.5)

## 2017-09-20 LAB — COMPREHENSIVE METABOLIC PANEL
ALT: 96 U/L — ABNORMAL HIGH (ref 17–63)
ANION GAP: 12 (ref 5–15)
AST: 73 U/L — AB (ref 15–41)
Albumin: 3.5 g/dL (ref 3.5–5.0)
Alkaline Phosphatase: 62 U/L (ref 38–126)
BILIRUBIN TOTAL: 1.3 mg/dL — AB (ref 0.3–1.2)
BUN: 12 mg/dL (ref 6–20)
CHLORIDE: 95 mmol/L — AB (ref 101–111)
CO2: 24 mmol/L (ref 22–32)
Calcium: 9.1 mg/dL (ref 8.9–10.3)
Creatinine, Ser: 0.79 mg/dL (ref 0.61–1.24)
GFR calc Af Amer: >60 mL/min (ref >60)
Glucose, Bld: 73 mg/dL (ref 65–99)
POTASSIUM: 5.6 mmol/L — AB (ref 3.5–5.1)
Sodium: 131 mmol/L — ABNORMAL LOW (ref 135–145)
TOTAL PROTEIN: 6.1 g/dL — AB (ref 6.5–8.1)

## 2017-09-20 LAB — BASIC METABOLIC PANEL
ANION GAP: 17 — AB (ref 5–15)
BUN: 13 mg/dL (ref 6–20)
CALCIUM: 8.5 mg/dL — AB (ref 8.9–10.3)
CO2: 21 mmol/L — ABNORMAL LOW (ref 22–32)
Chloride: 93 mmol/L — ABNORMAL LOW (ref 101–111)
Creatinine, Ser: 0.92 mg/dL (ref 0.61–1.24)
GFR calc Af Amer: >60 mL/min (ref >60)
Glucose, Bld: 105 mg/dL — ABNORMAL HIGH (ref 65–99)
POTASSIUM: 5.5 mmol/L — AB (ref 3.5–5.1)
SODIUM: 131 mmol/L — AB (ref 135–145)

## 2017-09-20 SURGERY — COLECTOMY, PARTIAL
Anesthesia: General | Site: Abdomen

## 2017-09-20 MED ORDER — FENTANYL CITRATE (PF) 250 MCG/5ML IJ SOLN
INTRAMUSCULAR | Status: DC | PRN
  Administered 2017-09-20: 25 ug via INTRAVENOUS
  Administered 2017-09-20 (×3): 50 ug via INTRAVENOUS
  Administered 2017-09-20 (×2): 25 ug via INTRAVENOUS
  Administered 2017-09-20: 150 ug via INTRAVENOUS
  Administered 2017-09-20: 50 ug via INTRAVENOUS

## 2017-09-20 MED ORDER — DEXAMETHASONE SODIUM PHOSPHATE 10 MG/ML IJ SOLN
INTRAMUSCULAR | Status: DC | PRN
  Administered 2017-09-20: 10 mg via INTRAVENOUS

## 2017-09-20 MED ORDER — HYDROMORPHONE HCL 1 MG/ML IJ SOLN
INTRAMUSCULAR | Status: AC
  Filled 2017-09-20: qty 0.5

## 2017-09-20 MED ORDER — MEPERIDINE HCL 50 MG/ML IJ SOLN: 6.2500 mg | INTRAMUSCULAR | Status: DC | PRN

## 2017-09-20 MED ORDER — LIDOCAINE 2% (20 MG/ML) 5 ML SYRINGE
INTRAMUSCULAR | Status: DC | PRN
  Administered 2017-09-20: 20 mg via INTRAVENOUS

## 2017-09-20 MED ORDER — LACTATED RINGERS IV SOLN
INTRAVENOUS | Status: DC
  Administered 2017-09-20 (×2): via INTRAVENOUS

## 2017-09-20 MED ORDER — PROPOFOL 10 MG/ML IV BOLUS
INTRAVENOUS | Status: AC
Start: 2017-09-20 — End: ?
  Filled 2017-09-20: qty 20

## 2017-09-20 MED ORDER — 0.9 % SODIUM CHLORIDE (POUR BTL) OPTIME
TOPICAL | Status: DC | PRN
  Administered 2017-09-20: 2000 mL

## 2017-09-20 MED ORDER — MORPHINE SULFATE 2 MG/ML IV SOLN
INTRAVENOUS | Status: DC
  Administered 2017-09-20: 13:00:00 via INTRAVENOUS
  Administered 2017-09-20: 9.5 mg via INTRAVENOUS
  Administered 2017-09-20: 7.5 mg via INTRAVENOUS
  Administered 2017-09-21: 4.5 mg via INTRAVENOUS
  Administered 2017-09-21: 0 mg via INTRAVENOUS
  Administered 2017-09-21: 7 mg via INTRAVENOUS
  Filled 2017-09-20: qty 30

## 2017-09-20 MED ORDER — FENTANYL CITRATE (PF) 250 MCG/5ML IJ SOLN
INTRAMUSCULAR | Status: AC
  Filled 2017-09-20: qty 5

## 2017-09-20 MED ORDER — MIDAZOLAM HCL 2 MG/2ML IJ SOLN
INTRAMUSCULAR | Status: DC | PRN
  Administered 2017-09-20: 2 mg via INTRAVENOUS

## 2017-09-20 MED ORDER — HYDROMORPHONE HCL 2 MG/ML IJ SOLN
INTRAMUSCULAR | Status: AC
  Filled 2017-09-20: qty 1

## 2017-09-20 MED ORDER — SUGAMMADEX SODIUM 200 MG/2ML IV SOLN
INTRAVENOUS | Status: AC
  Filled 2017-09-20: qty 2

## 2017-09-20 MED ORDER — SODIUM CHLORIDE 0.9% FLUSH: 9.0000 mL | INTRAVENOUS | Status: DC | PRN

## 2017-09-20 MED ORDER — NALOXONE HCL 0.4 MG/ML IJ SOLN: 0.4000 mg | INTRAMUSCULAR | Status: DC | PRN

## 2017-09-20 MED ORDER — DEXTROSE 5 % IV SOLN
INTRAVENOUS | Status: DC | PRN
  Administered 2017-09-20: 25 ug/min via INTRAVENOUS

## 2017-09-20 MED ORDER — ONDANSETRON HCL 4 MG/2ML IJ SOLN
INTRAMUSCULAR | Status: AC
  Filled 2017-09-20: qty 2

## 2017-09-20 MED ORDER — MIDAZOLAM HCL 2 MG/2ML IJ SOLN: 0.5000 mg | Freq: Once | INTRAMUSCULAR | Status: DC | PRN

## 2017-09-20 MED ORDER — PROPOFOL 10 MG/ML IV BOLUS
INTRAVENOUS | Status: DC | PRN
  Administered 2017-09-20: 100 mg via INTRAVENOUS

## 2017-09-20 MED ORDER — ONDANSETRON HCL 4 MG/2ML IJ SOLN: 4.0000 mg | Freq: Four times a day (QID) | INTRAMUSCULAR | Status: DC | PRN

## 2017-09-20 MED ORDER — SODIUM CHLORIDE 0.9 % IV SOLN
INTRAVENOUS | Status: DC
  Administered 2017-09-20: 15:00:00 via INTRAVENOUS
  Administered 2017-09-21: 1 mL via INTRAVENOUS
  Administered 2017-09-22 – 2017-09-24 (×3): via INTRAVENOUS
  Administered 2017-09-25: 50 mL/h via INTRAVENOUS

## 2017-09-20 MED ORDER — ROCURONIUM BROMIDE 10 MG/ML (PF) SYRINGE
PREFILLED_SYRINGE | INTRAVENOUS | Status: DC | PRN
  Administered 2017-09-20: 50 mg via INTRAVENOUS
  Administered 2017-09-20: 20 mg via INTRAVENOUS

## 2017-09-20 MED ORDER — DIPHENHYDRAMINE HCL 12.5 MG/5ML PO ELIX: 12.5000 mg | ORAL_SOLUTION | Freq: Four times a day (QID) | ORAL | Status: DC | PRN

## 2017-09-20 MED ORDER — ROCURONIUM BROMIDE 10 MG/ML (PF) SYRINGE
PREFILLED_SYRINGE | INTRAVENOUS | Status: AC
  Filled 2017-09-20: qty 5

## 2017-09-20 MED ORDER — DEXAMETHASONE SODIUM PHOSPHATE 10 MG/ML IJ SOLN
INTRAMUSCULAR | Status: AC
  Filled 2017-09-20: qty 1

## 2017-09-20 MED ORDER — SUCCINYLCHOLINE CHLORIDE 200 MG/10ML IV SOSY
PREFILLED_SYRINGE | INTRAVENOUS | Status: AC
Start: 2017-09-20 — End: ?
  Filled 2017-09-20: qty 10

## 2017-09-20 MED ORDER — MIDAZOLAM HCL 2 MG/2ML IJ SOLN
INTRAMUSCULAR | Status: AC
  Filled 2017-09-20: qty 2

## 2017-09-20 MED ORDER — HYDROMORPHONE HCL 2 MG/ML IJ SOLN
0.2500 mg | INTRAMUSCULAR | Status: DC | PRN
  Administered 2017-09-20 (×4): 0.5 mg via INTRAVENOUS

## 2017-09-20 MED ORDER — LIDOCAINE 2% (20 MG/ML) 5 ML SYRINGE
INTRAMUSCULAR | Status: AC
  Filled 2017-09-20: qty 5

## 2017-09-20 MED ORDER — HYDROMORPHONE HCL 1 MG/ML IJ SOLN
INTRAMUSCULAR | Status: DC | PRN
  Administered 2017-09-20 (×2): .5 mg via INTRAVENOUS

## 2017-09-20 MED ORDER — SUGAMMADEX SODIUM 200 MG/2ML IV SOLN
INTRAVENOUS | Status: DC | PRN
  Administered 2017-09-20: 200 mg via INTRAVENOUS

## 2017-09-20 MED ORDER — PROMETHAZINE HCL 25 MG/ML IJ SOLN: 6.2500 mg | INTRAMUSCULAR | Status: DC | PRN

## 2017-09-20 MED ORDER — DIPHENHYDRAMINE HCL 50 MG/ML IJ SOLN: 12.5000 mg | Freq: Four times a day (QID) | INTRAMUSCULAR | Status: DC | PRN

## 2017-09-20 MED ORDER — SODIUM CHLORIDE 0.9 % IV SOLN: 2.0000 g | INTRAVENOUS | Status: DC

## 2017-09-20 SURGICAL SUPPLY — 45 items
BLADE CLIPPER SURG (BLADE) IMPLANT
BRR ADH 5X3 SEPRAFILM 6 SHT (MISCELLANEOUS) ×1
CANISTER SUCT 3000ML PPV (MISCELLANEOUS) ×3 IMPLANT
CHLORAPREP W/TINT 26ML (MISCELLANEOUS) ×3 IMPLANT
COVER SURGICAL LIGHT HANDLE (MISCELLANEOUS) ×3 IMPLANT
DRAPE LAPAROSCOPIC ABDOMINAL (DRAPES) ×3 IMPLANT
DRAPE WARM FLUID 44X44 (DRAPE) ×3 IMPLANT
DRSG OPSITE POSTOP 4X10 (GAUZE/BANDAGES/DRESSINGS) ×3 IMPLANT
DRSG OPSITE POSTOP 4X8 (GAUZE/BANDAGES/DRESSINGS) IMPLANT
ELECT BLADE 6.5 EXT (BLADE) IMPLANT
ELECT CAUTERY BLADE 6.4 (BLADE) ×3 IMPLANT
ELECT REM PT RETURN 9FT ADLT (ELECTROSURGICAL) ×3
ELECTRODE REM PT RTRN 9FT ADLT (ELECTROSURGICAL) ×1 IMPLANT
GLOVE BIO SURGEON STRL SZ 6 (GLOVE) ×3 IMPLANT
GLOVE BIOGEL PI IND STRL 6.5 (GLOVE) ×1 IMPLANT
GLOVE BIOGEL PI IND STRL 7.0 (GLOVE) ×2 IMPLANT
GLOVE BIOGEL PI INDICATOR 6.5 (GLOVE) ×2
GLOVE BIOGEL PI INDICATOR 7.0 (GLOVE) ×4
GLOVE SURG SS PI 6.5 STRL IVOR (GLOVE) ×6 IMPLANT
GOWN STRL REUS W/ TWL LRG LVL3 (GOWN DISPOSABLE) ×2 IMPLANT
GOWN STRL REUS W/TWL LRG LVL3 (GOWN DISPOSABLE) ×6
KIT BASIN OR (CUSTOM PROCEDURE TRAY) ×3 IMPLANT
KIT OSTOMY DRAINABLE 2.75 STR (WOUND CARE) ×3 IMPLANT
KIT TURNOVER KIT B (KITS) ×3 IMPLANT
LIGASURE IMPACT 36 18CM CVD LR (INSTRUMENTS) IMPLANT
NS IRRIG 1000ML POUR BTL (IV SOLUTION) ×6 IMPLANT
PACK GENERAL/GYN (CUSTOM PROCEDURE TRAY) ×3 IMPLANT
PAD ARMBOARD 7.5X6 YLW CONV (MISCELLANEOUS) ×3 IMPLANT
RELOAD PROXIMATE 75MM BLUE (ENDOMECHANICALS) ×3 IMPLANT
SEPRAFILM PROCEDURAL PACK 3X5 (MISCELLANEOUS) ×3 IMPLANT
SPECIMEN JAR LARGE (MISCELLANEOUS) IMPLANT
SPONGE LAP 18X18 X RAY DECT (DISPOSABLE) IMPLANT
STAPLER CUT CVD 40MM GREEN (STAPLE) ×3 IMPLANT
STAPLER PROXIMATE 75MM BLUE (STAPLE) ×3 IMPLANT
STAPLER VISISTAT 35W (STAPLE) ×3 IMPLANT
SUCTION POOLE TIP (SUCTIONS) ×3 IMPLANT
SUT PDS AB 1 TP1 54 (SUTURE) ×6 IMPLANT
SUT PDS AB 1 TP1 96 (SUTURE) ×6 IMPLANT
SUT SILK 2 0 SH CR/8 (SUTURE) ×3 IMPLANT
SUT SILK 2 0 TIES 10X30 (SUTURE) ×3 IMPLANT
SUT SILK 3 0 SH CR/8 (SUTURE) ×3 IMPLANT
SUT SILK 3 0 TIES 10X30 (SUTURE) ×3 IMPLANT
SUT VIC AB 3-0 SH 18 (SUTURE) ×6 IMPLANT
TOWEL OR 17X26 10 PK STRL BLUE (TOWEL DISPOSABLE) ×3 IMPLANT
TRAY FOLEY W/METER SILVER 16FR (SET/KITS/TRAYS/PACK) IMPLANT

## 2017-09-20 NOTE — Progress Notes (Signed)
[facility]/Tra[facility] Progress Note  Day of Surgery   Assessment/Plan Large bowel obstruction - s/p VATS/decortication 08/30/17 - CT scan 4/19 Colon is filled with liquid/air to the level of the sigmoid. Mild sigmoid wall thickening.  - rectal contrast study 4/22 showed high grade stenosis/obstruction of distal sigmoid. - S/P flex sig, Dr. 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 Hilarie Fredrickson expected to be malignant   FEN: NPO, IVF, NGT to LIWS VTE: SCD's, lovenox ID: Linezolid per CT surgery Foley: none Follow up: TBD  DISPO: OR this morning for partial colectomy with colostomy. We've discussed previously the risks of surgery and anticipated postoperative course. His sister is at the bedside within this morning. All their questions have been answered to their satisfaction. We'll proceed as planned.    LOS: 6 days    Subjective: CC: colon mass  Denies abdominal pain, eager to proceed with surgery  Objective: Vital signs in last 24 hours: Temp:  [97.8 F (36.6 C)-98.3 F (36.8 C)] 98.2 F (36.8 C) (04/25 0525) Pulse Rate:  [77-90] 77 (04/25 0525) Resp:  [19-21] 19 (04/24 2015) BP: (122-138)/(81-86) 122/86 (04/25 0525) SpO2:  [97 %-98 %] 97 % (04/25 0525) Last BM Date: 08/29/17  Intake/Output from previous day: 04/24 0701 - 04/25 0700 In: 1040 [I.V.:940; IV Piggyback:100] Out: 1275 [Urine:1225; Emesis/NG output:50] Intake/Output this shift: No intake/output data recorded.  PE: Gen:  Alert, NAD, pleasant, cooperative HEENT: NGT in place with brown liquid in canister  Pulm:  Rate and effort normal Abd: Soft, no TTP, mild distention, +BS, no HSM Skin: no rashes noted, warm and dry   Anti-infectives: Anti-infectives (From admission, onward)   Start     Dose/Rate Route Frequency Ordered Stop   09/20/17 0730  [MAR  Hold]  cefoTEtan (CEFOTAN) 2 g in sodium chloride 0.9 % 100 mL IVPB     (MAR Hold since Thu 09/20/2017 at 0749. Reason: Transfer to a Procedural area.)   2 g 200 mL/hr over 30 Minutes Intravenous 30 min pre-op 09/19/17 1156     09/19/17 1145  cefoTEtan (CEFOTAN) 2 g in sodium chloride 0.9 % 100 mL IVPB  Status:  Discontinued     2 g 200 mL/hr over 30 Minutes Intravenous On call to O.R. 09/19/17 1144 09/19/17 1156   09/17/17 1245  linezolid (ZYVOX) IVPB 600 mg     600 mg 300 mL/hr over 60 Minutes Intravenous Every 12 hours 09/17/17 1243 09/19/17 0014   09/14/17 1000  linezolid (ZYVOX) tablet 600 mg  Status:  Discontinued     600 mg Oral 2 times daily 09/14/17 0401 09/17/17 1243      Lab Results:  Recent Labs    09/19/17 0501 09/19/17 1801 09/20/17 0647  WBC 9.5  --  10.3  HGB 11.0* 10.8* 11.1*  HCT 34.7* 34.2* 34.9*  PLT 239  --  242   BMET Recent Labs    09/19/17 0501 09/20/17 0647  NA 134* 131*  K 4.9 5.6*  CL 95* 95*  CO2 25 24  GLUCOSE 72 73  BUN 11 12  CREATININE 0.83 0.79  CALCIUM 9.2 9.1   PT/INR No results for input(s): LABPROT, INR in the last 72 hours. CMP     Component Value Date/Time   NA 131 (L) 09/20/2017 0647   K 5.6 (H) 09/20/2017 0647   CL 95 (L) 09/20/2017 0647   CO2 24 09/20/2017  1282   GLUCOSE 73 09/20/2017 0647   BUN 12 09/20/2017 0647   CREATININE 0.79 09/20/2017 0647   CALCIUM 9.1 09/20/2017 0647   PROT 6.1 (L) 09/20/2017 0647   ALBUMIN 3.5 09/20/2017 0647   AST 73 (H) 09/20/2017 0647   ALT 96 (H) 09/20/2017 0647   ALKPHOS 62 09/20/2017 0647   BILITOT 1.3 (H) 09/20/2017 0647   GFRNONAA >60 09/20/2017 0647   GFRAA >60 09/20/2017 0647   Lipase     Component Value Date/Time   LIPASE 37 09/13/2017 2127    Studies/Results: No results found.    Clovis Riley , [facility] 09/20/2017, 8:13 AM

## 2017-09-20 NOTE — Progress Notes (Signed)
Notified Dr. Glennon Mac of elevated potassium and liver enzymes.

## 2017-09-20 NOTE — Op Note (Addendum)
Operative Note  ASIM GERSTEN  [id number]  [id number]  09/20/2017   Surgeon: Vikki Ports A ConnorMD  Assistant: Brigid Re PA-C  Procedure performed: expiratory laparotomy, and partial colectomy (rectosigmoid) with end colostomy  Preop diagnosis: obstructing tumor Post-op diagnosis/intraop findings: same, no evidence of metastatic disease  Specimens: partial colectomy (rectosigmoid) Retained items: none EBL: minimalcc Complications: none  Description of procedure: After obtaining informed consent the patient was taken to the operating room and placed supine on operating room table wheregeneral endotracheal anesthesia was initiated, preoperative antibiotics were administered, SCDs applied, and a formal timeout was performed. The abdomen was prepped and draped in usual sterile fashion. A midline laparotomy was created sharply and dissection carried down with cautery until linea alba could be identified and divided. The perineal cavity was entered sharply and the incision was completed. The bowel was noted to be grossly distended. The liver was inspected with no evidence of metastatic disease. The tumor was palpable just proximal to the rectosigmoid junction and the tattoo placed distally was visualized. The sigmoid colon was medialized by dividing the white line of Toldt. We stayed out of the retroperitoneum with this dissection. I attempted to visualize the ureterbut due to the patient's significant obesity were not able to directly visualize the ureter although it was palpable deep to our plane of dissection. The peritoneum was scored on either side of the rectum and blunt dissection used to create a window posterior to the rectum. The contour green stapler was introduced in the rectum was transected at the level of the tattoo, which was about 5 cm from the palpable tumor.  A site was chosen on the sigmoid colon that would reach easily up to the abdominal wall. A window was made in the  mesentery and the colon was divided with a blue load linear cutting stapler. The LigaSure was then used to divide the intervening mesentery. The internal mesenteric artery was oversewn with a figure-of-eight suture of 3-0 silk. The specimen was passed off and the field was inspected for hemostasis which was appropriate. The bowel was run from the ligament of Treitz to the terminal ileum and no other obstructing lesions were found nor any other abnormalities. A colostomy site was created in the left upper quadrant at the previously marked location using cautery to excise the skin and a core of subcutaneous fat. The fascia was divided in a cruciate fashion, the rectus muscle split and the posterior fascia divided with cautery. This was dilated to accommodate 3 fingers. The end of the colon was then brought up through this wound and held in place with a Babcock. The colon was inspected and confirmed to be free of any twisting. The abdomen was copiously irrigated and hemostasis once again confirmed. The rectal stump was marked with two 2-0 prolene sutures.  Seprafilm was placed over the rectal stump, around the stoma and then after bringing the omentum back down over the small bowel, Seprafilm was placed on top of this. The midline was closed with a running #1 single strand PDS in the fascia starting on either end and tying centrally. The wound was irrigated and the skin was closed with staples and covered with a dressing. We then matured the colostomy in standard fashion. The stoma was digitized and confirmed to be patent with efflux of stool. An ostomy appliance was placed. The patient was then awakened, extubated and taken to PACU in stable condition.   All counts were correct at the completion of the case.

## 2017-09-20 NOTE — Anesthesia Procedure Notes (Addendum)
Procedure Name: Intubation Date/Time: 09/20/2017 9:03 AM Performed by: Annye Asa, MD Pre-anesthesia Checklist: Patient identified, Emergency Drugs available, Suction available, Patient being monitored and Timeout performed Patient Re-evaluated:Patient Re-evaluated prior to induction Oxygen Delivery Method: Circle system utilized Preoxygenation: Pre-oxygenation with 100% oxygen Induction Type: IV induction, Rapid sequence and Cricoid Pressure applied Laryngoscope Size: Mac and 4 Grade View: Grade I Tube type: Oral Tube size: 7.5 mm Number of attempts: 1 Airway Equipment and Method: Patient positioned with wedge pillow and Stylet Placement Confirmation: ETT inserted through vocal cords under direct vision,  positive ETCO2 and breath sounds checked- equal and bilateral Secured at: 22 cm Tube secured with: Tape Dental Injury: Teeth and Oropharynx as per pre-operative assessment  Comments: ETT placed by Chrissie Noa

## 2017-09-20 NOTE — Anesthesia Postprocedure Evaluation (Signed)
Anesthesia Post Note  Patient: Alex [name]  Procedure(s) Performed: OPEN PARTIAL COLECTOMY WITH COLOSTOMY (N/A Abdomen)     Patient location during evaluation: PACU Anesthesia Type: General Level of consciousness: awake and alert, oriented and patient cooperative Pain management: pain level controlled Vital Signs Assessment: post-procedure vital signs reviewed and stable Respiratory status: spontaneous breathing, nonlabored ventilation, respiratory function stable and patient connected to nasal cannula oxygen Cardiovascular status: blood pressure returned to baseline and stable Postop Assessment: no apparent nausea or vomiting Anesthetic complications: no    Last Vitals:  Vitals:   09/20/17 1330 09/20/17 1410  BP: 110/70 116/74  Pulse: (!) 108 (!) 111  Resp: 13 16  Temp: 37.1 C 37 C  SpO2: 94% 96%    Last Pain:  Vitals:   09/20/17 1410  TempSrc: Oral  PainSc:                  Francella Barnett,E. Zoua Caporaso

## 2017-09-20 NOTE — Progress Notes (Signed)
PROGRESS NOTE    Alex [name]  DDU:[id number] DOB: Nov 26, 1950 DOA: 09/13/2017 PCP: Alex Jewel, MD   Brief Narrative:  67 year old Caucasian male with past medical history significant for hypertension, hyperlipidemia, stroke, carotid artery stenosis, who was recently in the hospital for pneumonia and empyema and underwent surgical management. He presented with nausea, vomiting, abdominal distention as well as hypokalemia.Abdominal imaging is indicated possible ileus, although with this worsening abdominal distention, vomiting and lack of bowel movement/flatus, there is concern that he is progressing to Alex [name] bowel obstruction. NG tube placed. General surgery consulted.  Assessment & Plan:   Principal Problem:   Nausea & vomiting Active Problems:   History of stroke   Empyema (HCC)   Benign essential HTN   Abdominal distension   Dehydration   Hypokalemia   HLD (hyperlipidemia)   Nausea and vomiting   Malignant tumor of sigmoid colon (HCC)   Colon obstruction (HCC)   Nausea and vomiting secondary to large bowel obstruction  -Patient presents with nausea and vomiting, had some relief after NGT insertion CT abdomen pelvis on admission showing normal stomach and small bowels, and colon filled with liquid/air at the level of the sigmoid with mild sigmoid wall thickening, rectal contrast 4/22 showing high grade stenosis/obstruction of the distal sigmoid, with no contrast passing proximally. -GI input greatly appreciated, flex sig with likely malignant obstructing tumor in sigmoid colon - Appreciate surgery c/s.   - s/p ex lap, partial colectomy with end colostomy on 4/25 with gen surgery - Post op care per gen surg, pt currently with NG.  PCA.  - Will need full colonoscopy as an outpatient to remove rectosigmoid and rectal polyps and screen unexamined colon  Bright Red Blood Per Rectum:  Pt hemodynamically stable.  Sounds like it resolved yesterday.  Likely related to biopsy of  lesion from during sigmoidoscopy.  Will continue to monitor for now.  Holding lovenox for now, will touch base with surgery to plan to restart tomorrow.  H/H stable.  Was unable to discuss with GI yesterday, but suspect due to bx and now pt is post op.  Ctm.   Recent [facility]x of[facility]  - s/p decortication. No chest pain, cough, shortness rest, fever. Patient was started on linezolid during his last admission and antibiotics were continued until 4/23.   History of stroke:  -Resume aspirin and Zocor when able to take oral   HLD:  - Zocor when able to take oral  Hypokalemia  Hyperkalemia: Pt now hyperkalemia.  D/c K in IVF.    HTN: - blood pressure stable. hold HCTZ . IV hydralazine prn  Tachycardia: pt mildly tachy postop.  Will continue IVF.  Check EKG.   Elevated LFTs: continue to follow  DVT prophylaxis: lovenox Code Status: full  Family Communication: none at bedside Disposition Plan: pending surgery and further improvement   Consultants:   GI  Surgery  Procedures:  4/23 Flex Sig - Likely malignant completely obstructing tumor in the distal sigmoid colon. Biopsied. Tattooed. - Two 5 to 12 mm polyps in the rectum and at the recto-sigmoid colon. Resection not Attempted. - Internal hemorrhoids. - Return patient to hospital ward for ongoing care. - Await pathology results. - NPO given colonic obstruction. - Surgical management of obstructing sigmoid cancer. - Full colonoscopy is recommended as an outpatient after recovery from colon resection to remove recto-sigmoid and rectal polyps and screening the unexamined Colon.  4/25 ex lap and partial colectomy with end colostomy with surgery  Antimicrobials:  Anti-infectives (  From admission, onward)   Start     Dose/Rate Route Frequency Ordered Stop   09/20/17 1702  cefoTEtan (CEFOTAN) 2 g in sodium chloride 0.9 % 100 mL IVPB  Status:  Discontinued     2 g 200 mL/hr over 30 Minutes Intravenous 30 min pre-op  09/20/17 1702 09/20/17 1704   09/20/17 0730  cefoTEtan (CEFOTAN) 2 g in sodium chloride 0.9 % 100 mL IVPB     2 g 200 mL/hr over 30 Minutes Intravenous 30 min pre-op 09/19/17 1156 09/20/17 0936   09/19/17 1145  cefoTEtan (CEFOTAN) 2 g in sodium chloride 0.9 % 100 mL IVPB  Status:  Discontinued     2 g 200 mL/hr over 30 Minutes Intravenous On call to O.R. 09/19/17 1144 09/19/17 1156   09/17/17 1245  linezolid (ZYVOX) IVPB 600 mg     600 mg 300 mL/hr over 60 Minutes Intravenous Every 12 hours 09/17/17 1243 09/19/17 0014   09/14/17 1000  linezolid (ZYVOX) tablet 600 mg  Status:  Discontinued     600 mg Oral 2 times daily 09/14/17 0401 09/17/17 1243     Subjective: Doing ok post op.  Pain. But using PCA. No CP or SOB.  Objective: Vitals:   09/20/17 1330 09/20/17 1410 09/20/17 1554 09/20/17 2007  BP: 110/70 116/74    Pulse: (!) 108 (!) 111    Resp: 13 16 13 15   Temp: 98.7 F (37.1 C) 98.6 F (37 C)    TempSrc:  Oral    SpO2: 94% 96% 93% 94%  Weight:      Height:        Intake/Output Summary (Last 24 hours) at 09/20/2017 2010 Last data filed at 09/20/2017 1330 Gross per 24 hour  Intake 2540 ml  Output 1155 ml  Net 1385 ml   Filed Weights   09/14/17 0400 09/14/17 1504  Weight: 78.5 kg (173 lb 1 oz) 75.9 kg (167 lb 5.3 oz)    Examination:  General: No acute distress. Cardiovascular: Heart sounds show Alex [name] regular rate, and rhythm. No gallops or rubs. No murmurs. No JVD. Lungs: Clear to auscultation bilaterally with good air movement. No rales, rhonchi or wheezes. Abdomen: Midline incision with dressing.  Colostomy on L with stool.  NG in place.  Appropriately ttp. Neurological: Alert and oriented 3. Moves all extremities 4. Cranial nerves II through XII grossly intact. Skin: Warm and dry. No rashes or lesions. Extremities: No clubbing or cyanosis. No edema. Psychiatric: Mood and affect are normal. Insight and judgment are appropriate.      Data Reviewed: I have  personally reviewed following labs and imaging studies  CBC: Recent Labs  Lab 09/13/17 2127 09/14/17 0418 09/15/17 0627 09/19/17 0501 09/19/17 1801 09/20/17 0647  WBC 12.2* 11.3* 7.4 9.5  --  10.3  NEUTROABS  --   --  5.2  --   --   --   HGB 13.5 11.7* 10.4* 11.0* 10.8* 11.1*  HCT 40.3 36.1* 33.2* 34.7* 34.2* 34.9*  MCV 81.4 81.1 82.8 81.5  --  81.9  PLT 427* 318 224 239  --  557   Basic Metabolic Panel: Recent Labs  Lab 09/14/17 0418 09/15/17 0627 09/16/17 0922 09/17/17 0610 09/19/17 0501 09/20/17 0647 09/20/17 1442  NA 133* 136 137  136 133* 134* 131* 131*  K 2.8* 3.1* 3.8  3.7 4.1 4.9 5.6* 5.5*  CL 94* 102 105  103 102 95* 95* 93*  CO2 26 25 25  25 22 25 24  21*  GLUCOSE 113* 107* 106*  104* 78 72 73 105*  BUN 24* 14 9  9 8 11 12 13   CREATININE 0.70 0.70 0.69  0.71 0.63 0.83 0.79 0.92  CALCIUM 8.7* 8.3* 8.7*  8.7* 8.8* 9.2 9.1 8.5*  MG 2.2 1.8  --   --   --   --   --   PHOS  --  2.2* 2.5 2.9  --   --   --    GFR: Estimated Creatinine Clearance: 71.3 mL/min (by C-G formula based on SCr of 0.92 mg/dL). Liver Function Tests: Recent Labs  Lab 09/13/17 2127 09/15/17 0627 09/16/17 0922 09/17/17 0610 09/20/17 0647  AST 44*  --   --   --  73*  ALT 48  --   --   --  96*  ALKPHOS 73  --   --   --  62  BILITOT 1.1  --   --   --  1.3*  PROT 8.4*  --   --   --  6.1*  ALBUMIN 4.2 3.1* 3.2* 3.3* 3.5   Recent Labs  Lab 09/13/17 2127  LIPASE 37   No results for input(s): AMMONIA in the last 168 hours. Coagulation Profile: No results for input(s): INR, PROTIME in the last 168 hours. Cardiac Enzymes: No results for input(s): CKTOTAL, CKMB, CKMBINDEX, TROPONINI in the last 168 hours. BNP (last 3 results) No results for input(s): PROBNP in the last 8760 hours. HbA1C: No results for input(s): HGBA1C in the last 72 hours. CBG: No results for input(s): GLUCAP in the last 168 hours. Lipid Profile: No results for input(s): CHOL, HDL, LDLCALC, TRIG, CHOLHDL,  LDLDIRECT in the last 72 hours. Thyroid Function Tests: No results for input(s): TSH, T4TOTAL, FREET4, T3FREE, THYROIDAB in the last 72 hours. Anemia Panel: No results for input(s): VITAMINB12, FOLATE, FERRITIN, TIBC, IRON, RETICCTPCT in the last 72 hours. Sepsis Labs: Recent Labs  Lab 09/14/17 0110  LATICACIDVEN 1.97*    Recent Results (from the past 240 hour(s))  Surgical pcr screen     Status: Abnormal   Collection Time: 09/19/17  9:07 PM  Result Value Ref Range Status   MRSA, PCR POSITIVE (Kolbi Altadonna) NEGATIVE Final    Comment: RESULT CALLED TO, READ BACK BY AND VERIFIED WITH: Isidoro Donning RN 09/19/17 17616 JDW    Staphylococcus aureus POSITIVE (Lancer Thurner) NEGATIVE Final    Comment: (NOTE) The Xpert SA Assay (FDA approved for NASAL specimens in patients 6 years of age and older), is one component of [facility] Lewelling comprehensive surveillance program. It is not intended to diagnose infection nor to guide or monitor treatment.          Radiology Studies: No results found.      Scheduled Meds: . Chlorhexidine Gluconate Cloth  6 each Topical Q0600  . HYDROmorphone      . mouth rinse  15 mL Mouth Rinse BID  . morphine   Intravenous Q4H  . mupirocin ointment  1 application Nasal BID   Continuous Infusions: . sodium chloride 75 mL/hr at 09/20/17 1459  . famotidine (PEPCID) IV Stopped (09/19/17 2132)     LOS: 6 days    Time spent: over 30 min    Fayrene Helper, MD [facility] Pager [phone]  If 7PM-7AM, please contact night-coverage [email or web address] Password [email or web address] 09/20/2017, 8:10 PM

## 2017-09-20 NOTE — Progress Notes (Signed)
Back from PACU via bed with IVF in situ, Foley catheter draining well, Honeycomb dressing and colostomy bag intact.

## 2017-09-20 NOTE — Progress Notes (Signed)
To OR via bed for partial colectomy and colostomy

## 2017-09-20 NOTE — Anesthesia Preprocedure Evaluation (Addendum)
Anesthesia Evaluation  Patient identified by MRN, date of birth, ID band Patient awake    Reviewed: Allergy & Precautions, NPO status , Patient's Chart, lab work & pertinent test results  History of Anesthesia Complications Negative for: history of anesthetic complications  Airway Mallampati: II  TM Distance: >3 FB Neck ROM: Full    Dental  (+) Poor Dentition, Dental Advisory Given   Pulmonary  Recent VATs for empyema, chest tubes removed 09/03/17   breath sounds clear to auscultation       Cardiovascular hypertension, Pt. on medications (-) angina+ Peripheral Vascular Disease   Rhythm:Regular Rate:Normal  08/27/17 ECHO: EF 65-70%, mild AI   Neuro/Psych CVA, No Residual Symptoms    GI/Hepatic (+)     substance abuse  marijuana use, Elevated LFTs Large bowel obstruction   Endo/Other  negative endocrine ROS  Renal/GU negative Renal ROS     Musculoskeletal   Abdominal   Peds  Hematology  (+) Blood dyscrasia (Hb 11.1), anemia ,   Anesthesia Other Findings   Reproductive/Obstetrics                            Anesthesia Physical Anesthesia Plan  ASA: III  Anesthesia Plan: General   Post-op Pain Management:    Induction: Intravenous and Rapid sequence  PONV Risk Score and Plan: 3 and Ondansetron and Dexamethasone  Airway Management Planned: Oral ETT  Additional Equipment:   Intra-op Plan:   Post-operative Plan: Extubation in OR  Informed Consent: I have reviewed the patients History and Physical, chart, labs and discussed the procedure including the risks, benefits and alternatives for the proposed anesthesia with the patient or authorized representative who has indicated his/her understanding and acceptance.   Dental advisory given  Plan Discussed with: CRNA and Surgeon  Anesthesia Plan Comments: (Plan routine monitors, GETA)        Anesthesia Quick Evaluation

## 2017-09-20 NOTE — Transfer of Care (Signed)
Immediate Anesthesia Transfer of Care Note  Patient: Alex [name]  Procedure(s) Performed: OPEN PARTIAL COLECTOMY WITH COLOSTOMY (N/A Abdomen)  Patient Location: PACU  Anesthesia Type:General  Level of Consciousness: awake, alert  and patient cooperative  Airway & Oxygen Therapy: Patient Spontanous Breathing  Post-op Assessment: Report given to RN and Post -op Vital signs reviewed and stable  Post vital signs: Reviewed and stable  Last Vitals:  Vitals Value Taken Time  BP    Temp    Pulse 105 09/20/2017 11:30 AM  Resp 18 09/20/2017 11:30 AM  SpO2 92 % 09/20/2017 11:30 AM  Vitals shown include unvalidated device data.  Last Pain:  Vitals:   09/20/17 0600  TempSrc:   PainSc: 0-No pain      Patients Stated Pain Goal: 2 (86/76/19 5093)  Complications: No apparent anesthesia complications

## 2017-09-21 ENCOUNTER — Other Ambulatory Visit: Payer: Self-pay

## 2017-09-21 ENCOUNTER — Encounter (HOSPITAL_COMMUNITY): Payer: Self-pay | Admitting: Surgery

## 2017-09-21 LAB — COMPREHENSIVE METABOLIC PANEL
ALBUMIN: 3 g/dL — AB (ref 3.5–5.0)
ALK PHOS: 52 U/L (ref 38–126)
ALT: 93 U/L — ABNORMAL HIGH (ref 17–63)
ANION GAP: 15 (ref 5–15)
AST: 64 U/L — ABNORMAL HIGH (ref 15–41)
BILIRUBIN TOTAL: 1.5 mg/dL — AB (ref 0.3–1.2)
BUN: 16 mg/dL (ref 6–20)
CALCIUM: 8.4 mg/dL — AB (ref 8.9–10.3)
CO2: 25 mmol/L (ref 22–32)
Chloride: 93 mmol/L — ABNORMAL LOW (ref 101–111)
Creatinine, Ser: 0.88 mg/dL (ref 0.61–1.24)
GFR calc Af Amer: >60 mL/min (ref >60)
GLUCOSE: 101 mg/dL — AB (ref 65–99)
Potassium: 5.8 mmol/L — ABNORMAL HIGH (ref 3.5–5.1)
Sodium: 133 mmol/L — ABNORMAL LOW (ref 135–145)
TOTAL PROTEIN: 5.6 g/dL — AB (ref 6.5–8.1)

## 2017-09-21 LAB — MAGNESIUM: MAGNESIUM: 1.9 mg/dL (ref 1.7–2.4)

## 2017-09-21 LAB — CBC
HEMATOCRIT: 33.8 % — AB (ref 39.0–52.0)
HEMOGLOBIN: 10.8 g/dL — AB (ref 13.0–17.0)
MCH: 26.3 pg (ref 26.0–34.0)
MCHC: 32 g/dL (ref 30.0–36.0)
MCV: 82.2 fL (ref 78.0–100.0)
Platelets: 289 10*3/uL (ref 150–400)
RBC: 4.11 MIL/uL — ABNORMAL LOW (ref 4.22–5.81)
RDW: 16 % — AB (ref 11.5–15.5)
WBC: 8.9 10*3/uL (ref 4.0–10.5)

## 2017-09-21 LAB — POTASSIUM: POTASSIUM: 4.5 mmol/L (ref 3.5–5.1)

## 2017-09-21 MED ORDER — ENOXAPARIN SODIUM 40 MG/0.4ML ~~LOC~~ SOLN
40.0000 mg | SUBCUTANEOUS | Status: DC
  Administered 2017-09-21 – 2017-09-22 (×2): 40 mg via SUBCUTANEOUS
  Filled 2017-09-21 (×2): qty 0.4

## 2017-09-21 MED ORDER — MORPHINE SULFATE (PF) 4 MG/ML IV SOLN
2.0000 mg | INTRAVENOUS | Status: DC | PRN
  Administered 2017-09-21 (×2): 4 mg via INTRAVENOUS
  Filled 2017-09-21 (×3): qty 1

## 2017-09-21 MED ORDER — OXYCODONE HCL 5 MG PO TABS
5.0000 mg | ORAL_TABLET | ORAL | Status: DC | PRN
  Administered 2017-09-22 – 2017-09-24 (×4): 5 mg via ORAL
  Filled 2017-09-21 (×5): qty 1

## 2017-09-21 MED ORDER — METHOCARBAMOL 1000 MG/10ML IJ SOLN: 500.0000 mg | Freq: Four times a day (QID) | INTRAMUSCULAR | Status: DC | PRN

## 2017-09-21 NOTE — Progress Notes (Signed)
Pt PCA Morphine was discontinued with 7 ml wasted in the sink with witness Derald Macleod, RN.

## 2017-09-21 NOTE — Consult Note (Addendum)
[place] Nurse ostomy follow up Pt had colostomy surgery yesterday, on 4/25 Stoma type/location:  Stoma red and viable, above skin level, slightly edematous Stomal assessment/size: 2 inches Peristomal assessment:  Intact skin surrounding Output: Small amt semiformed brown stool in the pouch  Ostomy pouching: 1pc.  Education provided:  Demonstrated pouch change procedure to patient.  He watched and asked appropriate questions.  He was able to open and close velcro to empty.  Discussed ordering supplies and pouching routines. Educational materials left at bedside and extra supplies ordered to the room for staff nurse use PRN if leaking.  [place] team will continue to follow for another teaching session on Monday. Enrolled patient in [place] Discharge program: No Julien Girt MSN, [place], [place]ome Sam, [place]

## 2017-09-21 NOTE — Progress Notes (Signed)
PT Cancellation Note  Patient Details Name: [name] MRN: [id number] DOB: [birth year]   Cancelled Treatment:    Reason Eval/Treat Not Completed: Medical issues which prohibited therapy K+ 5.8 and outside of safe parameters per [facility] PT guidelines; hold PT for today, plan to return on next day of service as able and if patient is medically appropriate.    Deniece Ree PT, DPT, CBIS  Supplemental Physical Therapist [facility]   Pager [phone]

## 2017-09-21 NOTE — Progress Notes (Signed)
[facility]/Trauma Progress Note  1 Day Post-Op   Assessment/Plan Large bowel obstruction - s/p VATS/decortication 08/30/17 - CT scan 4/19 Colon is filled with liquid/air to the level of the sigmoid. Mild sigmoid wall thickening. - rectal contrast study 4/22 showed high grade stenosis/obstruction of distal sigmoid. - S/P flex sig, Dr. 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 Hilarie Fredrickson expected to be malignant - S/P expiratory laparotomy, and partial colectomy (rectosigmoid) with end colostomy, Dr. Kae Heller, 04/26  FEN: IVF, NGT clamped, allow clears VTE: SCD's, lovenox ID: Linezolid per CT surgery, Cefotetan Pre-op Foley: none Follow up: TBD  DISPO: stool output in bag, NG tube output 900 mL in last 24 hours, will clamp tube and allow clears to see if patient can tolerate before pulling tube. DC PCA    LOS: 7 days    Subjective: CC: mild abdominal pain  Patient states he is feeling well. Patient states pain is very minimal and he is not using his PCA much. He is belching a lot. No nausea. He was unaware that he had stool in his colostomy bag. No family at bedside. No new complaints.  Objective: Vital signs in last 24 hours: Temp:  [97.7 F (36.5 C)-98.7 F (37.1 C)] 97.7 F (36.5 C) (04/26 0515) Pulse Rate:  [96-113] 96 (04/26 0515) Resp:  [13-18] 16 (04/26 0515) BP: (102-123)/(66-82) 122/78 (04/26 0515) SpO2:  [92 %-98 %] 98 % (04/26 0515) Last BM Date: 08/29/17  Intake/Output from previous day: 04/25 0701 - 04/26 0700 In: 2250 [I.V.:2250] Out: 2980 [Urine:1930; Emesis/NG output:900; Blood:150] Intake/Output this shift: No intake/output data recorded.  PE: Gen:  Alert, NAD, pleasant, cooperative HEENT: NGT in place with brown liquid in canister  Pulm:  Rate and effort normal Abd: Soft, mild distention, +BS,  honeycomb dressing over the midline incision, brown liquid stool in colostomy bag, ostomy pink, generalized appropriate TTP with mild guarding Skin: no rashes noted, warm and dry   Anti-infectives: Anti-infectives (From admission, onward)   Start     Dose/Rate Route Frequency Ordered Stop   09/20/17 1702  cefoTEtan (CEFOTAN) 2 g in sodium chloride 0.9 % 100 mL IVPB  Status:  Discontinued     2 g 200 mL/hr over 30 Minutes Intravenous 30 min pre-op 09/20/17 1702 09/20/17 1704   09/20/17 0730  cefoTEtan (CEFOTAN) 2 g in sodium chloride 0.9 % 100 mL IVPB     2 g 200 mL/hr over 30 Minutes Intravenous 30 min pre-op 09/19/17 1156 09/20/17 0936   09/19/17 1145  cefoTEtan (CEFOTAN) 2 g in sodium chloride 0.9 % 100 mL IVPB  Status:  Discontinued     2 g 200 mL/hr over 30 Minutes Intravenous On call to O.R. 09/19/17 1144 09/19/17 1156   09/17/17 1245  linezolid (ZYVOX) IVPB 600 mg     600 mg 300 mL/hr over 60 Minutes Intravenous Every 12 hours 09/17/17 1243 09/19/17 0014   09/14/17 1000  linezolid (ZYVOX) tablet 600 mg  Status:  Discontinued     600 mg Oral 2 times daily 09/14/17 0401 09/17/17 1243      Lab Results:  Recent Labs    09/20/17 0647 09/21/17 0459  WBC 10.3 8.9  HGB 11.1* 10.8*  HCT 34.9* 33.8*  PLT 242 289   BMET Recent Labs    09/20/17 1442 09/21/17 0459  NA 131* 133*  K 5.5* 5.8*  CL 93* 93*  CO2 21* 25  GLUCOSE 105* 101*  BUN 13 16  CREATININE 0.92 0.88  CALCIUM 8.5* 8.4*   PT/INR No results for input(s): LABPROT, INR in the last 72 hours. CMP     Component Value Date/Time   NA 133 (L) 09/21/2017 0459   K 5.8 (H) 09/21/2017 0459   CL 93 (L) 09/21/2017 0459   CO2 25 09/21/2017 0459   GLUCOSE 101 (H) 09/21/2017 0459   BUN 16 09/21/2017 0459   CREATININE 0.88 09/21/2017 0459   CALCIUM 8.4 (L) 09/21/2017 0459   PROT 5.6 (L) 09/21/2017 0459   ALBUMIN 3.0 (L) 09/21/2017 0459   AST 64 (H) 09/21/2017 0459   ALT 93 (H) 09/21/2017 0459   ALKPHOS 52  09/21/2017 0459   BILITOT 1.5 (H) 09/21/2017 0459   GFRNONAA >60 09/21/2017 0459   GFRAA >60 09/21/2017 0459   Lipase     Component Value Date/Time   LIPASE 37 09/13/2017 2127    Studies/Results: No results found.    Kalman Drape , [facility] 09/21/2017, 8:57 AM  Pager: [phone] Mon-Wed, Friday 7:00am-4:30pm Thurs 7am-11:30am  Consults: [phone]

## 2017-09-21 NOTE — Progress Notes (Signed)
PROGRESS NOTE    Alex [name]  QIH:[id number] DOB: 1951/04/30 DOA: 09/13/2017 PCP: Antonietta Jewel, MD   Brief Narrative:  67 year old Caucasian male with past medical history significant for hypertension, hyperlipidemia, stroke, carotid artery stenosis, who was recently in the hospital for pneumonia and empyema and underwent surgical management. He presented with nausea, vomiting, abdominal distention as well as hypokalemia.Abdominal imaging is indicated possible ileus, although with this worsening abdominal distention, vomiting and lack of bowel movement/flatus, there is concern that he is progressing to Merikay Lesniewski bowel obstruction. NG tube placed. General surgery consulted.  Assessment & Plan:   Principal Problem:   Nausea & vomiting Active Problems:   History of stroke   Empyema (HCC)   Benign essential HTN   Abdominal distension   Dehydration   Hypokalemia   HLD (hyperlipidemia)   Nausea and vomiting   Malignant tumor of sigmoid colon (HCC)   Colon obstruction (HCC)   Nausea and vomiting secondary to large bowel obstruction  -Patient presents with nausea and vomiting, had some relief after NGT insertion CT abdomen pelvis on admission showing normal stomach and small bowels, and colon filled with liquid/air at the level of the sigmoid with mild sigmoid wall thickening, rectal contrast 4/22 showing high grade stenosis/obstruction of the distal sigmoid, with no contrast passing proximally. 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   - s/p ex lap, partial colectomy with end colostomy on 4/25 with gen surgery [ ]  follow up surgical pathology - Post op care per gen surg, NG clamped, PCA d/c'd, clears - Will need full colonoscopy as an outpatient to remove rectosigmoid and rectal polyps and screen unexamined colon (planning for  colonoscopy through colostomy and via rectum)  Bright Red Blood Per Rectum:  Pt hemodynamically stable.  Sounds like it resolved.  Likely related to biopsy of lesion from during sigmoidoscopy.  Will continue to monitor for now.  Lovenox restarted.  H/H stable.  Was unable to discuss with GI yesterday, but suspect due to bx and now pt is post op.  Ctm.   Recent hx ofEmpyema [facility]):  - s/p decortication. No chest pain, cough, shortness rest, fever. Patient was started on linezolid during his last admission and antibiotics were continued until 4/23.   History of stroke:  -Resume aspirin and Zocor when able to take oral   HLD:  - Zocor when able to take oral  Hypokalemia  Hyperkalemia: Pt now hyperkalemia.  D/c K in IVF.   Follow up repeat K.  No EKG changes.  HTN: - blood pressure stable. hold HCTZ . IV hydralazine prn  Tachycardia: pt mildly tachy postop.  Will continue IVF.  Check EKG.   Elevated LFTs: continue to follow.  Hepatitis panel. CT abdomen with unremarkable liver previously in hospitalization  DVT prophylaxis: lovenox Code Status: full  Family Communication: none at bedside Disposition Plan: pending surgery and further improvement   Consultants:   GI  Surgery  Procedures:  4/23 Flex Sig - Likely malignant completely obstructing tumor in the distal sigmoid colon. Biopsied. Tattooed. - Two 5 to 12 mm polyps in the rectum and at the recto-sigmoid colon. Resection not Attempted. - Internal hemorrhoids. - Return patient to hospital ward for ongoing care. - Await pathology results. - NPO given colonic obstruction. - Surgical management of obstructing sigmoid cancer. - Full colonoscopy is recommended as an outpatient after recovery  from colon resection to remove recto-sigmoid and rectal polyps and screening the unexamined Colon.  4/25 ex lap and partial colectomy with end colostomy with surgery  Antimicrobials:  Anti-infectives (From admission, onward)     Start     Dose/Rate Route Frequency Ordered Stop   09/20/17 1702  cefoTEtan (CEFOTAN) 2 g in sodium chloride 0.9 % 100 mL IVPB  Status:  Discontinued     2 g 200 mL/hr over 30 Minutes Intravenous 30 min pre-op 09/20/17 1702 09/20/17 1704   09/20/17 0730  cefoTEtan (CEFOTAN) 2 g in sodium chloride 0.9 % 100 mL IVPB     2 g 200 mL/hr over 30 Minutes Intravenous 30 min pre-op 09/19/17 1156 09/20/17 0936   09/19/17 1145  cefoTEtan (CEFOTAN) 2 g in sodium chloride 0.9 % 100 mL IVPB  Status:  Discontinued     2 g 200 mL/hr over 30 Minutes Intravenous On call to O.R. 09/19/17 1144 09/19/17 1156   09/17/17 1245  linezolid (ZYVOX) IVPB 600 mg     600 mg 300 mL/hr over 60 Minutes Intravenous Every 12 hours 09/17/17 1243 09/19/17 0014   09/14/17 1000  linezolid (ZYVOX) tablet 600 mg  Status:  Discontinued     600 mg Oral 2 times daily 09/14/17 0401 09/17/17 1243     Subjective: Doing ok.  No compliants.   Objective: Vitals:   09/21/17 0333 09/21/17 0515 09/21/17 0839 09/21/17 1427  BP:  122/78  117/83  Pulse:  96  92  Resp: 18 16 16    Temp:  97.7 F (36.5 C)  98.4 F (36.9 C)  TempSrc:  Oral  Oral  SpO2: 96% 98% 98% 97%  Weight:      Height:        Intake/Output Summary (Last 24 hours) at 09/21/2017 1839 Last data filed at 09/21/2017 1700 Gross per 24 hour  Intake 2610 ml  Output 3300 ml  Net -690 ml   Filed Weights   09/14/17 0400 09/14/17 1504  Weight: 78.5 kg (173 lb 1 oz) 75.9 kg (167 lb 5.3 oz)    Examination:  General: No acute distress. Cardiovascular: Heart sounds show Kaycee Haycraft regular rate, and rhythm. No gallops or rubs. No murmurs. No JVD. Lungs: Clear to auscultation bilaterally with good air movement. No rales, rhonchi or wheezes. Abdomen: Soft, appropriately tender, nondistended with normal active bowel sounds. No masses. No hepatosplenomegaly.  Colostomy on L.  Midline incision with dressing.  NG.  Neurological: Alert and oriented 3. Moves all extremities 4 with  equal strength. Cranial nerves II through XII grossly intact. Skin: Warm and dry. No rashes or lesions. Extremities: No clubbing or cyanosis. No edema.  Psychiatric: Mood and affect are normal. Insight and judgment are appropriate.    Data Reviewed: I have personally reviewed following labs and imaging studies  CBC: Recent Labs  Lab 09/15/17 0627 09/19/17 0501 09/19/17 1801 09/20/17 0647 09/21/17 0459  WBC 7.4 9.5  --  10.3 8.9  NEUTROABS 5.2  --   --   --   --   HGB 10.4* 11.0* 10.8* 11.1* 10.8*  HCT 33.2* 34.7* 34.2* 34.9* 33.8*  MCV 82.8 81.5  --  81.9 82.2  PLT 224 239  --  242 299   Basic Metabolic Panel: Recent Labs  Lab 09/15/17 0627 09/16/17 0922 09/17/17 0610 09/19/17 0501 09/20/17 0647 09/20/17 1442 09/21/17 0459  NA 136 137  136 133* 134* 131* 131* 133*  K 3.1* 3.8  3.7 4.1 4.9 5.6* 5.5* 5.8*  CL 102 105  103 102 95* 95* 93* 93*  CO2 25 25  25 22 25 24  21* 25  GLUCOSE 107* 106*  104* 78 72 73 105* 101*  BUN 14 9  9 8 11 12 13 16   CREATININE 0.70 0.69  0.71 0.63 0.83 0.79 0.92 0.88  CALCIUM 8.3* 8.7*  8.7* 8.8* 9.2 9.1 8.5* 8.4*  MG 1.8  --   --   --   --   --  1.9  PHOS 2.2* 2.5 2.9  --   --   --   --    GFR: Estimated Creatinine Clearance: 74.5 mL/min (by C-G formula based on SCr of 0.88 mg/dL). Liver Function Tests: Recent Labs  Lab 09/15/17 0627 09/16/17 0922 09/17/17 0610 09/20/17 0647 09/21/17 0459  AST  --   --   --  73* 64*  ALT  --   --   --  96* 93*  ALKPHOS  --   --   --  62 52  BILITOT  --   --   --  1.3* 1.5*  PROT  --   --   --  6.1* 5.6*  ALBUMIN 3.1* 3.2* 3.3* 3.5 3.0*   No results for input(s): LIPASE, AMYLASE in the last 168 hours. No results for input(s): AMMONIA in the last 168 hours. Coagulation Profile: No results for input(s): INR, PROTIME in the last 168 hours. Cardiac Enzymes: No results for input(s): CKTOTAL, CKMB, CKMBINDEX, TROPONINI in the last 168 hours. BNP (last 3 results) No results for input(s):  PROBNP in the last 8760 hours. HbA1C: No results for input(s): HGBA1C in the last 72 hours. CBG: No results for input(s): GLUCAP in the last 168 hours. Lipid Profile: No results for input(s): CHOL, HDL, LDLCALC, TRIG, CHOLHDL, LDLDIRECT in the last 72 hours. Thyroid Function Tests: No results for input(s): TSH, T4TOTAL, FREET4, T3FREE, THYROIDAB in the last 72 hours. Anemia Panel: No results for input(s): VITAMINB12, FOLATE, FERRITIN, TIBC, IRON, RETICCTPCT in the last 72 hours. Sepsis Labs: No results for input(s): PROCALCITON, LATICACIDVEN in the last 168 hours.  Recent Results (from the past 240 hour(s))  Surgical pcr screen     Status: Abnormal   Collection Time: 09/19/17  9:07 PM  Result Value Ref Range Status   MRSA, PCR POSITIVE (Deberah Adolf) NEGATIVE Final    Comment: RESULT CALLED TO, READ BACK BY AND VERIFIED WITH: Isidoro Donning RN 09/19/17 [id number] JDW    Staphylococcus aureus POSITIVE (Carling Liberman) NEGATIVE Final    Comment: (NOTE) The Xpert SA Assay (FDA approved for NASAL specimens in patients 35 years of age and older), is one component of Chibueze Beasley comprehensive surveillance program. It is not intended to diagnose infection nor to guide or monitor treatment.          Radiology Studies: No results found.      Scheduled Meds: . Chlorhexidine Gluconate Cloth  6 each Topical Q0600  . enoxaparin (LOVENOX) injection  40 mg Subcutaneous Q24H  . mouth rinse  15 mL Mouth Rinse BID  . mupirocin ointment  1 application Nasal BID   Continuous Infusions: . sodium chloride 1 mL (09/21/17 1605)  . famotidine (PEPCID) IV Stopped (09/21/17 1141)  . methocarbamol (ROBAXIN)  IV       LOS: 7 days    Time spent: over 30 min    Fayrene Helper, MD [facility] Pager [phone]  If 7PM-7AM, please contact night-coverage [email or web address] Password [facility] 09/21/2017, 6:39 PM

## 2017-09-22 LAB — COMPREHENSIVE METABOLIC PANEL
ALT: 56 U/L (ref 17–63)
ANION GAP: 9 (ref 5–15)
AST: 31 U/L (ref 15–41)
Albumin: 2.4 g/dL — ABNORMAL LOW (ref 3.5–5.0)
Alkaline Phosphatase: 41 U/L (ref 38–126)
BUN: 10 mg/dL (ref 6–20)
CALCIUM: 7.9 mg/dL — AB (ref 8.9–10.3)
CHLORIDE: 93 mmol/L — AB (ref 101–111)
CO2: 30 mmol/L (ref 22–32)
Creatinine, Ser: 0.62 mg/dL (ref 0.61–1.24)
GFR calc non Af Amer: >60 mL/min (ref >60)
Glucose, Bld: 89 mg/dL (ref 65–99)
Potassium: 3.9 mmol/L (ref 3.5–5.1)
SODIUM: 132 mmol/L — AB (ref 135–145)
Total Bilirubin: 0.7 mg/dL (ref 0.3–1.2)
Total Protein: 4.8 g/dL — ABNORMAL LOW (ref 6.5–8.1)

## 2017-09-22 LAB — CBC
HCT: 22.8 % — ABNORMAL LOW (ref 39.0–52.0)
Hemoglobin: 7.2 g/dL — ABNORMAL LOW (ref 13.0–17.0)
MCH: 26.1 pg (ref 26.0–34.0)
MCHC: 31.6 g/dL (ref 30.0–36.0)
MCV: 82.6 fL (ref 78.0–100.0)
PLATELETS: 203 10*3/uL (ref 150–400)
RBC: 2.76 MIL/uL — ABNORMAL LOW (ref 4.22–5.81)
RDW: 16.4 % — ABNORMAL HIGH (ref 11.5–15.5)
WBC: 4.7 10*3/uL (ref 4.0–10.5)

## 2017-09-22 LAB — MAGNESIUM: MAGNESIUM: 2.1 mg/dL (ref 1.7–2.4)

## 2017-09-22 LAB — HEMOGLOBIN AND HEMATOCRIT, BLOOD
HEMATOCRIT: 24.7 % — AB (ref 39.0–52.0)
HEMOGLOBIN: 7.7 g/dL — AB (ref 13.0–17.0)

## 2017-09-22 NOTE — Plan of Care (Signed)
  Problem: Health Behavior/Discharge Planning: Goal: Ability to manage health-related needs will improve Outcome: Progressing   Problem: Clinical Measurements: Goal: Ability to maintain clinical measurements within normal limits will improve Outcome: Progressing Goal: Will remain free from infection Outcome: Progressing   

## 2017-09-22 NOTE — Evaluation (Signed)
Physical Therapy Evaluation Patient Details Name: Alex [name] MRN: [id number] DOB: [birth year] Today's Date: 09/22/2017   History of Present Illness  66 yo admitted with N/V and abdominal distension found to have large bowel obstruction due to CA s/p exp lap with partial colectomy, colostomy 4/25. PMHx: HTN, HLD, CVA, who was recently in the hospital for pneumonia and empyema s/p VATS 4/4  Clinical Impression  Pt supine on arrival and states he has been moving in room without difficulty. Pt able to perform all basic transfers without cues or assist and able to walk entire unit with mild initial balance deficits. Pt educated to be OOB in chair throughout the day and continue to walk with nursing supervision. Pt with education for mild deficits initially but able to correct without assist and assume will correct completely with increased mobility. No further therapy needs at this time with pt aware and agreeable.      Follow Up Recommendations No PT follow up    Equipment Recommendations  None recommended by PT    Recommendations for Other Services       Precautions / Restrictions Precautions Precaution Comments: ostomy Restrictions Weight Bearing Restrictions: No      Mobility  Bed Mobility Overal bed mobility: Modified Independent             General bed mobility comments: rolling and side to sit with rail, bed flat  Transfers Overall transfer level: Modified independent                  Ambulation/Gait Ambulation/Gait assistance: Modified independent (Device/Increase time) Ambulation Distance (Feet): 800 Feet Assistive device: None Gait Pattern/deviations: WFL(Within Functional Limits)   Gait velocity interpretation: >4.37 ft/sec, indicative of normal walking speed General Gait Details: Pt with good stability with gait. Initial gait pt mildly unsteady with head turns but after additional 300' pt able to complete head turns in all directions without LOB.  Pt able to complete change of direction and speed without difficulty  Stairs            Wheelchair Mobility    Modified Rankin (Stroke Patients Only)       Balance Overall balance assessment: Mild deficits observed, not formally tested                                           Pertinent Vitals/Pain Pain Assessment: 0-10 Pain Score: 2  Pain Location: abdomen Pain Descriptors / Indicators: Sore Pain Intervention(s): Limited activity within patient's tolerance    Home Living Family/patient expects to be discharged to:: Private residence Living Arrangements: Children Available Help at Discharge: Family;Available 24 hours/day Type of Home: House Home Access: Stairs to enter   [employer] of Steps: 2 Home Layout: One level Home Equipment: None      Prior Function Level of Independence: Independent               Hand Dominance        Extremity/Trunk Assessment   Upper Extremity Assessment Upper Extremity Assessment: Overall WFL for tasks assessed    Lower Extremity Assessment Lower Extremity Assessment: Overall WFL for tasks assessed    Cervical / Trunk Assessment Cervical / Trunk Assessment: Normal  Communication   Communication: No difficulties  Cognition Arousal/Alertness: Awake/alert Behavior During Therapy: WFL for tasks assessed/performed Overall Cognitive Status: Within Functional Limits for tasks assessed  General Comments      Exercises     Assessment/Plan    PT Assessment Patent does not need any further PT services  PT Problem List         PT Treatment Interventions      PT Goals (Current goals can be found in the Care Plan section)  Acute Rehab PT Goals PT Goal Formulation: All assessment and education complete, DC therapy    Frequency     Barriers to discharge        Co-evaluation               AM-PAC PT "6 Clicks" Daily  Activity  Outcome Measure Difficulty turning over in bed (including adjusting bedclothes, sheets and blankets)?: A Little Difficulty moving from lying on back to sitting on the side of the bed? : A Little Difficulty sitting down on and standing up from a chair with arms (e.g., wheelchair, bedside commode, etc,.)?: None Help needed moving to and from a bed to chair (including a wheelchair)?: None Help needed walking in hospital room?: None Help needed climbing 3-5 steps with a railing? : None 6 Click Score: 22    End of Session Equipment Utilized During Treatment: Gait belt Activity Tolerance: Patient tolerated treatment well Patient left: in chair;with call bell/phone within [name] Nurse Communication: Mobility status PT Visit Diagnosis: Other abnormalities of gait and mobility (R26.89)    Time: 9826-4158 PT Time Calculation (min) (ACUTE ONLY): 17 min   Charges:   PT Evaluation $PT Eval Low Complexity: 1 Low     PT G Codes:        Alex [name], PT [phone]   Alex [name] Alex [name] 09/22/2017, 11:38 AM

## 2017-09-22 NOTE — Progress Notes (Signed)
2 Days Post-Op  Subjective: Tolerating clear liquids and happy to have NG tube out. Passing stool and flatus per ostomy Vital signs stable.  Good urine output.  Ambulating in room.  Seems quite comfortable. Hemoglobin went from 10.8 yesterday to 7.2 today.  WBC 4700.  Objective: Vital signs in last 24 hours: Temp:  [98.2 F (36.8 C)-98.4 F (36.9 C)] 98.2 F (36.8 C) (04/27 0644) Pulse Rate:  [84-92] 84 (04/27 0644) Resp:  [18] 18 (04/27 0644) BP: (114-121)/(70-83) 114/70 (04/27 0644) SpO2:  [96 %-97 %] 97 % (04/27 0644) Last BM Date: 09/22/17  Intake/Output from previous day: 04/26 0701 - 04/27 0700 In: 2780 [P.O.:600; I.V.:1630; NG/GT:400; IV Piggyback:150] Out: 1350 [Urine:1050; Emesis/NG output:300] Intake/Output this shift: Total I/O In: 360 [P.O.:360] Out: -   PE: Gen: Alert, NAD, pleasant, cooperative ambulating independently in room. Pulm:Rate andeffort normal Abd: Soft,mild distention,+BS, honeycomb dressing over the midline incision, brown liquid stool in colostomy bag, ostomy pink, generalized appropriate TTP with mild guarding Skin: no rashes noted, warm and dry      Lab Results:  Results for orders placed or performed during the hospital encounter of 09/13/17 (from the past 24 hour(s))  Potassium     Status: None   Collection Time: 09/21/17  6:45 PM  Result Value Ref Range   Potassium 4.5 3.5 - 5.1 mmol/L  CBC     Status: Abnormal   Collection Time: 09/22/17  4:48 AM  Result Value Ref Range   WBC 4.7 4.0 - 10.5 K/uL   RBC 2.76 (L) 4.22 - 5.81 MIL/uL   Hemoglobin 7.2 (L) 13.0 - 17.0 g/dL   HCT 22.8 (L) 39.0 - 52.0 %   MCV 82.6 78.0 - 100.0 fL   MCH 26.1 26.0 - 34.0 pg   MCHC 31.6 30.0 - 36.0 g/dL   RDW 16.4 (H) 11.5 - 15.5 %   Platelets 203 150 - 400 K/uL  Comprehensive metabolic panel     Status: Abnormal   Collection Time: 09/22/17  4:48 AM  Result Value Ref Range   Sodium 132 (L) 135 - 145 mmol/L   Potassium 3.9 3.5 - 5.1 mmol/L    Chloride 93 (L) 101 - 111 mmol/L   CO2 30 22 - 32 mmol/L   Glucose, Bld 89 65 - 99 mg/dL   BUN 10 6 - 20 mg/dL   Creatinine, Ser 0.62 0.61 - 1.24 mg/dL   Calcium 7.9 (L) 8.9 - 10.3 mg/dL   Total Protein 4.8 (L) 6.5 - 8.1 g/dL   Albumin 2.4 (L) 3.5 - 5.0 g/dL   AST 31 15 - 41 U/L   ALT 56 17 - 63 U/L   Alkaline Phosphatase 41 38 - 126 U/L   Total Bilirubin 0.7 0.3 - 1.2 mg/dL   GFR calc non Af Amer >60 >60 mL/min   GFR calc Af Amer >60 >60 mL/min   Anion gap 9 5 - 15  Magnesium     Status: None   Collection Time: 09/22/17  4:48 AM  Result Value Ref Range   Magnesium 2.1 1.7 - 2.4 mg/dL     Studies/Results: No results found.  . Chlorhexidine Gluconate Cloth  6 each Topical Q0600  . mouth rinse  15 mL Mouth Rinse BID  . mupirocin ointment  1 application Nasal BID     Assessment/Plan: s/p Procedure(s): OPEN PARTIAL COLECTOMY WITH COLOSTOMY  Large bowel obstruction - s/p VATS/decortication 08/30/17 - CT scan 4/19 Colon is filled with liquid/air to the level  of the sigmoid. Mild sigmoid wall thickening. - rectal contrast study 4/22 showed high grade stenosis/obstruction of distal sigmoid. - S/P flex sig, Dr. 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 Hilarie Fredrickson expected to be malignant - S/P exploratory laparotomy, and partial colectomy (rectosigmoid) with end colostomy, Dr. Kae Heller, 04/25   Acute blood loss anemia.  Significant variance without alteration in vital signs.  No external signs of bleeding. -Discontinue Lovenox -Check CBC tomorrow   FEN: IVF, tolerating clear liquids.  Advance to full liquids VTE: SCD's  (HOLD LOVENOX) ID: Linezolid per CT surgery, Cefotetan Pre-op Foley: none Follow up: TBD  DISPO:stool output in bag  Full Liqs.. DC PCA. CBC in am.    @PROBHOSP @  LOS: 8 days    Alex [name] 09/22/2017  . .prob

## 2017-09-22 NOTE — Progress Notes (Addendum)
PROGRESS NOTE    Alex [name]  TOI:[id number] DOB: May 29, 1951 DOA: 09/13/2017 PCP: Antonietta Jewel, MD   Brief Narrative:  67 year old Caucasian male with past medical history significant for hypertension, hyperlipidemia, stroke, carotid artery stenosis, who was recently in the hospital for pneumonia and empyema and underwent surgical management. He presented with nausea, vomiting, abdominal distention as well as hypokalemia.Abdominal imaging is indicated possible ileus, although with this worsening abdominal distention, vomiting and lack of bowel movement/flatus, there is concern that he is progressing to Keyairra Kolinski bowel obstruction. NG tube placed. General surgery consulted.  Assessment & Plan:   Principal Problem:   Nausea & vomiting Active Problems:   History of stroke   Empyema (HCC)   Benign essential HTN   Abdominal distension   Dehydration   Hypokalemia   HLD (hyperlipidemia)   Nausea and vomiting   Malignant tumor of sigmoid colon (HCC)   Colon obstruction (HCC)   Nausea and vomiting secondary to large bowel obstruction  -Patient presents with nausea and vomiting, had some relief after NGT insertion CT abdomen pelvis on admission showing normal stomach and small bowels, and colon filled with liquid/air at the level of the sigmoid with mild sigmoid wall thickening, rectal contrast 4/22 showing high grade stenosis/obstruction of the distal sigmoid, with no contrast passing proximally. 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   - s/p ex lap, partial colectomy with end colostomy on 4/25 with gen surgery [ ]  follow up surgical pathology - Post op care per gen surg, NG clamped, PCA d/c'd, clears (advance to full liquids) - Will need full colonoscopy as an outpatient to remove rectosigmoid and rectal polyps and screen  unexamined colon (planning for colonoscopy through colostomy and via rectum)  Acute Blood Loss Anemia: holding pharm dvt ppx for now.  No active bleeding.  Brown stool in ostomy.  Repeat H/H stable.  Anemia panel in AM.   Bright Red Blood Per Rectum:  Occurred prior to surgery and after sigmoidoscopy.  Pt hemodynamically stable.  Now resolved.  Likely related to biopsy of lesion from during sigmoidoscopy.  H/H downtrending postop now, but no reported bleeding.  suspect due to bx and now pt is post op.  Ctm.   Recent hx ofEmpyema [facility]):  - s/p decortication. No chest pain, cough, shortness rest, fever. Patient was started on linezolid during his last admission and antibiotics were continued until 4/23.   History of stroke:  -Resume aspirin and Zocor when able to take oral   HLD:  - Zocor when able to take oral  Hypokalemia  Hyperkalemia: Pt now hyperkalemia.  D/c K in IVF.   Follow up repeat K.  No EKG changes. Improved  HTN: - blood pressure stable. hold HCTZ . IV hydralazine prn.  BP stable.  Tachycardia: improved. EKG NSR.  Elevated LFTs: continue to follow.  Hepatitis panel pending. CT abdomen with unremarkable liver previously in hospitalization  DVT prophylaxis: lovenox Code Status: full  Family Communication: none at bedside Disposition Plan: pending surgery and further improvement   Consultants:   GI  Surgery  Procedures:  4/23 Flex Sig - Likely malignant completely obstructing tumor in the distal sigmoid colon. Biopsied. Tattooed. - Two 5 to 12 mm polyps in the rectum and at the recto-sigmoid colon. Resection not Attempted. - Internal hemorrhoids. - Return patient to hospital ward for ongoing care. - Await  pathology results. - NPO given colonic obstruction. - Surgical management of obstructing sigmoid cancer. - Full colonoscopy is recommended as an outpatient after recovery from colon resection to remove recto-sigmoid and rectal polyps and screening  the unexamined Colon.  4/25 ex lap and partial colectomy with end colostomy with surgery  Antimicrobials:  Anti-infectives (From admission, onward)   Start     Dose/Rate Route Frequency Ordered Stop   09/20/17 1702  cefoTEtan (CEFOTAN) 2 g in sodium chloride 0.9 % 100 mL IVPB  Status:  Discontinued     2 g 200 mL/hr over 30 Minutes Intravenous 30 min pre-op 09/20/17 1702 09/20/17 1704   09/20/17 0730  cefoTEtan (CEFOTAN) 2 g in sodium chloride 0.9 % 100 mL IVPB     2 g 200 mL/hr over 30 Minutes Intravenous 30 min pre-op 09/19/17 1156 09/20/17 0936   09/19/17 1145  cefoTEtan (CEFOTAN) 2 g in sodium chloride 0.9 % 100 mL IVPB  Status:  Discontinued     2 g 200 mL/hr over 30 Minutes Intravenous On call to O.R. 09/19/17 1144 09/19/17 1156   09/17/17 1245  linezolid (ZYVOX) IVPB 600 mg     600 mg 300 mL/hr over 60 Minutes Intravenous Every 12 hours 09/17/17 1243 09/19/17 0014   09/14/17 1000  linezolid (ZYVOX) tablet 600 mg  Status:  Discontinued     600 mg Oral 2 times daily 09/14/17 0401 09/17/17 1243     Subjective: Doing ok.  No compliants.   Objective: Vitals:   09/21/17 1427 09/21/17 2238 09/22/17 0644 09/22/17 1522  BP: 117/83 121/71 114/70 109/68  Pulse: 92 90 84 86  Resp:  18 18 16   Temp: 98.4 F (36.9 C) 98.4 F (36.9 C) 98.2 F (36.8 C) 98.4 F (36.9 C)  TempSrc: Oral Oral Oral Oral  SpO2: 97% 96% 97% 98%  Weight:      Height:        Intake/Output Summary (Last 24 hours) at 09/22/2017 1642 Last data filed at 09/22/2017 1522 Gross per 24 hour  Intake 2156.25 ml  Output 650 ml  Net 1506.25 ml   Filed Weights   09/14/17 0400 09/14/17 1504  Weight: 78.5 kg (173 lb 1 oz) 75.9 kg (167 lb 5.3 oz)    Examination:  General: No acute distress. Cardiovascular: Heart sounds show Madilynn Montante regular rate, and rhythm. No gallops or rubs. No murmurs. No JVD. Lungs: Clear to auscultation bilaterally with good air movement. No rales, rhonchi or wheezes. Abdomen: Soft,  appropriately tender, nondistended with normal active bowel sounds. No masses. No hepatosplenomegaly.  Colostomy on L.  Midline incision with dressing.  NG.  Neurological: Alert and oriented 3. Moves all extremities 4 with equal strength. Cranial nerves II through XII grossly intact. Skin: Warm and dry. No rashes or lesions. Extremities: No clubbing or cyanosis. No edema.  Psychiatric: Mood and affect are normal. Insight and judgment are appropriate.    Data Reviewed: I have personally reviewed following labs and imaging studies  CBC: Recent Labs  Lab 09/19/17 0501 09/19/17 1801 09/20/17 0647 09/21/17 0459 09/22/17 0448 09/22/17 1348  WBC 9.5  --  10.3 8.9 4.7  --   HGB 11.0* 10.8* 11.1* 10.8* 7.2* 7.7*  HCT 34.7* 34.2* 34.9* 33.8* 22.8* 24.7*  MCV 81.5  --  81.9 82.2 82.6  --   PLT 239  --  242 289 203  --    Basic Metabolic Panel: Recent Labs  Lab 09/16/17 0922 09/17/17 0610 09/19/17 0501 09/20/17 0647 09/20/17  1442 09/21/17 0459 09/21/17 1845 09/22/17 0448  NA 137  136 133* 134* 131* 131* 133*  --  132*  K 3.8  3.7 4.1 4.9 5.6* 5.5* 5.8* 4.5 3.9  CL 105  103 102 95* 95* 93* 93*  --  93*  CO2 25  25 22 25 24  21* 25  --  30  GLUCOSE 106*  104* 78 72 73 105* 101*  --  89  BUN 9  9 8 11 12 13 16   --  10  CREATININE 0.69  0.71 0.63 0.83 0.79 0.92 0.88  --  0.62  CALCIUM 8.7*  8.7* 8.8* 9.2 9.1 8.5* 8.4*  --  7.9*  MG  --   --   --   --   --  1.9  --  2.1  PHOS 2.5 2.9  --   --   --   --   --   --    GFR: Estimated Creatinine Clearance: 82 mL/min (by C-G formula based on SCr of 0.62 mg/dL). Liver Function Tests: Recent Labs  Lab 09/16/17 0922 09/17/17 0610 09/20/17 0647 09/21/17 0459 09/22/17 0448  AST  --   --  73* 64* 31  ALT  --   --  96* 93* 56  ALKPHOS  --   --  62 52 41  BILITOT  --   --  1.3* 1.5* 0.7  PROT  --   --  6.1* 5.6* 4.8*  ALBUMIN 3.2* 3.3* 3.5 3.0* 2.4*   No results for input(s): LIPASE, AMYLASE in the last 168 hours. No  results for input(s): AMMONIA in the last 168 hours. Coagulation Profile: No results for input(s): INR, PROTIME in the last 168 hours. Cardiac Enzymes: No results for input(s): CKTOTAL, CKMB, CKMBINDEX, TROPONINI in the last 168 hours. BNP (last 3 results) No results for input(s): PROBNP in the last 8760 hours. HbA1C: No results for input(s): HGBA1C in the last 72 hours. CBG: No results for input(s): GLUCAP in the last 168 hours. Lipid Profile: No results for input(s): CHOL, HDL, LDLCALC, TRIG, CHOLHDL, LDLDIRECT in the last 72 hours. Thyroid Function Tests: No results for input(s): TSH, T4TOTAL, FREET4, T3FREE, THYROIDAB in the last 72 hours. Anemia Panel: No results for input(s): VITAMINB12, FOLATE, FERRITIN, TIBC, IRON, RETICCTPCT in the last 72 hours. Sepsis Labs: No results for input(s): PROCALCITON, LATICACIDVEN in the last 168 hours.  Recent Results (from the past 240 hour(s))  Surgical pcr screen     Status: Abnormal   Collection Time: 09/19/17  9:07 PM  Result Value Ref Range Status   MRSA, PCR POSITIVE (Shanasia Ibrahim) NEGATIVE Final    Comment: RESULT CALLED TO, READ BACK BY AND VERIFIED WITH: Isidoro Donning RN 09/19/17 [id number] JDW    Staphylococcus aureus POSITIVE (Conor Filsaime) NEGATIVE Final    Comment: (NOTE) The Xpert SA Assay (FDA approved for NASAL specimens in patients 66 years of age and older), is one component of Madden Garron comprehensive surveillance program. It is not intended to diagnose infection nor to guide or monitor treatment.          Radiology Studies: No results found.      Scheduled Meds: . Chlorhexidine Gluconate Cloth  6 each Topical Q0600  . mouth rinse  15 mL Mouth Rinse BID  . mupirocin ointment  1 application Nasal BID   Continuous Infusions: . sodium chloride 75 mL/hr at 09/22/17 0552  . famotidine (PEPCID) IV Stopped (09/22/17 1038)  . methocarbamol (ROBAXIN)  IV  LOS: 8 days    Time spent: over 30 min    Fayrene Helper, MD [facility] Pager [phone]  If 7PM-7AM, please contact night-coverage [email or web address] Password [facility] 09/22/2017, 4:42 PM

## 2017-09-23 LAB — BASIC METABOLIC PANEL
ANION GAP: 8 (ref 5–15)
BUN: 6 mg/dL (ref 6–20)
CHLORIDE: 99 mmol/L — AB (ref 101–111)
CO2: 27 mmol/L (ref 22–32)
Calcium: 7.8 mg/dL — ABNORMAL LOW (ref 8.9–10.3)
Creatinine, Ser: 0.6 mg/dL — ABNORMAL LOW (ref 0.61–1.24)
GFR calc Af Amer: >60 mL/min (ref >60)
GLUCOSE: 91 mg/dL (ref 65–99)
POTASSIUM: 3.6 mmol/L (ref 3.5–5.1)
Sodium: 134 mmol/L — ABNORMAL LOW (ref 135–145)

## 2017-09-23 LAB — VITAMIN B12: Vitamin B-12: 184 pg/mL (ref 180–914)

## 2017-09-23 LAB — CBC
HEMATOCRIT: 21.7 % — AB (ref 39.0–52.0)
HEMOGLOBIN: 7 g/dL — AB (ref 13.0–17.0)
MCH: 27 pg (ref 26.0–34.0)
MCHC: 32.3 g/dL (ref 30.0–36.0)
MCV: 83.8 fL (ref 78.0–100.0)
PLATELETS: 235 10*3/uL (ref 150–400)
RBC: 2.59 MIL/uL — AB (ref 4.22–5.81)
RDW: 16.7 % — ABNORMAL HIGH (ref 11.5–15.5)
WBC: 5.3 10*3/uL (ref 4.0–10.5)

## 2017-09-23 LAB — IRON AND TIBC
IRON: 18 ug/dL — AB (ref 45–182)
Saturation Ratios: 9 % — ABNORMAL LOW (ref 17.9–39.5)
TIBC: 206 ug/dL — AB (ref 250–450)
UIBC: 188 ug/dL

## 2017-09-23 LAB — FERRITIN: FERRITIN: 186 ng/mL (ref 24–336)

## 2017-09-23 LAB — RETICULOCYTES
RBC.: 2.59 MIL/uL — ABNORMAL LOW (ref 4.22–5.81)
RETIC COUNT ABSOLUTE: 158 10*3/uL (ref 19.0–186.0)
Retic Ct Pct: 6.1 % — ABNORMAL HIGH (ref 0.4–3.1)

## 2017-09-23 LAB — MAGNESIUM: Magnesium: 2 mg/dL (ref 1.7–2.4)

## 2017-09-23 LAB — FOLATE: FOLATE: 5.7 ng/mL — AB (ref >5.9)

## 2017-09-23 MED ORDER — FOLIC ACID 1 MG PO TABS
1.0000 mg | ORAL_TABLET | Freq: Every day | ORAL | Status: DC
  Administered 2017-09-23 – 2017-09-25 (×3): 1 mg via ORAL
  Filled 2017-09-23 (×3): qty 1

## 2017-09-23 MED ORDER — FERROUS GLUCONATE 324 (38 FE) MG PO TABS
324.0000 mg | ORAL_TABLET | Freq: Two times a day (BID) | ORAL | Status: DC
  Administered 2017-09-23 – 2017-09-25 (×5): 324 mg via ORAL
  Filled 2017-09-23 (×6): qty 1

## 2017-09-23 NOTE — Progress Notes (Signed)
3 Days Post-Op  Subjective: Alert and says he feels good.  Tolerating full liquids.  Lots of stool and flatus per ostomy.  No bleeding. Now ambulating in halls without difficulty. Hemoglobin was 10.8 on 426.  Down to 7.2 yesterday.  7.0 this morning.  Objective: Vital signs in last 24 hours: Temp:  [98.2 F (36.8 C)-98.6 F (37 C)] 98.2 F (36.8 C) (04/28 0300) Pulse Rate:  [83-88] 83 (04/28 0300) Resp:  [16] 16 (04/27 1522) BP: (109-127)/(68-72) 119/70 (04/28 0300) SpO2:  [97 %-98 %] 98 % (04/28 0300) Last BM Date: 09/22/17  Intake/Output from previous day: 04/27 0701 - 04/28 0700 In: 2463.8 [P.O.:600; I.V.:1813.8; IV Piggyback:50] Out: 700 [Urine:200; Stool:500] Intake/Output this shift: No intake/output data recorded.  PE: Gen: Alert, NAD, pleasant, cooperative ambulating independently in hall Pulm:Rate andeffort normal Abd: Soft,nondistended.,+BS,honeycomb dressing over the midline incision, midline incision clean and dry without bleeding.  Brown liquid stool in colostomy bag, ostomy pink, generalized appropriate mild TTP with mild guarding Skin: no rashes noted, warm and dry    Lab Results:  Results for orders placed or performed during the hospital encounter of 09/13/17 (from the past 24 hour(s))  Hemoglobin and hematocrit, blood     Status: Abnormal   Collection Time: 09/22/17  1:48 PM  Result Value Ref Range   Hemoglobin 7.7 (L) 13.0 - 17.0 g/dL   HCT 24.7 (L) 39.0 - 52.0 %  CBC     Status: Abnormal   Collection Time: 09/23/17  7:06 AM  Result Value Ref Range   WBC 5.3 4.0 - 10.5 K/uL   RBC 2.59 (L) 4.22 - 5.81 MIL/uL   Hemoglobin 7.0 (L) 13.0 - 17.0 g/dL   HCT 21.7 (L) 39.0 - 52.0 %   MCV 83.8 78.0 - 100.0 fL   MCH 27.0 26.0 - 34.0 pg   MCHC 32.3 30.0 - 36.0 g/dL   RDW 16.7 (H) 11.5 - 15.5 %   Platelets 235 150 - 400 K/uL  Reticulocytes     Status: Abnormal   Collection Time: 09/23/17  7:06 AM  Result Value Ref Range   Retic Ct Pct 6.1 (H) 0.4 -  3.1 %   RBC. 2.59 (L) 4.22 - 5.81 MIL/uL   Retic Count, Absolute 158.0 19.0 - 186.0 K/uL     Studies/Results: No results found.  . Chlorhexidine Gluconate Cloth  6 each Topical Q0600  . ferrous gluconate  324 mg Oral BID WC  . mouth rinse  15 mL Mouth Rinse BID  . mupirocin ointment  1 application Nasal BID     Assessment/Plan: s/p Procedure(s): OPEN PARTIAL COLECTOMY WITH COLOSTOMY  Large bowel obstruction - s/p VATS/decortication 08/30/17 - CT scan 4/19 Colon is filled with liquid/air to the level of the sigmoid. Mild sigmoid wall thickening. - rectal contrast study 4/22 showed high grade stenosis/obstruction of distal sigmoid. - S/P flex sig, Dr. 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 Hilarie Fredrickson expected to be malignant - POD#3 - S/Pexploratory laparotomy, and partial colectomy (rectosigmoid) with end colostomy, Dr. Kae Heller, 04/25   Acute blood loss anemia.  Significant variance without alteration in vital signs.  No external signs of bleeding.  Hemoglobin 7.0 this morning.  Doubt ongoing blood loss.  Anemia is being tolerated well. - continue to hold Lovenox -Start ferrous gluconate -Check CBC tomorrow   FEN: IVF,  advance to soft diet VTE: SCD's  (HOLD  LOVENOX) ID: Linezolid per CT surgery, Cefotetan Pre-op Foley: none Follow up: TBD  DISPO:stool output in bag  Full Liqs.. DC PCA. CBC in am.     @PROBHOSP @  LOS: 9 days    Alex [name] 09/23/2017  . .prob

## 2017-09-23 NOTE — Progress Notes (Signed)
PROGRESS NOTE    Alex [name]  GMW:[id number] DOB: 01/26/1951 DOA: 09/13/2017 PCP: Antonietta Jewel, MD   Brief Narrative:  67 year old Caucasian male with past medical history significant for hypertension, hyperlipidemia, stroke, carotid artery stenosis, who was recently in the hospital for pneumonia and empyema and underwent surgical management. He presented with nausea, vomiting, abdominal distention as well as hypokalemia.Abdominal imaging is indicated possible ileus, although with this worsening abdominal distention, vomiting and lack of bowel movement/flatus, there is concern that he is progressing to Azaryah Oleksy bowel obstruction. NG tube placed. General surgery consulted.  Assessment & Plan:   Principal Problem:   Nausea & vomiting Active Problems:   History of stroke   Empyema (HCC)   Benign essential HTN   Abdominal distension   Dehydration   Hypokalemia   HLD (hyperlipidemia)   Nausea and vomiting   Malignant tumor of sigmoid colon (HCC)   Colon obstruction (HCC)   Nausea and vomiting secondary to large bowel obstruction  -Patient presents with nausea and vomiting, had some relief after NGT insertion CT abdomen pelvis on admission showing normal stomach and small bowels, and colon filled with liquid/air at the level of the sigmoid with mild sigmoid wall thickening, rectal contrast 4/22 showing high grade stenosis/obstruction of the distal sigmoid, with no contrast passing proximally. 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   - s/p ex lap, partial colectomy with end colostomy on 4/25 with gen surgery [ ]  follow up surgical pathology - Post op care per gen surg, NG clamped, PCA d/c'd, clears (advanced to soft diet) - Will need full colonoscopy as an outpatient to remove rectosigmoid and rectal polyps and screen unexamined  colon (planning for colonoscopy through colostomy and via rectum)  Anemia  Acute Blood Loss Anemia: holding pharm dvt ppx for now.  No active bleeding.  Brown stool in ostomy.  Hb 7 today.  Follow tomorrow.  Stable.  Iron panel notable for iron def anemia and low folate.  Low normal B12, follow MMA.  Folate, ferrous gluconate Follow MMA   Bright Red Blood Per Rectum:  Occurred prior to surgery and after sigmoidoscopy.  Pt hemodynamically stable.  Now resolved.  Likely related to biopsy of lesion from during sigmoidoscopy.  H/H downtrending postop now, but no reported bleeding.  suspect due to bx and now pt is post op.  Ctm.   Recent hx ofEmpyema [facility]):  - s/p decortication. No chest pain, cough, shortness rest, fever. Patient was started on linezolid during his last admission and antibiotics were continued until 4/23.   History of stroke:  -Resume aspirin and Zocor when able to take oral   HLD:  - Zocor when able to take oral  Hypokalemia  Hyperkalemia: stable  HTN: - blood pressure stable. hold HCTZ . IV hydralazine prn.  BP stable.  Tachycardia: improved. EKG NSR.  Elevated LFTs: continue to follow.  Hepatitis panel pending. CT abdomen with unremarkable liver previously in hospitalization  DVT prophylaxis: SCD Code Status: full  Family Communication: none at bedside Disposition Plan: pending surgery and further improvement   Consultants:   GI  Surgery  Procedures:  4/23 Flex Sig - Likely malignant completely obstructing tumor in the distal sigmoid colon. Biopsied. Tattooed. - Two 5 to 12 mm polyps in the rectum and at the recto-sigmoid colon. Resection not Attempted. - Internal hemorrhoids. - Return patient to  hospital ward for ongoing care. - Await pathology results. - NPO given colonic obstruction. - Surgical management of obstructing sigmoid cancer. - Full colonoscopy is recommended as an outpatient after recovery from colon resection to remove  recto-sigmoid and rectal polyps and screening the unexamined Colon.  4/25 ex lap and partial colectomy with end colostomy with surgery  Antimicrobials:  Anti-infectives (From admission, onward)   Start     Dose/Rate Route Frequency Ordered Stop   09/20/17 1702  cefoTEtan (CEFOTAN) 2 g in sodium chloride 0.9 % 100 mL IVPB  Status:  Discontinued     2 g 200 mL/hr over 30 Minutes Intravenous 30 min pre-op 09/20/17 1702 09/20/17 1704   09/20/17 0730  cefoTEtan (CEFOTAN) 2 g in sodium chloride 0.9 % 100 mL IVPB     2 g 200 mL/hr over 30 Minutes Intravenous 30 min pre-op 09/19/17 1156 09/20/17 0936   09/19/17 1145  cefoTEtan (CEFOTAN) 2 g in sodium chloride 0.9 % 100 mL IVPB  Status:  Discontinued     2 g 200 mL/hr over 30 Minutes Intravenous On call to O.R. 09/19/17 1144 09/19/17 1156   09/17/17 1245  linezolid (ZYVOX) IVPB 600 mg     600 mg 300 mL/hr over 60 Minutes Intravenous Every 12 hours 09/17/17 1243 09/19/17 0014   09/14/17 1000  linezolid (ZYVOX) tablet 600 mg  Status:  Discontinued     600 mg Oral 2 times daily 09/14/17 0401 09/17/17 1243     Subjective: No complaints. Sitting up in chair.  Objective: Vitals:   09/22/17 1522 09/22/17 2144 09/23/17 0300 09/23/17 1502  BP: 109/68 127/72 119/70 121/83  Pulse: 86 88 83 99  Resp: 16     Temp: 98.4 F (36.9 C) 98.6 F (37 C) 98.2 F (36.8 C) 98.6 F (37 C)  TempSrc: Oral Oral Oral Oral  SpO2: 98% 97% 98% 98%  Weight:      Height:        Intake/Output Summary (Last 24 hours) at 09/23/2017 1801 Last data filed at 09/23/2017 1502 Gross per 24 hour  Intake 1869.58 ml  Output 550 ml  Net 1319.58 ml   Filed Weights   09/14/17 0400 09/14/17 1504  Weight: 78.5 kg (173 lb 1 oz) 75.9 kg (167 lb 5.3 oz)    Examination:  General: No acute distress. Cardiovascular: Heart sounds show Annalissa Murphey regular rate, and rhythm. No gallops or rubs. No murmurs. No JVD. Lungs: Clear to auscultation bilaterally with good air movement. No  rales, rhonchi or wheezes. Abdomen: Soft, appropriately tender, nondistended with normal active bowel sounds. No masses. No hepatosplenomegaly.  Colostomy on L with stool.  Midline incision with dressing.  Neurological: Alert and oriented 3. Moves all extremities 4 with equal strength. Cranial nerves II through XII grossly intact. Skin: Warm and dry. No rashes or lesions. Extremities: No clubbing or cyanosis. No edema. Pedal pulses 2+. Psychiatric: Mood and affect are normal. Insight and judgment are appropriate.     Data Reviewed: I have personally reviewed following labs and imaging studies  CBC: Recent Labs  Lab 09/19/17 0501  09/20/17 0647 09/21/17 0459 09/22/17 0448 09/22/17 1348 09/23/17 0706  WBC 9.5  --  10.3 8.9 4.7  --  5.3  HGB 11.0*   < > 11.1* 10.8* 7.2* 7.7* 7.0*  HCT 34.7*   < > 34.9* 33.8* 22.8* 24.7* 21.7*  MCV 81.5  --  81.9 82.2 82.6  --  83.8  PLT 239  --  242 289 203  --  235   < > = values in this interval not displayed.   Basic Metabolic Panel: Recent Labs  Lab 09/17/17 0610  09/20/17 0647 09/20/17 1442 09/21/17 0459 09/21/17 1845 09/22/17 0448 09/23/17 0706  NA 133*   < > 131* 131* 133*  --  132* 134*  K 4.1   < > 5.6* 5.5* 5.8* 4.5 3.9 3.6  CL 102   < > 95* 93* 93*  --  93* 99*  CO2 22   < > 24 21* 25  --  30 27  GLUCOSE 78   < > 73 105* 101*  --  89 91  BUN 8   < > 12 13 16   --  10 6  CREATININE 0.63   < > 0.79 0.92 0.88  --  0.62 0.60*  CALCIUM 8.8*   < > 9.1 8.5* 8.4*  --  7.9* 7.8*  MG  --   --   --   --  1.9  --  2.1 2.0  PHOS 2.9  --   --   --   --   --   --   --    < > = values in this interval not displayed.   GFR: Estimated Creatinine Clearance: 82 mL/min (Duvall Comes) (by C-G formula based on SCr of 0.6 mg/dL (L)). Liver Function Tests: Recent Labs  Lab 09/17/17 0610 09/20/17 0647 09/21/17 0459 09/22/17 0448  AST  --  73* 64* 31  ALT  --  96* 93* 56  ALKPHOS  --  62 52 41  BILITOT  --  1.3* 1.5* 0.7  PROT  --  6.1* 5.6* 4.8*    ALBUMIN 3.3* 3.5 3.0* 2.4*   No results for input(s): LIPASE, AMYLASE in the last 168 hours. No results for input(s): AMMONIA in the last 168 hours. Coagulation Profile: No results for input(s): INR, PROTIME in the last 168 hours. Cardiac Enzymes: No results for input(s): CKTOTAL, CKMB, CKMBINDEX, TROPONINI in the last 168 hours. BNP (last 3 results) No results for input(s): PROBNP in the last 8760 hours. HbA1C: No results for input(s): HGBA1C in the last 72 hours. CBG: No results for input(s): GLUCAP in the last 168 hours. Lipid Profile: No results for input(s): CHOL, HDL, LDLCALC, TRIG, CHOLHDL, LDLDIRECT in the last 72 hours. Thyroid Function Tests: No results for input(s): TSH, T4TOTAL, FREET4, T3FREE, THYROIDAB in the last 72 hours. Anemia Panel: Recent Labs    09/23/17 0706  VITAMINB12 184  FOLATE 5.7*  FERRITIN 186  TIBC 206*  IRON 18*  RETICCTPCT 6.1*   Sepsis Labs: No results for input(s): PROCALCITON, LATICACIDVEN in the last 168 hours.  Recent Results (from the past 240 hour(s))  Surgical pcr screen     Status: Abnormal   Collection Time: 09/19/17  9:07 PM  Result Value Ref Range Status   MRSA, PCR POSITIVE (Braidon Chermak) NEGATIVE Final    Comment: RESULT CALLED TO, READ BACK BY AND VERIFIED WITH: Isidoro Donning RN 09/19/17 [id number] JDW    Staphylococcus aureus POSITIVE (Rankin Coolman) NEGATIVE Final    Comment: (NOTE) The Xpert SA Assay (FDA approved for NASAL specimens in patients 8 years of age and older), is one component of Ramadan Couey comprehensive surveillance program. It is not intended to diagnose infection nor to guide or monitor treatment.          Radiology Studies: No results found.      Scheduled Meds: . Chlorhexidine Gluconate Cloth  6 each Topical Q0600  . ferrous gluconate  324 mg Oral BID WC  . folic acid  1 mg Oral Daily  . mouth rinse  15 mL Mouth Rinse BID  . mupirocin ointment  1 application Nasal BID   Continuous Infusions: . sodium chloride 50 mL/hr at  09/23/17 0947  . famotidine (PEPCID) IV Stopped (09/23/17 1016)  . methocarbamol (ROBAXIN)  IV       LOS: 9 days    Time spent: over 30 min    Fayrene Helper, MD [facility] Pager [phone]  If 7PM-7AM, please contact night-coverage [email or web address] Password [email or web address] 09/23/2017, 6:01 PM

## 2017-09-23 NOTE — Plan of Care (Signed)
  Problem: Health Behavior/Discharge Planning: Goal: Ability to manage health-related needs will improve Outcome: Progressing   Problem: Clinical Measurements: Goal: Ability to maintain clinical measurements within normal limits will improve Outcome: Progressing Goal: Will remain free from infection Outcome: Progressing   

## 2017-09-24 LAB — HEPATITIS PANEL, ACUTE
HCV Ab: 1.7 s/co ratio — ABNORMAL HIGH (ref 0.0–0.9)
Hep A IgM: NEGATIVE
Hep B C IgM: NEGATIVE
Hepatitis B Surface Ag: NEGATIVE

## 2017-09-24 LAB — CBC
HEMATOCRIT: 23.8 % — AB (ref 39.0–52.0)
HEMOGLOBIN: 7.3 g/dL — AB (ref 13.0–17.0)
MCH: 26.2 pg (ref 26.0–34.0)
MCHC: 30.7 g/dL (ref 30.0–36.0)
MCV: 85.3 fL (ref 78.0–100.0)
Platelets: 281 10*3/uL (ref 150–400)
RBC: 2.79 MIL/uL — ABNORMAL LOW (ref 4.22–5.81)
RDW: 17.2 % — ABNORMAL HIGH (ref 11.5–15.5)
WBC: 6.9 10*3/uL (ref 4.0–10.5)

## 2017-09-24 LAB — COMPREHENSIVE METABOLIC PANEL
ALBUMIN: 2.5 g/dL — AB (ref 3.5–5.0)
ALT: 33 U/L (ref 17–63)
AST: 20 U/L (ref 15–41)
Alkaline Phosphatase: 43 U/L (ref 38–126)
Anion gap: 8 (ref 5–15)
BILIRUBIN TOTAL: 0.7 mg/dL (ref 0.3–1.2)
BUN: 5 mg/dL — ABNORMAL LOW (ref 6–20)
CHLORIDE: 102 mmol/L (ref 101–111)
CO2: 26 mmol/L (ref 22–32)
Calcium: 8.2 mg/dL — ABNORMAL LOW (ref 8.9–10.3)
Creatinine, Ser: 0.62 mg/dL (ref 0.61–1.24)
GFR calc Af Amer: >60 mL/min (ref >60)
GFR calc non Af Amer: >60 mL/min (ref >60)
GLUCOSE: 103 mg/dL — AB (ref 65–99)
POTASSIUM: 3.9 mmol/L (ref 3.5–5.1)
SODIUM: 136 mmol/L (ref 135–145)
Total Protein: 4.9 g/dL — ABNORMAL LOW (ref 6.5–8.1)

## 2017-09-24 LAB — TYPE AND SCREEN
ABO/RH(D): A POS
ANTIBODY SCREEN: NEGATIVE

## 2017-09-24 MED ORDER — ADULT MULTIVITAMIN W/MINERALS CH
1.0000 | ORAL_TABLET | Freq: Every day | ORAL | Status: DC
  Administered 2017-09-24 – 2017-09-25 (×2): 1 via ORAL
  Filled 2017-09-24 (×2): qty 1

## 2017-09-24 MED ORDER — POLYETHYLENE GLYCOL 3350 17 G PO PACK
17.0000 g | PACK | Freq: Every day | ORAL | Status: DC
  Administered 2017-09-24 – 2017-09-25 (×2): 17 g via ORAL
  Filled 2017-09-24 (×2): qty 1

## 2017-09-24 MED ORDER — ENSURE ENLIVE PO LIQD
237.0000 mL | Freq: Two times a day (BID) | ORAL | Status: DC
  Administered 2017-09-24 – 2017-09-25 (×3): 237 mL via ORAL

## 2017-09-24 MED ORDER — FAMOTIDINE 20 MG PO TABS
20.0000 mg | ORAL_TABLET | Freq: Two times a day (BID) | ORAL | Status: DC
  Administered 2017-09-24 – 2017-09-25 (×2): 20 mg via ORAL
  Filled 2017-09-24 (×2): qty 1

## 2017-09-24 MED ORDER — DOCUSATE SODIUM 100 MG PO CAPS
100.0000 mg | ORAL_CAPSULE | Freq: Every day | ORAL | Status: DC
  Administered 2017-09-24 – 2017-09-25 (×2): 100 mg via ORAL
  Filled 2017-09-24 (×2): qty 1

## 2017-09-24 NOTE — Care Management Note (Signed)
Case Management Note  Patient Details  Name: Alex [name] MRN: [id number] Date of Birth: June 08, 1950  Subjective/Objective:                    Action/Plan:  Confirmed face sheet information, including PCP HASSAN, SAMI . Patient is uninsured , explained on day of discharge case manager will provide [place] letter ( medication assistance).   Patient lives with son, has 2 sisters who live less than a mile away and has a daughter who will assist at home.   Expected Discharge Date:                  Expected Discharge Plan:  [place]  In-House Referral:  Financial Counselor  Discharge planning Services  CM Consult  Post Acute Care Choice:  Home Health Choice offered to:  Patient  DME Arranged:  N/A DME Agency:  NA  HH Arranged:  RN [place] Agency:  [place]  Status of Service:  In process, will continue to follow  If discussed at Long Length of Stay Meetings, dates discussed:    Additional Comments:  Marilu Favre, RN 09/24/2017, 1:53 PM

## 2017-09-24 NOTE — Consult Note (Signed)
[place] Nurse wound follow up Wound type: surgical  CCS requested for [place] nurse to remove optiview dressing.  Dressing removed and incision is clean, dry and intact.  Covered with dry dressing   [facility] Nurse ostomy follow up Stoma type/location: LLQ, end colostomy Stomal assessment/size: 2" round, moist and budded  Peristomal assessment: intact  Treatment options for stomal/peristomal skin: NA Output: liquid brown stool Ostomy pouching: 1pc  Education provided:  Explained stoma characteristics (budded, flush, color, texture, care) Demonstrated and allowed patient to participate in pouch change (cutting new skin barrier, measuring stoma, cleaning peristomal skin and stoma, use of barrier ring).  Patient cut a hole in the first pouch on his first attempt.  Education on emptying when 1/3 to 1/2 full and how to empty Demonstrated use of wick to clean spout   Answered patient/family questions:   Patient reports he has very supportive family that will assist him, however they are not present today and have not been present for his other teaching sessions.   Enrolled patient in [place] Start Discharge program: Yes  [place] Nurse will follow along with you for continued support with ostomy teaching and care [place] [facility], [place], [place], [place], [place]

## 2017-09-24 NOTE — Progress Notes (Signed)
4 Days Post-Op Procedure(s) (LRB): OPEN PARTIAL COLECTOMY WITH COLOSTOMY (N/A) Subjective: No pulmonary problems after partial colectomy for obstructing cancer- recent R VATS for empyema Will check CXR tomorrow Objective: Vital signs in last 24 hours: Temp:  [97.7 F (36.5 C)-98.6 F (37 C)] 97.7 F (36.5 C) (04/29 0433) Pulse Rate:  [77-99] 77 (04/29 0433) BP: (121-124)/(76-83) 124/76 (04/29 0433) SpO2:  [96 %-98 %] 96 % (04/29 0433)  Hemodynamic parameters for last 24 hours:  stable  Intake/Output from previous day: 04/28 0701 - 04/29 0700 In: 955.8 [P.O.:300; I.V.:555.8; IV Piggyback:100] Out: 1550 [Urine:1200; Stool:350] Intake/Output this shift: Total I/O In: 240 [P.O.:240] Out: -   breath sounds clear  R VATS incision clean, dry  Lab Results: Recent Labs    09/23/17 0706 09/24/17 0704  WBC 5.3 6.9  HGB 7.0* 7.3*  HCT 21.7* 23.8*  PLT 235 281   BMET:  Recent Labs    09/23/17 0706 09/24/17 0704  NA 134* 136  K 3.6 3.9  CL 99* 102  CO2 27 26  GLUCOSE 91 103*  BUN 6 5*  CREATININE 0.60* 0.62  CALCIUM 7.8* 8.2*    PT/INR: No results for input(s): LABPROT, INR in the last 72 hours. ABG    Component Value Date/Time   PHART 7.396 08/31/2017 0702   HCO3 27.6 08/31/2017 0702   TCO2 29 08/31/2017 0702   O2SAT 98.0 08/31/2017 0702   CBG (last 3)  No results for input(s): GLUCAP in the last 72 hours.  Assessment/Plan: S/P Procedure(s) (LRB): OPEN PARTIAL COLECTOMY WITH COLOSTOMY (N/A) CXR in am- will followup in office   LOS: 10 days    Tharon Aquas Trigt III 09/24/2017

## 2017-09-24 NOTE — Progress Notes (Signed)
[facility]/Trauma Progress Note  4 Days Post-Op   Assessment/Plan Large bowel obstruction - s/p VATS/decortication 08/30/17 - CT scan 4/19 Colon is filled with liquid/air to the level of the sigmoid. Mild sigmoid wall thickening. - rectal contrast study 4/22 showed high grade stenosis/obstruction of distal sigmoid. - S/P flex sig, Dr. 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 Hilarie Fredrickson expected to be malignant - POD#4 - S/Pexploratory laparotomy, and partial colectomy (rectosigmoid) with end colostomy, Dr. Kae Heller, 04/25   Acute blood loss anemia. Significant variance without alteration in vital signs. No external signs of bleeding.  Hemoglobin 7.3 this morning up from 7.0.  Doubt ongoing blood loss.  Anemia is being tolerated well. - continue to hold Lovenox -Start ferrous gluconate -Check CBC tomorrow   FEN: IVF, soft diet VTE: SCD's(HOLD LOVENOX) ID: Linezolid per CT surgery, Cefotetan Pre-op Foley: none Follow up: TBD  DISPO:stool output in bag. CBC in am. Bowel regiment.    LOS: 10 days    Subjective: CC: anemia  Pt states no pain at this time. He is tolerating a diet. No nausea or vomiting, fever or chills. He states mild dizziness when he first stands and starts to walk but this improves. No issues overnight. No family at bedside.   Objective: Vital signs in last 24 hours: Temp:  [97.7 F (36.5 C)-98.6 F (37 C)] 97.7 F (36.5 C) (04/29 0433) Pulse Rate:  [77-99] 77 (04/29 0433) BP: (121-124)/(76-83) 124/76 (04/29 0433) SpO2:  [96 %-98 %] 96 % (04/29 0433) Last BM Date: 09/22/17  Intake/Output from previous day: 04/28 0701 - 04/29 0700 In: 955.8 [P.O.:300; I.V.:555.8; IV Piggyback:100] Out: 1550 [Urine:1200; Stool:350] Intake/Output this shift: Total I/O In: 240 [P.O.:240] Out: -    PE: Gen: Alert, NAD, pleasant, cooperative Pulm:Rate andeffort normal Abd: Soft,no distention,+BS, honeycomb dressing over the midline incision, brown liquid stool in colostomy bag, ostomy pink, no TTP  Skin: no rashes noted, warm and dry  Anti-infectives: Anti-infectives (From admission, onward)   Start     Dose/Rate Route Frequency Ordered Stop   09/20/17 1702  cefoTEtan (CEFOTAN) 2 g in sodium chloride 0.9 % 100 mL IVPB  Status:  Discontinued     2 g 200 mL/hr over 30 Minutes Intravenous 30 min pre-op 09/20/17 1702 09/20/17 1704   09/20/17 0730  cefoTEtan (CEFOTAN) 2 g in sodium chloride 0.9 % 100 mL IVPB     2 g 200 mL/hr over 30 Minutes Intravenous 30 min pre-op 09/19/17 1156 09/20/17 0936   09/19/17 1145  cefoTEtan (CEFOTAN) 2 g in sodium chloride 0.9 % 100 mL IVPB  Status:  Discontinued     2 g 200 mL/hr over 30 Minutes Intravenous On call to O.R. 09/19/17 1144 09/19/17 1156   09/17/17 1245  linezolid (ZYVOX) IVPB 600 mg     600 mg 300 mL/hr over 60 Minutes Intravenous Every 12 hours 09/17/17 1243 09/19/17 0014   09/14/17 1000  linezolid (ZYVOX) tablet 600 mg  Status:  Discontinued     600 mg Oral 2 times daily 09/14/17 0401 09/17/17 1243      Lab Results:  Recent Labs    09/23/17 0706 09/24/17 0704  WBC 5.3 6.9  HGB 7.0* 7.3*  HCT 21.7* 23.8*  PLT 235 281   BMET Recent Labs    09/22/17 0448 09/23/17 0706  NA 132* 134*  K 3.9 3.6  CL 93* 99*  CO2 30 27  GLUCOSE 89 91  BUN 10 6  CREATININE 0.62 0.60*  CALCIUM 7.9* 7.8*   PT/INR No results for input(s): LABPROT, INR in the last 72 hours. CMP     Component Value Date/Time   NA 134 (L) 09/23/2017 0706   K 3.6 09/23/2017 0706   CL 99 (L) 09/23/2017 0706   CO2 27 09/23/2017 0706   GLUCOSE 91 09/23/2017 0706   BUN 6 09/23/2017 0706   CREATININE 0.60 (L) 09/23/2017 0706   CALCIUM 7.8 (L) 09/23/2017 0706   PROT 4.8 (L) 09/22/2017 0448   ALBUMIN 2.4 (L) 09/22/2017 0448   AST 31 09/22/2017 0448    ALT 56 09/22/2017 0448   ALKPHOS 41 09/22/2017 0448   BILITOT 0.7 09/22/2017 0448   GFRNONAA >60 09/23/2017 0706   GFRAA >60 09/23/2017 0706   Lipase     Component Value Date/Time   LIPASE 37 09/13/2017 2127    Studies/Results: No results found.    Kalman Drape , [facility] 09/24/2017, 9:21 AM  Pager: [phone] Mon-Wed, Friday 7:00am-4:30pm Thurs 7am-11:30am  Consults: [phone]

## 2017-09-24 NOTE — Discharge Instructions (Addendum)
[facility] Stay Proper nutrition can help your body recover from illness and injury.   Foods and beverages high in protein, vitamins, and minerals help rebuild muscle loss, promote healing, & reduce fall risk.   In addition to eating healthy foods, a nutrition shake is an easy, delicious way to get the nutrition you need during and after your hospital stay  It is recommended that you continue to drink 2 bottles per day of:       Ensure Enlive/ Boost Plus for at least 1 month (30 days) after your hospital stay   Tips for adding a nutrition shake into your routine: As allowed, drink one with vitamins or medications instead of water or juice Enjoy one as a tasty mid-morning or afternoon snack Drink cold or make a milkshake out of it Drink one instead of milk with cereal or snacks Use as a coffee creamer   Available at the following grocery stores and pharmacies:           * [place] [phone]            For COUPONS visit: www.ensure.com/join or [email or web address]-may.[email or web address]   Suggested Substitutions Ensure Plus = Boost Plus = Carnation Breakfast Essentials = Boost Compact Ensure Active Clear = Boost Breeze Glucerna Shake = Boost Glucose Control = Carnation Breakfast Essentials SUGAR FREE    CCS      [facility], [place] [id number]  OPEN ABDOMINAL SURGERY: POST OP INSTRUCTIONS  Always review your discharge instruction sheet given to you by the facility where your surgery was performed.  IF YOU HAVE DISABILITY OR FAMILY LEAVE FORMS, YOU MUST BRING THEM TO THE OFFICE FOR PROCESSING.  PLEASE DO NOT GIVE THEM TO YOUR DOCTOR.  1. A prescription for pain medication may be given to you upon discharge.  Take your pain medication as prescribed, if needed.  If narcotic pain medicine is not needed, then  you may take acetaminophen (Tylenol) or ibuprofen (Advil) as needed. 2. Take your usually prescribed medications unless otherwise directed. 3. If you need a refill on your pain medication, please contact your pharmacy. They will contact our office to request authorization.  Prescriptions will not be filled after 5pm or on week-ends. 4. You should follow a light diet the first few days after arrival home, such as soup and crackers, pudding, etc.unless your doctor has advised otherwise. A high-fiber, low fat diet can be resumed as tolerated.   Be sure to include lots of fluids daily. Most patients will experience some swelling and bruising on the chest and neck area.  Ice packs will help.  Swelling and bruising can take several days to resolve 5. Most patients will experience some swelling and bruising in the area of the incision. Ice pack will help. Swelling and bruising can take several days to resolve..  6. It is common to experience some constipation if taking pain medication after surgery.  Increasing fluid intake and taking a stool softener will usually help or prevent this problem from occurring.  A mild laxative (Milk of Magnesia or Miralax) should be taken according to package directions if there are no bowel movements after 48 hours. 7.  You may have steri-strips (small  skin tapes) in place directly over the incision.  These strips should be left on the skin for 7-10 days.  If your surgeon used skin glue on the incision, you may shower in 24 hours.  The glue will flake off over the next 2-3 weeks.  Any sutures or staples will be removed at the office during your follow-up visit. You may find that a light gauze bandage over your incision may keep your staples from being rubbed or pulled. You may shower and replace the bandage daily. 8. ACTIVITIES:  You may resume regular (light) daily activities beginning the next day--such as daily self-care, walking, climbing stairs--gradually increasing activities  as tolerated.  You may have sexual intercourse when it is comfortable.  Refrain from any heavy lifting or straining until approved by your doctor. a. You may drive when you no longer are taking prescription pain medication, you can comfortably wear a seatbelt, and you can safely maneuver your car and apply brakes b. Return to Work: ___________________________________ 28. You should see your doctor in the office for a follow-up appointment approximately two weeks after your surgery.  Make sure that you call for this appointment within a day or two after you arrive home to insure a convenient appointment time. OTHER INSTRUCTIONS:  _____________________________________________________________ _____________________________________________________________  WHEN TO CALL YOUR DOCTOR: 1. Fever over 101.0 2. Inability to urinate 3. Nausea and/or vomiting 4. Extreme swelling or bruising 5. Continued bleeding from incision. 6. Increased pain, redness, or drainage from the incision. 7. Difficulty swallowing or breathing 8. Muscle cramping or spasms. 9. Numbness or tingling in hands or feet or around lips.  The clinic staff is available to answer your questions during regular business hours.  Please dont hesitate to call and ask to speak to one of the nurses if you have concerns.  For further questions, please visit [email or web address]    Colostomy, Adult, Care After Refer to this sheet in the next few weeks. These instructions provide you with information about caring for yourself after your procedure. Your health care provider may also give you more specific instructions. Your treatment has been planned according to current medical practices, but problems sometimes occur. Call your health care provider if you have any problems or questions after your procedure. What can I expect after the procedure? After the procedure, it is common to have:  Swelling at the opening that was created  during the procedure (stoma).  Slight bleeding around the stoma.  Redness around the stoma.  Follow these instructions at home: Activity  Rest as needed while the stoma area heals.  Return to your normal activities as told by your health care provider. Ask your health care provider what activities are safe for you.  Avoid strenuous activity and abdominal exercises for 3 weeks or for as long as told by your health care provider.  Do not lift anything that is heavier than 10 lb (4.5 kg). Incision care   Follow instructions from your health care provider about how to take care of your incision. Make sure you: ? Wash your hands with soap and water before you change your bandage (dressing). If soap and water are not available, use hand sanitizer. ? Change your dressing as told by your health care provider. ? Leave stitches (sutures), skin glue, or adhesive strips in place. These skin closures may need to stay in place for 2 weeks or longer. If adhesive strip edges start to loosen and curl up, you may trim the loose edges. Do not  remove adhesive strips completely unless your health care provider tells you to do that. Stoma Care  Keep the stoma area clean.  Clean and dry the skin around the stoma each time you change the colostomy bag. To clean the stoma area: ? Use warm water and only use cleansers that are recommended by your health care provider. ? Rinse the stoma area with plain water. ? Dry the area well.  Use stoma powder or ointment on your skin only as told by your health care provider. Do not use any other powders, gels, wipes, or creams on your skin.  Check the stoma area every day for signs of infection. Check for: ? More redness, swelling, or pain. ? More fluid or blood. ? Pus or warmth.  Measure the stoma opening regularly and record the size. Watch for changes. Share this information with your health care provider. Bathing  Do not take baths, swim, or use a hot tub  until your health care provider approves. Ask your health care provider if you can take showers. You may be able to shower with or without the colostomy bag in place. If you bathe with the bag on, dry the bag afterward.  Avoid using harsh or oily soaps when you bathe. Colostomy Bag Care  Follow instructions from your health care provider about how to empty or change the colostomy bag.  Keep colostomy supplies with you at all times.  Store all supplies in a cool, dry place.  Empty the colostomy bag: ? Whenever it is one-third to one-half full. ? At bedtime.  Replace the bag every 2-4 days or as told by your health care provider. Driving  Do not drive for 24 hours if you received a sedative.  Do not drive or operate heavy machinery while taking prescription pain medicine. General instructions  Follow instructions from your health care provider about eating or drinking restrictions.  Take over-the-counter and prescription medicines only as told by your health care provider.  Avoid wearing clothes that are tight directly over your stoma.  Do not use any tobacco products, such as cigarettes, chewing tobacco, and e-cigarettes. If you need help quitting, ask your health care provider.  (Women) Ask your health care provider about becoming pregnant and about using birth control. Medicines may not be absorbed normally after the procedure.  Keep all follow-up visits as told by your health care provider. This is important. Contact a health care provider if:  You are having trouble caring for your stoma or changing the colostomy bag.  You feel nauseous or you vomit.  You have a fever.  You havemore redness, swelling, or pain at the site of your stoma or around your anus.  You have more fluid or blood coming from your stoma or your anus.  Your stoma area feels warm to the touch.  You have pus coming from your stoma.  You notice a change in the size or appearance of the  stoma.  You have abdominal pain, bloating, pressure, or cramping.  Your have stool more often or less often than your health care provider tells you to expect.  You are not making much urine. This may be a sign of dehydration. Get help right away if:  Your abdominal pain does not go away or it becomes severe.  You keep vomiting.  Your stool is not draining through the stoma.  You have chest pain or an irregular heartbeat. This information is not intended to replace advice given to you by your  health care provider. Make sure you discuss any questions you have with your health care provider. Document Released: 10/05/2010 Document Revised: 09/23/2015 Document Reviewed: 01/26/2015 Elsevier Interactive Patient Education  2018 [employer].

## 2017-09-24 NOTE — Progress Notes (Signed)
PROGRESS NOTE    Alex [name]  GQQ:[id number] DOB: 11-22-1950 DOA: 09/13/2017 PCP: Antonietta Jewel, MD   Brief Narrative:  67 year old Caucasian male with past medical history significant for hypertension, hyperlipidemia, stroke, carotid artery stenosis, who was recently in the hospital for pneumonia and empyema and underwent surgical management. He presented with nausea, vomiting, abdominal distention as well as hypokalemia.Abdominal imaging is indicated possible ileus, although with this worsening abdominal distention, vomiting and lack of bowel movement/flatus, there is concern that he is progressing to a bowel obstruction. NG tube placed. General surgery consulted.  Assessment & Plan:   Principal Problem:   Nausea & vomiting Active Problems:   History of stroke   Empyema (HCC)   Benign essential HTN   Abdominal distension   Dehydration   Hypokalemia   HLD (hyperlipidemia)   Nausea and vomiting   Malignant tumor of sigmoid colon (HCC)   Colon obstruction (HCC)   Nausea and vomiting secondary to large bowel obstruction  -Patient presents with nausea and vomiting, had some relief after NGT insertion CT abdomen pelvis on admission showing normal stomach and small bowels, and colon filled with liquid/air at the level of the sigmoid with mild sigmoid wall thickening, rectal contrast 4/22 showing high grade stenosis/obstruction of the distal sigmoid, with no contrast passing proximally. 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   - s/p ex lap, partial colectomy with end colostomy on 4/25 with gen surgery [ ]  follow up surgical pathology (invasive adenocarcinoma) -> will discuss with oncology for follow up.  Will need to discuss with pt. - Post op care per gen surg, NG clamped, PCA d/c'd, clears (advanced to soft diet) - Will  need full colonoscopy as an outpatient to remove rectosigmoid and rectal polyps and screen unexamined colon (planning for colonoscopy through colostomy and via rectum)  Anemia  Acute Blood Loss Anemia: holding pharm dvt ppx for now.  No active bleeding.  Brown stool in ostomy.  Hb 7 today.  Follow tomorrow.  Stable.  Iron panel notable for iron def anemia and low folate.  Low normal B12, follow MMA.  Folate, ferrous gluconate Follow MMA   Bright Red Blood Per Rectum:  Occurred prior to surgery and after sigmoidoscopy.  Pt hemodynamically stable.  Now resolved.  Likely related to biopsy of lesion from during sigmoidoscopy.  H/H downtrending postop now, but no reported bleeding.  suspect due to bx and now pt is post op.  Ctm.   Recent hx ofEmpyema [facility]):  - s/p decortication. No chest pain, cough, shortness rest, fever. Patient was started on linezolid during his last admission and antibiotics were continued until 4/23 [ ]  repeat CXR per CT surg.   History of stroke:  -Resume aspirin and Zocor when able to take oral   HLD:  - Zocor when able to take oral  Hypokalemia  Hyperkalemia: stable  HTN: - blood pressure stable. hold HCTZ . IV hydralazine prn.  BP stable.  Tachycardia: improved. EKG NSR.  Elevated LFTs  Positive Hepatitis C Ab: Follow PCR.  continue to follow.  Hepatitis panel with positive Hep C Ab. CT abdomen with unremarkable liver previously in hospitalization  DVT prophylaxis: SCD Code Status: full  Family Communication: none at bedside Disposition Plan: pending surgery and further improvement   Consultants:   GI  Surgery  Procedures:  4/23 Flex Sig - Likely  malignant completely obstructing tumor in the distal sigmoid colon. Biopsied. Tattooed. - Two 5 to 12 mm polyps in the rectum and at the recto-sigmoid colon. Resection not Attempted. - Internal hemorrhoids. - Return patient to hospital ward for ongoing care. - Await pathology results. - NPO  given colonic obstruction. - Surgical management of obstructing sigmoid cancer. - Full colonoscopy is recommended as an outpatient after recovery from colon resection to remove recto-sigmoid and rectal polyps and screening the unexamined Colon.  4/25 ex lap and partial colectomy with end colostomy with surgery  Antimicrobials:  Anti-infectives (From admission, onward)   Start     Dose/Rate Route Frequency Ordered Stop   09/20/17 1702  cefoTEtan (CEFOTAN) 2 g in sodium chloride 0.9 % 100 mL IVPB  Status:  Discontinued     2 g 200 mL/hr over 30 Minutes Intravenous 30 min pre-op 09/20/17 1702 09/20/17 1704   09/20/17 0730  cefoTEtan (CEFOTAN) 2 g in sodium chloride 0.9 % 100 mL IVPB     2 g 200 mL/hr over 30 Minutes Intravenous 30 min pre-op 09/19/17 1156 09/20/17 0936   09/19/17 1145  cefoTEtan (CEFOTAN) 2 g in sodium chloride 0.9 % 100 mL IVPB  Status:  Discontinued     2 g 200 mL/hr over 30 Minutes Intravenous On call to O.R. 09/19/17 1144 09/19/17 1156   09/17/17 1245  linezolid (ZYVOX) IVPB 600 mg     600 mg 300 mL/hr over 60 Minutes Intravenous Every 12 hours 09/17/17 1243 09/19/17 0014   09/14/17 1000  linezolid (ZYVOX) tablet 600 mg  Status:  Discontinued     600 mg Oral 2 times daily 09/14/17 0401 09/17/17 1243     Subjective: No complaints. Sitting up in chair.  Objective: Vitals:   09/23/17 1502 09/23/17 2151 09/24/17 0433 09/24/17 1428  BP: 121/83 121/76 124/76 (!) 130/92  Pulse: 99 80 77 75  Resp:      Temp: 98.6 F (37 C) 98.1 F (36.7 C) 97.7 F (36.5 C) 97.7 F (36.5 C)  TempSrc: Oral Oral Oral Oral  SpO2: 98% 98% 96% 100%  Weight:      Height:        Intake/Output Summary (Last 24 hours) at 09/24/2017 1856 Last data filed at 09/24/2017 1700 Gross per 24 hour  Intake 1270 ml  Output 1900 ml  Net -630 ml   Filed Weights   09/14/17 0400 09/14/17 1504  Weight: 78.5 kg (173 lb 1 oz) 75.9 kg (167 lb 5.3 oz)    Examination:  General: No acute  distress.  Sitting up in chair.  Cardiovascular: Heart sounds show a regular rate, and rhythm. No gallops or rubs. No murmurs. No JVD. Lungs: Clear to auscultation bilaterally with good air movement. No rales, rhonchi or wheezes. Abdomen: Soft, nontender, nondistended with normal active bowel sounds. No masses. No hepatosplenomegaly.  Colostomy on R.  Neurological: Alert and oriented 3. Moves all extremities 4 with equal strength. Cranial nerves II through XII grossly intact. Skin: Warm and dry. No rashes or lesions. Extremities: No clubbing or cyanosis. No edema.  Psychiatric: Mood and affect are normal. Insight and judgment are appropriate.      Data Reviewed: I have personally reviewed following labs and imaging studies  CBC: Recent Labs  Lab 09/20/17 0647 09/21/17 0459 09/22/17 0448 09/22/17 1348 09/23/17 0706 09/24/17 0704  WBC 10.3 8.9 4.7  --  5.3 6.9  HGB 11.1* 10.8* 7.2* 7.7* 7.0* 7.3*  HCT 34.9* 33.8* 22.8* 24.7* 21.7* 23.8*  MCV 81.9 82.2 82.6  --  83.8 85.3  PLT 242 289 203  --  235 518   Basic Metabolic Panel: Recent Labs  Lab 09/20/17 1442 09/21/17 0459 09/21/17 1845 09/22/17 0448 09/23/17 0706 09/24/17 0704  NA 131* 133*  --  132* 134* 136  K 5.5* 5.8* 4.5 3.9 3.6 3.9  CL 93* 93*  --  93* 99* 102  CO2 21* 25  --  30 27 26   GLUCOSE 105* 101*  --  89 91 103*  BUN 13 16  --  10 6 5*  CREATININE 0.92 0.88  --  0.62 0.60* 0.62  CALCIUM 8.5* 8.4*  --  7.9* 7.8* 8.2*  MG  --  1.9  --  2.1 2.0  --    GFR: Estimated Creatinine Clearance: 82 mL/min (by C-G formula based on SCr of 0.62 mg/dL). Liver Function Tests: Recent Labs  Lab 09/20/17 0647 09/21/17 0459 09/22/17 0448 09/24/17 0704  AST 73* 64* 31 20  ALT 96* 93* 56 33  ALKPHOS 62 52 41 43  BILITOT 1.3* 1.5* 0.7 0.7  PROT 6.1* 5.6* 4.8* 4.9*  ALBUMIN 3.5 3.0* 2.4* 2.5*   No results for input(s): LIPASE, AMYLASE in the last 168 hours. No results for input(s): AMMONIA in the last 168  hours. Coagulation Profile: No results for input(s): INR, PROTIME in the last 168 hours. Cardiac Enzymes: No results for input(s): CKTOTAL, CKMB, CKMBINDEX, TROPONINI in the last 168 hours. BNP (last 3 results) No results for input(s): PROBNP in the last 8760 hours. HbA1C: No results for input(s): HGBA1C in the last 72 hours. CBG: No results for input(s): GLUCAP in the last 168 hours. Lipid Profile: No results for input(s): CHOL, HDL, LDLCALC, TRIG, CHOLHDL, LDLDIRECT in the last 72 hours. Thyroid Function Tests: No results for input(s): TSH, T4TOTAL, FREET4, T3FREE, THYROIDAB in the last 72 hours. Anemia Panel: Recent Labs    09/23/17 0706  VITAMINB12 184  FOLATE 5.7*  FERRITIN 186  TIBC 206*  IRON 18*  RETICCTPCT 6.1*   Sepsis Labs: No results for input(s): PROCALCITON, LATICACIDVEN in the last 168 hours.  Recent Results (from the past 240 hour(s))  Surgical pcr screen     Status: Abnormal   Collection Time: 09/19/17  9:07 PM  Result Value Ref Range Status   MRSA, PCR POSITIVE (A) NEGATIVE Final    Comment: RESULT CALLED TO, READ BACK BY AND VERIFIED WITH: Isidoro Donning RN 09/19/17 [id number] JDW    Staphylococcus aureus POSITIVE (A) NEGATIVE Final    Comment: (NOTE) The Xpert SA Assay (FDA approved for NASAL specimens in patients 63 years of age and older), is one component of a comprehensive surveillance program. It is not intended to diagnose infection nor to guide or monitor treatment.          Radiology Studies: No results found.      Scheduled Meds: . docusate sodium  100 mg Oral Daily  . famotidine  20 mg Oral BID  . feeding supplement (ENSURE ENLIVE)  237 mL Oral BID BM  . ferrous gluconate  324 mg Oral BID WC  . folic acid  1 mg Oral Daily  . mouth rinse  15 mL Mouth Rinse BID  . multivitamin with minerals  1 tablet Oral Daily  . mupirocin ointment  1 application Nasal BID  . polyethylene glycol  17 g Oral Daily   Continuous Infusions: . sodium  chloride 50 mL/hr at 09/24/17 0556  . methocarbamol (ROBAXIN)  IV  LOS: 10 days    Time spent: over 30 min    Fayrene Helper, MD [facility] Pager [phone]  If 7PM-7AM, please contact night-coverage [email or web address] Password [facility] 09/24/2017, 6:56 PM

## 2017-09-24 NOTE — Progress Notes (Signed)
Initial Nutrition Assessment  DOCUMENTATION CODES:   Not applicable  INTERVENTION:   -Ensure Enlive po BID, each supplement provides 350 kcal and 20 grams of protein  NUTRITION DIAGNOSIS:   Increased nutrient needs related to post-op healing as evidenced by estimated needs.  GOAL:   Patient will meet greater than or equal to 90% of their needs  MONITOR:   PO intake, Supplement acceptance, Labs, Weight trends, Skin, I & O's  REASON FOR ASSESSMENT:   LOS    ASSESSMENT:   Alex [name] is a 67 y.o. male with medical history significant of hypertension, hyperlipidemia, stroke, carotid artery stenosis, recent pneumonia and empyema, who presents with nausea, vomiting, abdominal distention  4/21-NGT placed 4/22- rectal contrast study revealed high grade stenosis/obstruction of distal sigmoid 4/23- s/p flex sigmoidoscopy- pathology revealed adenoma with high-grade dysplagia 4/25- s/p partial colectomy and colostomy 4/26- NGT d/c  Case discussed with RN, who reports good colostomy output as well as good appetite this morning. He tolerated his breakfast well.   Spoke with pt at bedside, who reports decreased appetite over the past 2 weeks after discharge from prior hospitalization on 09/04/17 due to rt empyema. Pt reports that he tried to eat, but had extreme difficulty keeping foods down and was experiencing abdominal pain. He stated that he was consuming Boost 1-2 times per day to assist with adequate intake.   Pt endorses weight loss over the past month, which he attributes to poor appetite and multiple hospitalizations. He estimates UBW around 183-190#. Noted pt has experienced a 3.4% wt loss over the past month, which is not significant for time frame, but concerning given hx of poor oral intake.   Pt reports good mobility PTA and has been doing well walking the hallways. Discussed importance of good meal and supplement intake to promote healing.   Pt at high risk for  malnutrition, however, unable to identify at this time. Pt amenable to continue supplements at home.   Labs reviewed.   NUTRITION - FOCUSED PHYSICAL EXAM:    Most Recent Value  Orbital Region  Mild depletion  Upper Arm Region  Mild depletion  Thoracic and Lumbar Region  No depletion  Buccal Region  No depletion  Temple Region  Mild depletion  Clavicle Bone Region  No depletion  Clavicle and Acromion Bone Region  No depletion  Scapular Bone Region  No depletion  Dorsal Hand  Mild depletion  Patellar Region  Mild depletion  Anterior Thigh Region  Mild depletion  Posterior Calf Region  Mild depletion  Edema (RD Assessment)  None  Hair  Reviewed  Eyes  Reviewed  Mouth  Reviewed  Skin  Reviewed  Nails  Reviewed       Diet Order:  DIET SOFT Room service appropriate? Yes; Fluid consistency: Thin  EDUCATION NEEDS:   Education needs have been addressed  Skin:  Skin Assessment: Skin Integrity Issues: Skin Integrity Issues:: Incisions Incisions: closed abdomen, rt chest  Last BM:  09/24/17 (150 ml output via colostomy)  Height:   Ht Readings from Last 1 Encounters:  09/14/17 5\' 6"  (1.676 m)    Weight:   Wt Readings from Last 1 Encounters:  09/14/17 167 lb 5.3 oz (75.9 kg)    Ideal Body Weight:  64.5 kg  BMI:  Body mass index is 27.01 kg/m.  Estimated Nutritional Needs:   Kcal:  1900-2100  Protein:  100-115 grams  Fluid:  >1.9 L    Alex [name], RD, LDN, CDE Pager: [phone]  After hours Pager: [phone]

## 2017-09-25 ENCOUNTER — Telehealth: Payer: Self-pay | Admitting: Oncology

## 2017-09-25 ENCOUNTER — Inpatient Hospital Stay (HOSPITAL_COMMUNITY): Payer: Self-pay

## 2017-09-25 ENCOUNTER — Encounter: Payer: Self-pay | Admitting: Oncology

## 2017-09-25 DIAGNOSIS — R112 Nausea with vomiting, unspecified: Secondary | ICD-10-CM

## 2017-09-25 DIAGNOSIS — C187 Malignant neoplasm of sigmoid colon: Secondary | ICD-10-CM

## 2017-09-25 LAB — COMPREHENSIVE METABOLIC PANEL
ALK PHOS: 46 U/L (ref 38–126)
ALT: 30 U/L (ref 17–63)
AST: 24 U/L (ref 15–41)
Albumin: 2.7 g/dL — ABNORMAL LOW (ref 3.5–5.0)
Anion gap: 6 (ref 5–15)
BILIRUBIN TOTAL: 0.7 mg/dL (ref 0.3–1.2)
BUN: 5 mg/dL — AB (ref 6–20)
CALCIUM: 8.4 mg/dL — AB (ref 8.9–10.3)
CHLORIDE: 103 mmol/L (ref 101–111)
CO2: 28 mmol/L (ref 22–32)
Creatinine, Ser: 0.69 mg/dL (ref 0.61–1.24)
Glucose, Bld: 104 mg/dL — ABNORMAL HIGH (ref 65–99)
Potassium: 4 mmol/L (ref 3.5–5.1)
Sodium: 137 mmol/L (ref 135–145)
TOTAL PROTEIN: 5.3 g/dL — AB (ref 6.5–8.1)

## 2017-09-25 LAB — CBC
HEMATOCRIT: 24.5 % — AB (ref 39.0–52.0)
Hemoglobin: 7.6 g/dL — ABNORMAL LOW (ref 13.0–17.0)
MCH: 26.1 pg (ref 26.0–34.0)
MCHC: 31 g/dL (ref 30.0–36.0)
MCV: 84.2 fL (ref 78.0–100.0)
PLATELETS: 326 10*3/uL (ref 150–400)
RBC: 2.91 MIL/uL — AB (ref 4.22–5.81)
RDW: 17.2 % — ABNORMAL HIGH (ref 11.5–15.5)
WBC: 7.7 10*3/uL (ref 4.0–10.5)

## 2017-09-25 LAB — MAGNESIUM: MAGNESIUM: 1.8 mg/dL (ref 1.7–2.4)

## 2017-09-25 MED ORDER — FERROUS GLUCONATE 324 (38 FE) MG PO TABS: 324.0000 mg | ORAL_TABLET | Freq: Two times a day (BID) | ORAL | 0 refills | Status: DC

## 2017-09-25 MED ORDER — OXYCODONE HCL 5 MG PO TABS: 5.0000 mg | ORAL_TABLET | Freq: Four times a day (QID) | ORAL | 0 refills | Status: DC | PRN

## 2017-09-25 MED ORDER — FOLIC ACID 1 MG PO TABS: 1.0000 mg | ORAL_TABLET | Freq: Every day | ORAL | 0 refills | Status: AC

## 2017-09-25 MED ORDER — IOHEXOL 300 MG/ML  SOLN
100.0000 mL | Freq: Once | INTRAMUSCULAR | Status: AC | PRN
  Administered 2017-09-25: 75 mL via INTRAVENOUS

## 2017-09-25 MED ORDER — POLYETHYLENE GLYCOL 3350 17 G PO PACK: 17.0000 g | PACK | Freq: Every day | ORAL | 0 refills | Status: DC

## 2017-09-25 MED ORDER — ADULT MULTIVITAMIN W/MINERALS CH: 1.0000 | ORAL_TABLET | Freq: Every day | ORAL | 0 refills | Status: AC

## 2017-09-25 MED ORDER — ENSURE ENLIVE PO LIQD: 237.0000 mL | Freq: Two times a day (BID) | ORAL | 12 refills | Status: DC

## 2017-09-25 NOTE — Telephone Encounter (Signed)
Appt has been scheduled for the pt to see Dr. Benay Spice on 5/14 at 2pm. Pt is currently in the hospital. I will mail the pt a letter.

## 2017-09-25 NOTE — Progress Notes (Signed)
Went over [employer] instructions with patient, all questions answered. Pt dc to home with daughter.

## 2017-09-25 NOTE — Discharge Summary (Signed)
Physician Discharge Summary  Alex [name] BPZ:[id number] DOB: 06-Apr-1951 DOA: 09/13/2017  PCP: Antonietta Jewel, MD  Admit date: 09/13/2017 Discharge date: 09/25/2017  Time spent: 35 minutes  Recommendations for Outpatient Follow-up:  1. Follow up outpatient CBC/CMP (attention to Hb -started on iron and folate).  Follow MMA pending here with low normal B12. 2. Follow up with oncology.  CEA pending.  CT chest obtained prior to d/c.  3. Follow up with surgery 4. Also needs follow up with GI.  Will need full colonoscopy as an outpatient as outpatient to remove rectosigmoid and rectal polyps and screen unexamined colon (planning for colonoscopy through colostomy and rectum) 5. Follow up with CT surgery as outpatient 6. Follow up hepatitis C viral load  Discharge Diagnoses:  Principal Problem:   Nausea & vomiting Active Problems:   History of stroke   Empyema (HCC)   Benign essential HTN   Abdominal distension   Dehydration   Hypokalemia   HLD (hyperlipidemia)   Nausea and vomiting   Malignant tumor of sigmoid colon (HCC)   Colon obstruction [facility])   Discharge Condition: stable  Diet recommendation: soft  Filed Weights   09/14/17 0400 09/14/17 1504  Weight: 78.5 kg (173 lb 1 oz) 75.9 kg (167 lb 5.3 oz)    History of present illness:  67 year old Caucasian male with past medical history significant for hypertension, hyperlipidemia, stroke, carotid artery stenosis, who was recently in the hospital for pneumonia and empyema and underwent surgical management. He presented with nausea, vomiting, abdominal distention as well as hypokalemia.Abdominal imaging is indicated possible ileus, although with this worsening abdominal distention, vomiting and lack of bowel movement/flatus, there is concern that he is progressing to a bowel obstruction. NG tube placed. General surgery consulted.    He had a flex sig which was notable for likely malignant obstructing tumor in the sigmoid colon.   He's now s/p ex lap and partial colectomy with end colostomy on 4/25 with general surgery.  The surgical pathology shows invasive adenocarcinoma.  He was referred to oncology.  He's had post operative anemia which has been stable at discharge.    Hospital Course:  Nausea and vomiting secondary to large bowel obstruction -Patient presented with nausea and vomiting, had some relief after NGT insertion. CT abdomen pelvis on admission showing slight wall thickening along sigmoid colon. Rectal contrast 4/22 showing high grade stenosis/obstruction of the distal sigmoid, with no contrast passing proximally. -GI input greatly appreciated, flex sig with likely malignant obstructing tumor in sigmoid colon (bx from 4/23 with tubular adenoma with focal high grade glandular dysplasia) - Appreciate surgery c/s.   - s/p ex lap, partial colectomy with end colostomy on 4/25 with gen surgery [ ]  surgical pathology (invasive adenocarcinoma) - Pt has been set up for oncology follow up  - He's now doing well, tolerating PO  - Will need full colonoscopy as an outpatient to remove rectosigmoid and rectal polyps and screen unexamined colon (planning for colonoscopy through colostomy and via rectum)  Anemia  Acute Blood Loss Anemia: holding pharm dvt ppx for now.  No active bleeding.  Brown stool in ostomy.  Hb 7 today.  Follow tomorrow.  Stable.  Iron panel notable for iron def anemia and low folate.  Low normal B12, follow MMA.  Folate, ferrous gluconate Follow MMA   Bright Red Blood Per Rectum:  Occurred prior to surgery and after sigmoidoscopy.  Pt hemodynamically stable.  Now resolved.  Likely related to biopsy of lesion from during sigmoidoscopy.  H/H downtrending postop now, but no reported bleeding.  suspect due to bx and now pt is post op.  Ctm.   Recent hx ofEmpyema [facility]): -s/p decortication. No chest pain, cough, shortness rest, fever. Patient was started on linezolid during his last admission and  antibiotics were continued until 4/23 [ ]  repeat CXR per CT surg.   History of stroke: -Resume aspirin and Zocor when able to take oral  HLD: -Zocorwhen able to take oral  Hypokalemia  Hyperkalemia: stable  HTN: -blood pressure stable. Resume HCTZ . IV hydralazine prn.  BP stable.  Tachycardia: improved. EKG NSR.  Elevated LFTs  Positive Hepatitis C Ab: Follow PCR.  continue to follow.  Hepatitis panel with positive Hep C Ab. CT abdomen with unremarkable liver previously in hospitalization    Procedures: 4/23 Flex Sig - Likely malignant completely obstructing tumor in the distal sigmoid colon. Biopsied. Tattooed. - Two 5 to 12 mm polyps in the rectum and at the recto-sigmoid colon. Resection not Attempted. - Internal hemorrhoids. - Return patient to hospital ward for ongoing care. - Await pathology results. - NPO given colonic obstruction. - Surgical management of obstructing sigmoid cancer. - Full colonoscopy is recommended as an outpatient after recovery from colon resection to remove recto-sigmoid and rectal polyps and screening the unexamined Colon.  4/25 ex lap and partial colectomy with end colostomy with surgery  Consultations:  GI  Surgery  CT surgery  Discharge Exam: Vitals:   09/25/17 0529 09/25/17 1439  BP: 114/82 118/75  Pulse: 76 83  Resp: 18   Temp: 97.7 F (36.5 C) 97.9 F (36.6 C)  SpO2: 98% 98%   Doing well.  Wants to go home.    General: No acute distress. Cardiovascular: Heart sounds show a regular rate, and rhythm. No gallops or rubs. No murmurs. No JVD. Lungs: Clear to auscultation bilaterally with good air movement. No rales, rhonchi or wheezes. Abdomen: Soft, nontender, nondistended with normal active bowel sounds. No masses. No hepatosplenomegaly.  Colostomy on L.  Midline incision Neurological: Alert and oriented 3. Moves all extremities 4. Cranial nerves II through XII grossly intact. Skin: Warm and dry. No  rashes or lesions. Extremities: No clubbing or cyanosis. No edema.  Psychiatric: Mood and affect are normal. Insight and judgment are appropriate.  Discharge Instructions   Discharge Instructions    Call MD for:  difficulty breathing, headache or visual disturbances   Complete by:  As directed    Call MD for:  extreme fatigue   Complete by:  As directed    Call MD for:  persistant dizziness or light-headedness   Complete by:  As directed    Call MD for:  persistant nausea and vomiting   Complete by:  As directed    Call MD for:  redness, tenderness, or signs of infection (pain, swelling, redness, odor or green/yellow discharge around incision site)   Complete by:  As directed    Call MD for:  severe uncontrolled pain   Complete by:  As directed    Call MD for:  temperature >100.4   Complete by:  As directed    DIET SOFT   Complete by:  As directed    Discharge instructions   Complete by:  As directed    You were seen for nausea and vomiting.  You had a colonic mass that was removed.  The biopsy results show that this was cancer (adenocarcinoma).  We've arranged oncology (cancer doctors) follow up for you as an outpatient.  Your blood counts were low.  We've started you on iron and folate.  Please take these daily and follow up with your PCP as an outpatient to follow up labs.  Hold your aspirin and your plavix until you follow up with your PCP.    Please follow up with surgery as an outpatient.  Follow up with your PCP in a few days to follow up labs and to follow up the hospitalization.  You had a positive hepatitis C antibody here.  Please follow up with your PCP to follow up the results of the hepatitis C pcr (the antibody does not necessarily mean you have the infection, but means you have been exposed in the past.  The PCR will determine if you have a true infection).  Return if you have new, recurrent, or worsening symptoms.  Please ask your PCP to request records from this  hospitalization so they know what was done and what the next steps will be.   Increase activity slowly   Complete by:  As directed      Allergies as of 09/25/2017   No Known Allergies     Medication List    STOP taking these medications   aspirin 81 MG EC tablet   clopidogrel 75 MG tablet Commonly known as:  PLAVIX   linezolid 600 MG tablet Commonly known as:  ZYVOX     TAKE these medications   acetaminophen 500 MG tablet Commonly known as:  TYLENOL Take 2 tablets (1,000 mg total) by mouth every 6 (six) hours as needed.   feeding supplement (ENSURE ENLIVE) Liqd Take 237 mLs by mouth 2 (two) times daily between meals.   ferrous gluconate 324 MG tablet Commonly known as:  FERGON Take 1 tablet (324 mg total) by mouth 2 (two) times daily with a meal.   folic acid 1 MG tablet Commonly known as:  FOLVITE Take 1 tablet (1 mg total) by mouth daily. Start taking on:  09/26/2017   hydrochlorothiazide 25 MG tablet Commonly known as:  HYDRODIURIL Take 25 mg by mouth daily.   multivitamin with minerals Tabs tablet Take 1 tablet by mouth daily. Start taking on:  09/26/2017   oxyCODONE 5 MG immediate release tablet Commonly known as:  Oxy IR/ROXICODONE Take 1 tablet (5 mg total) by mouth every 6 (six) hours as needed for up to 12 doses for severe pain (pain). What changed:    when to take this  reasons to take this   polyethylene glycol packet Commonly known as:  MIRALAX / GLYCOLAX Take 17 g by mouth daily. Start taking on:  09/26/2017   simvastatin 10 MG tablet Commonly known as:  ZOCOR Take 20 mg by mouth at bedtime.      No Known Allergies Follow-up [facility], [place]. Go on 10/04/2017.   Specialty:  General Surgery Why:  Your appointment for staple removal is at 10:00 AM. Please arrive 30 min prior to appointment time.  Contact information: [address] [place] [phone]       Clovis Riley, MD. Go on 10/19/2017.   Specialty:  General Surgery Why:  Your follow up appointment with Dr. Kae Heller is scheduled for 10:50 AM Contact information: [address] [place] [address] [phone]        Antonietta Jewel, MD Follow up.   Specialty:  Internal Medicine Contact information: [address] [place] [address] [phone]  Ladell Pier, MD Follow up.   Specialty:  Oncology Why:  Call to confirm your appointment Contact information: [place] [address] [phone]            The results of significant diagnostics from this hospitalization (including imaging, microbiology, ancillary and laboratory) are listed below for reference.    Significant Diagnostic Studies: Dg Chest 2 View  Result Date: 09/25/2017 CLINICAL DATA:  Shortness of breath EXAM: CHEST - 2 VIEW COMPARISON:  September 04, 2017 chest radiograph and chest CT August 26, 2017 FINDINGS: There is persistent pleural effusion in the right base region with patchy atelectasis in the right base. Lungs elsewhere are clear. The heart size and pulmonary vascularity are normal. No adenopathy. There is an apparent fracture of the right lateral sixth rib. No pneumothorax. IMPRESSION: Right base pleural effusion and atelectasis, essentially stable. Fracture lateral right sixth rib. No pneumothorax. No new opacity. Stable cardiac silhouette. Electronically Signed   By: Lowella Grip III M.D.   On: 09/25/2017 09:40   Dg Chest 2 View  Result Date: 09/04/2017 CLINICAL DATA:  Chest tube removal. EXAM: CHEST - 2 VIEW COMPARISON:  09/03/2017. FINDINGS: Mediastinum hilar structures normal. Heart size normal. Right base atelectasis/infiltrate right-sided pleural effusion again noted without interim change. No acute bony abnormality. IMPRESSION: Persistent right base infiltrate/atelectasis and right-sided pleural effusion. No interim change. Electronically  Signed   By: Marcello Moores  Register   On: 09/04/2017 07:40   Dg Chest 2 View  Result Date: 08/30/2017 CLINICAL DATA:  Empyema EXAM: CHEST - 2 VIEW COMPARISON:  08/25/2017 and 08/26/2017 FINDINGS: Right lower lobe density unchanged compatible with empyema and adjacent infiltrate. No pneumothorax Left lung clear.  Heart size and vascularity normal. IMPRESSION: Large loculated pleural effusion on the right with right lower lobe airspace disease unchanged. Findings consistent with empyema. Electronically Signed   By: Franchot Gallo M.D.   On: 08/30/2017 09:04   Dg Abd 1 View  Result Date: 09/15/2017 CLINICAL DATA:  Ileus; pt reports abdominal distension and no BM since 08/30/17 EXAM: ABDOMEN - 1 VIEW COMPARISON:  09/13/2017 FINDINGS: Multiple gas distended nondilated small bowel loops in the right mid abdomen . There is gaseous distention of the proximal colon through the transverse segment, decompressed distally. Bilateral pelvic phleboliths. Regional bones unremarkable scratch the mild degenerative changes in bilateral hips. IMPRESSION: Mild gaseous distention of small bowel and proximal colon, favor adynamic ileus, slightly worse since prior exam. Electronically Signed   By: Lucrezia Europe M.D.   On: 09/15/2017 09:40   Dg Abdomen 1 View  Result Date: 09/13/2017 CLINICAL DATA:  Constipation, anorexia, abdominal distension and vomiting. EXAM: ABDOMEN - 1 VIEW COMPARISON:  None. FINDINGS: Gas-filled large bowel measuring to 6.7 cm. Gas-filled small bowel measuring to 3.2 cm. No though significant retained large bowel stool by routine radiography. No intra-abdominal mass effect or pathologic calcifications. Probable phleboliths project in RIGHT pelvis. Soft tissue planes and included osseous structures are nonsuspicious. IMPRESSION: Mild gas distended small and large bowel most compatible with mild ileus. Electronically Signed   By: Elon Alas M.D.   On: 09/13/2017 22:40   Ct Chest W Contrast  Result Date:  09/25/2017 CLINICAL DATA:  History of colon cancer. Possible metastatic disease. EXAM: CT CHEST WITH CONTRAST TECHNIQUE: Multidetector CT imaging of the chest was performed during intravenous contrast administration. CONTRAST:  106mL OMNIPAQUE IOHEXOL 300 MG/ML  SOLN COMPARISON:  CT scan of August 26, 2017. FINDINGS: Cardiovascular: Atherosclerosis of thoracic aorta is  noted without aneurysm or dissection. Mild coronary calcifications are noted. Normal cardiac size. No pericardial effusion. Mediastinum/Nodes: No enlarged mediastinal, hilar, or axillary lymph nodes. Thyroid gland, trachea, and esophagus demonstrate no significant findings. Lungs/Pleura: No pneumothorax is noted. Left lung is clear. Right pleural effusion noted on prior exam is significantly smaller currently. There is adjacent subsegmental atelectasis in the right lower lobe. No definite evidence of pulmonary nodule or mass is noted. Upper Abdomen: Mild amount of fluid is noted around the liver and spleen. Musculoskeletal: Minimally displaced fractures are seen involving the right sixth and seventh ribs posteriorly which may be acute or subacute. No other significant osseous abnormality is noted. IMPRESSION: Right pleural effusion noted on prior exam is significantly smaller currently, with adjacent subsegmental atelectasis. No definite evidence of pulmonary nodule or mass is noted. Minimally displaced fractures are seen involving the right sixth and seventh ribs which may be acute or subacute. Mild amount of fluid is seen around the liver and spleen in the visualized portion of the upper abdomen. Coronary artery calcifications are noted suggesting coronary artery disease. Aortic Atherosclerosis (ICD10-I70.0). Electronically Signed   By: Marijo Conception, M.D.   On: 09/25/2017 14:35   Ct Abdomen Pelvis W Contrast  Result Date: 09/14/2017 CLINICAL DATA:  No bowel movements for past 2 weeks. Generalized abdominal distention and vomiting. EXAM: CT  ABDOMEN AND PELVIS WITH CONTRAST TECHNIQUE: Multidetector CT imaging of the abdomen and pelvis was performed using the standard protocol following bolus administration of intravenous contrast. CONTRAST:  100 mL ISOVUE-300 IOPAMIDOL (ISOVUE-300) INJECTION 61% COMPARISON:  Abdominal radiograph performed 09/13/2016 FINDINGS: Lower chest: There appears to be a small empyema at the right lung base, with diffusely thickened rind. Underlying atelectasis is noted. The visualized portions of the mediastinum are unremarkable. Hepatobiliary: The liver is unremarkable in appearance. The gallbladder is unremarkable in appearance. The common bile duct remains normal in caliber. Pancreas: The pancreas is within normal limits. Spleen: The spleen is unremarkable in appearance. Adrenals/Urinary Tract: The adrenal glands are unremarkable in appearance. Mild nonspecific perinephric stranding is noted bilaterally. There is no evidence of hydronephrosis. No renal or ureteral stones are identified. Stomach/Bowel: The stomach is unremarkable in appearance. The small bowel is within normal limits. The appendix is normal in caliber, without evidence of appendicitis. There is slight wall thickening along the sigmoid colon, with gradual decompression. This may reflect an acute infectious or inflammatory process, with resultant dysmotility. The remainder of the colon is diffusely filled with fluid and air. This would typically correspond to diarrhea; no stool is seen. Vascular/Lymphatic: Scattered calcification is seen along the abdominal aorta and its branches. The abdominal aorta is otherwise grossly unremarkable. The inferior vena cava is grossly unremarkable. No retroperitoneal lymphadenopathy is seen. No pelvic sidewall lymphadenopathy is identified. Reproductive: The bladder is mildly distended and grossly unremarkable. The prostate is borderline normal in size. Other: No additional soft tissue abnormalities are seen. Musculoskeletal: No  acute osseous abnormalities are identified. The visualized musculature is unremarkable in appearance. IMPRESSION: 1. Slight wall thickening along the sigmoid colon, with gradual decompression. This may reflect an acute infectious or inflammatory process, with resultant colonic dysmotility. The remainder of the colon is diffusely filled with fluid and air. This would typically correspond to diarrhea; no stool is seen to suggest constipation. 2. Apparent small empyema at the right lung base, with diffusely thickened rind. Would correlate for any lung symptoms. Underlying atelectasis noted. Aortic Atherosclerosis (ICD10-I70.0). Electronically Signed   By: Francoise Schaumann.D.  On: 09/14/2017 02:15   Dg Chest Port 1 View  Result Date: 09/03/2017 CLINICAL DATA:  Pleural effusion.  Status post VATS. EXAM: PORTABLE CHEST 1 VIEW COMPARISON:  09/02/2017. FINDINGS: A single RIGHT chest tube remains in place at the RIGHT base hemithorax. RIGHT pleural effusion appears increased. There is no pneumothorax. Basilar atelectasis is increased as well. LEFT lung clear. Cardiomegaly. IMPRESSION: Increasing RIGHT base atelectasis and effusion. RIGHT chest tube good position. No pneumothorax. Electronically Signed   By: Staci Righter M.D.   On: 09/03/2017 07:36   Dg Chest Port 1 View  Result Date: 09/02/2017 CLINICAL DATA:  Follow-up loculated RIGHT pleural effusion/empyema. Postop day 3 decortication. Removal of 1 of the RIGHT chest tubes. EXAM: PORTABLE CHEST 1 VIEW COMPARISON:  09/01/2017, 08/31/2017 and earlier, including CT chest 08/26/2017. FINDINGS: Since yesterday, the more superiorly positioned of the RIGHT chest tubes have been removed. The LOWER RIGHT chest tube remains in place with its side hole potentially in the chest wall, having withdrawn slightly. No evidence of pneumothorax. Residual small RIGHT pleural effusion, unchanged. Improved aeration in the RIGHT lung base, with passive atelectasis and/or pneumonia  persisting. LEFT lung remains clear. Cardiac silhouette mildly enlarged, unchanged, with normal pulmonary vascularity. IMPRESSION: 1. No pneumothorax after removal of 1 of the 2 RIGHT chest tubes. The remaining RIGHT chest tube has withdrawn slightly since yesterday and its side hole may be in the chest wall. 2. Stable small RIGHT pleural effusion. 3. Improved aeration in the RIGHT lower lobe, with passive atelectasis and/or pneumonia persisting. 4. No new abnormalities. Electronically Signed   By: Evangeline Dakin M.D.   On: 09/02/2017 09:40   Dg Chest Port 1 View  Result Date: 09/01/2017 CLINICAL DATA:  Chest tube EXAM: PORTABLE CHEST 1 VIEW COMPARISON:  08/31/2017 FINDINGS: Two chest tubes on the right are unchanged. Small right pleural effusion and right lower lobe airspace disease unchanged. No pneumothorax. Left lung remains clear IMPRESSION: Two chest tubes remain on the right. Right lower lobe airspace disease and right pleural effusion unchanged. No pneumothorax. Electronically Signed   By: Franchot Gallo M.D.   On: 09/01/2017 07:59   Dg Chest Port 1 View  Result Date: 08/31/2017 CLINICAL DATA:  Post VATS for empyema EXAM: PORTABLE CHEST 1 VIEW COMPARISON:  08/30/2017; 08/04/2017; chest CT-07/27/2017 FINDINGS: Grossly unchanged cardiac silhouette and mediastinal contours. Stable position of support apparatus. Suspected recurrence of small right-sided effusion with associated worsening right basilar heterogeneous/consolidative opacities. Left basilar heterogeneous opacities are unchanged. No pneumothorax. No evidence of edema. No acute osseus abnormalities. IMPRESSION: 1.  Stable positioning of support apparatus.  No pneumothorax. 2. Interval increase in small right-sided effusion with associated worsening right basilar heterogeneous/consolidative opacities. Electronically Signed   By: Sandi Mariscal M.D.   On: 08/31/2017 08:23   Dg Chest Port 1 View  Result Date: 08/30/2017 CLINICAL DATA:  Post  thoracotomy EXAM: PORTABLE CHEST 1 VIEW COMPARISON:  Portable exam 1630 hours compared to 08/30/2017 FINDINGS: Pair of thoracostomy tubes at inferior RIGHT hemithorax. Enlargement of cardiac silhouette. Mediastinal contours and pulmonary vascularity normal. RIGHT basilar atelectasis and question minimal pleural effusion. No pneumothorax. LEFT lung clear. Bones unremarkable. IMPRESSION: RIGHT basilar atelectasis and thoracostomy tubes with question minimal pleural effusion. Enlargement of cardiac silhouette. Electronically Signed   By: Lavonia Dana M.D.   On: 08/30/2017 17:14   Dg Abd Portable 1v  Result Date: 09/16/2017 CLINICAL DATA:  Nasogastric tube placement. EXAM: PORTABLE ABDOMEN - 1 VIEW COMPARISON:  Yesterday. FINDINGS: Mildly prominent gas-filled  loops of colon and small bowel with an interval decrease in caliber. Interval nasogastric tube with its tip in the proximal stomach and side hole at or just below the gastroesophageal junction. Mild lumbar and lower thoracic spine degenerative changes. IMPRESSION: 1. Nasogastric tube tip in the proximal stomach and side hole at or just below the gastroesophageal junction. 2. Improving pattern of colonic and small bowel ileus. Electronically Signed   By: Claudie Revering M.D.   On: 09/16/2017 12:38   Dg Colon W/water Sol Cm  Result Date: 09/17/2017 CLINICAL DATA:  Abdominal pain. Lack of stool since surgery on August 30 2017 EXAM: COLON WITH WATER SOLUTION CONTRAST COMPARISON:  Multiple exams, including radiograph from 09/16/2017 and CT abdomen from 09/14/2017 FINDINGS: Initial KUB demonstrates borderline distention of proximal colon and mildly dilated loops of small bowel. Proximally 400 cc of Isovue mixed 3-1 with water was utilized. The rectum appears unremarkable but there is blunt abrupt truncation of the contrast column in the vicinity of the distal sigmoid colon proximally in the region of the transition shown on prior CT. The transition appears to be abrupt  and causes a blunted margin of the contrast column. Using low gravity pressure and turning the patient side to side did not allow [place] to extend through this region of narrowing. IMPRESSION: 1. Focal high-grade stenosis/obstruction in the distal sigmoid colon corresponding to the site of transition shown on prior CT. This could be from a focal stricture or scarring but is not entirely specific. Tumor not readily excluded. The proximal margin of the contrast column appears blunted in this vicinity. We were not able to cause contrast to flow proximal to this obstruction. Electronically Signed   By: Van Clines M.D.   On: 09/17/2017 13:43    Microbiology: Recent Results (from the past 240 hour(s))  Surgical pcr screen     Status: Abnormal   Collection Time: 09/19/17  9:07 PM  Result Value Ref Range Status   MRSA, PCR POSITIVE (A) NEGATIVE Final    Comment: RESULT CALLED TO, READ BACK BY AND VERIFIED WITH: Isidoro Donning RN 09/19/17 32992 JDW    Staphylococcus aureus POSITIVE (A) NEGATIVE Final    Comment: (NOTE) The Xpert SA Assay (FDA approved for NASAL specimens in patients 7 years of age and older), is one component of a comprehensive surveillance program. It is not intended to diagnose infection nor to guide or monitor treatment.      Labs: Basic Metabolic Panel: Recent Labs  Lab 09/21/17 0459 09/21/17 1845 09/22/17 0448 09/23/17 0706 09/24/17 0704 09/25/17 0346  NA 133*  --  132* 134* 136 137  K 5.8* 4.5 3.9 3.6 3.9 4.0  CL 93*  --  93* 99* 102 103  CO2 25  --  30 27 26 28   GLUCOSE 101*  --  89 91 103* 104*  BUN 16  --  10 6 5* 5*  CREATININE 0.88  --  0.62 0.60* 0.62 0.69  CALCIUM 8.4*  --  7.9* 7.8* 8.2* 8.4*  MG 1.9  --  2.1 2.0  --  1.8   Liver Function Tests: Recent Labs  Lab 09/20/17 0647 09/21/17 0459 09/22/17 0448 09/24/17 0704 09/25/17 0346  AST 73* 64* 31 20 24   ALT 96* 93* 56 33 30  PENDING  ALKPHOS 62 52 41 43 46  BILITOT 1.3* 1.5* 0.7 0.7 0.7  PROT  6.1* 5.6* 4.8* 4.9* 5.3*  ALBUMIN 3.5 3.0* 2.4* 2.5* 2.7*   No results for input(s):  LIPASE, AMYLASE in the last 168 hours. No results for input(s): AMMONIA in the last 168 hours. CBC: Recent Labs  Lab 09/21/17 0459 09/22/17 0448 09/22/17 1348 09/23/17 0706 09/24/17 0704 09/25/17 0346  WBC 8.9 4.7  --  5.3 6.9 7.7  HGB 10.8* 7.2* 7.7* 7.0* 7.3* 7.6*  HCT 33.8* 22.8* 24.7* 21.7* 23.8* 24.5*  MCV 82.2 82.6  --  83.8 85.3 84.2  PLT 289 203  --  235 281 326   Cardiac Enzymes: No results for input(s): CKTOTAL, CKMB, CKMBINDEX, TROPONINI in the last 168 hours. BNP: BNP (last 3 results) No results for input(s): BNP in the last 8760 hours.  ProBNP (last 3 results) No results for input(s): PROBNP in the last 8760 hours.  CBG: No results for input(s): GLUCAP in the last 168 hours.     Signed:  Fayrene Helper MD.  [facility] 09/25/2017, 4:55 PM

## 2017-09-25 NOTE — Care Management Note (Signed)
Case Management Note  Patient Details  Name: Alex [name] MRN: [id number] Date of Birth: February 16, 1951  Subjective/Objective:                    Action/Plan: [place] letter given and explained . Patient voiced understanding.  Expected Discharge Date:  09/25/17               Expected Discharge Plan:  [facility]  In-House Referral:  Financial Counselor  Discharge planning Services  CM Consult, Medication Assistance, [facility]  Post Acute Care Choice:  Home Health Choice offered to:  Patient  DME Arranged:  N/A DME Agency:  NA  HH Arranged:  RN [place] Agency:  [facility]  Status of Service:  Completed, signed off  If discussed at [place] of Stay Meetings, dates discussed:    Additional Comments:  Marilu Favre, RN 09/25/2017, 4:47 PM

## 2017-09-25 NOTE — Progress Notes (Addendum)
[facility]/Trauma Progress Note  5 Days Post-Op   Assessment/Plan Large bowel obstruction - s/p VATS/decortication 08/30/17 - CT scan 4/19 Colon is filled with liquid/air to the level of the sigmoid. Mild sigmoid wall thickening. - rectal contrast study 4/22 showed high grade stenosis/obstruction of distal sigmoid. - S/P flex sig, Dr. 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 Hilarie Fredrickson expected to be malignant -POD#5 -S/Pexploratory laparotomy, and partial colectomy (rectosigmoid) with end colostomy, Dr. 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 EXTENDS [place]. LYMPH NODES (0/13). RESECTION MARGINS ARE NEGATIVE FOR CARCINOMA. - pT3, pN0 - CEA and chest CT pending - discussed pathology with pt - will need GI oncology consult.   Acute blood loss anemia. Significant variance without alteration in vital signs. No external signs of bleeding.Hemoglobin 7.6 this morning up from 7.3. Doubt ongoing blood loss.Anemia is being tolerated well. -continue to holdLovenox - ferrous gluconate   FEN: IVF,soft diet VTE: SCD's(HOLD LOVENOX) ID: Linezolid per CT surgery, Cefotetan Pre-op Foley: none Follow up: TBD  DISPO:Hg stable. Pt is clear for discharge from a surgical standpoint. GI oncology consult. I spoke with Dr. Alen Blew and he stated he would send the pt's information to the GI navigator to get the pt set up with an oupt appointment.     LOS: 11 days    Subjective: CC: anemia  Pt states no pain at this time. He is tolerating a diet. No nausea or vomiting, fever or chills. No issues overnight. No family at bedside.   Objective: Vital signs in last 24 hours: Temp:  [97.7 F (36.5 C)-98.1 F (36.7 C)] 97.7 F (36.5 C) (04/30 0529) Pulse Rate:   [75-81] 76 (04/30 0529) Resp:  [18] 18 (04/30 0529) BP: (114-130)/(77-92) 114/82 (04/30 0529) SpO2:  [98 %-100 %] 98 % (04/30 0529) Last BM Date: 09/24/17  Intake/Output from previous day: 04/29 0701 - 04/30 0700 In: 1510 [P.O.:960; I.V.:550] Out: 1250 [Urine:1150; Stool:100] Intake/Output this shift: Total I/O In: 236 [P.O.:236] Out: -   PE: Gen: Alert, NAD, pleasant, cooperative Pulm:Rate andeffort normal Abd: Soft,no distention,midline incision with staples intact is without drainage or surrounding erythema, brown liquid stool in colostomy bag, ostomy pink, no TTP  Skin: no rashes noted, warm and dry  Anti-infectives: Anti-infectives (From admission, onward)   Start     Dose/Rate Route Frequency Ordered Stop   09/20/17 1702  cefoTEtan (CEFOTAN) 2 g in sodium chloride 0.9 % 100 mL IVPB  Status:  Discontinued     2 g 200 mL/hr over 30 Minutes Intravenous 30 min pre-op 09/20/17 1702 09/20/17 1704   09/20/17 0730  cefoTEtan (CEFOTAN) 2 g in sodium chloride 0.9 % 100 mL IVPB     2 g 200 mL/hr over 30 Minutes Intravenous 30 min pre-op 09/19/17 1156 09/20/17 0936   09/19/17 1145  cefoTEtan (CEFOTAN) 2 g in sodium chloride 0.9 % 100 mL IVPB  Status:  Discontinued     2 g 200 mL/hr over 30 Minutes Intravenous On call to O.R. 09/19/17 1144 09/19/17 1156   09/17/17 1245  linezolid (ZYVOX) IVPB 600 mg     600 mg 300 mL/hr over 60 Minutes Intravenous Every 12 hours 09/17/17 1243 09/19/17 0014   09/14/17 1000  linezolid (ZYVOX) tablet 600 mg  Status:  Discontinued     600 mg Oral 2 times daily 09/14/17 0401 09/17/17 1243  Lab Results:  Recent Labs    09/24/17 0704 09/25/17 0346  WBC 6.9 7.7  HGB 7.3* 7.6*  HCT 23.8* 24.5*  PLT 281 326   BMET Recent Labs    09/24/17 0704 09/25/17 0346  NA 136 137  K 3.9 4.0  CL 102 103  CO2 26 28  GLUCOSE 103* 104*  BUN 5* 5*  CREATININE 0.62 0.69  CALCIUM 8.2* 8.4*   PT/INR No results for input(s): LABPROT, INR in the  last 72 hours. CMP     Component Value Date/Time   NA 137 09/25/2017 0346   K 4.0 09/25/2017 0346   CL 103 09/25/2017 0346   CO2 28 09/25/2017 0346   GLUCOSE 104 (H) 09/25/2017 0346   BUN 5 (L) 09/25/2017 0346   CREATININE 0.69 09/25/2017 0346   CALCIUM 8.4 (L) 09/25/2017 0346   PROT 5.3 (L) 09/25/2017 0346   ALBUMIN 2.7 (L) 09/25/2017 0346   AST 24 09/25/2017 0346   ALT 30 09/25/2017 0346   ALT PENDING 09/25/2017 0346   ALKPHOS 46 09/25/2017 0346   BILITOT 0.7 09/25/2017 0346   GFRNONAA >60 09/25/2017 0346   GFRAA >60 09/25/2017 0346   Lipase     Component Value Date/Time   LIPASE 37 09/13/2017 2127    Studies/Results: Dg Chest 2 View  Result Date: 09/25/2017 CLINICAL DATA:  Shortness of breath EXAM: CHEST - 2 VIEW COMPARISON:  September 04, 2017 chest radiograph and chest CT August 26, 2017 FINDINGS: There is persistent pleural effusion in the right base region with patchy atelectasis in the right base. Lungs elsewhere are clear. The heart size and pulmonary vascularity are normal. No adenopathy. There is an apparent fracture of the right lateral sixth rib. No pneumothorax. IMPRESSION: Right base pleural effusion and atelectasis, essentially stable. Fracture lateral right sixth rib. No pneumothorax. No new opacity. Stable cardiac silhouette. Electronically Signed   By: Lowella Grip III M.D.   On: 09/25/2017 09:40      Kalman Drape , [facility] 09/25/2017, 10:20 AM  Pager: [phone] Mon-Wed, Friday 7:00am-4:30pm Thurs 7am-11:30am  Consults: [phone]

## 2017-09-26 ENCOUNTER — Encounter: Payer: Self-pay | Admitting: Cardiothoracic Surgery

## 2017-09-26 LAB — CEA: CEA1: 1 ng/mL (ref 0.0–4.7)

## 2017-09-26 LAB — METHYLMALONIC ACID, SERUM: Methylmalonic Acid, Quantitative: 141 nmol/L (ref 0–378)

## 2017-09-27 ENCOUNTER — Encounter (HOSPITAL_COMMUNITY): Payer: Self-pay

## 2017-09-27 ENCOUNTER — Telehealth: Payer: Self-pay | Admitting: *Deleted

## 2017-09-27 ENCOUNTER — Ambulatory Visit: Payer: Self-pay | Admitting: Family

## 2017-09-27 LAB — FUNGUS CULTURE RESULT

## 2017-09-27 LAB — FUNGUS CULTURE WITH STAIN

## 2017-09-27 LAB — FUNGAL ORGANISM REFLEX

## 2017-09-27 NOTE — Telephone Encounter (Signed)
-----   Message from Jerene Bears, MD sent at 09/26/2017 11:19 AM EDT ----- Regarding: FW: colonoscopy Pt will need completion colon, lets bring him to clinic in July to discuss and see what treatment he is receiving Thanks JMP  ----- Message ----- From: Elodia Florence., MD Sent: 09/25/2017  10:11 PM To: Jerene Bears, MD Subject: colonoscopy                                    Just wanted to let you know this patient was discharged.  Sounds like you were planning on colonoscopy as outpatient.  Thanks, [employer]

## 2017-09-27 NOTE — Telephone Encounter (Signed)
Left message for patient to call back. I have scheduled him for an office visit on 12/17/17 at 2:30 pm to discuss colonoscopy.

## 2017-09-28 NOTE — Care Management (Signed)
Received a message to call patient's daughter Yvetta Coder [phone] regarding " 3 month supply of free bags" .   Reviewed notes [facility] nurse Enrolled patient in [place] Start Discharge program: Yes  Spoke with Neoma Laming with [facility] . Per Neoma Laming home health RN will follow up with patient and daughter regarding ostomy supplies.    Called [place] back at [phone] left voicemail with above information.  Magdalen Spatz RN BSN [phone]

## 2017-09-28 NOTE — Telephone Encounter (Signed)
Left voicemail for patient to call back. 

## 2017-10-01 NOTE — Telephone Encounter (Signed)
Left voicemail for patient to call back. 

## 2017-10-03 NOTE — Telephone Encounter (Signed)
Unfortunately, I have been unsuccessful in reaching Alex [name] by phone. I will send him a letter with appointment information as I suspect he is going to multiple appointment related to his recently diagnosed colon cancer and has been unable to reach back out to [place].

## 2017-10-09 ENCOUNTER — Inpatient Hospital Stay: Payer: Self-pay | Attending: Oncology | Admitting: Oncology

## 2017-10-09 VITALS — BP 142/92 | HR 77 | Temp 97.8°F | Resp 19 | Ht 66.0 in | Wt 167.8 lb

## 2017-10-09 DIAGNOSIS — C3491 Malignant neoplasm of unspecified part of right bronchus or lung: Secondary | ICD-10-CM

## 2017-10-09 DIAGNOSIS — Z8673 Personal history of transient ischemic attack (TIA), and cerebral infarction without residual deficits: Secondary | ICD-10-CM

## 2017-10-09 DIAGNOSIS — Z933 Colostomy status: Secondary | ICD-10-CM

## 2017-10-09 DIAGNOSIS — C187 Malignant neoplasm of sigmoid colon: Secondary | ICD-10-CM

## 2017-10-09 DIAGNOSIS — C19 Malignant neoplasm of rectosigmoid junction: Secondary | ICD-10-CM

## 2017-10-09 NOTE — Progress Notes (Signed)
[facility] Patient Consult   Referring MD: Romana Juniper  Alex [name] 67 y.o.  1950/10/13    Reason for Referral: Lung cancer   HPI: Alex [name] was admitted with right lung pneumonia on 08/25/2017.  He underwent a right VATS and mini thoracotomy for drainage of an empyema.  He was discharged home on 09/04/2017. He reports developing constipation during and following the admission for pneumonia.  Laxatives did not help.  He developed nausea/vomiting and abdominal distention and presented to the emergency room 09/14/2017. A CT of the abdomen and pelvis on 09/14/2017 revealed a small empyema at the right lung base.  The liver appeared unremarkable.  Slight wall thickening was noted at the sigmoid colon with the remainder of the colon diffusely filled with fluid and air.  No stool.  Dr. Hilarie Fredrickson was consulted.  He was taken to a flexible sigmoidoscopy 09/18/2017.  A completely obstructing mass was found in the distal sigmoid colon.  The mass could not be traversed.  The mass began between 15 and 20 cm from the dentate line.  The mass was biopsied and the area distal to the mass was tattooed.  2 additional polyps were found in the rectum and rectosigmoid colon.  The polyps were not removed.  The pathology from the distal sigmoid colon biopsy revealed a tubular adenoma with focal high-grade dysplasia.  He was taken to the operating room by Dr. Windle Guard on 09/20/2017 for an exploratory laparotomy, rectosigmoid resection and end colostomy.  There was no evidence of metastatic disease.  The tumor was palpated proximal to the rectosigmoid junction and the distal tattoo was visualized.  The pathology [id number]) revealed an invasive well-differentiated adenocarcinoma.  Tumor extended through the muscularis propria.  The resection margins were negative.  No lymphovascular or perineural invasion.  No tumor deposits.  13 lymph nodes were negative for metastatic carcinoma.  The tumor  returned MSI-stable with no loss of mismatch repair protein expression.  The tumor was noted to involve the rectosigmoid junction with focal involvement of both the rectum and sigmoid colon.  No polyps were identified.  The colostomy is functioning well.  Past Medical History:  Diagnosis Date  . Aortic atherosclerosis ([place]) 08/27/2017  . Atherosclerosis of arteries   . Carotid artery occlusion   . Empyema ([place]) 08/27/2017  . High cholesterol   . Hypertension   . Large bowel obstruction ([place]) 09/13/2017  . Pulmonary nodule, right  08/04/2017  . Stroke [facility]) 2010   denies residual on 09/18/2017    .  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 ANKLE FRACTURE SURGERY Right 1984  . DECORTICATION Right 08/30/2017   Procedure: DECORTICATION of right lower lung lobe;  Surgeon: Prescott Gum, Collier Salina, MD;  Location: [facility];  Service: Thoracic;  Laterality: Right;  . FLEXIBLE SIGMOIDOSCOPY N/A 09/18/2017   Procedure: FLEXIBLE SIGMOIDOSCOPY;  Surgeon: Jerene Bears, MD;  Location[facility] ENDOSCOPY;  Service: Gastroenterology;  Laterality: N/A;  . FRACTURE SURGERY    . PARTIAL COLECTOMY N/A 09/20/2017   Procedure: OPEN PARTIAL COLECTOMY WITH COLOSTOMY;  Surgeon: Clovis Riley, MD;  Location: [facility];  Service: General;  Laterality: N/A;  . VIDEO ASSISTED THORACOSCOPY (VATS)/EMPYEMA Right 08/30/2017   Procedure: VIDEO ASSISTED THORACOSCOPY (VATS)/EMPYEMA   ;  Surgeon: Ivin Poot, MD;  Location: [facility];  Service: Thoracic;  Laterality: Right;    Medications: Reviewed  Allergies: No Known Allergies  Family history: He had  12 siblings.  A brother had "neck" cancer.  No other family history of cancer.  Social History:   He lives in [place].  He works [employer].  No cigarette or alcohol use.  No transfusion history.  No risk factor for HIV or hepatitis.  ROS:   Positives include: Constipation beginning April 2019, pneumonia  March 2019  A complete ROS was otherwise negative.  Physical Exam:  Blood pressure (!) 142/92, pulse 77, temperature 97.8 F (36.6 C), temperature source Oral, resp. rate 19, height '5\' 6"'  (1.676 m), weight 167 lb 12.8 oz (76.1 kg), SpO2 99 %.  HEENT: Multiple missing teeth, oropharynx without visible mass, neck without mass Lungs: Decreased breath sounds at the right lower chest, no respiratory distress Cardiac: Regular rate and rhythm Abdomen: No hepatosplenomegaly, healed midline incision, left lower quadrant colostomy GU: Uncircumcised male, testes without mass Vascular: No leg edema Lymph nodes: No cervical, supraclavicular, axillary, or inguinal nodes Neurologic: Alert and oriented, the motor exam appears intact in the upper and lower extremities Skin: No rash, tattoo with the left arm Musculoskeletal: No spine tenderness   LAB:  CBC  Lab Results  Component Value Date   WBC 7.7 09/25/2017   HGB 7.6 (L) 09/25/2017   HCT 24.5 (L) 09/25/2017   MCV 84.2 09/25/2017   PLT 326 09/25/2017   NEUTROABS 5.2 09/15/2017        CMP     Component Value Date/Time   NA 137 09/25/2017 0346   K 4.0 09/25/2017 0346   CL 103 09/25/2017 0346   CO2 28 09/25/2017 0346   GLUCOSE 104 (H) 09/25/2017 0346   BUN 5 (L) 09/25/2017 0346   CREATININE 0.69 09/25/2017 0346   CALCIUM 8.4 (L) 09/25/2017 0346   PROT 5.3 (L) 09/25/2017 0346   ALBUMIN 2.7 (L) 09/25/2017 0346   AST 24 09/25/2017 0346   ALT 30 09/25/2017 0346   ALT PENDING 09/25/2017 0346   ALKPHOS 46 09/25/2017 0346   BILITOT 0.7 09/25/2017 0346   GFRNONAA >60 09/25/2017 0346   GFRAA >60 09/25/2017 0346     Lab Results  Component Value Date   CEA1 1.0 09/25/2017    Imaging:  As per HPI-CT images from 09/14/2017 and   Assessment/Plan:   1. Adenocarcinoma of the rectosigmoid colon, status post a partial colectomy and end colostomy 09/20/2017  Well-differentiated adenocarcinoma, 0/13 lymph nodes positive, MSI-stable,  no loss of mismatch repair protein expression  Tumor involving the rectosigmoid junction  Sigmoidoscopy 09/18/2017- distal sigmoid colon tumor beginning between 15 and 20 cm from the dentate line, completely obstructing  CT abdomen/pelvis 09/14/2017-sigmoid thickening no evidence of metastatic disease  2 mm right upper lobe nodule on CT 08/04/2017  2. Right lung pneumonia/empyema March 2019   Right VATS, drainage of empyema, and decortication of the right lower lobe 08/31/2017  3.   CVA in 2010  4.  Rectal and active sigmoid polyps noted on flexible sigmoidoscopy 09/18/2017   Disposition:   Alex [name] was diagnosed with adenocarcinoma of the rectosigmoid colon after presenting with a sigmoid obstruction.  I discussed the prognosis and reviewed details of the surgical pathology report with Alex [name] and his daughter.  He has a good prognosis for a long-term disease-free survival.  He has been diagnosed with stage II colorectal cancer.  I do not recommend adjuvant systemic chemotherapy if the tumor is deemed to be centered in the colon as opposed to the rectum.  The only potential high risk feature of  his tumor is the presentation with a bowel obstruction.  I will present his case at the GI tumor conference.  I will get the opinion of the surgeons, gastroenterologist, and radiation oncologist regarding the tumor classification and indication for adjuvant therapy.  He does not appear to have hereditary non-polyposis colon cancer syndrome.  His family members are at increased risk for colorectal cancer and should receive appropriate screening.  We discussed diet and exercise maneuvers that may decrease the risk of developing colon cancer.  He will follow-up with Dr. Hilarie Fredrickson for a completion colonoscopy and future surveillance.  Alex [name] will return for an office visit and CEA in 6 months.  50 minutes were spent with the patient today.  The majority of the time was used for counseling and  coordination of care.  Betsy Coder, MD  10/09/2017, 2:27 PM

## 2017-10-09 NOTE — Progress Notes (Signed)
  Oncology Nurse Navigator Documentation  Navigator Location: [facility] (10/09/17 1537)   )Navigator Encounter Type: Initial MedOnc (10/09/17 1537) Met with patient and step-daughter during initial consult with Dr. Benay Spice. At this point patient will not need Chemotherapy but will return in 6 months for CEA. Patient reports having difficult time connecting with [facility] to receive Ostomy Supplies. After several calls I spoke with someone at [facility] and they did not receive an order from [facility] at time of discharge. I  reached out to the inpatient case manager Magdalen Spatz RN BSN [phone] to ask that she follow-up with [facility] which she agreed to do. I will follow up with patient in a few days.                               Interventions: Other(Called [facility] to request that RN reach out to pt) (10/09/17 1537)            Acuity: Level 1 (10/09/17 1537) Acuity Level 1: Minimal follow up required (10/09/17 1537)

## 2017-10-11 LAB — HEPATITIS C VRS RNA DETECT BY PCR-QUAL: HEPATITIS C VRS RNA BY PCR-QUAL: NEGATIVE

## 2017-10-11 LAB — ACID FAST CULTURE WITH REFLEXED SENSITIVITIES (MYCOBACTERIA): Acid Fast Culture: NEGATIVE

## 2017-10-12 ENCOUNTER — Encounter: Payer: Self-pay | Admitting: Hematology

## 2017-10-12 NOTE — Progress Notes (Signed)
Reviewed chart from 3 day look-back list for possible available assistance.  Patient uninsured and was screened for Medicaid by hospital counselor at recent admission notes listed below:  Financial Counseling Review Complete    09/13/17 dos---reviewed medical records and fc notes--a medicaid program was identified--pt is aged--pt did state he had more that 2000 dollars in the bank other wise not interested in  providing any more information nor applying for medicare or medicaid--acct closed--pt not eligible for charity d/t failure to comply with medicaid processquality review Complete.

## 2017-10-17 ENCOUNTER — Other Ambulatory Visit: Payer: Self-pay

## 2017-10-17 NOTE — Progress Notes (Signed)
Tumor Board Discussion 10/17/17 - Observation - No Adjuvant Chemotherapy. Completion colonoscopy in 2-3 months.

## 2017-11-06 ENCOUNTER — Other Ambulatory Visit: Payer: Self-pay | Admitting: Cardiothoracic Surgery

## 2017-11-06 DIAGNOSIS — J869 Pyothorax without fistula: Secondary | ICD-10-CM

## 2017-11-06 NOTE — Progress Notes (Unsigned)
cxtr

## 2017-11-07 ENCOUNTER — Encounter: Payer: Self-pay | Admitting: Cardiothoracic Surgery

## 2017-11-07 ENCOUNTER — Ambulatory Visit (INDEPENDENT_AMBULATORY_CARE_PROVIDER_SITE_OTHER): Payer: Self-pay | Admitting: Cardiothoracic Surgery

## 2017-11-07 ENCOUNTER — Other Ambulatory Visit: Payer: Self-pay

## 2017-11-07 ENCOUNTER — Ambulatory Visit
Admission: RE | Admit: 2017-11-07 | Discharge: 2017-11-07 | Disposition: A | Discharge status: Home / Self Care | Payer: Self-pay | Source: Ambulatory Visit | Attending: Cardiothoracic Surgery | Admitting: Cardiothoracic Surgery

## 2017-11-07 VITALS — BP 115/67 | HR 65 | Resp 16 | Ht 66.0 in | Wt 181.6 lb

## 2017-11-07 DIAGNOSIS — J869 Pyothorax without fistula: Secondary | ICD-10-CM

## 2017-11-07 DIAGNOSIS — Z09 Encounter for follow-up examination after completed treatment for conditions other than malignant neoplasm: Secondary | ICD-10-CM

## 2017-11-07 NOTE — Progress Notes (Signed)
PCP is Antonietta Jewel, MD Referring Provider is Brand Males, MD  Chief Complaint  Patient presents with  . Routine Post Op    s/p R VATS, DECORT, DR. of  EMPYEMA  08/30/17 with a CXR    HPI: Patient returns for final postop follow-up after right VATS, drainage of right empyema and decortication right lower lobe 2 months ago.  He denies any pulmonary problems or significant postthoracotomy pain.  Chest x-ray today is clear with mild volume loss at the right base from pleural thickening.  Patient also is recovering from recent urgent left colectomy for obstructing adenocarcinoma of the sigmoid colon-this was performed with colostomy and mucous fistula.  Pathology showed extension of the tumor through the muscularis but 14 lymph nodes all negative and surgical margins negative.  He states he will have his colostomy taken down in October.  The patient is a non-smoker. He has no restrictions now on his activities of the thoracotomy. Patient is recommended to get a flu influenza vaccine this fall.   Past Medical History:  Diagnosis Date  . Aortic atherosclerosis ([place]) 08/27/2017  . Atherosclerosis of arteries   . Carotid artery occlusion   . Empyema ([place]) 08/27/2017  . High cholesterol   . Hypertension   . Large bowel obstruction ([place]) 09/13/2017  . Pulmonary nodule, right 08/27/2017  . Stroke [facility]) 2010   denies residual on 09/18/2017    Past Surgical History:  Procedure Laterality Date  . ANKLE FRACTURE SURGERY Right 1984  . DECORTICATION Right 08/30/2017   Procedure: DECORTICATION of right lower lung lobe;  Surgeon: Prescott Gum, Collier Salina, MD;  Location: [facility];  Service: Thoracic;  Laterality: Right;  . FLEXIBLE SIGMOIDOSCOPY N/A 09/18/2017   Procedure: FLEXIBLE SIGMOIDOSCOPY;  Surgeon: Jerene Bears, MD;  Location: [facility] ENDOSCOPY;  Service: Gastroenterology;  Laterality: N/A;  . FRACTURE SURGERY    . PARTIAL COLECTOMY N/A 09/20/2017   Procedure: OPEN PARTIAL COLECTOMY WITH COLOSTOMY;  Surgeon:  Clovis Riley, MD;  Location: [facility];  Service: General;  Laterality: N/A;  . VIDEO ASSISTED THORACOSCOPY (VATS)/EMPYEMA Right 08/30/2017   Procedure: VIDEO ASSISTED THORACOSCOPY (VATS)/EMPYEMA   ;  Surgeon: Ivin Poot, MD;  Location[facility] OR;  Service: Thoracic;  Laterality: Right;    Family History  Problem Relation Age of Onset  . Heart disease Other   . Hypertension Father   . Stroke Father   . Heart disease Father   . Diabetes Sister   . Diabetes Brother     Social History Social History   Tobacco Use  . Smoking status: Never Smoker  . Smokeless tobacco: Never Used  Substance Use Topics  . Alcohol use: Not Currently    Alcohol/week: 0.0 oz    Comment: 09/18/2017 "nothing since the 1990s"  . Drug use: Yes    Types: Marijuana    Comment: 09/18/2017 "nothing since the 1980s"    Current Outpatient Medications  Medication Sig Dispense Refill  . acetaminophen (TYLENOL) 500 MG tablet Take 2 tablets (1,000 mg total) by mouth every 6 (six) hours as needed. 30 tablet 0  . feeding supplement, ENSURE ENLIVE, (ENSURE ENLIVE) LIQD Take 237 mLs by mouth 2 (two) times daily between meals. 237 mL 12  . ferrous gluconate (FERGON) 324 MG tablet Take 1 tablet (324 mg total) by mouth 2 (two) times daily with a meal. 60 tablet 0  . hydrochlorothiazide (HYDRODIURIL) 25 MG tablet Take 25 mg by mouth daily.    . polyethylene glycol (MIRALAX / GLYCOLAX)  packet Take 17 g by mouth daily. 14 each 0  . simvastatin (ZOCOR) 10 MG tablet Take 20 mg by mouth at bedtime.      No current facility-administered medications for this visit.     No Known Allergies  Review of Systems  Gained 15 pounds Not taking pain pills Strength improving significantly  BP 115/67 (BP Location: Left Arm, Patient Position: Sitting, Cuff Size: Large)   Pulse 65   Resp 16   Ht 5\' 6"  (1.676 m)   Wt 181 lb 9.6 oz (82.4 kg)   SpO2 98% Comment: ON RA  BMI 29.31 kg/m  Physical Exam      Exam    General- alert  and comfortable    Neck- no JVD, no cervical adenopathy palpable, no carotid bruit   Lungs- clear without rales, wheezes   Cor- regular rate and rhythm, no murmur , gallop   Abdomen- soft, non-tender   Extremities - warm, non-tender, minimal edema   Neuro- oriented, appropriate, no focal weakness   Diagnostic Tests: Chest x-ray clear  Impression: Patient is recovered from right VATS for drainage of empyema and decortication right lower lobe  Plan: Return as needed   Len Childs, MD [facility] [phone]

## 2017-11-28 ENCOUNTER — Encounter: Payer: Self-pay | Admitting: *Deleted

## 2017-12-17 ENCOUNTER — Encounter: Payer: Self-pay | Admitting: Internal Medicine

## 2017-12-17 ENCOUNTER — Ambulatory Visit (INDEPENDENT_AMBULATORY_CARE_PROVIDER_SITE_OTHER): Payer: Self-pay | Admitting: Internal Medicine

## 2017-12-17 VITALS — BP 114/66 | HR 64 | Ht 66.75 in | Wt 189.2 lb

## 2017-12-17 DIAGNOSIS — C189 Malignant neoplasm of colon, unspecified: Secondary | ICD-10-CM

## 2017-12-17 DIAGNOSIS — Z7902 Long term (current) use of antithrombotics/antiplatelets: Secondary | ICD-10-CM

## 2017-12-17 DIAGNOSIS — Z933 Colostomy status: Secondary | ICD-10-CM

## 2017-12-17 MED ORDER — SUPREP BOWEL PREP KIT 17.5-3.13-1.6 GM/177ML PO SOLN: 1.0000 | ORAL | 0 refills | Status: DC

## 2017-12-17 NOTE — Progress Notes (Signed)
Patient ID: Alex [name], male   DOB: 1951/03/16, 67 y.o.   MRN: [id number] HPI: Alex [name] is a 67 year old male with a past medical history of distal sigmoid cancer initially causing obstruction status post resection with diverting colostomy with mucous fistula, history of right lung pneumonia status post right VATS and minithoracotomy in April 2019 who is seen in follow-up.  He is here today with his daughter.  I met him in the hospital earlier this year when he was diagnosed with sigmoid cancer.  Colonoscopy was considered but impossible due to obstruction.  Flex sig showed a mass biopsies only showing high-grade dysplasia but later after resection he was found to have a stage II colon cancer.  He is done remarkably well and is following with Dr. Benay Spice with oncology, Dr. Windle Guard with surgery and [name] with CT surgery.  He denies abdominal pain.  His colostomy is functioning well and without pain or bleeding.  He denies upper GI and hepatobiliary complaint today.  He reports he is back on Plavix given his history of CVA and carotid artery atherosclerosis.  Of note at resection 13 lymph nodes were negative for metastatic carcinoma.  His tumor was MSI stable with no loss of mismatch repair protein.  There was no lymphovascular or perineural invasion.  Past Medical History:  Diagnosis Date  . 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 Aortic atherosclerosis ([place]) 08/27/2017  . Atherosclerosis of arteries   . Carotid artery occlusion   . Colon polyps   . Empyema ([place]) 08/27/2017  . High cholesterol   . Hypertension   . Large bowel obstruction ([place]) 09/13/2017  . Pneumonia   . Pulmonary nodule, right 08/27/2017  . Stroke [facility]) 2010   denies residual on 09/18/2017    Past Surgical History:  Procedure Laterality Date  . ANKLE FRACTURE SURGERY Right 1984  . DECORTICATION Right 08/30/2017   Procedure: DECORTICATION of right lower lung  lobe;  Surgeon: Prescott Gum, Collier Salina, MD;  Location: [facility];  Service: Thoracic;  Laterality: Right;  . FLEXIBLE SIGMOIDOSCOPY N/A 09/18/2017   Procedure: FLEXIBLE SIGMOIDOSCOPY;  Surgeon: Jerene Bears, MD;  Location: [facility] ENDOSCOPY;  Service: Gastroenterology;  Laterality: N/A;  . PARTIAL COLECTOMY N/A 09/20/2017   Procedure: OPEN PARTIAL COLECTOMY WITH COLOSTOMY;  Surgeon: Clovis Riley, MD;  Location: [place];  Service: General;  Laterality: N/A;  . VIDEO ASSISTED THORACOSCOPY (VATS)/EMPYEMA Right 08/30/2017   Procedure: VIDEO ASSISTED THORACOSCOPY (VATS)/EMPYEMA   ;  Surgeon: Ivin Poot, MD;  Location: [place];  Service: Thoracic;  Laterality: Right;    Outpatient Medications Prior to Visit  Medication Sig Dispense Refill  . acetaminophen (TYLENOL) 500 MG tablet Take 2 tablets (1,000 mg total) by mouth every 6 (six) hours as needed. (Patient taking differently: Take 1,000 mg by mouth as needed. ) 30 tablet 0  . ferrous sulfate 325 (65 FE) MG tablet Take 325 mg by mouth 2 (two) times daily with a meal.    . hydrochlorothiazide (HYDRODIURIL) 25 MG tablet Take 25 mg by mouth daily.    . Nutritional Supplements (BOOST PLUS PO) Take 1 Can by mouth as needed.    . simvastatin (ZOCOR) 10 MG tablet Take 20 mg by mouth at bedtime.     . feeding supplement, ENSURE ENLIVE, (ENSURE ENLIVE) LIQD Take 237 mLs by mouth 2 (two) times daily between meals. 237 mL 12  . ferrous gluconate (FERGON) 324 MG tablet Take  1 tablet (324 mg total) by mouth 2 (two) times daily with a meal. 60 tablet 0  . polyethylene glycol (MIRALAX / GLYCOLAX) packet Take 17 g by mouth daily. 14 each 0   No facility-administered medications prior to visit.     No Known Allergies  Family History  Problem Relation Age of Onset  . Heart disease Other   . Hypertension Father   . Stroke Father   . Heart disease Father   . Diabetes Sister   . Diabetes Brother   . Head & neck cancer Brother     Social History   Tobacco Use  .  Smoking status: Never Smoker  . Smokeless tobacco: Never Used  Substance Use Topics  . Alcohol use: Not Currently    Alcohol/week: 0.0 oz    Comment: 09/18/2017 "nothing since the 1990s"  . Drug use: Yes    Types: Marijuana    Comment: 09/18/2017 "nothing since the 1980s"    ROS: As per history of present illness, otherwise negative  BP 114/66 (BP Location: Left Arm, Patient Position: Sitting, Cuff Size: Normal)   Pulse 64   Ht 5' 6.75" (1.695 m) Comment: height measured without shoes  Wt 189 lb 4 oz (85.8 kg)   BMI 29.86 kg/m  Constitutional: Well-developed and well-nourished. No distress. HEENT: Normocephalic and atraumatic. Oropharynx is clear and moist. Conjunctivae are normal.  No scleral icterus. Neck: Neck supple. Trachea midline. Cardiovascular: Normal rate, regular rhythm and intact distal pulses. No M/R/G Pulmonary/chest: Effort normal and breath sounds normal. No wheezing, rales or rhonchi. Abdominal: Soft, nontender, nondistended. Bowel sounds active throughout.  Colostomy left mid abdomen Extremities: no clubbing, cyanosis, or edema Neurological: Alert and oriented to person place and time. Skin: Skin is warm and dry.  Psychiatric: Normal mood and affect. Behavior is normal.   ASSESSMENT/PLAN: 67 year old male with a past medical history of distal sigmoid cancer initially causing obstruction status post resection with diverting colostomy with mucous fistula, history of right lung pneumonia status post right VATS and minithoracotomy in April 2019 who is seen in follow-up.   1.  Stage II distal sigmoid adenocarcinoma status post resection/incomplete colonoscopy --he has not had a complete colonoscopy and so I have recommended this today.  He also had 2 rectal polyps not removed at the time of his flexible sigmoidoscopy.  We discussed the risk, benefits and alternatives to colonoscopy per stoma and also evaluation of the rectal stump and after this discussion he is  agreeable and wishes to proceed. He will likely have ostomy takedown later this year with Dr. Windle Guard We will need to hold Plavix 5 days before colonoscopy.  We will contact Dr. Sheryle Hail for permission to hold Plavix. I recommended his first-degree relatives have colonoscopy and if not yet screening age for those younger relatives to begin screening at age 63.  His daughter voiced understanding  Hold Plavix 5 days before procedure - will instruct when and how to resume after procedure. Risks and benefits of procedure including bleeding, perforation, infection, missed lesions, medication reactions and possible hospitalization or surgery if complications occur explained. Additional rare but real risk of cardiovascular event such as heart attack or ischemia/infarct of other organs off Plavix explained and need to seek urgent help if this occurs. Will communicate by phone or EMR with patient's prescribing provider that to confirm holding Plavix is reasonable in this case.      RU:EAVWUJ, Sami, Md [address] [place], [place] [address]

## 2017-12-17 NOTE — Patient Instructions (Signed)
You have been scheduled for a colonoscopy. Please follow written instructions given to you at your visit today.  Please pick up your prep supplies at the pharmacy within the next 1-3 days. If you use inhalers (even only as needed), please bring them with you on the day of your procedure. Your physician has requested that you go to www.startemmi.com and enter the access code given to you at your visit today. This web site gives a general overview about your procedure. However, you should still follow specific instructions given to you by our office regarding your preparation for the procedure.  If you are age 26 or older, your body mass index should be between 23-30. Your Body mass index is 29.86 kg/m. If this is out of the aforementioned range listed, please consider follow up with your Primary Care Provider.  If you are age 68 or younger, your body mass index should be between 19-25. Your Body mass index is 29.86 kg/m. If this is out of the aformentioned range listed, please consider follow up with your Primary Care Provider.

## 2017-12-18 ENCOUNTER — Telehealth: Payer: Self-pay | Admitting: *Deleted

## 2017-12-18 NOTE — Telephone Encounter (Signed)
I have spoken to patient and he verbalizes understanding that Dr Volney American has given the okay for him to hold Plavix 5 days prior to his upcoming colonoscopy procedure.

## 2017-12-18 NOTE — Telephone Encounter (Signed)
I have gotten faxed correspondence from Dr Willy Eddy that patient may hold Plavix 5 days prior to his scheduled colonoscopy procedure on 02/07/18 (see "media" tab for original paperwork). I have left a voicemail for patient to call back.

## 2018-02-07 ENCOUNTER — Encounter: Payer: Self-pay | Admitting: Internal Medicine

## 2018-02-07 ENCOUNTER — Ambulatory Visit (AMBULATORY_SURGERY_CENTER): Payer: Self-pay | Admitting: Internal Medicine

## 2018-02-07 VITALS — BP 124/77 | HR 57 | Temp 99.3°F | Resp 16 | Ht 66.0 in | Wt 189.0 lb

## 2018-02-07 DIAGNOSIS — D122 Benign neoplasm of ascending colon: Secondary | ICD-10-CM

## 2018-02-07 DIAGNOSIS — D129 Benign neoplasm of anus and anal canal: Secondary | ICD-10-CM

## 2018-02-07 DIAGNOSIS — D128 Benign neoplasm of rectum: Secondary | ICD-10-CM

## 2018-02-07 DIAGNOSIS — D120 Benign neoplasm of cecum: Secondary | ICD-10-CM

## 2018-02-07 DIAGNOSIS — C189 Malignant neoplasm of colon, unspecified: Secondary | ICD-10-CM

## 2018-02-07 DIAGNOSIS — D123 Benign neoplasm of transverse colon: Secondary | ICD-10-CM

## 2018-02-07 DIAGNOSIS — D124 Benign neoplasm of descending colon: Secondary | ICD-10-CM

## 2018-02-07 DIAGNOSIS — Z8509 Personal history of malignant neoplasm of other digestive organs: Secondary | ICD-10-CM

## 2018-02-07 DIAGNOSIS — D127 Benign neoplasm of rectosigmoid junction: Secondary | ICD-10-CM

## 2018-02-07 MED ORDER — SODIUM CHLORIDE 0.9 % IV SOLN: 500.0000 mL | Freq: Once | INTRAVENOUS | Status: DC

## 2018-02-07 NOTE — Op Note (Signed)
[facility] Patient Name: Alex [name] Procedure Date: 02/07/2018 2:00 PM MRN: [id number] Endoscopist: Jerene Bears , MD Age: 67 Referring MD:  Date of Birth: 1951/05/28 Gender: Male Account #: [id number] Procedure:                Colonoscopy Indications:              High risk colon cancer surveillance: Personal                            history of colon cancer, history of incomplete                            colonoscopy due to obstructing rectosigmoid colon                            cancer in April 2019; s/p descending colostomy with                            mucus fistula Medicines:                Monitored Anesthesia Care Procedure:                Pre-Anesthesia Assessment:                           - Prior to the procedure, a History and Physical                            was performed, and patient medications and                            allergies were reviewed. The patient's tolerance of                            previous anesthesia was also reviewed. The risks                            and benefits of the procedure and the sedation                            options and risks were discussed with the patient.                            All questions were answered, and informed consent                            was obtained. Prior Anticoagulants: The patient has                            taken Plavix (clopidogrel), last dose was 5 days                            prior to procedure. ASA Grade Assessment: III - A  patient with severe systemic disease. After                            reviewing the risks and benefits, the patient was                            deemed in satisfactory condition to undergo the                            procedure.                           After obtaining informed consent, the colonoscope                            was passed under direct vision. Throughout the                            procedure,  the patient's blood pressure, pulse, and                            oxygen saturations were monitored continuously. The                            Colonoscope was introduced through the descending                            colostomy and advanced to the cecum, identified by                            appendiceal orifice and ileocecal valve. Then the                            scope was introduced into the rectum/rectosigmoid                            stump per anus. The colonoscopy was performed                            without difficulty. The patient tolerated the                            procedure well. The quality of the bowel                            preparation was good. The ileocecal valve,                            appendiceal orifice, and rectum were photographed. Scope In: 2:09:59 PM Scope Out: 2:47:36 PM Scope Withdrawal Time: 0 hours 35 minutes 16 seconds  Total Procedure Duration: 0 hours 37 minutes 37 seconds  Findings:                 There was evidence of a widely patent end colostomy  in the descending colon. This was characterized by                            healthy appearing mucosa.                           The digital rectal exam was normal.                           A 5 mm polyp was found in the cecum. The polyp was                            sessile. The polyp was removed with a cold snare.                            Resection and retrieval were complete.                           Four sessile polyps were found in the ascending                            colon. The polyps were 3 to 6 mm in size. These                            polyps were removed with a cold snare. Resection                            and retrieval were complete.                           Six sessile polyps were found in the transverse                            colon. The polyps were 4 to 8 mm in size. These                            polyps were removed with a  cold snare. Resection                            and retrieval were complete.                           Two sessile polyps were found in the descending                            colon. The polyps were 4 to 6 mm in size. These                            polyps were removed with a cold snare. Resection                            and retrieval were complete.  A few small-mouthed diverticula were found in the                            descending colon.                           There was evidence of a prior end sigmoid colostomy                            and rectal stump to the recto-sigmoid colon. This                            was non-patent and was characterized by visible                            sutures and polypoid tissue. This was biopsied with                            a cold forceps for histology to exclude adenoma.                           A 6 mm polyp was found in the recto-sigmoid colon.                            The polyp was sessile. The polyp was removed with a                            hot snare. Resection and retrieval were complete.                           A 10 mm polyp was found in the rectum. The polyp                            was semi-pedunculated. The polyp was removed with a                            hot snare. Resection and retrieval were complete.                           Internal hemorrhoids were found during endoscopy.                            The hemorrhoids were small. Complications:            No immediate complications. Estimated Blood Loss:     Estimated blood loss was minimal. Impression:               - Widely patent end colostomy with healthy                            appearing mucosa in the descending colon.                           - One 5 mm polyp  in the cecum, removed with a cold                            snare. Resected and retrieved.                           - Four 3 to 6 mm polyps in the ascending colon,                             removed with a cold snare. Resected and retrieved.                           - Six 4 to 8 mm polyps in the transverse colon,                            removed with a cold snare. Resected and retrieved.                           - Two 4 to 6 mm polyps in the descending colon,                            removed with a cold snare. Resected and retrieved.                           - Diverticulosis in the descending colon.                           - Non-patent end rectosigmoid stump, characterized                            by visible sutures and polypoid tissue. Biopsied.                           - One 6 mm polyp at the recto-sigmoid colon,                            removed with a hot snare. Resected and retrieved.                           - One 10 mm polyp in the rectum, removed with a hot                            snare. Resected and retrieved.                           - Internal hemorrhoids. Recommendation:           - Patient has a contact number available for                            emergencies. The signs and symptoms of potential  delayed complications were discussed with the                            patient. Return to normal activities tomorrow.                            Written discharge instructions were provided to the                            patient.                           - Resume previous diet.                           - Continue present medications.                           - Await pathology results.                           - Repeat colonoscopy is recommended for                            surveillance of multiple polyps. The colonoscopy                            date will be determined after pathology results                            from today's exam become available for review.                           - Repeat colonoscopy in 1 year.                           - Resume Plavix (clopidogrel) at prior  dose                            tomorrow. Refer to managing physician for further                            adjustment of therapy.                           - No ibuprofen, naproxen, or other non-steroidal                            anti-inflammatory drugs for 3 weeks after polyp                            removal. Jerene Bears, MD 02/07/2018 2:59:55 PM This report has been signed electronically.

## 2018-02-07 NOTE — Patient Instructions (Signed)
Thank you for allowing [place] to care for you today!  Resume previous diet and medications.  Return to normal activities tomorrow.  Await pathology results, approx 2 weeks.  Next colonoscopy will be decided after pathology is returned.  Repeat colonoscopy in 1 year.  Resume Plavix at prior dose tomorrow.  No Ibuprofen, Naproxen or other non-steroidal anti-inflammatory medications for 3 weeks.      YOU HAD AN ENDOSCOPIC PROCEDURE TODAY AT [facility]:   Refer to the procedure report that was given to you for any specific questions about what was found during the examination.  If the procedure report does not answer your questions, please call your gastroenterologist to clarify.  If you requested that your care partner not be given the details of your procedure findings, then the procedure report has been included in a sealed envelope for you to review at your convenience later.  YOU SHOULD EXPECT: Some feelings of bloating in the abdomen. Passage of more gas than usual.  Walking can help get rid of the air that was put into your GI tract during the procedure and reduce the bloating. If you had a lower endoscopy (such as a colonoscopy or flexible sigmoidoscopy) you may notice spotting of blood in your stool or on the toilet paper. If you underwent a bowel prep for your procedure, you may not have a normal bowel movement for a few days.  Please Note:  You might notice some irritation and congestion in your nose or some drainage.  This is from the oxygen used during your procedure.  There is no need for concern and it should clear up in a day or so.  SYMPTOMS TO REPORT IMMEDIATELY:   Following lower endoscopy (colonoscopy or flexible sigmoidoscopy):  Excessive amounts of blood in the stool  Significant tenderness or worsening of abdominal pains  Swelling of the abdomen that is new, acute  Fever of 100F or higher    For urgent or emergent issues, a gastroenterologist can  be reached at any hour by calling [phone].   DIET:  We do recommend a small meal at first, but then you may proceed to your regular diet.  Drink plenty of fluids but you should avoid alcoholic beverages for 24 hours.  ACTIVITY:  You should plan to take it easy for the rest of today and you should NOT DRIVE or use heavy machinery until tomorrow (because of the sedation medicines used during the test).    FOLLOW UP: Our staff will call the number listed on your records the next business day following your procedure to check on you and address any questions or concerns that you may have regarding the information given to you following your procedure. If we do not reach you, we will leave a message.  However, if you are feeling well and you are not experiencing any problems, there is no need to return our call.  We will assume that you have returned to your regular daily activities without incident.  If any biopsies were taken you will be contacted by phone or by letter within the next 1-3 weeks.  Please call us at [phone] if you have not heard about the biopsies in 3 weeks.    SIGNATURES/CONFIDENTIALITY: You and/or your care partner have signed paperwork which will be entered into your electronic medical record.  These signatures attest to the fact that that the information above on your After Visit Summary has been reviewed and is understood.  Full  responsibility of the confidentiality of this discharge information lies with you and/or your care-partner.

## 2018-02-07 NOTE — Progress Notes (Signed)
Called to room to assist during endoscopic procedure.  Patient ID and intended procedure confirmed with present staff. Received instructions for my participation in the procedure from the performing physician.  

## 2018-02-07 NOTE — Progress Notes (Signed)
To PACU, VSS. Report to [email or web address] 

## 2018-02-08 ENCOUNTER — Telehealth: Payer: Self-pay

## 2018-02-08 NOTE — Telephone Encounter (Signed)
  Follow up Call-  Call back number 02/07/2018  Post procedure Call Back phone  # [phone]  Permission to leave phone message Yes  Some recent data might be hidden     Patient questions:  Do you have a fever, pain , or abdominal swelling? No. Pain Score  0 *  Have you tolerated food without any problems? Yes.    Have you been able to return to your normal activities? Yes.    Do you have any questions about your discharge instructions: Diet   No. Medications  No. Follow up visit  No.  Do you have questions or concerns about your Care? No.  Actions: * If pain score is 4 or above: No action needed, pain <4.

## 2018-02-12 ENCOUNTER — Encounter: Payer: Self-pay | Admitting: Internal Medicine

## 2018-04-09 ENCOUNTER — Inpatient Hospital Stay: Payer: Self-pay | Attending: Oncology

## 2018-04-09 DIAGNOSIS — C19 Malignant neoplasm of rectosigmoid junction: Secondary | ICD-10-CM | POA: Insufficient documentation

## 2018-04-09 DIAGNOSIS — C187 Malignant neoplasm of sigmoid colon: Secondary | ICD-10-CM

## 2018-04-09 LAB — CEA (IN HOUSE-CHCC): CEA (CHCC-In House): <1 ng/mL (ref 0.00–5.00)

## 2018-04-10 ENCOUNTER — Telehealth: Payer: Self-pay | Admitting: *Deleted

## 2018-04-10 NOTE — Telephone Encounter (Signed)
-----   Message from Ladell Pier, MD sent at 04/09/2018  8:26 PM EST ----- Please call patient, cea is normal, should have been scheduled for office visit with lab, please schedule next 3 weeks Sherrill or lisa

## 2018-04-10 NOTE — Telephone Encounter (Signed)
Left VM for patient to call office-VM did not state his name. Scheduling request sent.

## 2018-04-11 NOTE — Telephone Encounter (Signed)
No labs

## 2018-04-15 ENCOUNTER — Telehealth: Payer: Self-pay | Admitting: Oncology

## 2018-04-15 NOTE — Telephone Encounter (Signed)
Scheduled per 11/18 sch message - pt is aware of appt on 12/4

## 2018-05-01 ENCOUNTER — Inpatient Hospital Stay: Payer: Self-pay | Attending: Nurse Practitioner | Admitting: Nurse Practitioner

## 2018-05-01 ENCOUNTER — Ambulatory Visit: Payer: Self-pay | Admitting: Nurse Practitioner

## 2018-05-01 ENCOUNTER — Encounter: Payer: Self-pay | Admitting: Nurse Practitioner

## 2018-05-01 ENCOUNTER — Telehealth: Payer: Self-pay

## 2018-05-01 ENCOUNTER — Other Ambulatory Visit: Payer: Self-pay

## 2018-05-01 ENCOUNTER — Other Ambulatory Visit (HOSPITAL_COMMUNITY): Payer: Self-pay | Admitting: Surgery

## 2018-05-01 VITALS — BP 136/78 | HR 68 | Temp 98.2°F | Resp 17 | Ht 66.0 in | Wt 211.2 lb

## 2018-05-01 DIAGNOSIS — C187 Malignant neoplasm of sigmoid colon: Secondary | ICD-10-CM

## 2018-05-01 DIAGNOSIS — C189 Malignant neoplasm of colon, unspecified: Secondary | ICD-10-CM

## 2018-05-01 DIAGNOSIS — Z8673 Personal history of transient ischemic attack (TIA), and cerebral infarction without residual deficits: Secondary | ICD-10-CM | POA: Insufficient documentation

## 2018-05-01 DIAGNOSIS — Z933 Colostomy status: Secondary | ICD-10-CM | POA: Insufficient documentation

## 2018-05-01 DIAGNOSIS — Z86010 Personal history of colonic polyps: Secondary | ICD-10-CM | POA: Insufficient documentation

## 2018-05-01 DIAGNOSIS — Z23 Encounter for immunization: Secondary | ICD-10-CM | POA: Insufficient documentation

## 2018-05-01 DIAGNOSIS — Z9049 Acquired absence of other specified parts of digestive tract: Secondary | ICD-10-CM | POA: Insufficient documentation

## 2018-05-01 DIAGNOSIS — Z85038 Personal history of other malignant neoplasm of large intestine: Secondary | ICD-10-CM | POA: Insufficient documentation

## 2018-05-01 MED ORDER — INFLUENZA VAC SPLIT HIGH-DOSE 0.5 ML IM SUSY
0.5000 mL | PREFILLED_SYRINGE | Freq: Once | INTRAMUSCULAR | Status: AC
  Administered 2018-05-01: 0.5 mL via INTRAMUSCULAR
  Filled 2018-05-01: qty 0.5

## 2018-05-01 NOTE — Telephone Encounter (Signed)
Printed avs and calender of upcoming appointment. Per 12/4 los

## 2018-05-01 NOTE — Progress Notes (Signed)
  [place] OFFICE PROGRESS NOTE   Diagnosis: Colon cancer  INTERVAL HISTORY:   Alex [name] returns as scheduled.  He feels well.  Colostomy is functioning normally.  He reports a reversal is planned early next year.  He denies bleeding.  No abdominal pain.  He has a good appetite.  Objective:  Vital signs in last 24 hours:  Blood pressure 136/78, pulse 68, temperature 98.2 F (36.8 C), temperature source Oral, resp. rate 17, height '5\' 6"'$  (1.676 m), weight 211 lb 3.2 oz (95.8 kg), SpO2 96 %.    HEENT: Neck without mass. Lymphatics: No palpable cervical, supraclavicular, axillary or inguinal lymph nodes. Resp: Diminished breath sounds at the right lower chest.  No respiratory distress. Cardio: Regular rate and rhythm. GI: Abdomen soft and nontender.  No hepatomegaly.  Left lower quadrant colostomy. Vascular: No leg edema.   Lab Results:  Lab Results  Component Value Date   WBC 7.7 09/25/2017   HGB 7.6 (L) 09/25/2017   HCT 24.5 (L) 09/25/2017   MCV 84.2 09/25/2017   PLT 326 09/25/2017   NEUTROABS 5.2 09/15/2017   04/09/2018 CEA less than 1.0 Imaging:  No results found.  Medications: I have reviewed the patient's current medications.  Assessment/Plan: 1. 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 Well-differentiated adenocarcinoma, 0/13 lymph nodes positive, MSI-stable, no loss of mismatch repair protein expression ? Tumor involving the rectosigmoid junction ? Sigmoidoscopy 09/18/2017- distal sigmoid colon tumor beginning between 15 and 20 cm from the dentate line, completely obstructing ? CT abdomen/pelvis 09/14/2017-sigmoid thickening no evidence of metastatic disease ? 2 mm right upper lobe nodule on CT 08/04/2017 ? Colonoscopy 02/07/2018- polyps removed from the cecum, ascending colon, transverse colon, descending colon, rectosigmoid colon and rectum (tubular adenomas)  2. Right lung  pneumonia/empyema March 2019              Right VATS, drainage of empyema, and decortication of the right lower lobe 08/31/2017  3.   CVA in 2010  4.  Rectal and active sigmoid polyps noted on flexible sigmoidoscopy 09/18/2017   Disposition: Alex [name] remains in clinical remission from colon cancer.  The CEA from 04/09/2018 was in normal range.  He will continue colonoscopy surveillance with Dr. Hilarie Fredrickson.  He has requested to receive the influenza vaccine.  We will administer today.  He will return for a follow-up visit and CEA in 6 months.  He will contact the office in the interim with any problems.    Ned Card ANP/GNP-BC   05/01/2018  1:40 PM

## 2018-05-27 ENCOUNTER — Ambulatory Visit (HOSPITAL_COMMUNITY)
Admission: RE | Admit: 2018-05-27 | Discharge: 2018-05-27 | Disposition: A | Discharge status: Home / Self Care | Payer: Self-pay | Source: Ambulatory Visit | Attending: Surgery | Admitting: Surgery

## 2018-05-27 DIAGNOSIS — C189 Malignant neoplasm of colon, unspecified: Secondary | ICD-10-CM | POA: Insufficient documentation

## 2018-05-27 LAB — POCT I-STAT CREATININE: Creatinine, Ser: 1 mg/dL (ref 0.61–1.24)

## 2018-05-27 MED ORDER — SODIUM CHLORIDE (PF) 0.9 % IJ SOLN
INTRAMUSCULAR | Status: AC
  Filled 2018-05-27: qty 50

## 2018-05-27 MED ORDER — IOPAMIDOL (ISOVUE-300) INJECTION 61%
100.0000 mL | Freq: Once | INTRAVENOUS | Status: AC | PRN
  Administered 2018-05-27: 100 mL via INTRAVENOUS

## 2018-07-25 ENCOUNTER — Other Ambulatory Visit: Payer: Self-pay | Admitting: Surgery

## 2018-07-25 DIAGNOSIS — C189 Malignant neoplasm of colon, unspecified: Secondary | ICD-10-CM

## 2018-07-27 ENCOUNTER — Other Ambulatory Visit: Payer: Self-pay | Admitting: Surgery

## 2018-07-27 ENCOUNTER — Ambulatory Visit (HOSPITAL_COMMUNITY)
Admission: RE | Admit: 2018-07-27 | Discharge: 2018-07-27 | Disposition: A | Discharge status: Home / Self Care | Payer: Self-pay | Source: Ambulatory Visit | Attending: Surgery | Admitting: Surgery

## 2018-07-27 DIAGNOSIS — C189 Malignant neoplasm of colon, unspecified: Secondary | ICD-10-CM

## 2018-07-27 MED ORDER — GADOBUTROL 1 MMOL/ML IV SOLN
10.0000 mL | Freq: Once | INTRAVENOUS | Status: AC | PRN
  Administered 2018-07-27: 10 mL via INTRAVENOUS

## 2018-07-29 LAB — POCT I-STAT CREATININE: Creatinine, Ser: 1.1 mg/dL (ref 0.61–1.24)

## 2018-08-02 ENCOUNTER — Other Ambulatory Visit (HOSPITAL_COMMUNITY): Payer: Self-pay | Admitting: Surgery

## 2018-08-02 ENCOUNTER — Other Ambulatory Visit (HOSPITAL_COMMUNITY): Payer: Self-pay | Admitting: Internal Medicine

## 2018-08-02 DIAGNOSIS — C189 Malignant neoplasm of colon, unspecified: Secondary | ICD-10-CM

## 2018-08-06 ENCOUNTER — Telehealth: Payer: Self-pay | Admitting: *Deleted

## 2018-08-06 NOTE — Telephone Encounter (Signed)
Called patient to see NP/Dr. Benay Spice on 08/16/18 at 2:45 (per Dr. Benay Spice) after his biopsy on 08/14/18. He understands and agrees to the appointment.

## 2018-08-13 ENCOUNTER — Other Ambulatory Visit: Payer: Self-pay | Admitting: Student

## 2018-08-14 ENCOUNTER — Ambulatory Visit (HOSPITAL_COMMUNITY)
Admission: RE | Admit: 2018-08-14 | Discharge: 2018-08-14 | Disposition: A | Discharge status: Home / Self Care | Payer: Self-pay | Source: Ambulatory Visit | Attending: Surgery | Admitting: Surgery

## 2018-08-14 ENCOUNTER — Encounter (HOSPITAL_COMMUNITY): Payer: Self-pay

## 2018-08-14 ENCOUNTER — Other Ambulatory Visit: Payer: Self-pay

## 2018-08-14 DIAGNOSIS — C189 Malignant neoplasm of colon, unspecified: Secondary | ICD-10-CM | POA: Insufficient documentation

## 2018-08-14 DIAGNOSIS — Z79899 Other long term (current) drug therapy: Secondary | ICD-10-CM | POA: Insufficient documentation

## 2018-08-14 DIAGNOSIS — Z7982 Long term (current) use of aspirin: Secondary | ICD-10-CM | POA: Insufficient documentation

## 2018-08-14 DIAGNOSIS — E7800 Pure hypercholesterolemia, unspecified: Secondary | ICD-10-CM | POA: Insufficient documentation

## 2018-08-14 DIAGNOSIS — Z9049 Acquired absence of other specified parts of digestive tract: Secondary | ICD-10-CM | POA: Insufficient documentation

## 2018-08-14 DIAGNOSIS — K769 Liver disease, unspecified: Secondary | ICD-10-CM | POA: Insufficient documentation

## 2018-08-14 DIAGNOSIS — Z808 Family history of malignant neoplasm of other organs or systems: Secondary | ICD-10-CM | POA: Insufficient documentation

## 2018-08-14 DIAGNOSIS — I10 Essential (primary) hypertension: Secondary | ICD-10-CM | POA: Insufficient documentation

## 2018-08-14 DIAGNOSIS — Z8249 Family history of ischemic heart disease and other diseases of the circulatory system: Secondary | ICD-10-CM | POA: Insufficient documentation

## 2018-08-14 DIAGNOSIS — Z833 Family history of diabetes mellitus: Secondary | ICD-10-CM | POA: Insufficient documentation

## 2018-08-14 LAB — CBC
HCT: 44.6 % (ref 39.0–52.0)
Hemoglobin: 14.6 g/dL (ref 13.0–17.0)
MCH: 29.1 pg (ref 26.0–34.0)
MCHC: 32.7 g/dL (ref 30.0–36.0)
MCV: 88.8 fL (ref 80.0–100.0)
Platelets: 258 10*3/uL (ref 150–400)
RBC: 5.02 MIL/uL (ref 4.22–5.81)
RDW: 12 % (ref 11.5–15.5)
WBC: 9 10*3/uL (ref 4.0–10.5)
nRBC: 0 % (ref 0.0–0.2)

## 2018-08-14 LAB — PROTIME-INR
INR: 1 (ref 0.8–1.2)
Prothrombin Time: 13.4 seconds (ref 11.4–15.2)

## 2018-08-14 LAB — APTT: aPTT: 29 seconds (ref 24–36)

## 2018-08-14 MED ORDER — GELATIN ABSORBABLE 12-7 MM EX MISC
CUTANEOUS | Status: AC
  Filled 2018-08-14: qty 1

## 2018-08-14 MED ORDER — MIDAZOLAM HCL 2 MG/2ML IJ SOLN
INTRAMUSCULAR | Status: AC
  Filled 2018-08-14: qty 2

## 2018-08-14 MED ORDER — LIDOCAINE HCL (PF) 1 % IJ SOLN
INTRAMUSCULAR | Status: AC
  Filled 2018-08-14: qty 30

## 2018-08-14 MED ORDER — SODIUM CHLORIDE 0.9 % IV SOLN: INTRAVENOUS | Status: DC

## 2018-08-14 MED ORDER — FENTANYL CITRATE (PF) 100 MCG/2ML IJ SOLN
INTRAMUSCULAR | Status: AC
  Filled 2018-08-14: qty 2

## 2018-08-14 MED ORDER — FENTANYL CITRATE (PF) 100 MCG/2ML IJ SOLN
INTRAMUSCULAR | Status: AC | PRN
  Administered 2018-08-14: 50 ug via INTRAVENOUS

## 2018-08-14 MED ORDER — MIDAZOLAM HCL 2 MG/2ML IJ SOLN
INTRAMUSCULAR | Status: AC | PRN
  Administered 2018-08-14: 2 mg via INTRAVENOUS

## 2018-08-14 NOTE — H&P (Signed)
Chief Complaint: Patient was seen in consultation today for liver lesion biopsy.  Referring Physician(s): Connor,Chelsea A  Supervising Physician: Jacqulynn Cadet  Patient Status: [facility] - Out-pt  History of Present Illness: Alex [name] is a 68 y.o. male with a past medical history significant for empyema s/p VATS (08/31/17), HTN, HLD, CVA, adenocarcinoma of the sigmoid colon s/p partial colectomy and end colostomy 09/20/17 followed by Dr. Kae Heller who presents today for a liver lesion biopsy. Patient underwent CT abdomen/pelvis with contrast on 05/27/18 as part of routine follow up and an interval 3.4 x 2.2 cm oval area of low density in the tail of the pancreas concerning for neoplasm, mild diffuse hepatic steatosis, interval 4 mm oval area of low density in the superior aspect of the spleen likely representing a small cyst. He underwent follow up MRI abdomen with and without including MRCP on 07/27/18 for further evaluation of possible pancreatic neoplasm which showed several small liver metastases in the right hepatic lobe and a 3.8 cm rim enhancing lesion in the pancreatic tail highly suspicious for metastatic disease. Request has been made to IR for biopsy of these small liver lesions seen on most recent imaging.  Patient reports he feels fine, has no complaints other than a little anxious about the procedure and wanting to get it over. Wife at bedside today. Patient states understanding of requested procedure and would like to proceed.   Past Medical History:  Diagnosis Date   Adenocarcinoma of sigmoid colon ([place]) 2019   with involvement of rectosigmoid junction   Aortic atherosclerosis ([place]) 08/27/2017   Atherosclerosis of arteries    Carotid artery occlusion    Colon polyps    Empyema ([place]) 08/27/2017   High cholesterol    Hypertension    Large bowel obstruction ([place]) 09/13/2017   Pneumonia    Pulmonary nodule, right 08/27/2017   Stroke ([place]) 2010   denies residual on  09/18/2017    Past Surgical History:  Procedure Laterality Date   ANKLE FRACTURE SURGERY Right 1984   DECORTICATION Right 08/30/2017   Procedure: DECORTICATION of right lower lung lobe;  Surgeon: Prescott Gum, Collier Salina, MD;  Location: [place];  Service: Thoracic;  Laterality: Right;   FLEXIBLE SIGMOIDOSCOPY N/A 09/18/2017   Procedure: FLEXIBLE SIGMOIDOSCOPY;  Surgeon: Jerene Bears, MD;  Location: [facility];  Service: Gastroenterology;  Laterality: N/A;   PARTIAL COLECTOMY N/A 09/20/2017   Procedure: OPEN PARTIAL COLECTOMY WITH COLOSTOMY;  Surgeon: Clovis Riley, MD;  Location: [place];  Service: General;  Laterality: N/A;   VIDEO ASSISTED THORACOSCOPY (VATS)/EMPYEMA Right 08/30/2017   Procedure: VIDEO ASSISTED THORACOSCOPY (VATS)/EMPYEMA   ;  Surgeon: Ivin Poot, MD;  Location: [place];  Service: Thoracic;  Laterality: Right;    Allergies: Patient has no known allergies.  Medications: Prior to Admission medications   Medication Sig Start Date End Date Taking? Authorizing Provider  amLODipine (NORVASC) 10 MG tablet Take 10 mg by mouth daily.   Yes [provider]  aspirin 81 MG tablet Take 81 mg by mouth daily. 05/01/18  Yes [provider]  clopidogrel (PLAVIX) 75 MG tablet Take 75 mg by mouth daily.   Yes [provider]  hydrochlorothiazide (HYDRODIURIL) 25 MG tablet Take 25 mg by mouth daily.   Yes [provider]  simvastatin (ZOCOR) 20 MG tablet Take 20 mg by mouth at bedtime.    Yes [provider]  acetaminophen (TYLENOL) 500 MG tablet Take 2 tablets (1,000 mg total) by mouth  every 6 (six) hours as needed. Patient taking differently: Take 1,000 mg by mouth every 6 (six) hours as needed (pain).  09/04/17   Barrett, Lodema Hong, PA-C     Family History  Problem Relation Age of Onset   Heart disease Other    Hypertension Father    Stroke Father    Heart disease Father    Diabetes Sister    Diabetes Brother    Head & neck cancer  Brother     Social History   Socioeconomic History   Marital status: Divorced    Spouse name: Not on file   Number of children: 2   Years of education: Not on file   Highest education level: Not on file  Occupational History   Not on file  Social Needs   Financial resource strain: Not on file   Food insecurity:    Worry: Not on file    Inability: Not on file   Transportation needs:    Medical: Not on file    Non-medical: Not on file  Tobacco Use   Smoking status: Never Smoker   Smokeless tobacco: Never Used  Substance and Sexual Activity   Alcohol use: Not Currently    Alcohol/week: 0.0 standard drinks    Comment: 09/18/2017 "nothing since the 1990s"   Drug use: Yes    Types: Marijuana    Comment: 09/18/2017 "nothing since the 1980s"   Sexual activity: Not Currently  Lifestyle   Physical activity:    Days per week: Not on file    Minutes per session: Not on file   Stress: Not on file  Relationships   Social connections:    Talks on phone: Not on file    Gets together: Not on file    Attends religious service: Not on file    Active member of club or organization: Not on file    Attends meetings of clubs or organizations: Not on file    Relationship status: Not on file  Other Topics Concern   Not on file  Social History Narrative   Not on file     Review of Systems: A 12 point ROS discussed and pertinent positives are indicated in the HPI above.  All other systems are negative.  Review of Systems  Constitutional: Negative for appetite change, chills, fever and unexpected weight change.  Respiratory: Negative for cough and shortness of breath.   Cardiovascular: Negative for chest pain.  Gastrointestinal: Negative for abdominal pain, nausea and vomiting.  Musculoskeletal: Negative for back pain.  Skin: Negative for color change.  Neurological: Negative for dizziness, syncope and headaches.  Psychiatric/Behavioral: Negative for confusion.     Vital Signs: BP 118/81    Pulse (!) 59    Temp 97.8 F (36.6 C) (Oral)    Resp 16    Ht 5\' 6"  (1.676 m)    Wt 208 lb (94.3 kg)    SpO2 96%    BMI 33.57 kg/m   Physical Exam Vitals signs reviewed.  Constitutional:      General: He is not in acute distress.    Appearance: He is obese.  Cardiovascular:     Rate and Rhythm: Normal rate and regular rhythm.  Pulmonary:     Effort: Pulmonary effort is normal.     Breath sounds: Normal breath sounds.  Abdominal:     General: There is no distension.     Palpations: Abdomen is soft.     Tenderness: There is no abdominal tenderness.  Skin:    General: Skin is warm and dry.  Neurological:     Mental Status: He is alert and oriented to person, place, and time.  Psychiatric:        Mood and Affect: Mood normal.        Behavior: Behavior normal.        Thought Content: Thought content normal.        Judgment: Judgment normal.      MD Evaluation Airway: WNL Heart: WNL Abdomen: WNL Chest/ Lungs: WNL ASA  Classification: 3 Mallampati/Airway Score: One   Imaging: Mr Abdomen Wwo Contrast  Result Date: 07/27/2018 CLINICAL DATA:  Pancreatic lesion on recent CT.  Colon carcinoma. EXAM: MRI ABDOMEN WITHOUT AND WITH CONTRAST (INCLUDING MRCP) TECHNIQUE: Multiplanar multisequence MR imaging of the abdomen was performed both before and after the administration of intravenous contrast. Heavily T2-weighted images of the biliary and pancreatic ducts were obtained, and three-dimensional MRCP images were rendered by post processing. CONTRAST:  10 mL Gadavist COMPARISON:  CT on 05/27/2018 FINDINGS: Lower chest: No acute findings. Hepatobiliary: Several small rim enhancing masses are seen in in the right lobe, consistent with liver metastases. Index lesion in the posterior right hepatic lobe measures 1.6 cm on image 43/12. Gallbladder is unremarkable. No evidence of biliary ductal dilatation. Pancreas: A rim enhancing lesion is seen in the pancreatic  tail which measures 3.8 x 2.7 cm. This is new compared to earlier study on 09/14/2017, and is suspicious for metastatic disease, with primary pancreatic neoplasm considered less likely. Spleen:  Within normal limits in size and appearance. Adrenals/Urinary Tract: No masses identified. Tiny cyst in parenchymal scarring seen in the lower pole the left kidney. No evidence of hydronephrosis. Stomach/Bowel: Visualized portion unremarkable. Left lower quadrant colostomy again noted. Vascular/Lymphatic: No pathologically enlarged lymph nodes identified. No abdominal aortic aneurysm. Other:  None. Musculoskeletal:  No suspicious bone lesions identified. IMPRESSION: Several small liver metastases in the right hepatic lobe. 3.8 cm rim enhancing lesion in the pancreatic tail, highly suspicious for metastatic disease, with primary pancreatic neoplasm considered less likely. Electronically Signed   By: Earle Gell M.D.   On: 07/27/2018 11:43   Mr 3d Recon At Scanner  Result Date: 07/27/2018 CLINICAL DATA:  Pancreatic lesion on recent CT.  Colon carcinoma. EXAM: MRI ABDOMEN WITHOUT AND WITH CONTRAST (INCLUDING MRCP) TECHNIQUE: Multiplanar multisequence MR imaging of the abdomen was performed both before and after the administration of intravenous contrast. Heavily T2-weighted images of the biliary and pancreatic ducts were obtained, and three-dimensional MRCP images were rendered by post processing. CONTRAST:  10 mL Gadavist COMPARISON:  CT on 05/27/2018 FINDINGS: Lower chest: No acute findings. Hepatobiliary: Several small rim enhancing masses are seen in in the right lobe, consistent with liver metastases. Index lesion in the posterior right hepatic lobe measures 1.6 cm on image 43/12. Gallbladder is unremarkable. No evidence of biliary ductal dilatation. Pancreas: A rim enhancing lesion is seen in the pancreatic tail which measures 3.8 x 2.7 cm. This is new compared to earlier study on 09/14/2017, and is suspicious for  metastatic disease, with primary pancreatic neoplasm considered less likely. Spleen:  Within normal limits in size and appearance. Adrenals/Urinary Tract: No masses identified. Tiny cyst in parenchymal scarring seen in the lower pole the left kidney. No evidence of hydronephrosis. Stomach/Bowel: Visualized portion unremarkable. Left lower quadrant colostomy again noted. Vascular/Lymphatic: No pathologically enlarged lymph nodes identified. No abdominal aortic aneurysm. Other:  None. Musculoskeletal:  No suspicious bone lesions identified. IMPRESSION: Several  small liver metastases in the right hepatic lobe. 3.8 cm rim enhancing lesion in the pancreatic tail, highly suspicious for metastatic disease, with primary pancreatic neoplasm considered less likely. Electronically Signed   By: Earle Gell M.D.   On: 07/27/2018 11:43    Labs:  CBC: Recent Labs    09/23/17 0706 09/24/17 0704 09/25/17 0346 08/14/18 0615  WBC 5.3 6.9 7.7 9.0  HGB 7.0* 7.3* 7.6* 14.6  HCT 21.7* 23.8* 24.5* 44.6  PLT 235 281 326 258    COAGS: Recent Labs    08/27/17 0708 08/29/17 0956 08/14/18 0615  INR 1.12 1.09 1.0  APTT  --  36 29    BMP: Recent Labs    09/22/17 0448 09/23/17 0706 09/24/17 0704 09/25/17 0346 05/27/18 1540 07/27/18 0947  NA 132* 134* 136 137  --   --   K 3.9 3.6 3.9 4.0  --   --   CL 93* 99* 102 103  --   --   CO2 30 27 26 28   --   --   GLUCOSE 89 91 103* 104*  --   --   BUN 10 6 5* 5*  --   --   CALCIUM 7.9* 7.8* 8.2* 8.4*  --   --   CREATININE 0.62 0.60* 0.62 0.69 1.00 1.10  GFRNONAA >60 >60 >60 >60  --   --   GFRAA >60 >60 >60 >60  --   --     LIVER FUNCTION TESTS: Recent Labs    09/21/17 0459 09/22/17 0448 09/24/17 0704 09/25/17 0346  BILITOT 1.5* 0.7 0.7 0.7  AST 64* 31 20 24   ALT 93* 56 33 30  ALKPHOS 52 41 43 46  PROT 5.6* 4.8* 4.9* 5.3*  ALBUMIN 3.0* 2.4* 2.5* 2.7*    TUMOR MARKERS: No results for input(s): AFPTM, CEA, CA199, CHROMGRNA in the last 8760  hours.  Assessment and Plan:  68 y/o M with history of adenocarcinoma of the sigmoid colon s/p partial colectomy and end colostomy 09/20/17 followed by Dr. Kae Heller. Routine imaging showed pancreatic lesion concerning for metastatic disease as well as several small liver lesions likely representing metastatic disease as well. Request has been made to IR for image guided liver lesion biopsy.  Patient has been NPO since 10 pm last night, he did not take any medications this morning, last dose of Plavix 3/10. Afebrile, WBC 9.0, hgb 14.6, plt 258, INR 1.0.  Risks and benefits of liver lesion biopsy was discussed with the patient and/or patient's family including, but not limited to bleeding, infection, damage to adjacent structures or low yield requiring additional tests.  All of the questions were answered and there is agreement to proceed.  Consent signed and in chart.  Thank you for this interesting consult.  I greatly enjoyed meeting Jonn L Ruhland and look forward to participating in their care.  A copy of this report was sent to the requesting provider on this date.  Electronically Signed: Joaquim Nam, PA-C 08/14/2018, 7:25 AM   I spent a total of  30 Minutes   in face to face in clinical consultation, greater than 50% of which was counseling/coordinating care for liver lesion biopsy.

## 2018-08-14 NOTE — Procedures (Signed)
Interventional Radiology Procedure Note  Procedure: US guided biopsy of liver lesion, 18G cores.   Complications: None  Estimated Blood Loss: None  Recommendations: - Bedrest x 2 hrs - Path is penidng.   Signed,  Criselda Peaches, MD

## 2018-08-14 NOTE — Discharge Instructions (Signed)
Liver Biopsy, Care After °These instructions give you information on caring for yourself after your procedure. Your doctor may also give you more specific instructions. Call your doctor if you have any problems or questions after your procedure. °What can I expect after the procedure? °After the procedure, it is common to have: °· Pain and soreness where the biopsy was done. °· Bruising around the area where the biopsy was done. °· Sleepiness and be tired for a few days. °Follow these instructions at home: °Medicines °· Take over-the-counter and prescription medicines only as told by your doctor. °· If you were prescribed an antibiotic medicine, take it as told by your doctor. Do not stop taking the antibiotic even if you start to feel better. °· Do not take medicines such as aspirin and ibuprofen. These medicines can thin your blood. Do not take these medicines unless your doctor tells you to take them. °· If you are taking prescription pain medicine, take actions to prevent or treat constipation. Your doctor may recommend that you: °? Drink enough fluid to keep your pee (urine) clear or pale yellow. °? Take over-the-counter or prescription medicines. °? Eat foods that are high in fiber, such as fresh fruits and vegetables, whole grains, and beans. °? Limit foods that are high in fat and processed sugars, such as fried and sweet foods. °Caring for your cut °· Follow instructions from your doctor about how to take care of your cuts from surgery (incisions). Make sure you: °? Wash your hands with soap and water before you change your bandage (dressing). If you cannot use soap and water, use hand sanitizer. °? Change your bandage as told by your doctor. °? Leave stitches (sutures), skin glue, or skin tape (adhesive) strips in place. They may need to stay in place for 2 weeks or longer. If tape strips get loose and curl up, you may trim the loose edges. Do not remove tape strips completely unless your doctor says it is  okay. °· Check your cuts every day for signs of infection. Check for: °? Redness, swelling, or more pain. °? Fluid or blood. °? Pus or a bad smell. °? Warmth. °· Do not take baths, swim, or use a hot tub until your doctor says it is okay to do so. °Activity ° °· Rest at home for 1-2 days or as told by your doctor. °? Avoid sitting for a long time without moving. Get up to take short walks every 1-2 hours. °· Return to your normal activities as told by your doctor. Ask what activities are safe for you. °· Do not do these things in the first 24 hours: °? Drive. °? Use machinery. °? Take a bath or shower. °· Do not lift more than 10 pounds (4.5 kg) or play contact sports for the first 2 weeks. °General instructions ° °· Do not drink alcohol in the first week after the procedure. °· Have someone stay with you for at least 24 hours after the procedure. °· Get your test results. Ask your doctor or the department that is doing the test: °? When will my results be ready? °? How will I get my results? °? What are my treatment options? °? What other tests do I need? °? What are my next steps? °· Keep all follow-up visits as told by your doctor. This is important. °Contact a doctor if: °· A cut bleeds and leaves more than just a small spot of blood. °· A cut is red, puffs up (  swells), or hurts more than before. °· Fluid or something else comes from a cut. °· A cut smells bad. °· You have a fever or chills. °Get help right away if: °· You have swelling, bloating, or pain in your belly (abdomen). °· You get dizzy or faint. °· You have a rash. °· You feel sick to your stomach (nauseous) or throw up (vomit). °· You have trouble breathing, feel short of breath, or feel faint. °· Your chest hurts. °· You have problems talking or seeing. °· You have trouble with your balance or moving your arms or legs. °Summary °· After the procedure, it is common to have pain, soreness, bruising, and tiredness. °· Your doctor will tell you how to  take care of yourself at home. Change your bandage, take your medicines, and limit your activities as told by your doctor. °· Call your doctor if you have symptoms of infection. Get help right away if your belly swells, your cut bleeds a lot, or you have trouble talking or breathing. °This information is not intended to replace advice given to you by your health care provider. Make sure you discuss any questions you have with your health care provider. °Document Released: 02/22/2008 Document Revised: 05/25/2017 Document Reviewed: 05/25/2017 °Elsevier Interactive Patient Education © 2019 [employer] °Moderate Conscious Sedation, Adult, Care After °These instructions provide you with information about caring for yourself after your procedure. Your health care provider may also give you more specific instructions. Your treatment has been planned according to current medical practices, but problems sometimes occur. Call your health care provider if you have any problems or questions after your procedure. °What can I expect after the procedure? °After your procedure, it is common: °· To feel sleepy for several hours. °· To feel clumsy and have poor balance for several hours. °· To have poor judgment for several hours. °· To vomit if you eat too soon. °Follow these instructions at home: °For at least 24 hours after the procedure: ° °· Do not: °? Participate in activities where you could fall or become injured. °? Drive. °? Use heavy machinery. °? Drink alcohol. °? Take sleeping pills or medicines that cause drowsiness. °? Make important decisions or sign legal documents. °? Take care of children on your own. °· Rest. °Eating and drinking °· Follow the diet recommended by your health care provider. °· If you vomit: °? Drink water, juice, or soup when you can drink without vomiting. °? Make sure you have little or no nausea before eating solid foods. °General instructions °· Have a responsible adult stay with you until  you are awake and alert. °· Take over-the-counter and prescription medicines only as told by your health care provider. °· If you smoke, do not smoke without supervision. °· Keep all follow-up visits as told by your health care provider. This is important. °Contact a health care provider if: °· You keep feeling nauseous or you keep vomiting. °· You feel light-headed. °· You develop a rash. °· You have a fever. °Get help right away if: °· You have trouble breathing. °This information is not intended to replace advice given to you by your health care provider. Make sure you discuss any questions you have with your health care provider. °Document Released: 03/05/2013 Document Revised: 10/18/2015 Document Reviewed: 09/04/2015 °Elsevier Interactive Patient Education © 2019 [employer] ° °

## 2018-08-15 ENCOUNTER — Telehealth: Payer: Self-pay | Admitting: Nurse Practitioner

## 2018-08-15 NOTE — Telephone Encounter (Signed)
R/s appt per 3/19 sch message - left message for patient with new appt time

## 2018-08-16 ENCOUNTER — Inpatient Hospital Stay: Payer: Self-pay | Attending: Nurse Practitioner | Admitting: Oncology

## 2018-08-16 ENCOUNTER — Telehealth: Payer: Self-pay

## 2018-08-16 DIAGNOSIS — Z933 Colostomy status: Secondary | ICD-10-CM | POA: Insufficient documentation

## 2018-08-16 DIAGNOSIS — R911 Solitary pulmonary nodule: Secondary | ICD-10-CM | POA: Insufficient documentation

## 2018-08-16 DIAGNOSIS — D124 Benign neoplasm of descending colon: Secondary | ICD-10-CM | POA: Insufficient documentation

## 2018-08-16 DIAGNOSIS — C7889 Secondary malignant neoplasm of other digestive organs: Secondary | ICD-10-CM | POA: Insufficient documentation

## 2018-08-16 DIAGNOSIS — Z8701 Personal history of pneumonia (recurrent): Secondary | ICD-10-CM | POA: Insufficient documentation

## 2018-08-16 DIAGNOSIS — D123 Benign neoplasm of transverse colon: Secondary | ICD-10-CM | POA: Insufficient documentation

## 2018-08-16 DIAGNOSIS — Z9049 Acquired absence of other specified parts of digestive tract: Secondary | ICD-10-CM | POA: Insufficient documentation

## 2018-08-16 DIAGNOSIS — Z86010 Personal history of colonic polyps: Secondary | ICD-10-CM | POA: Insufficient documentation

## 2018-08-16 DIAGNOSIS — C787 Secondary malignant neoplasm of liver and intrahepatic bile duct: Secondary | ICD-10-CM | POA: Insufficient documentation

## 2018-08-16 DIAGNOSIS — Z8673 Personal history of transient ischemic attack (TIA), and cerebral infarction without residual deficits: Secondary | ICD-10-CM | POA: Insufficient documentation

## 2018-08-16 DIAGNOSIS — D122 Benign neoplasm of ascending colon: Secondary | ICD-10-CM | POA: Insufficient documentation

## 2018-08-16 DIAGNOSIS — C19 Malignant neoplasm of rectosigmoid junction: Secondary | ICD-10-CM | POA: Insufficient documentation

## 2018-08-16 NOTE — Telephone Encounter (Signed)
  Oncology Nurse Navigator Documentation   Called patient to reschedule missed appointment from today. Patient agreed to come in on 3/23/ @ 9:30 AM. Patient understands that he should come in 15 minutes early to register.

## 2018-08-19 ENCOUNTER — Inpatient Hospital Stay: Payer: Self-pay

## 2018-08-19 ENCOUNTER — Other Ambulatory Visit: Payer: Self-pay

## 2018-08-19 ENCOUNTER — Inpatient Hospital Stay (HOSPITAL_BASED_OUTPATIENT_CLINIC_OR_DEPARTMENT_OTHER): Payer: Self-pay | Admitting: Oncology

## 2018-08-19 ENCOUNTER — Telehealth: Payer: Self-pay | Admitting: Oncology

## 2018-08-19 VITALS — BP 137/67 | HR 58 | Temp 97.8°F | Resp 17 | Ht 66.0 in | Wt 208.2 lb

## 2018-08-19 DIAGNOSIS — D122 Benign neoplasm of ascending colon: Secondary | ICD-10-CM

## 2018-08-19 DIAGNOSIS — D123 Benign neoplasm of transverse colon: Secondary | ICD-10-CM

## 2018-08-19 DIAGNOSIS — R911 Solitary pulmonary nodule: Secondary | ICD-10-CM

## 2018-08-19 DIAGNOSIS — C7889 Secondary malignant neoplasm of other digestive organs: Secondary | ICD-10-CM

## 2018-08-19 DIAGNOSIS — Z8701 Personal history of pneumonia (recurrent): Secondary | ICD-10-CM

## 2018-08-19 DIAGNOSIS — Z8673 Personal history of transient ischemic attack (TIA), and cerebral infarction without residual deficits: Secondary | ICD-10-CM

## 2018-08-19 DIAGNOSIS — Z933 Colostomy status: Secondary | ICD-10-CM

## 2018-08-19 DIAGNOSIS — K8689 Other specified diseases of pancreas: Secondary | ICD-10-CM

## 2018-08-19 DIAGNOSIS — C787 Secondary malignant neoplasm of liver and intrahepatic bile duct: Secondary | ICD-10-CM

## 2018-08-19 DIAGNOSIS — Z9049 Acquired absence of other specified parts of digestive tract: Secondary | ICD-10-CM

## 2018-08-19 DIAGNOSIS — Z86010 Personal history of colonic polyps: Secondary | ICD-10-CM

## 2018-08-19 DIAGNOSIS — D124 Benign neoplasm of descending colon: Secondary | ICD-10-CM

## 2018-08-19 DIAGNOSIS — C19 Malignant neoplasm of rectosigmoid junction: Secondary | ICD-10-CM

## 2018-08-19 LAB — CBC WITH DIFFERENTIAL (CANCER CENTER ONLY)
ABS IMMATURE GRANULOCYTES: 0.03 10*3/uL (ref 0.00–0.07)
Basophils Absolute: 0.1 10*3/uL (ref 0.0–0.1)
Basophils Relative: 1 %
Eosinophils Absolute: 0.2 10*3/uL (ref 0.0–0.5)
Eosinophils Relative: 2 %
HCT: 50.7 % (ref 39.0–52.0)
Hemoglobin: 16.3 g/dL (ref 13.0–17.0)
IMMATURE GRANULOCYTES: 0 %
Lymphocytes Relative: 15 %
Lymphs Abs: 1.3 10*3/uL (ref 0.7–4.0)
MCH: 28.7 pg (ref 26.0–34.0)
MCHC: 32.1 g/dL (ref 30.0–36.0)
MCV: 89.3 fL (ref 80.0–100.0)
MONO ABS: 0.6 10*3/uL (ref 0.1–1.0)
Monocytes Relative: 7 %
Neutro Abs: 6.5 10*3/uL (ref 1.7–7.7)
Neutrophils Relative %: 75 %
PLATELETS: 278 10*3/uL (ref 150–400)
RBC: 5.68 MIL/uL (ref 4.22–5.81)
RDW: 11.9 % (ref 11.5–15.5)
WBC Count: 8.8 10*3/uL (ref 4.0–10.5)
nRBC: 0 % (ref 0.0–0.2)

## 2018-08-19 LAB — CEA (IN HOUSE-CHCC): CEA (CHCC-In House): 1.19 ng/mL (ref 0.00–5.00)

## 2018-08-19 LAB — CMP (CANCER CENTER ONLY)
ALT: 22 U/L (ref 0–44)
AST: 20 U/L (ref 15–41)
Albumin: 4.3 g/dL (ref 3.5–5.0)
Alkaline Phosphatase: 59 U/L (ref 38–126)
Anion gap: 12 (ref 5–15)
BUN: 16 mg/dL (ref 8–23)
CHLORIDE: 100 mmol/L (ref 98–111)
CO2: 28 mmol/L (ref 22–32)
Calcium: 9.7 mg/dL (ref 8.9–10.3)
Creatinine: 1.07 mg/dL (ref 0.61–1.24)
GFR, Est AFR Am: >60 mL/min (ref >60)
Glucose, Bld: 117 mg/dL — ABNORMAL HIGH (ref 70–99)
Potassium: 4.3 mmol/L (ref 3.5–5.1)
Sodium: 140 mmol/L (ref 135–145)
Total Bilirubin: 0.9 mg/dL (ref 0.3–1.2)
Total Protein: 8.6 g/dL — ABNORMAL HIGH (ref 6.5–8.1)

## 2018-08-19 NOTE — Telephone Encounter (Signed)
Gave avs and calendar ° °

## 2018-08-19 NOTE — Progress Notes (Signed)
[place] OFFICE PROGRESS NOTE   Diagnosis: Colon cancer  INTERVAL HISTORY:   Alex [name] was last seen at the Cancer center in December 2019.  He appeared well.  He saw Dr. Kae Heller prior to planned reversal of the colostomy.  A CT of the abdomen and pelvis on 05/27/2018 a mass was noted in the tail the pancreas.  A 4 mm low-density area was noted in the spleen.  The pancreas and spleen lesions are new compared to a CT from April 2019.  He was referred for an MRI of the abdomen on 07/27/2018.  This revealed small rim-enhancing masses in the right liver.  A rim-enhancing mass was noted in the pancreas tail measuring 3.8 x 2.7 cm.  The spleen appeared normal.   He underwent ultrasound-guided biopsy of a right liver lesion on 08/14/2018.  Multiple core biopsies were obtained.  The pathology ([id number]) revealed metastatic adenocarcinoma.  The tumor cells are positive for cytokeratin 7 and weak CDX-2.  The cells are negative for cytokeratin 20.  The pathology findings are consistent with a primary pancreas carcinoma.   Alex [name] reports feeling well.   Review of systems: Positives- itching surrounding the colostomy site A complete review of systems was otherwise negative  Objective:  Vital signs in last 24 hours:  Blood pressure 137/67, pulse (!) 58, temperature 97.8 F (36.6 C), temperature source Oral, resp. rate 17, height '5\' 6"'  (1.676 m), weight 208 lb 3.2 oz (94.4 kg), SpO2 97 %.    Physical examination-not performed today  Lab Results:  Lab Results  Component Value Date   WBC 9.0 08/14/2018   HGB 14.6 08/14/2018   HCT 44.6 08/14/2018   MCV 88.8 08/14/2018   PLT 258 08/14/2018   NEUTROABS 5.2 09/15/2017    CMP  Lab Results  Component Value Date   NA 137 09/25/2017   K 4.0 09/25/2017   CL 103 09/25/2017   CO2 28 09/25/2017   GLUCOSE 104 (H) 09/25/2017   BUN 5 (L) 09/25/2017   CREATININE 1.10 07/27/2018   CALCIUM 8.4 (L) 09/25/2017   PROT 5.3  (L) 09/25/2017   ALBUMIN 2.7 (L) 09/25/2017   AST 24 09/25/2017   ALT 30 09/25/2017   ALKPHOS 46 09/25/2017   BILITOT 0.7 09/25/2017   GFRNONAA >60 09/25/2017   GFRAA >60 09/25/2017    Lab Results  Component Value Date   CEA1 <1.00 04/09/2018     Medications: I have reviewed the patient's current medications.   Assessment/Plan: 1. 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 Well-differentiated adenocarcinoma, 0/13 lymph nodes positive, MSI-stable, no loss of mismatch repair protein expression ? Tumor involving the rectosigmoid junction ? Sigmoidoscopy 09/18/2017- distal sigmoid colon tumor beginning between 15 and 20 cm from the dentate line, completely obstructing ? CT abdomen/pelvis 09/14/2017-sigmoid thickening no evidence of metastatic disease ? 2 mm right upper lobe nodule on CT 08/04/2017 ? Colonoscopy 02/07/2018- polyps removed from the cecum, ascending colon, transverse colon, descending colon, rectosigmoid colon and rectum (tubular adenomas)  2. Right lung pneumonia/empyema March 2019              Right VATS, drainage of empyema, and decortication of the right lower lobe 08/31/2017  3.   CVA in 2010  4.  Rectal and active sigmoid polyps noted on flexible sigmoidoscopy 09/18/2017  5.  Pancreas tail mass/right liver lesions and 4 mm splenic lesion on CT abdomen/pelvis 05/27/2018  MRI  abdomen 07/27/2018- right liver lesions, rim-enhancing lesion in the pancreas tail  Ultrasound-guided biopsy of a right liver lesion 08/14/2018-metastatic adenocarcinoma, cytokeratin 7+, CDX-2 weak positive, cytokeratin 20 negative, consistent with metastatic pancreas cancer    Disposition: Alex [name] was diagnosed with early stage colon cancer approximately 1 year ago.  He was found to have liver metastases and a pancreas mass on a CT in December 2019.  An MRI of the abdomen confirmed these findings.  A biopsy of  a right liver lesion 08/14/2018 is consistent with metastatic pancreas cancer.  A diagnosis of metastatic pancreas cancer appears more likely than recurrent colon cancer.  I discussed the apparent diagnosis of metastatic pancreas cancer and treatment options with Alex [name].  He appears asymptomatic.  The pancreas mass was an incidental finding on a CT obtained prior to colostomy reversal.  I reviewed the CT and MRI images with Alex [name]  I explained no therapy will be curative.  Treatment will consist of systemic chemotherapy.  We will request Foundation 1 testing on the liver biopsy pathology.  We will check a CA 19-9 and CEA today.  Alex [name] will return for an office visit and further discussion in approximately 3 weeks.  We will try to wait until after the peak of the coronavirus pandemic to begin systemic therapy. We discussed Port-A-Cath placement.  We will asked Dr. Kae Heller to plan for placement of a Port-A-Cath after the next office visit here.   Betsy Coder, MD  08/19/2018  9:50 AM

## 2018-08-20 LAB — CANCER ANTIGEN 19-9: CA 19-9: 157 U/mL — ABNORMAL HIGH (ref 0–35)

## 2018-08-22 ENCOUNTER — Telehealth: Payer: Self-pay | Admitting: Cardiology

## 2018-08-22 NOTE — Telephone Encounter (Signed)
I called the patient regarding his upcoming preop clearance appt. He says that he has no cardiac history and is having no chest pain, shortness of breath or swelling. He does not know why his oncologist requested this appt. He does take Plavix for a prior stroke which we cannot give permission to hold. We discussed the COVID 19 pandemic restrictions and that no office visits or elective testing are being done at this time. I explained that we could do a virtual visit. He is going to call his oncologist office and see what the rationale for this visit is and get back to me.   Daune Perch, AGNP-C [facility] 08/22/2018  9:44 AM

## 2018-08-26 ENCOUNTER — Ambulatory Visit: Payer: Self-pay | Admitting: Cardiology

## 2018-08-26 ENCOUNTER — Encounter: Payer: Self-pay | Admitting: Genetic Counselor

## 2018-08-26 ENCOUNTER — Telehealth: Payer: Self-pay | Admitting: Cardiology

## 2018-08-26 DIAGNOSIS — Z1379 Encounter for other screening for genetic and chromosomal anomalies: Secondary | ICD-10-CM | POA: Insufficient documentation

## 2018-08-26 NOTE — Telephone Encounter (Signed)
I called and spoke to Dr. Kae Heller regarding cardiac clearance request and the patient is no longer qualified for colostomy reversal since he has metastatic disease. He does not need a cardiology new patient office visit for clearance per Dr. Kae Heller.   Daune Perch, AGNP-C [facility] 08/26/2018  10:18 AM

## 2018-09-02 ENCOUNTER — Ambulatory Visit: Payer: Self-pay | Admitting: Surgery

## 2018-09-04 ENCOUNTER — Encounter (HOSPITAL_COMMUNITY): Payer: Self-pay | Admitting: Oncology

## 2018-09-10 ENCOUNTER — Telehealth: Payer: Self-pay | Admitting: *Deleted

## 2018-09-10 ENCOUNTER — Telehealth: Payer: Self-pay | Admitting: Nurse Practitioner

## 2018-09-10 ENCOUNTER — Encounter: Payer: Self-pay | Admitting: Nurse Practitioner

## 2018-09-10 ENCOUNTER — Other Ambulatory Visit: Payer: Self-pay

## 2018-09-10 ENCOUNTER — Inpatient Hospital Stay: Payer: Self-pay | Attending: Nurse Practitioner | Admitting: Nurse Practitioner

## 2018-09-10 VITALS — BP 118/66 | HR 61 | Temp 97.8°F | Resp 19 | Ht 66.0 in | Wt 207.0 lb

## 2018-09-10 DIAGNOSIS — C7889 Secondary malignant neoplasm of other digestive organs: Secondary | ICD-10-CM | POA: Insufficient documentation

## 2018-09-10 DIAGNOSIS — Z86010 Personal history of colonic polyps: Secondary | ICD-10-CM | POA: Insufficient documentation

## 2018-09-10 DIAGNOSIS — J869 Pyothorax without fistula: Secondary | ICD-10-CM | POA: Insufficient documentation

## 2018-09-10 DIAGNOSIS — J189 Pneumonia, unspecified organism: Secondary | ICD-10-CM | POA: Insufficient documentation

## 2018-09-10 DIAGNOSIS — Z8673 Personal history of transient ischemic attack (TIA), and cerebral infarction without residual deficits: Secondary | ICD-10-CM | POA: Insufficient documentation

## 2018-09-10 DIAGNOSIS — C252 Malignant neoplasm of tail of pancreas: Secondary | ICD-10-CM

## 2018-09-10 DIAGNOSIS — Z933 Colostomy status: Secondary | ICD-10-CM | POA: Insufficient documentation

## 2018-09-10 DIAGNOSIS — Z85038 Personal history of other malignant neoplasm of large intestine: Secondary | ICD-10-CM | POA: Insufficient documentation

## 2018-09-10 NOTE — Progress Notes (Addendum)
  [place] OFFICE PROGRESS NOTE   Diagnosis: Colon cancer, pancreas cancer  INTERVAL HISTORY:   Alex [name] returns as scheduled.  He reports feeling well.  He has a good appetite.  No abdominal pain.  Colostomy functioning normally.  No constipation or diarrhea.  He denies fever, cough and shortness of breath.  Objective:  Vital signs in last 24 hours:  Blood pressure 118/66, pulse 61, temperature 97.8 F (36.6 C), temperature source Oral, resp. rate 19, height '5\' 6"'$  (1.676 m), weight 207 lb (93.9 kg), SpO2 99 %.    HEENT: No thrush or ulcers. Resp: Respirations even and unlabored. GI: Left lower quadrant colostomy. Vascular: No leg edema. Neuro: Alert and oriented.    Lab Results:  Lab Results  Component Value Date   WBC 8.8 08/19/2018   HGB 16.3 08/19/2018   HCT 50.7 08/19/2018   MCV 89.3 08/19/2018   PLT 278 08/19/2018   NEUTROABS 6.5 08/19/2018    Imaging:  No results found.  Medications: I have reviewed the patient's current medications.  Assessment/Plan: 1. 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 Well-differentiated adenocarcinoma, 0/13 lymph nodes positive, MSI-stable, no loss of mismatch repair protein expression ? Tumor involving the rectosigmoid junction ? Sigmoidoscopy 09/18/2017- distal sigmoid colon tumor beginning between 15 and 20 cm from the dentate line, completely obstructing ? CT abdomen/pelvis 09/14/2017-sigmoid thickening no evidence of metastatic disease ? 2 mm right upper lobe nodule on CT 08/04/2017 ? Colonoscopy 02/07/2018- polyps removed from the cecum, ascending colon, transverse colon, descending colon, rectosigmoid colon and rectum (tubular adenomas)  2. Right lung pneumonia/empyema March 2019  Right VATS, drainage of empyema, and decortication of the right lower lobe 08/31/2017  3.CVA in 2010  4.Rectal and active  sigmoid polyps noted on flexible sigmoidoscopy 09/18/2017  5.  Pancreas tail mass/right liver lesions and 4 mm splenic lesion on CT abdomen/pelvis 05/27/2018  MRI abdomen 07/27/2018- right liver lesions, rim-enhancing lesion in the pancreas tail  Ultrasound-guided biopsy of a right liver lesion 08/14/2018-metastatic adenocarcinoma, cytokeratin 7+, CDX-2 weak positive, cytokeratin 20 negative, consistent with metastatic pancreas cancer; MSI stable; tumor mutational burden 0;    Disposition: Alex [name] appears stable.  We reviewed the diagnosis of metastatic pancreas cancer.  He understands that no therapy will be curative.  We had preliminary discussion regarding chemotherapy. We specifically discussed gemcitabine/Abraxane and FOLFIRINOX.  At present he appears asymptomatic.  We decided to continue to follow with observation.  He will undergo restaging CTs in approximately 3 weeks.  He will return for a follow-up visit on 10/03/2018 to review those results and further discuss chemotherapy.  He will contact the office in the interim with any problems.  Patient seen with Dr. Benay Spice.  25 minutes were spent face-to-face at today's visit with the majority of that time involved in counseling/coordination of care.    Ned Card ANP/GNP-BC   09/10/2018  10:16 AM This was a shared visit with Ned Card.  Alex [name] remains asymptomatic from the pancreas cancer.  He is scheduled for Port-A-Cath placement next week. We decided to obtain restaging CTs to assess the pace of disease progression.  He will return after the CTs to decide on whether to initiate systemic therapy.  He understands no therapy will be curative.  We discussed chemotherapy options.  Julieanne Manson, MD

## 2018-09-10 NOTE — Telephone Encounter (Signed)
Scheduled  appt per 4/14 los.  [facility] will contact patient about scan.

## 2018-09-10 NOTE — Telephone Encounter (Signed)
Daughter called to request MD or NP call her regarding the appointment today and what was discussed and planned for him. He has difficulty expressing himself to her. Informed her someone will call her on Wednesday with plans.

## 2018-09-11 ENCOUNTER — Telehealth: Payer: Self-pay | Admitting: Nurse Practitioner

## 2018-09-11 NOTE — Telephone Encounter (Signed)
I contacted Alex [name] daughter, Linwood Dibbles, to discuss yesterday's appointment.  I reviewed the recommendation for observation with plans for restaging CT scans in the next several weeks to be followed by an office visit.  She agrees with this plan.

## 2018-09-12 NOTE — Pre-Procedure Instructions (Signed)
Alex [name]  09/12/2018      [place], [place] - [address] [place] [address] Phone: [phone] Fax: [phone]    Your procedure is scheduled on Friday, 09/20/2018.  Report to [facility] Admitting at [place].M.  Call this number if you have problems the morning of surgery:  [phone]   Remember:  Do not eat or drink after midnight.     Take these medicines the morning of surgery with A SIP OF WATER: Acetaminophen (Tylenol) - if needed Amlodipine (Norvasc) Atenolol (Tenormin)  7 days prior to surgery STOP taking any Aspirin (unless otherwise instructed by your surgeon), Aleve, Naproxen, Ibuprofen, Motrin, Advil, Goody's, BC's, all herbal medications, fish oil, and all vitamins.  Follow your surgeon's instructions on when to stop Aspirin and Clopidogrel (Plavix).  If no instructions were given by your surgeon then you will need to call the office to get those instructions.      Do not wear jewelry.  Do not wear lotions, powders, or colognes, or deodorant.  Men may shave face and neck.  Do not bring valuables to the hospital.  [facility] is not responsible for any belongings or valuables.  Contacts, eyeglasses, hearing aids, dentures or bridgework may not be worn into surgery.  Leave your suitcase in the car.  After surgery it may be brought to your room.  For patients admitted to the hospital, discharge time will be determined by your treatment team.  Patients discharged the day of surgery will not be allowed to drive home.    [facility] Special instructions:   [place]- Preparing For Surgery  Before surgery, you can play an important role. Because skin is not sterile, your skin needs to be as free of germs as possible. You can reduce the number of germs on your skin by washing with CHG (chlorahexidine gluconate) Soap before surgery.  CHG is an antiseptic cleaner which kills germs and bonds with the  skin to continue killing germs even after washing.    Oral Hygiene is also important to reduce your risk of infection.  Remember - BRUSH YOUR TEETH THE MORNING OF SURGERY WITH YOUR REGULAR TOOTHPASTE  Please do not use if you have an allergy to CHG or antibacterial soaps. If your skin becomes reddened/irritated stop using the CHG.  Do not shave (including legs and underarms) for at least 48 hours prior to first CHG shower. It is OK to shave your face.  Please follow these instructions carefully.   1. Shower the [employer] BEFORE SURGERY (Thurs) and the MORNING OF SURGERY (Fri) with CHG.   2. If you chose to wash your hair, wash your hair first as usual with your normal shampoo.  3. After you shampoo, rinse your hair and body thoroughly to remove the shampoo.  4. Use CHG as you would any other liquid soap. You can apply CHG directly to the skin and wash gently with a scrungie or a clean washcloth.   5. Apply the CHG Soap to your body ONLY FROM THE NECK DOWN.  Do not use on open wounds or open sores. Avoid contact with your eyes, ears, mouth and genitals (private parts). Wash Face and genitals (private parts)  with your normal soap.  6. Wash thoroughly, paying special attention to the area where your surgery will be performed.  7. Thoroughly rinse your body with warm water from the neck down.  8. DO NOT  shower/wash with your normal soap after using and rinsing off the CHG Soap.  9. Pat yourself dry with a CLEAN TOWEL.  10. Wear CLEAN PAJAMAS to bed the night before surgery, wear comfortable clothes the morning of surgery  11. Place CLEAN SHEETS on your bed the night of your first shower and DO NOT SLEEP WITH PETS.    Day of Surgery: Shower as stated above. Do not apply any deodorants/lotions.  Please wear clean clothes to the hospital/surgery center.   Remember to brush your teeth WITH YOUR REGULAR TOOTHPASTE.   Please read over the following fact sheets that you were given.  Patient  informed of the hospital visitation restriction policy that is currently in effect.

## 2018-09-12 NOTE — Pre-Procedure Instructions (Signed)
Inocente L Whitacre  09/12/2018      [place], [place] - [address] [place] [address] [address] [place] [address] Phone: [phone] Fax: [phone]    Your procedure is scheduled on 09/20/2018.  Report to [facility] Admitting at [place].M.  Call this number if you have problems the morning of surgery:  [phone]   Remember:  Do not eat or drink after midnight.     Take these medicines the morning of surgery with A SIP OF WATER: Acetaminophen (Tylenol) - if needed Amlodipine (Norvasc) Atenolol (Tenormin)  7 days prior to surgery STOP taking any Aspirin (unless otherwise instructed by your surgeon), Aleve, Naproxen, Ibuprofen, Motrin, Advil, Goody's, BC's, all herbal medications, fish oil, and all vitamins.  Follow your surgeon's instructions on when to stop Aspirin and Clopidogrel (Plavix).  If no instructions were given by your surgeon then you will need to call the office to get those instructions.        Do not wear jewelry.  Do not wear lotions, powders, or colognes, or deodorant.  Do not shave 48 hours prior to surgery.  Men may shave face and neck.  Do not bring valuables to the hospital.  [facility] is not responsible for any belongings or valuables.  Contacts, eyeglasses, hearing aids, dentures or bridgework may not be worn into surgery.  Leave your suitcase in the car.  After surgery it may be brought to your room.  For patients admitted to the hospital, discharge time will be determined by your treatment team.  Patients discharged the day of surgery will not be allowed to drive home.   Name and phone number of your driver:  Special instructions:   [place]- Preparing For Surgery  Before surgery, you can play an important role. Because skin is not sterile, your skin needs to be as free of germs as possible. You can reduce the number of germs on your skin by washing with CHG (chlorahexidine gluconate) Soap before  surgery.  CHG is an antiseptic cleaner which kills germs and bonds with the skin to continue killing germs even after washing.    Oral Hygiene is also important to reduce your risk of infection.  Remember - BRUSH YOUR TEETH THE MORNING OF SURGERY WITH YOUR REGULAR TOOTHPASTE  Please do not use if you have an allergy to CHG or antibacterial soaps. If your skin becomes reddened/irritated stop using the CHG.  Do not shave (including legs and underarms) for at least 48 hours prior to first CHG shower. It is OK to shave your face.  Please follow these instructions carefully.   1. Shower the NIGHT BEFORE SURGERY and the MORNING OF SURGERY with CHG.   2. If you chose to wash your hair, wash your hair first as usual with your normal shampoo.  3. After you shampoo, rinse your hair and body thoroughly to remove the shampoo.  4. Use CHG as you would any other liquid soap. You can apply CHG directly to the skin and wash gently with a scrungie or a clean washcloth.   5. Apply the CHG Soap to your body ONLY FROM THE NECK DOWN.  Do not use on open wounds or open sores. Avoid contact with your eyes, ears, mouth and genitals (private parts). Wash Face and genitals (private parts)  with your normal soap.  6. Wash thoroughly, paying special attention to the area where your surgery will be performed.  7. Thoroughly rinse your body with warm water from the neck down.  8. DO NOT shower/wash with your normal soap after using and rinsing off the CHG Soap.  9. Pat yourself dry with a CLEAN TOWEL.  10. Wear CLEAN PAJAMAS to bed the night before surgery, wear comfortable clothes the morning of surgery  11. Place CLEAN SHEETS on your bed the night of your first shower and DO NOT SLEEP WITH PETS.    Day of Surgery: Shower as stated above. Do not apply any deodorants/lotions.  Please wear clean clothes to the hospital/surgery center.   Remember to brush your teeth WITH YOUR REGULAR TOOTHPASTE.    Please  read over the following fact sheets that you were given. Pain Booklet, Coughing and Deep Breathing, MRSA Information and Surgical Site Infection Prevention

## 2018-09-13 ENCOUNTER — Other Ambulatory Visit: Payer: Self-pay

## 2018-09-13 ENCOUNTER — Encounter (HOSPITAL_COMMUNITY): Admission: RE | Admit: 2018-09-13 | Payer: Self-pay | Source: Ambulatory Visit

## 2018-09-13 ENCOUNTER — Encounter (HOSPITAL_BASED_OUTPATIENT_CLINIC_OR_DEPARTMENT_OTHER): Payer: Self-pay

## 2018-09-13 DIAGNOSIS — Z7982 Long term (current) use of aspirin: Secondary | ICD-10-CM | POA: Insufficient documentation

## 2018-09-13 DIAGNOSIS — Z8673 Personal history of transient ischemic attack (TIA), and cerebral infarction without residual deficits: Secondary | ICD-10-CM | POA: Insufficient documentation

## 2018-09-13 DIAGNOSIS — C259 Malignant neoplasm of pancreas, unspecified: Secondary | ICD-10-CM | POA: Insufficient documentation

## 2018-09-13 DIAGNOSIS — I10 Essential (primary) hypertension: Secondary | ICD-10-CM | POA: Insufficient documentation

## 2018-09-13 DIAGNOSIS — Z79899 Other long term (current) drug therapy: Secondary | ICD-10-CM | POA: Insufficient documentation

## 2018-09-13 DIAGNOSIS — E7800 Pure hypercholesterolemia, unspecified: Secondary | ICD-10-CM | POA: Insufficient documentation

## 2018-09-13 DIAGNOSIS — I2510 Atherosclerotic heart disease of native coronary artery without angina pectoris: Secondary | ICD-10-CM | POA: Insufficient documentation

## 2018-09-13 DIAGNOSIS — Z01812 Encounter for preprocedural laboratory examination: Secondary | ICD-10-CM | POA: Insufficient documentation

## 2018-09-13 DIAGNOSIS — I700 Atherosclerosis of aorta: Secondary | ICD-10-CM | POA: Insufficient documentation

## 2018-09-13 LAB — BASIC METABOLIC PANEL
Anion gap: 13 (ref 5–15)
BUN: 16 mg/dL (ref 8–23)
CO2: 27 mmol/L (ref 22–32)
Calcium: 9.2 mg/dL (ref 8.9–10.3)
Chloride: 98 mmol/L (ref 98–111)
Creatinine, Ser: 1.04 mg/dL (ref 0.61–1.24)
GFR calc Af Amer: >60 mL/min (ref >60)
GFR calc non Af Amer: >60 mL/min (ref >60)
Glucose, Bld: 131 mg/dL — ABNORMAL HIGH (ref 70–99)
Potassium: 3.7 mmol/L (ref 3.5–5.1)
Sodium: 138 mmol/L (ref 135–145)

## 2018-09-19 NOTE — Pre-Procedure Instructions (Signed)
UTR pt by phone to re screen for s/s of Covid-19.

## 2018-09-20 ENCOUNTER — Ambulatory Visit (HOSPITAL_COMMUNITY): Payer: Self-pay

## 2018-09-20 ENCOUNTER — Ambulatory Visit (HOSPITAL_BASED_OUTPATIENT_CLINIC_OR_DEPARTMENT_OTHER)
Admission: RE | Admit: 2018-09-20 | Discharge: 2018-09-20 | Disposition: A | Discharge status: Home / Self Care | Payer: Self-pay | Attending: Surgery | Admitting: Surgery

## 2018-09-20 ENCOUNTER — Encounter (HOSPITAL_BASED_OUTPATIENT_CLINIC_OR_DEPARTMENT_OTHER): Payer: Self-pay

## 2018-09-20 ENCOUNTER — Ambulatory Visit (HOSPITAL_BASED_OUTPATIENT_CLINIC_OR_DEPARTMENT_OTHER): Payer: Self-pay | Admitting: Anesthesiology

## 2018-09-20 ENCOUNTER — Encounter (HOSPITAL_BASED_OUTPATIENT_CLINIC_OR_DEPARTMENT_OTHER)
Admission: RE | Disposition: A | Discharge status: Home / Self Care | Payer: Self-pay | Source: Home / Self Care | Attending: Surgery

## 2018-09-20 ENCOUNTER — Other Ambulatory Visit: Payer: Self-pay

## 2018-09-20 ENCOUNTER — Ambulatory Visit (HOSPITAL_BASED_OUTPATIENT_CLINIC_OR_DEPARTMENT_OTHER): Payer: Self-pay | Admitting: Vascular Surgery

## 2018-09-20 ENCOUNTER — Other Ambulatory Visit (HOSPITAL_BASED_OUTPATIENT_CLINIC_OR_DEPARTMENT_OTHER): Payer: Self-pay

## 2018-09-20 DIAGNOSIS — Z95828 Presence of other vascular implants and grafts: Secondary | ICD-10-CM

## 2018-09-20 DIAGNOSIS — Z452 Encounter for adjustment and management of vascular access device: Secondary | ICD-10-CM

## 2018-09-20 HISTORY — PX: PORTACATH PLACEMENT: SHX2246

## 2018-09-20 SURGERY — INSERTION, TUNNELED CENTRAL VENOUS DEVICE, WITH PORT
Anesthesia: General | Site: Chest | Laterality: Right

## 2018-09-20 MED ORDER — SODIUM CHLORIDE 0.9% FLUSH: 3.0000 mL | Freq: Two times a day (BID) | INTRAVENOUS | Status: DC

## 2018-09-20 MED ORDER — FENTANYL CITRATE (PF) 100 MCG/2ML IJ SOLN
50.0000 ug | INTRAMUSCULAR | Status: DC | PRN
  Administered 2018-09-20: 10:00:00 25 ug via INTRAVENOUS

## 2018-09-20 MED ORDER — ONDANSETRON HCL 4 MG/2ML IJ SOLN
INTRAMUSCULAR | Status: AC
  Filled 2018-09-20: qty 2

## 2018-09-20 MED ORDER — FENTANYL CITRATE (PF) 100 MCG/2ML IJ SOLN: 25.0000 ug | INTRAMUSCULAR | Status: DC | PRN

## 2018-09-20 MED ORDER — MIDAZOLAM HCL 2 MG/2ML IJ SOLN
INTRAMUSCULAR | Status: AC
  Filled 2018-09-20: qty 2

## 2018-09-20 MED ORDER — HEPARIN SOD (PORK) LOCK FLUSH 100 UNIT/ML IV SOLN
INTRAVENOUS | Status: DC | PRN
  Administered 2018-09-20: 500 [IU] via INTRAVENOUS

## 2018-09-20 MED ORDER — SODIUM CHLORIDE 0.9% FLUSH: 3.0000 mL | INTRAVENOUS | Status: DC | PRN

## 2018-09-20 MED ORDER — ACETAMINOPHEN 500 MG PO TABS
1000.0000 mg | ORAL_TABLET | Freq: Once | ORAL | Status: AC
  Administered 2018-09-20: 1000 mg via ORAL

## 2018-09-20 MED ORDER — MIDAZOLAM HCL 2 MG/2ML IJ SOLN
1.0000 mg | INTRAMUSCULAR | Status: DC | PRN
  Administered 2018-09-20: 10:00:00 2 mg via INTRAVENOUS

## 2018-09-20 MED ORDER — VANCOMYCIN HCL 500 MG IV SOLR
INTRAVENOUS | Status: AC
  Filled 2018-09-20: qty 500

## 2018-09-20 MED ORDER — SCOPOLAMINE 1 MG/3DAYS TD PT72: 1.0000 | MEDICATED_PATCH | Freq: Once | TRANSDERMAL | Status: DC | PRN

## 2018-09-20 MED ORDER — TRAMADOL HCL 50 MG PO TABS: 50.0000 mg | ORAL_TABLET | Freq: Four times a day (QID) | ORAL | 0 refills | Status: AC | PRN

## 2018-09-20 MED ORDER — SODIUM CHLORIDE 0.9 % IV SOLN: 250.0000 mL | INTRAVENOUS | Status: DC | PRN

## 2018-09-20 MED ORDER — ONDANSETRON HCL 4 MG/2ML IJ SOLN
INTRAMUSCULAR | Status: DC | PRN
  Administered 2018-09-20: 4 mg via INTRAVENOUS

## 2018-09-20 MED ORDER — ACETAMINOPHEN 325 MG PO TABS: 650.0000 mg | ORAL_TABLET | ORAL | Status: DC | PRN

## 2018-09-20 MED ORDER — CHLORHEXIDINE GLUCONATE 4 % EX LIQD: 60.0000 mL | Freq: Once | CUTANEOUS | Status: DC

## 2018-09-20 MED ORDER — CEFAZOLIN SODIUM-DEXTROSE 2-4 GM/100ML-% IV SOLN
INTRAVENOUS | Status: AC
  Filled 2018-09-20: qty 100

## 2018-09-20 MED ORDER — BUPIVACAINE-EPINEPHRINE 0.25% -1:200000 IJ SOLN
INTRAMUSCULAR | Status: AC
  Filled 2018-09-20: qty 1

## 2018-09-20 MED ORDER — PHENYLEPHRINE HCL (PRESSORS) 10 MG/ML IV SOLN
INTRAVENOUS | Status: AC
  Filled 2018-09-20: qty 1

## 2018-09-20 MED ORDER — PHENYLEPHRINE HCL (PRESSORS) 10 MG/ML IV SOLN
INTRAVENOUS | Status: DC | PRN
  Administered 2018-09-20: 120 ug via INTRAVENOUS

## 2018-09-20 MED ORDER — OXYCODONE HCL 5 MG PO TABS: 5.0000 mg | ORAL_TABLET | ORAL | Status: DC | PRN

## 2018-09-20 MED ORDER — HEPARIN (PORCINE) IN NACL 2-0.9 UNITS/ML
INTRAMUSCULAR | Status: AC | PRN
  Administered 2018-09-20: 500 mL via INTRAVENOUS

## 2018-09-20 MED ORDER — ACETAMINOPHEN 500 MG PO TABS
ORAL_TABLET | ORAL | Status: AC
  Filled 2018-09-20: qty 2

## 2018-09-20 MED ORDER — BUPIVACAINE-EPINEPHRINE 0.25% -1:200000 IJ SOLN
INTRAMUSCULAR | Status: DC | PRN
  Administered 2018-09-20: 5 mL

## 2018-09-20 MED ORDER — HEPARIN SODIUM (PORCINE) 5000 UNIT/ML IJ SOLN
INTRAMUSCULAR | Status: AC
  Filled 2018-09-20: qty 1

## 2018-09-20 MED ORDER — PROPOFOL 10 MG/ML IV BOLUS
INTRAVENOUS | Status: DC | PRN
  Administered 2018-09-20: 40 mg via INTRAVENOUS
  Administered 2018-09-20: 30 mg via INTRAVENOUS
  Administered 2018-09-20: 50 mg via INTRAVENOUS

## 2018-09-20 MED ORDER — ACETAMINOPHEN 650 MG RE SUPP: 650.0000 mg | RECTAL | Status: DC | PRN

## 2018-09-20 MED ORDER — HEPARIN (PORCINE) IN NACL 1000-0.9 UT/500ML-% IV SOLN
INTRAVENOUS | Status: AC
  Filled 2018-09-20: qty 500

## 2018-09-20 MED ORDER — SODIUM CHLORIDE 0.9 % IV SOLN
INTRAVENOUS | Status: DC | PRN
  Administered 2018-09-20: 10:00:00 50 ug/min via INTRAVENOUS

## 2018-09-20 MED ORDER — FENTANYL CITRATE (PF) 100 MCG/2ML IJ SOLN
INTRAMUSCULAR | Status: AC
  Filled 2018-09-20: qty 2

## 2018-09-20 MED ORDER — LACTATED RINGERS IV SOLN
INTRAVENOUS | Status: DC
  Administered 2018-09-20: 10 mL/h via INTRAVENOUS

## 2018-09-20 MED ORDER — CEFAZOLIN SODIUM-DEXTROSE 2-4 GM/100ML-% IV SOLN
2.0000 g | INTRAVENOUS | Status: AC
  Administered 2018-09-20: 10:00:00 2 g via INTRAVENOUS

## 2018-09-20 MED ORDER — HEPARIN SOD (PORK) LOCK FLUSH 100 UNIT/ML IV SOLN
INTRAVENOUS | Status: AC
  Filled 2018-09-20: qty 5

## 2018-09-20 SURGICAL SUPPLY — 39 items
ADH SKN CLS APL DERMABOND .7 (GAUZE/BANDAGES/DRESSINGS) ×1
APL PRP STRL LF DISP 70% ISPRP (MISCELLANEOUS) ×1
APL SKNCLS STERI-STRIP NONHPOA (GAUZE/BANDAGES/DRESSINGS) ×1
BAG DECANTER FOR FLEXI CONT (MISCELLANEOUS) ×3 IMPLANT
BENZOIN TINCTURE PRP APPL 2/3 (GAUZE/BANDAGES/DRESSINGS) ×3 IMPLANT
BLADE SURG 11 STRL SS (BLADE) ×3 IMPLANT
BLADE SURG 15 STRL LF DISP TIS (BLADE) ×1 IMPLANT
BLADE SURG 15 STRL SS (BLADE) ×3
CHLORAPREP W/TINT 26 (MISCELLANEOUS) ×3 IMPLANT
COVER SURGICAL LIGHT HANDLE (MISCELLANEOUS) ×3 IMPLANT
COVER WAND RF STERILE (DRAPES) IMPLANT
DECANTER SPIKE VIAL GLASS SM (MISCELLANEOUS) ×3 IMPLANT
DERMABOND ADVANCED (GAUZE/BANDAGES/DRESSINGS) ×2
DERMABOND ADVANCED .7 DNX12 (GAUZE/BANDAGES/DRESSINGS) ×1 IMPLANT
DRAPE C-ARM 42X72 X-RAY (DRAPES) ×3 IMPLANT
DRAPE LAPAROSCOPIC ABDOMINAL (DRAPES) ×3 IMPLANT
ELECT REM PT RETURN 9FT ADLT (ELECTROSURGICAL) ×3
ELECTRODE REM PT RTRN 9FT ADLT (ELECTROSURGICAL) ×1 IMPLANT
GAUZE 4X4 16PLY RFD (DISPOSABLE) ×3 IMPLANT
GAUZE SPONGE 4X4 12PLY STRL (GAUZE/BANDAGES/DRESSINGS) ×3 IMPLANT
GLOVE BIOGEL PI IND STRL 7.0 (GLOVE) ×1 IMPLANT
GLOVE BIOGEL PI INDICATOR 7.0 (GLOVE) ×2
GLOVE SURG SS PI 7.0 STRL IVOR (GLOVE) ×3 IMPLANT
GOWN STRL REUS W/TWL LRG LVL3 (GOWN DISPOSABLE) ×3 IMPLANT
GOWN STRL REUS W/TWL XL LVL3 (GOWN DISPOSABLE) ×3 IMPLANT
KIT PORT POWER 8FR ISP CVUE (Port) ×3 IMPLANT
NEEDLE HYPO 22GX1.5 SAFETY (NEEDLE) ×1 IMPLANT
PACK BASIN DAY SURGERY FS (CUSTOM PROCEDURE TRAY) ×3 IMPLANT
PENCIL BUTTON HOLSTER BLD 10FT (ELECTRODE) ×3 IMPLANT
SUT MNCRL AB 4-0 PS2 18 (SUTURE) ×3 IMPLANT
SUT PROLENE 3 0 PS 2 (SUTURE) ×2 IMPLANT
SUT VIC AB 2-0 SH 18 (SUTURE) IMPLANT
SUT VIC AB 2-0 SH 27 (SUTURE) ×3
SUT VIC AB 2-0 SH 27XBRD (SUTURE) ×1 IMPLANT
SUT VIC AB 3-0 SH 27 (SUTURE)
SUT VIC AB 3-0 SH 27X BRD (SUTURE) IMPLANT
SYR 10ML LL (SYRINGE) ×3 IMPLANT
SYR 20CC LL (SYRINGE) ×3 IMPLANT
TOWEL OR 17X24 6PK STRL BLUE (TOWEL DISPOSABLE) ×3 IMPLANT

## 2018-09-20 NOTE — H&P (Signed)
Alex [name] is an 68 y.o. male.   Chief Complaint: metastatic pancreatic cancer HPI: Presents for port placement for chemotherapy. No recent changes to his health.   Past Medical History:  Diagnosis Date  . Abdominal distension 09/14/2017  . Adenocarcinoma of sigmoid colon ([place]) 2019   with involvement of rectosigmoid junction  . Anemia 08/26/2017  . Aortic atherosclerosis ([place]) 08/27/2017  . Atherosclerosis of arteries   . Carotid artery occlusion   . Colon obstruction ([place])   . Colon polyps   . Community acquired pneumonia of right lower lobe of lung ([place]) 08/04/2017  . Dehydration   . Empyema ([place]) 08/27/2017  . Empyema of right pleural space ([place]) 08/30/2017  . High cholesterol   . History of stroke 08/04/2017  . Hypertension   . Hypokalemia 09/14/2017  . Hyponatremia 08/26/2017  . Large bowel obstruction ([place]) 09/13/2017  . Lobar pneumonia ([place]) 08/27/2017  . Malignant tumor of sigmoid colon ([place])   . Nausea & vomiting 09/14/2017  . Nausea and vomiting 09/14/2017  . Pleural effusion 08/25/2017  . Pleural effusion, right 08/27/2017  . Pneumonia   . Pulmonary nodule, right 08/27/2017  . Stroke [facility]) 2010   denies residual on 09/18/2017    Past Surgical History:  Procedure Laterality Date  . ANKLE FRACTURE SURGERY Right 1984  . DECORTICATION Right 08/30/2017   Procedure: DECORTICATION of right lower lung lobe;  Surgeon: Prescott Gum, Collier Salina, MD;  Location: [place];  Service: Thoracic;  Laterality: Right;  . FLEXIBLE SIGMOIDOSCOPY N/A 09/18/2017   Procedure: FLEXIBLE SIGMOIDOSCOPY;  Surgeon: Jerene Bears, MD;  Location: [facility] ENDOSCOPY;  Service: Gastroenterology;  Laterality: N/A;  . PARTIAL COLECTOMY N/A 09/20/2017   Procedure: OPEN PARTIAL COLECTOMY WITH COLOSTOMY;  Surgeon: Clovis Riley, MD;  Location: [place];  Service: General;  Laterality: N/A;  . VIDEO ASSISTED THORACOSCOPY (VATS)/EMPYEMA Right 08/30/2017   Procedure: VIDEO ASSISTED THORACOSCOPY (VATS)/EMPYEMA   ;  Surgeon: Ivin Poot, MD;  Location: [facility] OR;  Service: Thoracic;  Laterality: Right;    Family History  Problem Relation Age of Onset  . Heart disease Other   . Hypertension Father   . Stroke Father   . Heart disease Father   . Diabetes Sister   . Diabetes Brother   . Head & neck cancer Brother    Social History:  reports that he has never smoked. He has never used smokeless tobacco. He reports previous alcohol use. He reports current drug use. Drug: Marijuana.  Allergies: No Known Allergies  Medications Prior to Admission  Medication Sig Dispense Refill  . amLODipine (NORVASC) 10 MG tablet Take 10 mg by mouth daily.    Marland Kitchen aspirin 81 MG tablet Take 81 mg by mouth daily.    Marland Kitchen atenolol (TENORMIN) 25 MG tablet Take 25 mg by mouth daily.    . clopidogrel (PLAVIX) 75 MG tablet Take 75 mg by mouth daily.    . hydrochlorothiazide (HYDRODIURIL) 25 MG tablet Take 25 mg by mouth daily.    . simvastatin (ZOCOR) 20 MG tablet Take 20 mg by mouth at bedtime.     Marland Kitchen acetaminophen (TYLENOL) 500 MG tablet Take 2 tablets (1,000 mg total) by mouth every 6 (six) hours as needed. (Patient taking differently: Take 1,000 mg by mouth every 6 (six) hours as needed for moderate pain. ) 30 tablet 0    No results found for this or any previous visit (from the past 48 hour(s)). No results found.  Review of Systems  All other systems reviewed and are negative.   Blood pressure 116/70, pulse (!) 54, temperature (!) 97.5 F (36.4 C), temperature source Oral, resp. rate 18, height 5\' 6"  (1.676 m), weight 92 kg, SpO2 99 %. Physical Exam  Constitutional: He appears well-developed and well-nourished.  HENT:  Head: Normocephalic and atraumatic.  Eyes: Conjunctivae are normal.  Neck: Normal range of motion. No JVD present. No tracheal deviation present.  Cardiovascular: Regular rhythm.  Respiratory: Effort normal.  Lymphadenopathy:    He has no cervical adenopathy.     Assessment/Plan To OR for port placement. Plan right  IJ approach.   Clovis Riley, MD 09/20/2018, 9:32 AM

## 2018-09-20 NOTE — Transfer of Care (Signed)
Immediate Anesthesia Transfer of Care Note  Patient: Alex [name]  Procedure(s) Performed: INSERTION PORT-A-CATH WITH ULTRASOUND (Right Chest)  Patient Location: PACU  Anesthesia Type:General  Level of Consciousness: awake, alert , oriented and patient cooperative  Airway & Oxygen Therapy: Patient Spontanous Breathing and Patient connected to nasal cannula oxygen  Post-op Assessment: Report given to RN and Post -op Vital signs reviewed and stable  Post vital signs: Reviewed and stable  Last Vitals:  Vitals Value Taken Time  BP    Temp    Pulse 65 09/20/2018 11:04 AM  Resp    SpO2 99 % 09/20/2018 11:04 AM  Vitals shown include unvalidated device data.  Last Pain:  Vitals:   09/20/18 0857  TempSrc: Oral      Patients Stated Pain Goal: 3 (94/07/68 0881)  Complications: No apparent anesthesia complications

## 2018-09-20 NOTE — Discharge Instructions (Signed)
PORT-A-CATH: POST OP INSTRUCTIONS  Always review your discharge instruction sheet given to you by the facility where your surgery was performed.   1. A prescription for pain medication may be given to you upon discharge. Take your pain medication as prescribed, if needed. If narcotic pain medicine is not needed, then you make take acetaminophen (Tylenol) or ibuprofen (Advil) as needed.  2. Take your usually prescribed medications unless otherwise directed. 3. If you need a refill on your pain medication, please contact our office. All narcotic pain medicine now requires a paper prescription.  Phoned in and fax refills are no longer allowed by law.  Prescriptions will not be filled after 5 pm or on weekends.  4. You should follow a light diet for the remainder of the day after your procedure. 5. Most patients will experience some mild swelling and/or bruising in the area of the incision. It may take several days to resolve. 6. It is common to experience some constipation if taking pain medication after surgery. Increasing fluid intake and taking a stool softener (such as Colace) will usually help or prevent this problem from occurring. A mild laxative (Milk of Magnesia or Miralax) should be taken according to package directions if there are no bowel movements after 48 hours.  7. Unless discharge instructions indicate otherwise, you may remove your bandages 48 hours after surgery, and you may shower at that time. You may have steri-strips (small white skin tapes) in place directly over the incision.  These strips should be left on the skin for 7-10 days.  If your surgeon used Dermabond (skin glue) on the incision, you may shower in 24 hours.  The glue will flake off over the next 2-3 weeks.  8. If your port is left accessed at the end of surgery (needle left in port), the dressing cannot get wet and should only by changed by a healthcare professional. When the port is no longer accessed (when the  needle has been removed), follow step 7.   9. ACTIVITIES:  Limit activity involving your arms for the next 72 hours. Do no strenuous exercise or activity for 1 week. You may drive when you are no longer taking prescription pain medication, you can comfortably wear a seatbelt, and you can maneuver your car. 10.You may need to see your doctor in the office for a follow-up appointment.  Please       check with your doctor.  11.When you receive a new Port-a-Cath, you will get a product guide and        ID card.  Please keep them in case you need them.  WHEN TO CALL YOUR DOCTOR [phone]): 1. Fever over 101.0 2. Chills 3. Continued bleeding from incision 4. Increased redness and tenderness at the site 5. Shortness of breath, difficulty breathing   The clinic staff is available to answer your questions during regular business hours. Please dont hesitate to call and ask to speak to one of the nurses or medical assistants for clinical concerns. If you have a medical emergency, go to the nearest emergency room or call 911.  A surgeon from [facility] is always on call at the hospital.     For further information, please visit [email or web address]      Post Anesthesia Home Care Instructions  Activity: Get plenty of rest for the remainder of the day. A responsible individual must stay with you for 24 hours following the procedure.  For the next 24 hours, DO NOT: -  Drive a car -Paediatric nurse -Drink alcoholic beverages -Take any medication unless instructed by your physician -Make any legal decisions or sign important papers.  Meals: Start with liquid foods such as gelatin or soup. Progress to regular foods as tolerated. Avoid greasy, spicy, heavy foods. If nausea and/or vomiting occur, drink only clear liquids until the nausea and/or vomiting subsides. Call your physician if vomiting continues.  Special Instructions/Symptoms: Your throat may feel dry or sore  from the anesthesia or the breathing tube placed in your throat during surgery. If this causes discomfort, gargle with warm salt water. The discomfort should disappear within 24 hours.  Do not take any Tylenol until after 3:00 pm today.

## 2018-09-20 NOTE — Anesthesia Procedure Notes (Signed)
Procedure Name: LMA Insertion Date/Time: 09/20/2018 10:02 AM Performed by: Wanita Chamberlain, CRNA Pre-anesthesia Checklist: Patient identified, Emergency Drugs available, Suction available, Patient being monitored and Timeout performed Patient Re-evaluated:Patient Re-evaluated prior to induction Oxygen Delivery Method: Circle system utilized Preoxygenation: Pre-oxygenation with 100% oxygen Induction Type: IV induction Ventilation: Mask ventilation without difficulty LMA: LMA inserted LMA Size: 4.0 Number of attempts: 1 Placement Confirmation: breath sounds checked- equal and bilateral,  CO2 detector and positive ETCO2 Tube secured with: Tape Dental Injury: Teeth and Oropharynx as per pre-operative assessment

## 2018-09-20 NOTE — Anesthesia Preprocedure Evaluation (Addendum)
Anesthesia Evaluation  Patient identified by MRN, date of birth, ID band Patient awake    Reviewed: Allergy & Precautions, NPO status , Patient's Chart, lab work & pertinent test results, reviewed documented beta blocker date and time   Airway Mallampati: II  TM Distance: >3 FB Neck ROM: Full    Dental no notable dental hx. (+) Missing, Dental Advisory Given,    Pulmonary neg pulmonary ROS,    Pulmonary exam normal breath sounds clear to auscultation       Cardiovascular hypertension, Pt. on medications and Pt. on home beta blockers + Peripheral Vascular Disease  Normal cardiovascular exam Rhythm:Regular Rate:Normal  Carotid Duplex 2018 Right ICA 60-79% stenosis Known complete occlusion of left ICA  TTE 2019 EF 65-70%, mild AI   Neuro/Psych CVA (on plavix) negative psych ROS   GI/Hepatic negative GI ROS, Neg liver ROS,   Endo/Other  negative endocrine ROS  Renal/GU negative Renal ROS  negative genitourinary   Musculoskeletal negative musculoskeletal ROS (+)   Abdominal   Peds  Hematology negative hematology ROS (+)   Anesthesia Other Findings Colon CA  Reproductive/Obstetrics                           Anesthesia Physical Anesthesia Plan  ASA: III  Anesthesia Plan: General   Post-op Pain Management:    Induction: Intravenous  PONV Risk Score and Plan: 2 and Ondansetron, Dexamethasone and Midazolam  Airway Management Planned: LMA  Additional Equipment:   Intra-op Plan:   Post-operative Plan: Extubation in OR  Informed Consent: I have reviewed the patients History and Physical, chart, labs and discussed the procedure including the risks, benefits and alternatives for the proposed anesthesia with the patient or authorized representative who has indicated his/her understanding and acceptance.     Dental advisory given  Plan Discussed with: CRNA  Anesthesia Plan Comments:          Anesthesia Quick Evaluation

## 2018-09-20 NOTE — Anesthesia Postprocedure Evaluation (Signed)
Anesthesia Post Note  Patient: Alex [name]  Procedure(s) Performed: INSERTION PORT-A-CATH WITH ULTRASOUND (Right Chest)     Patient location during evaluation: PACU Anesthesia Type: General Level of consciousness: awake and alert Pain management: pain level controlled Vital Signs Assessment: post-procedure vital signs reviewed and stable Respiratory status: spontaneous breathing, nonlabored ventilation, respiratory function stable and patient connected to nasal cannula oxygen Cardiovascular status: blood pressure returned to baseline and stable Postop Assessment: no apparent nausea or vomiting Anesthetic complications: no    Last Vitals:  Vitals:   09/20/18 1200 09/20/18 1210  BP: 120/77 118/77  Pulse: (!) 55 (!) 51  Resp: 15 16  Temp:  36.5 C  SpO2: 96% 96%    Last Pain:  Vitals:   09/20/18 1233  TempSrc:   PainSc: 0-No pain                 Shihab States L Nochum Fenter

## 2018-09-20 NOTE — Op Note (Addendum)
Operative Note  Alex [name]  [id number]  [id number]  09/20/2018   Surgeon: Vikki Ports A ConnorMD  Assistant: OR staff  Procedure performed: Insertion of tunneled right IJ Port-A-Cath  Preop diagnosis: Metastatic pancreatic cancer Post-op diagnosis/intraop findings: Same  Specimens: None Retained items: Tunneled right IJ 12fr single lumen PowerPort EBL: Minimal cc Complications: none  Description of procedure: After obtaining informed consent the patient was taken to the operating room and placed supine on operating room table wheregeneral LMA anesthesia was initiated, preoperative antibiotics were administered, SCDs applied, and a formal timeout was performed.  The patient's arms were tucked and a shoulder roll was placed.  The right neck and chest were prepped and draped in usual sterile fashion.  Under ultrasound guidance, the right internal jugular vein was accessed on the first stick and the guidewire was inserted without resistance.  Fluoroscopy was used to ensure that the wire traversed the SVC and right atrium.  A skin incision was then made over the access needle and the needle removed leaving the wire in place.  The dilator and sheath were then inserted over the wire and fluoroscopy again employed to confirm the sheath sitting in the superior vena cava.  The dilator and wire were then removed, leaving the peel-away sheath in situ.  At this juncture an incision was made in the midclavicular line approximately 2 fingerbreadths below the clavicle and pocket developed for the port bluntly.  The tubing was tunneled through the subcutaneous tissues from the chest wound to the incision in the right neck alongside the sheath.  The port was trimmed to a length that that would leave the tip at the cavoatrial junction. After ensuring that the port would fit within the pocket, the port and tubing were connected and locked.  The port and tubing were then flushed at this point to ensure that  there was no leak within the tubing or the connection point.  The port is inserted into the subcutaneous pocket and sutured to the chest wall with 2 sutures of 3-0 Prolene.  At this point the catheter was inserted into the peel-away sheath and advanced and then the peel-away sheath was slowly removed.  There was a bridge of subcutaneous tissue between the tunneling exit site and the sheath entry site which was divided with cautery so that the catheter can smoothly turn and would not kink over this bridge of tissue.  Fluoroscopy was again used, confirming the catheter in the correct position.  Gentle pressure was held on the catheter entry point for several minutes.  The chest incision was closed with deep dermal 3-0 Vicryl interrupted and running subcuticular Monocryl.  The neck incision was closed with interrupted subcuticular Monocryl.  Both wounds were dressed with Dermabond. The patient was then awakened, extubated and taken to PACU in stable condition.   All counts were correct at the completion of the case.   CXR was performed in PACU and reviewed by me on the portable machine. There is no pneumothorax and the tip of the catheter is in the SVC.

## 2018-09-23 ENCOUNTER — Other Ambulatory Visit: Payer: Self-pay

## 2018-09-23 ENCOUNTER — Encounter (HOSPITAL_BASED_OUTPATIENT_CLINIC_OR_DEPARTMENT_OTHER): Payer: Self-pay | Admitting: Surgery

## 2018-09-26 ENCOUNTER — Other Ambulatory Visit: Payer: Self-pay

## 2018-09-26 ENCOUNTER — Ambulatory Visit
Admission: RE | Admit: 2018-09-26 | Discharge: 2018-09-26 | Disposition: A | Discharge status: Home / Self Care | Payer: Self-pay | Source: Ambulatory Visit | Attending: Nurse Practitioner | Admitting: Nurse Practitioner

## 2018-09-26 DIAGNOSIS — C252 Malignant neoplasm of tail of pancreas: Secondary | ICD-10-CM

## 2018-09-26 MED ORDER — IOPAMIDOL (ISOVUE-300) INJECTION 61%
100.0000 mL | Freq: Once | INTRAVENOUS | Status: AC | PRN
  Administered 2018-09-26: 100 mL via INTRAVENOUS

## 2018-10-04 ENCOUNTER — Inpatient Hospital Stay: Payer: Self-pay | Attending: Nurse Practitioner | Admitting: Oncology

## 2018-10-04 ENCOUNTER — Telehealth: Payer: Self-pay | Admitting: Oncology

## 2018-10-04 ENCOUNTER — Other Ambulatory Visit: Payer: Self-pay

## 2018-10-04 ENCOUNTER — Other Ambulatory Visit: Payer: Self-pay | Admitting: *Deleted

## 2018-10-04 ENCOUNTER — Inpatient Hospital Stay: Payer: Self-pay

## 2018-10-04 VITALS — BP 114/67 | HR 64 | Temp 98.0°F | Resp 18 | Ht 66.0 in | Wt 205.5 lb

## 2018-10-04 DIAGNOSIS — I82890 Acute embolism and thrombosis of other specified veins: Secondary | ICD-10-CM

## 2018-10-04 DIAGNOSIS — Z933 Colostomy status: Secondary | ICD-10-CM

## 2018-10-04 DIAGNOSIS — C19 Malignant neoplasm of rectosigmoid junction: Secondary | ICD-10-CM

## 2018-10-04 DIAGNOSIS — Z5111 Encounter for antineoplastic chemotherapy: Secondary | ICD-10-CM | POA: Insufficient documentation

## 2018-10-04 DIAGNOSIS — C787 Secondary malignant neoplasm of liver and intrahepatic bile duct: Secondary | ICD-10-CM

## 2018-10-04 DIAGNOSIS — Z7189 Other specified counseling: Secondary | ICD-10-CM

## 2018-10-04 DIAGNOSIS — Z8673 Personal history of transient ischemic attack (TIA), and cerebral infarction without residual deficits: Secondary | ICD-10-CM

## 2018-10-04 DIAGNOSIS — Z9049 Acquired absence of other specified parts of digestive tract: Secondary | ICD-10-CM

## 2018-10-04 DIAGNOSIS — C252 Malignant neoplasm of tail of pancreas: Secondary | ICD-10-CM

## 2018-10-04 MED ORDER — PROCHLORPERAZINE MALEATE 10 MG PO TABS: 10.0000 mg | ORAL_TABLET | Freq: Four times a day (QID) | ORAL | 2 refills | Status: DC | PRN

## 2018-10-04 MED ORDER — LIDOCAINE-PRILOCAINE 2.5-2.5 % EX CREA: 1.0000 "application " | TOPICAL_CREAM | CUTANEOUS | 1 refills | Status: DC | PRN

## 2018-10-04 NOTE — Telephone Encounter (Signed)
Scheduled appt per 5/8 los.

## 2018-10-04 NOTE — Progress Notes (Signed)
[place] OFFICE PROGRESS NOTE   Diagnosis: Pancreas cancer, colon cancer  INTERVAL HISTORY:   [name] returns as scheduled.  He feels well.  No abdominal pain.  He underwent Port-A-Cath placement on 09/20/2018.  Good appetite and energy level.  Objective:  Vital signs in last 24 hours:  Blood pressure 114/67, pulse 64, temperature 98 F (36.7 C), temperature source Oral, resp. rate 18, height '5\' 6"'  (1.676 m), weight 205 lb 8 oz (93.2 kg), SpO2 100 %.    HEENT: Neck without mass, slight fullness in the right supraclavicular fossa Resp: Decreased breath sounds with inspiratory rhonchi at the right posterior base, no respiratory distress Cardio: Regular rate and rhythm GI: No hepatomegaly, no mass, nontender Vascular: No leg edema   Portacath/PICC-without erythema  Lab Results:  Lab Results  Component Value Date   WBC 8.8 08/19/2018   HGB 16.3 08/19/2018   HCT 50.7 08/19/2018   MCV 89.3 08/19/2018   PLT 278 08/19/2018   NEUTROABS 6.5 08/19/2018    CMP  Lab Results  Component Value Date   NA 138 09/13/2018   K 3.7 09/13/2018   CL 98 09/13/2018   CO2 27 09/13/2018   GLUCOSE 131 (H) 09/13/2018   BUN 16 09/13/2018   CREATININE 1.04 09/13/2018   CALCIUM 9.2 09/13/2018   PROT 8.6 (H) 08/19/2018   ALBUMIN 4.3 08/19/2018   AST 20 08/19/2018   ALT 22 08/19/2018   ALKPHOS 59 08/19/2018   BILITOT 0.9 08/19/2018   GFRNONAA >60 09/13/2018   GFRAA >60 09/13/2018    Lab Results  Component Value Date   CEA1 1.19 08/19/2018    Lab Results  Component Value Date   INR 1.0 08/14/2018    Imaging: CT images from 09/26/2018-reviewed Medications: I have reviewed the patient's current medications.   Assessment/Plan: 1. 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 Well-differentiated adenocarcinoma, 0/13 lymph nodes positive, MSI-stable, no loss of mismatch repair protein  expression ? Tumor involving the rectosigmoid junction ? Sigmoidoscopy 09/18/2017- distal sigmoid colon tumor beginning between 15 and 20 cm from the dentate line, completely obstructing ? CT abdomen/pelvis 09/14/2017-sigmoid thickening no evidence of metastatic disease ? 2 mm right upper lobe nodule on CT 08/04/2017 ? Colonoscopy 02/07/2018- polyps removed from the cecum, ascending colon, transverse colon, descending colon, rectosigmoid colon and rectum (tubular adenomas)  2. Right lung pneumonia/empyema March 2019  Right VATS, drainage of empyema, and decortication of the right lower lobe 08/31/2017  3.CVA in 2010  4.Rectal and active sigmoid polyps noted on flexible sigmoidoscopy 09/18/2017  5.  Pancreas tail mass/right liver lesions and 4 mm splenic lesion on CT abdomen/pelvis 05/27/2018  MRI abdomen 07/27/2018- right liver lesions, rim-enhancing lesion in the pancreas tail  Ultrasound-guided biopsy of a right liver lesion 08/14/2018-metastatic adenocarcinoma, cytokeratin 7+, CDX-2 weak positive, cytokeratin 20 negative, consistent with metastatic pancreas cancer; MSI stable; tumor mutational burden 0  CT abdomen/pelvis 09/26/2018- increased size of previously noted liver lesions, new right liver lesion, increased size of pancreas mass with occlusion of the splenic vein  6.  Port-A-Cath placement by Dr. Kae Heller 09/20/2018     Disposition: [name] has been diagnosed with metastatic pancreas cancer.  He is asymptomatic, but the restaging CTs reveal disease progression in the pancreas and liver.  I discussed treatment options with [name].  His daughter was present by telephone.  We discussed FOLFIRINOX and gemcitabine/Abraxane chemotherapy.  They understand no therapy  will be curative.  I recommend gemcitabine/Abraxane.  We reviewed potential toxicities associated with this regimen including the chance for nausea/vomiting, alopecia, and hematologic toxicity.  We  discussed the fever, rash, and pneumonitis associated with gemcitabine.  We reviewed the allergic reaction and neuropathy seen with Abraxane.  He agrees to proceed.  He attended a chemotherapy teaching class today.  The plan is to begin gemcitabine/Abraxane chemotherapy on 10/11/2018.  He will return for office visit and chemotherapy on 10/25/2018.  We will follow the CA 19-9 and a repeat CT as markers of tumor response.  Betsy Coder, MD  10/04/2018  11:38 AM

## 2018-10-04 NOTE — Progress Notes (Signed)
START OFF PATHWAY REGIMEN - Pancreatic Adenocarcinoma   [id number]:Nab-Paclitaxel (Abraxane) 125 mg/m2 D1, 8, 15 + Gemcitabine 1,000 mg/m2 D1, 8, 15 q28 Days:   A cycle is every 28 days:     Nab-paclitaxel (protein bound)      Gemcitabine   **Always confirm dose/schedule in your pharmacy ordering system**  Patient Characteristics: Metastatic Disease, First Line, PS = 0,1, BRCA1/2 and PALB2  Mutation Absent/Unknown Current evidence of distant metastases<= Yes AJCC T Category: Staged < 8th Ed. AJCC N Category: Staged < 8th Ed. AJCC M Category: Staged < 8th Ed. AJCC 8 Stage Grouping: Staged < 8th Ed. Line of Therapy: First Engineer, maintenance (IT) Status: 0 BRCA1/2 Mutation Status: Absent PALB2 Mutation Status: Absent Intent of Therapy: Non-Curative / Palliative Intent, Discussed with Patient

## 2018-10-06 ENCOUNTER — Other Ambulatory Visit: Payer: Self-pay | Admitting: Oncology

## 2018-10-07 ENCOUNTER — Encounter: Payer: Self-pay | Admitting: Oncology

## 2018-10-07 NOTE — Progress Notes (Signed)
Reviewed chart from chemo ed list to identify available resources.  Patient had inpatient visit in 08/2017 which he was screened by Financial Counseling on the hospital side for possible Medicaid eligibility. Patient declined to apply for programs due to not wanting to provide information needed to submit with applications. This also disqualifies patient from applying for financial assistance through hospital.  Rob in pharmacy may screen for drug assistance if available. Will call Rob and give heads up.

## 2018-10-11 ENCOUNTER — Other Ambulatory Visit: Payer: Self-pay

## 2018-10-11 ENCOUNTER — Inpatient Hospital Stay: Payer: Self-pay

## 2018-10-11 VITALS — BP 126/71 | HR 58 | Temp 99.1°F | Resp 18 | Wt 206.5 lb

## 2018-10-11 DIAGNOSIS — C252 Malignant neoplasm of tail of pancreas: Secondary | ICD-10-CM

## 2018-10-11 DIAGNOSIS — Z95828 Presence of other vascular implants and grafts: Secondary | ICD-10-CM | POA: Insufficient documentation

## 2018-10-11 LAB — CBC WITH DIFFERENTIAL (CANCER CENTER ONLY)
Abs Immature Granulocytes: 0.02 10*3/uL (ref 0.00–0.07)
Basophils Absolute: 0.1 10*3/uL (ref 0.0–0.1)
Basophils Relative: 1 %
Eosinophils Absolute: 0.2 10*3/uL (ref 0.0–0.5)
Eosinophils Relative: 3 %
HCT: 45.9 % (ref 39.0–52.0)
Hemoglobin: 14.6 g/dL (ref 13.0–17.0)
Immature Granulocytes: 0 %
Lymphocytes Relative: 18 %
Lymphs Abs: 1.5 10*3/uL (ref 0.7–4.0)
MCH: 28.2 pg (ref 26.0–34.0)
MCHC: 31.8 g/dL (ref 30.0–36.0)
MCV: 88.8 fL (ref 80.0–100.0)
Monocytes Absolute: 0.8 10*3/uL (ref 0.1–1.0)
Monocytes Relative: 9 %
Neutro Abs: 5.8 10*3/uL (ref 1.7–7.7)
Neutrophils Relative %: 69 %
Platelet Count: 177 10*3/uL (ref 150–400)
RBC: 5.17 MIL/uL (ref 4.22–5.81)
RDW: 12.5 % (ref 11.5–15.5)
WBC Count: 8.4 10*3/uL (ref 4.0–10.5)
nRBC: 0 % (ref 0.0–0.2)

## 2018-10-11 LAB — CMP (CANCER CENTER ONLY)
ALT: 20 U/L (ref 0–44)
AST: 21 U/L (ref 15–41)
Albumin: 4.2 g/dL (ref 3.5–5.0)
Alkaline Phosphatase: 62 U/L (ref 38–126)
Anion gap: 8 (ref 5–15)
BUN: 14 mg/dL (ref 8–23)
CO2: 30 mmol/L (ref 22–32)
Calcium: 9.2 mg/dL (ref 8.9–10.3)
Chloride: 103 mmol/L (ref 98–111)
Creatinine: 1.04 mg/dL (ref 0.61–1.24)
GFR, Est AFR Am: >60 mL/min (ref >60)
GFR, Estimated: >60 mL/min (ref >60)
Glucose, Bld: 87 mg/dL (ref 70–99)
Potassium: 3.6 mmol/L (ref 3.5–5.1)
Sodium: 141 mmol/L (ref 135–145)
Total Bilirubin: 0.6 mg/dL (ref 0.3–1.2)
Total Protein: 7.7 g/dL (ref 6.5–8.1)

## 2018-10-11 MED ORDER — SODIUM CHLORIDE 0.9% FLUSH
10.0000 mL | INTRAVENOUS | Status: DC | PRN
  Administered 2018-10-11: 11:00:00 10 mL
  Filled 2018-10-11: qty 10

## 2018-10-11 MED ORDER — SODIUM CHLORIDE 0.9 % IV SOLN
Freq: Once | INTRAVENOUS | Status: AC
  Administered 2018-10-11: 12:00:00 via INTRAVENOUS
  Filled 2018-10-11: qty 250

## 2018-10-11 MED ORDER — PROCHLORPERAZINE MALEATE 10 MG PO TABS
10.0000 mg | ORAL_TABLET | Freq: Once | ORAL | Status: AC
  Administered 2018-10-11: 12:00:00 10 mg via ORAL

## 2018-10-11 MED ORDER — SODIUM CHLORIDE 0.9% FLUSH
10.0000 mL | INTRAVENOUS | Status: DC | PRN
  Administered 2018-10-11: 10 mL
  Filled 2018-10-11: qty 10

## 2018-10-11 MED ORDER — PROCHLORPERAZINE MALEATE 10 MG PO TABS
ORAL_TABLET | ORAL | Status: AC
  Filled 2018-10-11: qty 1

## 2018-10-11 MED ORDER — HEPARIN SOD (PORK) LOCK FLUSH 100 UNIT/ML IV SOLN
500.0000 [IU] | Freq: Once | INTRAVENOUS | Status: AC | PRN
  Administered 2018-10-11: 15:00:00 500 [IU]
  Filled 2018-10-11: qty 5

## 2018-10-11 MED ORDER — SODIUM CHLORIDE 0.9 % IV SOLN
1000.0000 mg/m2 | Freq: Once | INTRAVENOUS | Status: AC
  Administered 2018-10-11: 15:00:00 2090 mg via INTRAVENOUS
  Filled 2018-10-11: qty 54.97

## 2018-10-11 MED ORDER — PACLITAXEL PROTEIN-BOUND CHEMO INJECTION 100 MG
125.0000 mg/m2 | Freq: Once | INTRAVENOUS | Status: AC
  Administered 2018-10-11: 14:00:00 250 mg via INTRAVENOUS
  Filled 2018-10-11: qty 50

## 2018-10-11 NOTE — Progress Notes (Signed)
Stopped by infusion room to check on pt & see if he had any questions regarding Pt Ed.  He reports that he does not.  He already has his Ed packet.

## 2018-10-11 NOTE — Patient Instructions (Signed)
[facility] Discharge Instructions for Patients Receiving Chemotherapy  Today you received the following chemotherapy agents Abraxane and Gemzar  To help prevent nausea and vomiting after your treatment, we encourage you to take your nausea medication as directed  If you develop nausea and vomiting that is not controlled by your nausea medication, call the clinic.   BELOW ARE SYMPTOMS THAT SHOULD BE REPORTED IMMEDIATELY:  *FEVER GREATER THAN 100.5 F  *CHILLS WITH OR WITHOUT FEVER  NAUSEA AND VOMITING THAT IS NOT CONTROLLED WITH YOUR NAUSEA MEDICATION  *UNUSUAL SHORTNESS OF BREATH  *UNUSUAL BRUISING OR BLEEDING  TENDERNESS IN MOUTH AND THROAT WITH OR WITHOUT PRESENCE OF ULCERS  *URINARY PROBLEMS  *BOWEL PROBLEMS  UNUSUAL RASH Items with * indicate a potential emergency and should be followed up as soon as possible.  Feel free to call the clinic should you have any questions or concerns. The clinic phone number is [phone].  Please show the [facility] at check-in to the Emergency Department and triage nurse.  Nanoparticle Albumin-Bound Paclitaxel injection(Abraxane) What is this medicine? NANOPARTICLE ALBUMIN-BOUND PACLITAXEL (Na no PAHR ti kuhl al BYOO muhn-bound PAK li TAX el) is a chemotherapy drug. It targets fast dividing cells, like cancer cells, and causes these cells to die. This medicine is used to treat advanced breast cancer, lung cancer, and pancreatic cancer. This medicine may be used for other purposes; ask your health care provider or pharmacist if you have questions. COMMON BRAND NAME(S): Abraxane What should I tell my health care provider before I take this medicine? They need to know if you have any of these conditions: -kidney disease -liver disease -low blood counts, like low white cell, platelet, or red cell counts -lung or breathing disease, like asthma -tingling of the fingers or toes, or other nerve disorder -an unusual or  allergic reaction to paclitaxel, albumin, other chemotherapy, other medicines, foods, dyes, or preservatives -pregnant or trying to get pregnant -breast-feeding How should I use this medicine? This drug is given as an infusion into a vein. It is administered in a hospital or clinic by a specially trained health care professional. Talk to your pediatrician regarding the use of this medicine in children. Special care may be needed. Overdosage: If you think you have taken too much of this medicine contact a poison control center or emergency room at once. NOTE: This medicine is only for you. Do not share this medicine with others. What if I miss a dose? It is important not to miss your dose. Call your doctor or health care professional if you are unable to keep an appointment. What may interact with this medicine? This medicine may interact with the following medications: -antiviral medicines for hepatitis, HIV or AIDS -certain antibiotics like erythromycin and clarithromycin -certain medicines for fungal infections like ketoconazole and itraconazole -certain medicines for seizures like carbamazepine, phenobarbital, phenytoin -gemfibrozil -nefazodone -rifampin -St. John's wort This list may not describe all possible interactions. Give your health care provider a list of all the medicines, herbs, non-prescription drugs, or dietary supplements you use. Also tell them if you smoke, drink alcohol, or use illegal drugs. Some items may interact with your medicine. What should I watch for while using this medicine? Your condition will be monitored carefully while you are receiving this medicine. You will need important blood work done while you are taking this medicine. This medicine can cause serious allergic reactions. If you experience allergic reactions like skin rash, itching or hives, swelling of the  face, lips, or tongue, tell your doctor or health care professional right away. In some cases,  you may be given additional medicines to help with side effects. Follow all directions for their use. This drug may make you feel generally unwell. This is not uncommon, as chemotherapy can affect healthy cells as well as cancer cells. Report any side effects. Continue your course of treatment even though you feel ill unless your doctor tells you to stop. Call your doctor or health care professional for advice if you get a fever, chills or sore throat, or other symptoms of a cold or flu. Do not treat yourself. This drug decreases your body's ability to fight infections. Try to avoid being around people who are sick. This medicine may increase your risk to bruise or bleed. Call your doctor or health care professional if you notice any unusual bleeding. Be careful brushing and flossing your teeth or using a toothpick because you may get an infection or bleed more easily. If you have any dental work done, tell your dentist you are receiving this medicine. Avoid taking products that contain aspirin, acetaminophen, ibuprofen, naproxen, or ketoprofen unless instructed by your doctor. These medicines may hide a fever. Do not become pregnant while taking this medicine or for 6 months after stopping it. Women should inform their doctor if they wish to become pregnant or think they might be pregnant. Men should not father a child while taking this medicine or for 3 months after stopping it. There is a potential for serious side effects to an unborn child. Talk to your health care professional or pharmacist for more information. Do not breast-feed an infant while taking this medicine or for 2 weeks after stopping it. This medicine may interfere with the ability to get pregnant or to father a child. You should talk to your doctor or health care professional if you are concerned about your fertility. What side effects may I notice from receiving this medicine? Side effects that you should report to your doctor or  health care professional as soon as possible: -allergic reactions like skin rash, itching or hives, swelling of the face, lips, or tongue -breathing problems -changes in vision -fast, irregular heartbeat -low blood pressure -mouth sores -pain, tingling, numbness in the hands or feet -signs of decreased platelets or bleeding - bruising, pinpoint red spots on the skin, black, tarry stools, blood in the urine -signs of decreased red blood cells - unusually weak or tired, feeling faint or lightheaded, falls -signs of infection - fever or chills, cough, sore throat, pain or difficulty passing urine -signs and symptoms of liver injury like dark yellow or brown urine; general ill feeling or flu-like symptoms; light-colored stools; loss of appetite; nausea; right upper belly pain; unusually weak or tired; yellowing of the eyes or skin -swelling of the ankles, feet, hands -unusually slow heartbeat Side effects that usually do not require medical attention (report to your doctor or health care professional if they continue or are bothersome): -diarrhea -hair loss -loss of appetite -nausea, vomiting -tiredness This list may not describe all possible side effects. Call your doctor for medical advice about side effects. You may report side effects to FDA at 1-800-FDA-1088. Where should I keep my medicine? This drug is given in a hospital or clinic and will not be stored at home. NOTE: This sheet is a summary. It may not cover all possible information. If you have questions about this medicine, talk to your doctor, pharmacist, or health care provider.  2019 Elsevier/Gold Standard (2017-01-16 13:03:45)  Gemcitabine injection(Gemzar) What is this medicine? GEMCITABINE (jem SYE ta been) is a chemotherapy drug. This medicine is used to treat many types of cancer like breast cancer, lung cancer, pancreatic cancer, and ovarian cancer. This medicine may be used for other purposes; ask your health care  provider or pharmacist if you have questions. COMMON BRAND NAME(S): Gemzar, Infugem What should I tell my health care provider before I take this medicine? They need to know if you have any of these conditions: -blood disorders -infection -kidney disease -liver disease -lung or breathing disease, like asthma -recent or ongoing radiation therapy -an unusual or allergic reaction to gemcitabine, other chemotherapy, other medicines, foods, dyes, or preservatives -pregnant or trying to get pregnant -breast-feeding How should I use this medicine? This drug is given as an infusion into a vein. It is administered in a hospital or clinic by a specially trained health care professional. Talk to your pediatrician regarding the use of this medicine in children. Special care may be needed. Overdosage: If you think you have taken too much of this medicine contact a poison control center or emergency room at once. NOTE: This medicine is only for you. Do not share this medicine with others. What if I miss a dose? It is important not to miss your dose. Call your doctor or health care professional if you are unable to keep an appointment. What may interact with this medicine? -medicines to increase blood counts like filgrastim, pegfilgrastim, sargramostim -some other chemotherapy drugs like cisplatin -vaccines Talk to your doctor or health care professional before taking any of these medicines: -acetaminophen -aspirin -ibuprofen -ketoprofen -naproxen This list may not describe all possible interactions. Give your health care provider a list of all the medicines, herbs, non-prescription drugs, or dietary supplements you use. Also tell them if you smoke, drink alcohol, or use illegal drugs. Some items may interact with your medicine. What should I watch for while using this medicine? Visit your doctor for checks on your progress. This drug may make you feel generally unwell. This is not uncommon, as  chemotherapy can affect healthy cells as well as cancer cells. Report any side effects. Continue your course of treatment even though you feel ill unless your doctor tells you to stop. In some cases, you may be given additional medicines to help with side effects. Follow all directions for their use. Call your doctor or health care professional for advice if you get a fever, chills or sore throat, or other symptoms of a cold or flu. Do not treat yourself. This drug decreases your body's ability to fight infections. Try to avoid being around people who are sick. This medicine may increase your risk to bruise or bleed. Call your doctor or health care professional if you notice any unusual bleeding. Be careful brushing and flossing your teeth or using a toothpick because you may get an infection or bleed more easily. If you have any dental work done, tell your dentist you are receiving this medicine. Avoid taking products that contain aspirin, acetaminophen, ibuprofen, naproxen, or ketoprofen unless instructed by your doctor. These medicines may hide a fever. Do not become pregnant while taking this medicine or for 6 months after stopping it. Women should inform their doctor if they wish to become pregnant or think they might be pregnant. Men should not father a child while taking this medicine and for 3 months after stopping it. There is a potential for serious side effects to an  unborn child. Talk to your health care professional or pharmacist for more information. Do not breast-feed an infant while taking this medicine or for at least 1 week after stopping it. Men should inform their doctors if they wish to father a child. This medicine may lower sperm counts. Talk with your doctor or health care professional if you are concerned about your fertility. What side effects may I notice from receiving this medicine? Side effects that you should report to your doctor or health care professional as soon as  possible: -allergic reactions like skin rash, itching or hives, swelling of the face, lips, or tongue -breathing problems -pain, redness, or irritation at site where injected -signs and symptoms of a dangerous change in heartbeat or heart rhythm like chest pain; dizziness; fast or irregular heartbeat; palpitations; feeling faint or lightheaded, falls; breathing problems -signs of decreased platelets or bleeding - bruising, pinpoint red spots on the skin, black, tarry stools, blood in the urine -signs of decreased red blood cells - unusually weak or tired, feeling faint or lightheaded, falls -signs of infection - fever or chills, cough, sore throat, pain or difficulty passing urine -signs and symptoms of kidney injury like trouble passing urine or change in the amount of urine -signs and symptoms of liver injury like dark yellow or brown urine; general ill feeling or flu-like symptoms; light-colored stools; loss of appetite; nausea; right upper belly pain; unusually weak or tired; yellowing of the eyes or skin -swelling of ankles, feet, hands Side effects that usually do not require medical attention (report to your doctor or health care professional if they continue or are bothersome): -constipation -diarrhea -hair loss -loss of appetite -nausea -rash -vomiting This list may not describe all possible side effects. Call your doctor for medical advice about side effects. You may report side effects to FDA at 1-800-FDA-1088. Where should I keep my medicine? This drug is given in a hospital or clinic and will not be stored at home. NOTE: This sheet is a summary. It may not cover all possible information. If you have questions about this medicine, talk to your doctor, pharmacist, or health care provider.  2019 Elsevier/Gold Standard (2017-08-08 18:06:11)   Coronavirus (COVID-19) Are you at risk?  Are you at risk for the Coronavirus (COVID-19)?  To be considered HIGH RISK for Coronavirus  (COVID-19), you have to meet the following criteria:  . Traveled to [place], [place], [place], [place] or [place]; or in the [place] to [place], [place], [place], or [place]; and have fever, cough, and shortness of breath within the last 2 weeks of travel OR . Been in close contact with a person diagnosed with COVID-19 within the last 2 weeks and have fever, cough, and shortness of breath . IF YOU DO NOT MEET THESE CRITERIA, YOU ARE CONSIDERED LOW RISK FOR COVID-19.  What to do if you are HIGH RISK for COVID-19?  Marland Kitchen If you are having a medical emergency, call 911. . Seek medical care right away. Before you go to a doctor's office, urgent care or emergency department, call ahead and tell them about your recent travel, contact with someone diagnosed with COVID-19, and your symptoms. You should receive instructions from your physician's office regarding next steps of care.  . When you arrive at healthcare provider, tell the healthcare staff immediately you have returned from visiting [place], [place], [place], [place] or [place]; or traveled in the [place] to [place], [place], [place], or [place]; in  the last two weeks or you have been in close contact with a person diagnosed with COVID-19 in the last 2 weeks.   . Tell the health care staff about your symptoms: fever, cough and shortness of breath. . After you have been seen by a medical provider, you will be either: o Tested for (COVID-19) and discharged home on quarantine except to seek medical care if symptoms worsen, and asked to  - Stay home and avoid contact with others until you get your results (4-5 days)  - Avoid travel on public transportation if possible (such as bus, train, or airplane) or o Sent to the Emergency Department by EMS for evaluation, COVID-19 testing, and possible admission depending on your condition and test results.  What to do if you are LOW RISK for COVID-19?  Reduce your risk of any infection by  using the same precautions used for avoiding the common cold or flu:  Marland Kitchen Wash your hands often with soap and warm water for at least 20 seconds.  If soap and water are not readily available, use an alcohol-based hand sanitizer with at least 60% alcohol.  . If coughing or sneezing, cover your mouth and nose by coughing or sneezing into the elbow areas of your shirt or coat, into a tissue or into your sleeve (not your hands). . Avoid shaking hands with others and consider head nods or verbal greetings only. . Avoid touching your eyes, nose, or mouth with unwashed hands.  . Avoid close contact with people who are sick. . Avoid places or events with large numbers of people in one location, like concerts or sporting events. . Carefully consider travel plans you have or are making. . If you are planning any travel outside or inside the [place], visit the CDC's Travelers' Health webpage for the latest health notices. . If you have some symptoms but not all symptoms, continue to monitor at home and seek medical attention if your symptoms worsen. . If you are having a medical emergency, call 911.   [place] / e-Visit: [email or web address]         [facility]: [facility]: [phone]                   [facility]: [phone]

## 2018-10-12 LAB — CANCER ANTIGEN 19-9: CA 19-9: 389 U/mL — ABNORMAL HIGH (ref 0–35)

## 2018-10-14 ENCOUNTER — Telehealth: Payer: Self-pay | Admitting: Emergency Medicine

## 2018-10-14 ENCOUNTER — Telehealth: Payer: Self-pay | Admitting: *Deleted

## 2018-10-14 NOTE — Telephone Encounter (Signed)
No answer.  LVM asking pt to call back with any questions or concerns after recent infusion.

## 2018-10-14 NOTE — Telephone Encounter (Signed)
-----   Message from Royston Bake, RN sent at 10/11/2018  3:39 PM EDT ----- Regarding: Dr. Benay Spice first time Abraxane/gemzar Dr. Gearldine Shown patient first time abraxane/gemzar.

## 2018-10-15 ENCOUNTER — Telehealth: Payer: Self-pay | Admitting: *Deleted

## 2018-10-15 NOTE — Telephone Encounter (Signed)
FYI No new note. Opened in error.  F/U call completed by [facility].  See 10-14-2018 Phone encounter.

## 2018-10-16 ENCOUNTER — Telehealth: Payer: Self-pay | Admitting: *Deleted

## 2018-10-16 NOTE — Telephone Encounter (Addendum)
Left VM today and on 10/14/18 requesting return call regarding testing that has been ordered. 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  Return call on 10/17/18: He confirms no household income and that he does file taxes (has not done 2019 yet). He will bring in his 2018 tax return at next visit to accompany the patient assistance application.

## 2018-10-17 ENCOUNTER — Telehealth: Payer: Self-pay | Admitting: Oncology

## 2018-10-17 NOTE — Telephone Encounter (Signed)
Call day 6/4 - appointment cancelled. Old appointment patient now on active tx and has appointments 5/29. Left message for patient.

## 2018-10-21 ENCOUNTER — Other Ambulatory Visit: Payer: Self-pay | Admitting: Oncology

## 2018-10-25 ENCOUNTER — Encounter: Payer: Self-pay | Admitting: Nurse Practitioner

## 2018-10-25 ENCOUNTER — Inpatient Hospital Stay: Payer: Self-pay

## 2018-10-25 ENCOUNTER — Inpatient Hospital Stay (HOSPITAL_BASED_OUTPATIENT_CLINIC_OR_DEPARTMENT_OTHER): Payer: Self-pay | Admitting: Nurse Practitioner

## 2018-10-25 ENCOUNTER — Other Ambulatory Visit: Payer: Self-pay

## 2018-10-25 ENCOUNTER — Telehealth: Payer: Self-pay | Admitting: Nurse Practitioner

## 2018-10-25 VITALS — BP 129/74 | HR 62 | Temp 98.7°F | Resp 18 | Ht 66.0 in | Wt 206.7 lb

## 2018-10-25 DIAGNOSIS — C787 Secondary malignant neoplasm of liver and intrahepatic bile duct: Secondary | ICD-10-CM

## 2018-10-25 DIAGNOSIS — Z86010 Personal history of colonic polyps: Secondary | ICD-10-CM

## 2018-10-25 DIAGNOSIS — C252 Malignant neoplasm of tail of pancreas: Secondary | ICD-10-CM

## 2018-10-25 DIAGNOSIS — Z9049 Acquired absence of other specified parts of digestive tract: Secondary | ICD-10-CM

## 2018-10-25 DIAGNOSIS — C19 Malignant neoplasm of rectosigmoid junction: Secondary | ICD-10-CM

## 2018-10-25 DIAGNOSIS — R740 Nonspecific elevation of levels of transaminase and lactic acid dehydrogenase [LDH]: Secondary | ICD-10-CM

## 2018-10-25 DIAGNOSIS — Z933 Colostomy status: Secondary | ICD-10-CM

## 2018-10-25 DIAGNOSIS — R21 Rash and other nonspecific skin eruption: Secondary | ICD-10-CM

## 2018-10-25 DIAGNOSIS — Z95828 Presence of other vascular implants and grafts: Secondary | ICD-10-CM

## 2018-10-25 LAB — CBC WITH DIFFERENTIAL (CANCER CENTER ONLY)
Abs Immature Granulocytes: 0.04 10*3/uL (ref 0.00–0.07)
Basophils Absolute: 0 10*3/uL (ref 0.0–0.1)
Basophils Relative: 1 %
Eosinophils Absolute: 0.2 10*3/uL (ref 0.0–0.5)
Eosinophils Relative: 4 %
HCT: 43.8 % (ref 39.0–52.0)
Hemoglobin: 13.9 g/dL (ref 13.0–17.0)
Immature Granulocytes: 1 %
Lymphocytes Relative: 31 %
Lymphs Abs: 1.5 10*3/uL (ref 0.7–4.0)
MCH: 28.8 pg (ref 26.0–34.0)
MCHC: 31.7 g/dL (ref 30.0–36.0)
MCV: 90.7 fL (ref 80.0–100.0)
Monocytes Absolute: 0.6 10*3/uL (ref 0.1–1.0)
Monocytes Relative: 12 %
Neutro Abs: 2.5 10*3/uL (ref 1.7–7.7)
Neutrophils Relative %: 51 %
Platelet Count: 223 10*3/uL (ref 150–400)
RBC: 4.83 MIL/uL (ref 4.22–5.81)
RDW: 13.2 % (ref 11.5–15.5)
WBC Count: 4.9 10*3/uL (ref 4.0–10.5)
nRBC: 0 % (ref 0.0–0.2)

## 2018-10-25 LAB — CMP (CANCER CENTER ONLY)
ALT: 87 U/L — ABNORMAL HIGH (ref 0–44)
AST: 65 U/L — ABNORMAL HIGH (ref 15–41)
Albumin: 3.9 g/dL (ref 3.5–5.0)
Alkaline Phosphatase: 56 U/L (ref 38–126)
Anion gap: 8 (ref 5–15)
BUN: 16 mg/dL (ref 8–23)
CO2: 29 mmol/L (ref 22–32)
Calcium: 8.8 mg/dL — ABNORMAL LOW (ref 8.9–10.3)
Chloride: 103 mmol/L (ref 98–111)
Creatinine: 0.94 mg/dL (ref 0.61–1.24)
GFR, Est AFR Am: >60 mL/min (ref >60)
GFR, Estimated: >60 mL/min (ref >60)
Glucose, Bld: 122 mg/dL — ABNORMAL HIGH (ref 70–99)
Potassium: 3.8 mmol/L (ref 3.5–5.1)
Sodium: 140 mmol/L (ref 135–145)
Total Bilirubin: 0.5 mg/dL (ref 0.3–1.2)
Total Protein: 7.2 g/dL (ref 6.5–8.1)

## 2018-10-25 MED ORDER — SODIUM CHLORIDE 0.9% FLUSH
10.0000 mL | INTRAVENOUS | Status: DC | PRN
  Administered 2018-10-25: 10 mL
  Filled 2018-10-25: qty 10

## 2018-10-25 MED ORDER — PROCHLORPERAZINE MALEATE 10 MG PO TABS
ORAL_TABLET | ORAL | Status: AC
  Filled 2018-10-25: qty 1

## 2018-10-25 MED ORDER — SODIUM CHLORIDE 0.9 % IV SOLN
Freq: Once | INTRAVENOUS | Status: AC
  Administered 2018-10-25: 12:00:00 via INTRAVENOUS
  Filled 2018-10-25: qty 250

## 2018-10-25 MED ORDER — PROCHLORPERAZINE MALEATE 10 MG PO TABS
10.0000 mg | ORAL_TABLET | Freq: Once | ORAL | Status: AC
  Administered 2018-10-25: 12:00:00 10 mg via ORAL

## 2018-10-25 MED ORDER — PACLITAXEL PROTEIN-BOUND CHEMO INJECTION 100 MG
125.0000 mg/m2 | Freq: Once | INTRAVENOUS | Status: AC
  Administered 2018-10-25: 15:00:00 250 mg via INTRAVENOUS
  Filled 2018-10-25: qty 50

## 2018-10-25 MED ORDER — SODIUM CHLORIDE 0.9% FLUSH
10.0000 mL | INTRAVENOUS | Status: DC | PRN
  Administered 2018-10-25: 11:00:00 10 mL
  Filled 2018-10-25: qty 10

## 2018-10-25 MED ORDER — SODIUM CHLORIDE 0.9 % IV SOLN
2000.0000 mg | Freq: Once | INTRAVENOUS | Status: AC
  Administered 2018-10-25: 15:00:00 2000 mg via INTRAVENOUS
  Filled 2018-10-25: qty 52.6

## 2018-10-25 MED ORDER — HEPARIN SOD (PORK) LOCK FLUSH 100 UNIT/ML IV SOLN
500.0000 [IU] | Freq: Once | INTRAVENOUS | Status: AC | PRN
  Administered 2018-10-25: 16:00:00 500 [IU]
  Filled 2018-10-25: qty 5

## 2018-10-25 NOTE — Progress Notes (Signed)
Spoke w/ Alex [name], patient had mild rash following first infusion that went away pretty quickly without intervention. No additional premeds at this time, the patient knows to call if it happens again. They may consider adding loratidine as premed in the future if rash happens again.   Demetrius Charity, PharmD, [facility] Pharmacist Pharmacy Phone: [phone] 10/25/2018

## 2018-10-25 NOTE — Progress Notes (Signed)
Per NP, Patient is ok to treat today with elevated liver enzymes.

## 2018-10-25 NOTE — Patient Instructions (Signed)
[facility] Discharge Instructions for Patients Receiving Chemotherapy  Today you received the following chemotherapy agents: Abraxane and Gemzar   To help prevent nausea and vomiting after your treatment, we encourage you to take your nausea medication as directed.    If you develop nausea and vomiting that is not controlled by your nausea medication, call the clinic.   BELOW ARE SYMPTOMS THAT SHOULD BE REPORTED IMMEDIATELY:  *FEVER GREATER THAN 100.5 F  *CHILLS WITH OR WITHOUT FEVER  NAUSEA AND VOMITING THAT IS NOT CONTROLLED WITH YOUR NAUSEA MEDICATION  *UNUSUAL SHORTNESS OF BREATH  *UNUSUAL BRUISING OR BLEEDING  TENDERNESS IN MOUTH AND THROAT WITH OR WITHOUT PRESENCE OF ULCERS  *URINARY PROBLEMS  *BOWEL PROBLEMS  UNUSUAL RASH Items with * indicate a potential emergency and should be followed up as soon as possible.  Feel free to call the clinic should you have any questions or concerns. The clinic phone number is [phone].  Please show the CHEMO ALERT CARD at check-in to the Emergency Department and triage nurse.   

## 2018-10-25 NOTE — Progress Notes (Signed)
  [facility] OFFICE PROGRESS NOTE   Diagnosis: Pancreas cancer, colon cancer  INTERVAL HISTORY:   Mr. Borawski returns as scheduled.  He completed cycle 1 gemcitabine/Abraxane 10/11/2018.  He had mild nausea on day 1.  He felt "tired" for a few days.  No mouth sores.  No diarrhea or constipation.  About 2 days after the chemo he noted a pruritic erythematous rash at the right abdomen and left upper leg.  The rash resolved after about 1-1/2 to 2 days.  No fever, cough, shortness of breath.  Objective:  Vital signs in last 24 hours:  Blood pressure 129/74, pulse 62, temperature 98.7 F (37.1 C), temperature source Oral, resp. rate 18, height _0  (1.676 m), weight 206 lb 11.2 oz (93.8 kg), SpO2 100 %.    Vascular: No leg edema. Skin: Faint erythematous rash at the right abdomen.  Appears to be resolving. Port-A-Cath without erythema.  Lab Results:  Lab Results  Component Value Date   WBC 4.9 10/25/2018   HGB 13.9 10/25/2018   HCT 43.8 10/25/2018   MCV 90.7 10/25/2018   PLT 223 10/25/2018   NEUTROABS 2.5 10/25/2018    Imaging:  No results found.  Medications: I have reviewed the patient's current medications.  Assessment/Plan: 1. 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 Well-differentiated adenocarcinoma, 0/13 lymph nodes positive, MSI-stable, no loss of mismatch repair protein expression ? Tumor involving the rectosigmoid junction ? Sigmoidoscopy 09/18/2017- distal sigmoid colon tumor beginning between 15 and 20 cm from the dentate line, completely obstructing ? CT abdomen/pelvis 09/14/2017-sigmoid thickening no evidence of metastatic disease ? 2 mm right upper lobe nodule on CT 08/04/2017 ? Colonoscopy 02/07/2018- polyps removed from the cecum, ascending colon, transverse colon, descending colon, rectosigmoid colon and rectum (tubular adenomas)  2. Right lung pneumonia/empyema  March 2019  Right VATS, drainage of empyema, and decortication of the right lower lobe 08/31/2017  3.CVA in 2010  4.Rectal and active sigmoid polyps noted on flexible sigmoidoscopy 09/18/2017  5.Pancreas tail mass/right liver lesions and 4 mm splenic lesion on CT abdomen/pelvis 05/27/2018  MRI abdomen 07/27/2018- right liver lesions, rim-enhancing lesion in the pancreas tail  Ultrasound-guided biopsy of a right liver lesion 08/14/2018-metastatic adenocarcinoma, cytokeratin 7+, CDX-2 weak positive, cytokeratin 20 negative, consistent with metastatic pancreas cancer; MSI stable; tumor mutational burden 0  CT abdomen/pelvis 09/26/2018- increased size of previously noted liver lesions, new right liver lesion, 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  Port-A-Cath placement by Dr. Kae Heller 09/20/2018   Disposition: Mr. Groeneveld appears stable.  He has completed 1 cycle of gemcitabine/Abraxane.  Overall he tolerated well.  Around day 2 he developed a pruritic erythematous rash at the right abdomen and left upper leg.  This may be related to gemcitabine.  The rash improved without intervention.  If the rash recurs after cycle 2 he will try Benadryl and contact the office.  We reviewed the labs from today.  Counts are adequate for treatment.  Transaminases are mildly elevated.  We will continue to monitor.  He will return for lab, follow-up, cycle 3 gemcitabine/Abraxane in 2 weeks.  He will contact the office in the interim as outlined above or with any other problems.  Plan reviewed with Dr. Benay Spice.    Ned Card ANP/GNP-BC   10/25/2018  11:14 AM

## 2018-10-25 NOTE — Telephone Encounter (Signed)
Scheduled appt per 5/29 los. °

## 2018-10-29 ENCOUNTER — Other Ambulatory Visit: Payer: Self-pay | Admitting: Pharmacist

## 2018-10-31 ENCOUNTER — Other Ambulatory Visit: Payer: Self-pay

## 2018-10-31 ENCOUNTER — Ambulatory Visit: Payer: Self-pay | Admitting: Oncology

## 2018-11-03 ENCOUNTER — Other Ambulatory Visit: Payer: Self-pay | Admitting: Oncology

## 2018-11-08 ENCOUNTER — Inpatient Hospital Stay: Payer: Self-pay | Attending: Nurse Practitioner

## 2018-11-08 ENCOUNTER — Other Ambulatory Visit: Payer: Self-pay

## 2018-11-08 ENCOUNTER — Other Ambulatory Visit: Payer: Self-pay | Admitting: Nurse Practitioner

## 2018-11-08 ENCOUNTER — Inpatient Hospital Stay: Payer: Self-pay

## 2018-11-08 ENCOUNTER — Inpatient Hospital Stay (HOSPITAL_BASED_OUTPATIENT_CLINIC_OR_DEPARTMENT_OTHER): Payer: Self-pay | Admitting: Oncology

## 2018-11-08 VITALS — BP 124/83 | HR 58 | Temp 98.5°F | Resp 17 | Ht 66.0 in | Wt 205.4 lb

## 2018-11-08 DIAGNOSIS — C787 Secondary malignant neoplasm of liver and intrahepatic bile duct: Secondary | ICD-10-CM | POA: Insufficient documentation

## 2018-11-08 DIAGNOSIS — Z86010 Personal history of colonic polyps: Secondary | ICD-10-CM

## 2018-11-08 DIAGNOSIS — Z9049 Acquired absence of other specified parts of digestive tract: Secondary | ICD-10-CM

## 2018-11-08 DIAGNOSIS — C252 Malignant neoplasm of tail of pancreas: Secondary | ICD-10-CM

## 2018-11-08 DIAGNOSIS — Z95828 Presence of other vascular implants and grafts: Secondary | ICD-10-CM

## 2018-11-08 DIAGNOSIS — Z5111 Encounter for antineoplastic chemotherapy: Secondary | ICD-10-CM | POA: Insufficient documentation

## 2018-11-08 DIAGNOSIS — C19 Malignant neoplasm of rectosigmoid junction: Secondary | ICD-10-CM

## 2018-11-08 DIAGNOSIS — C187 Malignant neoplasm of sigmoid colon: Secondary | ICD-10-CM

## 2018-11-08 DIAGNOSIS — Z933 Colostomy status: Secondary | ICD-10-CM | POA: Insufficient documentation

## 2018-11-08 LAB — CMP (CANCER CENTER ONLY)
ALT: 61 U/L — ABNORMAL HIGH (ref 0–44)
AST: 45 U/L — ABNORMAL HIGH (ref 15–41)
Albumin: 4 g/dL (ref 3.5–5.0)
Alkaline Phosphatase: 56 U/L (ref 38–126)
Anion gap: 10 (ref 5–15)
BUN: 15 mg/dL (ref 8–23)
CO2: 27 mmol/L (ref 22–32)
Calcium: 8.7 mg/dL — ABNORMAL LOW (ref 8.9–10.3)
Chloride: 103 mmol/L (ref 98–111)
Creatinine: 0.99 mg/dL (ref 0.61–1.24)
GFR, Est AFR Am: >60 mL/min (ref >60)
GFR, Estimated: >60 mL/min (ref >60)
Glucose, Bld: 111 mg/dL — ABNORMAL HIGH (ref 70–99)
Potassium: 3.8 mmol/L (ref 3.5–5.1)
Sodium: 140 mmol/L (ref 135–145)
Total Bilirubin: 0.5 mg/dL (ref 0.3–1.2)
Total Protein: 7.2 g/dL (ref 6.5–8.1)

## 2018-11-08 LAB — CBC WITH DIFFERENTIAL (CANCER CENTER ONLY)
Abs Immature Granulocytes: 0.04 K/uL (ref 0.00–0.07)
Basophils Absolute: 0 K/uL (ref 0.0–0.1)
Basophils Relative: 1 %
Eosinophils Absolute: 0.4 K/uL (ref 0.0–0.5)
Eosinophils Relative: 6 %
HCT: 42.5 % (ref 39.0–52.0)
Hemoglobin: 13.5 g/dL (ref 13.0–17.0)
Immature Granulocytes: 1 %
Lymphocytes Relative: 21 %
Lymphs Abs: 1.3 K/uL (ref 0.7–4.0)
MCH: 28.8 pg (ref 26.0–34.0)
MCHC: 31.8 g/dL (ref 30.0–36.0)
MCV: 90.8 fL (ref 80.0–100.0)
Monocytes Absolute: 0.9 K/uL (ref 0.1–1.0)
Monocytes Relative: 15 %
Neutro Abs: 3.4 K/uL (ref 1.7–7.7)
Neutrophils Relative %: 56 %
Platelet Count: 151 K/uL (ref 150–400)
RBC: 4.68 MIL/uL (ref 4.22–5.81)
RDW: 13.7 % (ref 11.5–15.5)
WBC Count: 5.9 K/uL (ref 4.0–10.5)
nRBC: 0 % (ref 0.0–0.2)

## 2018-11-08 MED ORDER — HEPARIN SOD (PORK) LOCK FLUSH 100 UNIT/ML IV SOLN
500.0000 [IU] | Freq: Once | INTRAVENOUS | Status: AC | PRN
  Administered 2018-11-08: 500 [IU]
  Filled 2018-11-08: qty 5

## 2018-11-08 MED ORDER — SODIUM CHLORIDE 0.9% FLUSH
10.0000 mL | INTRAVENOUS | Status: DC | PRN
  Administered 2018-11-08: 10 mL
  Filled 2018-11-08: qty 10

## 2018-11-08 MED ORDER — PACLITAXEL PROTEIN-BOUND CHEMO INJECTION 100 MG
125.0000 mg/m2 | Freq: Once | INTRAVENOUS | Status: AC
  Administered 2018-11-08: 14:00:00 250 mg via INTRAVENOUS
  Filled 2018-11-08: qty 50

## 2018-11-08 MED ORDER — PROCHLORPERAZINE MALEATE 10 MG PO TABS
10.0000 mg | ORAL_TABLET | Freq: Once | ORAL | Status: AC
  Administered 2018-11-08: 10 mg via ORAL

## 2018-11-08 MED ORDER — PROCHLORPERAZINE MALEATE 10 MG PO TABS
ORAL_TABLET | ORAL | Status: AC
  Filled 2018-11-08: qty 1

## 2018-11-08 MED ORDER — SODIUM CHLORIDE 0.9 % IV SOLN
2000.0000 mg | Freq: Once | INTRAVENOUS | Status: AC
  Administered 2018-11-08: 2000 mg via INTRAVENOUS
  Filled 2018-11-08: qty 52.6

## 2018-11-08 MED ORDER — SODIUM CHLORIDE 0.9 % IV SOLN
Freq: Once | INTRAVENOUS | Status: AC
  Administered 2018-11-08: 13:00:00 via INTRAVENOUS
  Filled 2018-11-08: qty 250

## 2018-11-08 NOTE — Patient Instructions (Signed)
[facility] Discharge Instructions for Patients Receiving Chemotherapy  Today you received the following chemotherapy agents: Abraxane, Gemzar   To help prevent nausea and vomiting after your treatment, we encourage you to take your nausea medication as directed.    If you develop nausea and vomiting that is not controlled by your nausea medication, call the clinic.   BELOW ARE SYMPTOMS THAT SHOULD BE REPORTED IMMEDIATELY:  *FEVER GREATER THAN 100.5 F  *CHILLS WITH OR WITHOUT FEVER  NAUSEA AND VOMITING THAT IS NOT CONTROLLED WITH YOUR NAUSEA MEDICATION  *UNUSUAL SHORTNESS OF BREATH  *UNUSUAL BRUISING OR BLEEDING  TENDERNESS IN MOUTH AND THROAT WITH OR WITHOUT PRESENCE OF ULCERS  *URINARY PROBLEMS  *BOWEL PROBLEMS  UNUSUAL RASH Items with * indicate a potential emergency and should be followed up as soon as possible.  Feel free to call the clinic should you have any questions or concerns. The clinic phone number is [phone].  Please show the CHEMO ALERT CARD at check-in to the Emergency Department and triage nurse.   

## 2018-11-08 NOTE — Progress Notes (Signed)
  [place] OFFICE PROGRESS NOTE   Diagnosis: Pancreas cancer  INTERVAL HISTORY:   Alex [name] complete another treatment with gemcitabine/Abraxane on 10/25/2018.  He tolerated treatment well.  No neuropathy symptoms.  No rash.  Good appetite.  No pain.  Objective:  Vital signs in last 24 hours:  Blood pressure 124/83, pulse (!) 58, temperature 98.5 F (36.9 C), temperature source Oral, resp. rate 17, height '5\' 6"'$  (1.676 m), weight 205 lb 6.4 oz (93.2 kg), SpO2 99 %.    HEENT: No thrush GI: No hepatomegaly, no mass, nontender Vascular: No leg edema    Portacath/PICC-without erythema  Lab Results:  Lab Results  Component Value Date   WBC 5.9 11/08/2018   HGB 13.5 11/08/2018   HCT 42.5 11/08/2018   MCV 90.8 11/08/2018   PLT 151 11/08/2018   NEUTROABS 3.4 11/08/2018    CMP  Lab Results  Component Value Date   NA 140 11/08/2018   K 3.8 11/08/2018   CL 103 11/08/2018   CO2 27 11/08/2018   GLUCOSE 111 (H) 11/08/2018   BUN 15 11/08/2018   CREATININE 0.99 11/08/2018   CALCIUM 8.7 (L) 11/08/2018   PROT 7.2 11/08/2018   ALBUMIN 4.0 11/08/2018   AST 45 (H) 11/08/2018   ALT 61 (H) 11/08/2018   ALKPHOS 56 11/08/2018   BILITOT 0.5 11/08/2018   GFRNONAA >60 11/08/2018   GFRAA >60 11/08/2018     Medications: I have reviewed the patient's current medications.   Assessment/Plan:  1. 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 Well-differentiated adenocarcinoma, 0/13 lymph nodes positive, MSI-stable, no loss of mismatch repair protein expression ? Tumor involving the rectosigmoid junction ? Sigmoidoscopy 09/18/2017- distal sigmoid colon tumor beginning between 15 and 20 cm from the dentate line, completely obstructing ? CT abdomen/pelvis 09/14/2017-sigmoid thickening no evidence of metastatic disease ? 2 mm right upper lobe nodule on CT 08/04/2017 ? Colonoscopy 02/07/2018- polyps  removed from the cecum, ascending colon, transverse colon, descending colon, rectosigmoid colon and rectum (tubular adenomas)  2. Right lung pneumonia/empyema March 2019  Right VATS, drainage of empyema, and decortication of the right lower lobe 08/31/2017  3.CVA in 2010  4.Rectal and active sigmoid polyps noted on flexible sigmoidoscopy 09/18/2017  5.Pancreas tail mass/right liver lesions and 4 mm splenic lesion on CT abdomen/pelvis 05/27/2018  MRI abdomen 07/27/2018- right liver lesions, rim-enhancing lesion in the pancreas tail  Ultrasound-guided biopsy of a right liver lesion 08/14/2018-metastatic adenocarcinoma, cytokeratin 7+, CDX-2 weak positive, cytokeratin 20 negative, consistent with metastatic pancreas cancer; MSI stable; tumor mutational burden 0  CT abdomen/pelvis 09/26/2018- increased size of previously noted liver lesions, new right liver lesion, 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  Port-A-Cath placement by Dr. Kae Heller 09/20/2018    Disposition: Alex [name] appears stable.  He is tolerating the gemcitabine/Abraxane well.  He will complete another cycle today.  We will follow-up on the CA 19-9 from today.  He will return for an office visit and chemotherapy in 2 weeks.  Betsy Coder, MD  11/08/2018  12:34 PM

## 2018-11-09 LAB — CANCER ANTIGEN 19-9: CA 19-9: 78 U/mL — ABNORMAL HIGH (ref 0–35)

## 2018-11-11 ENCOUNTER — Telehealth: Payer: Self-pay | Admitting: Oncology

## 2018-11-11 NOTE — Telephone Encounter (Signed)
Scheduled appt per 6/12 los.

## 2018-11-16 ENCOUNTER — Other Ambulatory Visit: Payer: Self-pay | Admitting: Oncology

## 2018-11-22 ENCOUNTER — Inpatient Hospital Stay: Payer: Self-pay

## 2018-11-22 ENCOUNTER — Encounter: Payer: Self-pay | Admitting: Nurse Practitioner

## 2018-11-22 ENCOUNTER — Other Ambulatory Visit: Payer: Self-pay

## 2018-11-22 ENCOUNTER — Inpatient Hospital Stay (HOSPITAL_BASED_OUTPATIENT_CLINIC_OR_DEPARTMENT_OTHER): Payer: Self-pay | Admitting: Nurse Practitioner

## 2018-11-22 VITALS — BP 121/72 | HR 56 | Temp 97.9°F | Resp 18 | Ht 66.0 in | Wt 204.5 lb

## 2018-11-22 DIAGNOSIS — C252 Malignant neoplasm of tail of pancreas: Secondary | ICD-10-CM

## 2018-11-22 DIAGNOSIS — Z95828 Presence of other vascular implants and grafts: Secondary | ICD-10-CM

## 2018-11-22 LAB — CMP (CANCER CENTER ONLY)
ALT: 59 U/L — ABNORMAL HIGH (ref 0–44)
AST: 48 U/L — ABNORMAL HIGH (ref 15–41)
Albumin: 4.4 g/dL (ref 3.5–5.0)
Alkaline Phosphatase: 56 U/L (ref 38–126)
Anion gap: 11 (ref 5–15)
BUN: 14 mg/dL (ref 8–23)
CO2: 28 mmol/L (ref 22–32)
Calcium: 9.4 mg/dL (ref 8.9–10.3)
Chloride: 101 mmol/L (ref 98–111)
Creatinine: 1.02 mg/dL (ref 0.61–1.24)
GFR, Est AFR Am: >60 mL/min (ref >60)
GFR, Estimated: >60 mL/min (ref >60)
Glucose, Bld: 94 mg/dL (ref 70–99)
Potassium: 3.8 mmol/L (ref 3.5–5.1)
Sodium: 140 mmol/L (ref 135–145)
Total Bilirubin: 0.6 mg/dL (ref 0.3–1.2)
Total Protein: 7.7 g/dL (ref 6.5–8.1)

## 2018-11-22 LAB — CBC WITH DIFFERENTIAL (CANCER CENTER ONLY)
Abs Immature Granulocytes: 0.03 10*3/uL (ref 0.00–0.07)
Basophils Absolute: 0 10*3/uL (ref 0.0–0.1)
Basophils Relative: 1 %
Eosinophils Absolute: 0.2 10*3/uL (ref 0.0–0.5)
Eosinophils Relative: 4 %
HCT: 43.7 % (ref 39.0–52.0)
Hemoglobin: 14 g/dL (ref 13.0–17.0)
Immature Granulocytes: 1 %
Lymphocytes Relative: 23 %
Lymphs Abs: 1.3 10*3/uL (ref 0.7–4.0)
MCH: 28.6 pg (ref 26.0–34.0)
MCHC: 32 g/dL (ref 30.0–36.0)
MCV: 89.4 fL (ref 80.0–100.0)
Monocytes Absolute: 1.1 10*3/uL — ABNORMAL HIGH (ref 0.1–1.0)
Monocytes Relative: 19 %
Neutro Abs: 3.1 10*3/uL (ref 1.7–7.7)
Neutrophils Relative %: 52 %
Platelet Count: 173 10*3/uL (ref 150–400)
RBC: 4.89 MIL/uL (ref 4.22–5.81)
RDW: 13.9 % (ref 11.5–15.5)
WBC Count: 5.7 10*3/uL (ref 4.0–10.5)
nRBC: 0 % (ref 0.0–0.2)

## 2018-11-22 MED ORDER — SODIUM CHLORIDE 0.9 % IV SOLN
Freq: Once | INTRAVENOUS | Status: AC
  Administered 2018-11-22: 13:00:00 via INTRAVENOUS
  Filled 2018-11-22: qty 250

## 2018-11-22 MED ORDER — SODIUM CHLORIDE 0.9 % IV SOLN
2000.0000 mg | Freq: Once | INTRAVENOUS | Status: AC
  Administered 2018-11-22: 2000 mg via INTRAVENOUS
  Filled 2018-11-22: qty 52.6

## 2018-11-22 MED ORDER — PROCHLORPERAZINE MALEATE 10 MG PO TABS
10.0000 mg | ORAL_TABLET | Freq: Once | ORAL | Status: AC
  Administered 2018-11-22: 13:00:00 10 mg via ORAL

## 2018-11-22 MED ORDER — PROCHLORPERAZINE MALEATE 10 MG PO TABS
ORAL_TABLET | ORAL | Status: AC
  Filled 2018-11-22: qty 1

## 2018-11-22 MED ORDER — PACLITAXEL PROTEIN-BOUND CHEMO INJECTION 100 MG
125.0000 mg/m2 | Freq: Once | INTRAVENOUS | Status: AC
  Administered 2018-11-22: 250 mg via INTRAVENOUS
  Filled 2018-11-22: qty 50

## 2018-11-22 MED ORDER — HEPARIN SOD (PORK) LOCK FLUSH 100 UNIT/ML IV SOLN
500.0000 [IU] | Freq: Once | INTRAVENOUS | Status: DC | PRN
  Filled 2018-11-22: qty 5

## 2018-11-22 MED ORDER — SODIUM CHLORIDE 0.9% FLUSH
10.0000 mL | INTRAVENOUS | Status: DC | PRN
  Administered 2018-11-22: 10 mL
  Filled 2018-11-22: qty 10

## 2018-11-22 MED ORDER — SODIUM CHLORIDE 0.9% FLUSH
10.0000 mL | INTRAVENOUS | Status: DC | PRN
  Filled 2018-11-22: qty 10

## 2018-11-22 NOTE — Patient Instructions (Signed)
[facility] Discharge Instructions for Patients Receiving Chemotherapy  Today you received the following chemotherapy agents: Abraxane and Gemzar   To help prevent nausea and vomiting after your treatment, we encourage you to take your nausea medication as directed.    If you develop nausea and vomiting that is not controlled by your nausea medication, call the clinic.   BELOW ARE SYMPTOMS THAT SHOULD BE REPORTED IMMEDIATELY:  *FEVER GREATER THAN 100.5 F  *CHILLS WITH OR WITHOUT FEVER  NAUSEA AND VOMITING THAT IS NOT CONTROLLED WITH YOUR NAUSEA MEDICATION  *UNUSUAL SHORTNESS OF BREATH  *UNUSUAL BRUISING OR BLEEDING  TENDERNESS IN MOUTH AND THROAT WITH OR WITHOUT PRESENCE OF ULCERS  *URINARY PROBLEMS  *BOWEL PROBLEMS  UNUSUAL RASH Items with * indicate a potential emergency and should be followed up as soon as possible.  Feel free to call the clinic should you have any questions or concerns. The clinic phone number is [phone].  Please show the CHEMO ALERT CARD at check-in to the Emergency Department and triage nurse.   

## 2018-11-22 NOTE — Progress Notes (Signed)
  [place] OFFICE PROGRESS NOTE   Diagnosis: Pancreas cancer  INTERVAL HISTORY:   Mr. Barretta returns as scheduled.  He completed cycle 3 gemcitabine/Abraxane 11/08/2018.  He denies nausea/vomiting.  No mouth sores.  No diarrhea.  No fever or rash following treatment.  No numbness or tingling in the hands or feet.  He denies abdominal pain.  Objective:  Vital signs in last 24 hours:  Blood pressure 121/72, pulse (!) 56, temperature 97.9 F (36.6 C), temperature source Oral, resp. rate 18, height '5\' 6"'$  (1.676 m), weight 204 lb 8 oz (92.8 kg), SpO2 98 %.    HEENT: No thrush or ulcers. GI: Abdomen nontender.  No hepatomegaly.  Left lower quadrant colostomy. Vascular: No leg edema. Skin: No rash. Port-A-Cath without erythema.   Lab Results:  Lab Results  Component Value Date   WBC 5.7 11/22/2018   HGB 14.0 11/22/2018   HCT 43.7 11/22/2018   MCV 89.4 11/22/2018   PLT 173 11/22/2018   NEUTROABS 3.1 11/22/2018    Imaging:  No results found.  Medications: I have reviewed the patient's current medications.  Assessment/Plan: 1. 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 Well-differentiated adenocarcinoma, 0/13 lymph nodes positive, MSI-stable, no loss of mismatch repair protein expression ? Tumor involving the rectosigmoid junction ? Sigmoidoscopy 09/18/2017- distal sigmoid colon tumor beginning between 15 and 20 cm from the dentate line, completely obstructing ? CT abdomen/pelvis 09/14/2017-sigmoid thickening no evidence of metastatic disease ? 2 mm right upper lobe nodule on CT 08/04/2017 ? Colonoscopy 02/07/2018- polyps removed from the cecum, ascending colon, transverse colon, descending colon, rectosigmoid colon and rectum (tubular adenomas)  2. Right lung pneumonia/empyema March 2019  Right VATS, drainage of empyema, and decortication of the right lower lobe  08/31/2017  3.CVA in 2010  4.Rectal and active sigmoid polyps noted on flexible sigmoidoscopy 09/18/2017  5.Pancreas tail mass/right liver lesions and 4 mm splenic lesion on CT abdomen/pelvis 05/27/2018  MRI abdomen 07/27/2018- right liver lesions, rim-enhancing lesion in the pancreas tail  Ultrasound-guided biopsy of a right liver lesion 08/14/2018-metastatic adenocarcinoma, cytokeratin 7+, CDX-2 weak positive, cytokeratin 20 negative, consistent with metastatic pancreas cancer; MSI stable; tumor mutational burden 0  CT abdomen/pelvis 09/26/2018- increased size of previously noted liver lesions, new right liver lesion, increased size of pancreas 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 Connor4/24/2020   Disposition: Mr. Onstott appears stable.  He has completed 3 cycles of gemcitabine/Abraxane.  He is tolerating treatment well.  Most recent tumor marker was improved.  Plan to proceed with cycle 4 today as scheduled.  He will be referred for restaging CTs after completing 5 cycles.  We reviewed the CBC and chemistry panel from today.  Adequate for treatment.  He will return for lab, follow-up, cycle 5 gemcitabine/Abraxane in 2 weeks.    Ned Card ANP/GNP-BC   11/22/2018  12:20 PM

## 2018-11-23 LAB — CANCER ANTIGEN 19-9: CA 19-9: 34 U/mL (ref 0–35)

## 2018-11-26 ENCOUNTER — Telehealth: Payer: Self-pay | Admitting: Nurse Practitioner

## 2018-11-26 ENCOUNTER — Encounter: Payer: Self-pay | Admitting: Pharmacy Technician

## 2018-11-26 NOTE — Telephone Encounter (Signed)
Scheduled per los. Mailed printout  °

## 2018-11-26 NOTE — Progress Notes (Signed)
The patient is approved for drug assistance by [employer] for Opdivo.  Enrollment is effective until 05/29/19 and is based on self pay.  Drug replacement will begin on DOS 12/06/18.

## 2018-12-01 ENCOUNTER — Other Ambulatory Visit: Payer: Self-pay | Admitting: Oncology

## 2018-12-04 ENCOUNTER — Telehealth: Payer: Self-pay | Admitting: *Deleted

## 2018-12-04 NOTE — Telephone Encounter (Signed)
-----   Message from Jerene Bears, MD sent at 12/04/2018 10:25 AM EDT ----- Regarding: FW: Colonoscopy question See note from Julieanne Manson, Can cancel recall colonoscopy for now. Thanks JMP ----- Message ----- From: Ladell Pier, MD Sent: 12/04/2018   7:09 AM EDT To: Jerene Bears, MD Subject: RE: Colonoscopy question                       No need for colonoscopy Thanks ----- Message ----- From: Jerene Bears, MD Sent: 12/03/2018   7:52 PM EDT To: Ladell Pier, MD Subject: Colonoscopy question                           Hi Alex [name] Alex [name] name came up for colonoscopy surveillance given hx of colon cancer. I know see that, unfortunately, he is dealing with metastatic pancreas cancer and receiving therapy.  I assume I should not perform or at least hold on surveillance colonoscopy for now, but would appreciate your opinion. Thanks Alex [name]

## 2018-12-06 ENCOUNTER — Inpatient Hospital Stay (HOSPITAL_BASED_OUTPATIENT_CLINIC_OR_DEPARTMENT_OTHER): Payer: Self-pay | Admitting: Nurse Practitioner

## 2018-12-06 ENCOUNTER — Encounter: Payer: Self-pay | Admitting: Nurse Practitioner

## 2018-12-06 ENCOUNTER — Inpatient Hospital Stay: Payer: Self-pay

## 2018-12-06 ENCOUNTER — Other Ambulatory Visit: Payer: Self-pay

## 2018-12-06 ENCOUNTER — Inpatient Hospital Stay: Payer: Self-pay | Attending: Nurse Practitioner

## 2018-12-06 VITALS — BP 132/72 | HR 62 | Temp 98.7°F | Resp 17 | Ht 66.0 in | Wt 205.6 lb

## 2018-12-06 DIAGNOSIS — C19 Malignant neoplasm of rectosigmoid junction: Secondary | ICD-10-CM

## 2018-12-06 DIAGNOSIS — C252 Malignant neoplasm of tail of pancreas: Secondary | ICD-10-CM

## 2018-12-06 DIAGNOSIS — C787 Secondary malignant neoplasm of liver and intrahepatic bile duct: Secondary | ICD-10-CM

## 2018-12-06 DIAGNOSIS — Z8673 Personal history of transient ischemic attack (TIA), and cerebral infarction without residual deficits: Secondary | ICD-10-CM | POA: Insufficient documentation

## 2018-12-06 DIAGNOSIS — Z5111 Encounter for antineoplastic chemotherapy: Secondary | ICD-10-CM | POA: Insufficient documentation

## 2018-12-06 DIAGNOSIS — Z95828 Presence of other vascular implants and grafts: Secondary | ICD-10-CM

## 2018-12-06 LAB — CBC WITH DIFFERENTIAL (CANCER CENTER ONLY)
Abs Immature Granulocytes: 0.04 10*3/uL (ref 0.00–0.07)
Basophils Absolute: 0 10*3/uL (ref 0.0–0.1)
Basophils Relative: 1 %
Eosinophils Absolute: 0.2 10*3/uL (ref 0.0–0.5)
Eosinophils Relative: 4 %
HCT: 41 % (ref 39.0–52.0)
Hemoglobin: 13.1 g/dL (ref 13.0–17.0)
Immature Granulocytes: 1 %
Lymphocytes Relative: 22 %
Lymphs Abs: 1.2 10*3/uL (ref 0.7–4.0)
MCH: 28.6 pg (ref 26.0–34.0)
MCHC: 32 g/dL (ref 30.0–36.0)
MCV: 89.5 fL (ref 80.0–100.0)
Monocytes Absolute: 1.2 10*3/uL — ABNORMAL HIGH (ref 0.1–1.0)
Monocytes Relative: 21 %
Neutro Abs: 2.9 10*3/uL (ref 1.7–7.7)
Neutrophils Relative %: 51 %
Platelet Count: 172 10*3/uL (ref 150–400)
RBC: 4.58 MIL/uL (ref 4.22–5.81)
RDW: 13.8 % (ref 11.5–15.5)
WBC Count: 5.6 10*3/uL (ref 4.0–10.5)
nRBC: 0 % (ref 0.0–0.2)

## 2018-12-06 LAB — CMP (CANCER CENTER ONLY)
ALT: 34 U/L (ref 0–44)
AST: 29 U/L (ref 15–41)
Albumin: 4.1 g/dL (ref 3.5–5.0)
Alkaline Phosphatase: 52 U/L (ref 38–126)
Anion gap: 11 (ref 5–15)
BUN: 12 mg/dL (ref 8–23)
CO2: 26 mmol/L (ref 22–32)
Calcium: 8.9 mg/dL (ref 8.9–10.3)
Chloride: 103 mmol/L (ref 98–111)
Creatinine: 0.95 mg/dL (ref 0.61–1.24)
GFR, Est AFR Am: >60 mL/min (ref >60)
GFR, Estimated: >60 mL/min (ref >60)
Glucose, Bld: 91 mg/dL (ref 70–99)
Potassium: 3.9 mmol/L (ref 3.5–5.1)
Sodium: 140 mmol/L (ref 135–145)
Total Bilirubin: 0.5 mg/dL (ref 0.3–1.2)
Total Protein: 7.1 g/dL (ref 6.5–8.1)

## 2018-12-06 MED ORDER — SODIUM CHLORIDE 0.9% FLUSH
10.0000 mL | INTRAVENOUS | Status: DC | PRN
  Administered 2018-12-06: 10 mL
  Filled 2018-12-06: qty 10

## 2018-12-06 MED ORDER — PROCHLORPERAZINE MALEATE 10 MG PO TABS
ORAL_TABLET | ORAL | Status: AC
  Filled 2018-12-06: qty 1

## 2018-12-06 MED ORDER — PACLITAXEL PROTEIN-BOUND CHEMO INJECTION 100 MG
125.0000 mg/m2 | Freq: Once | INTRAVENOUS | Status: AC
  Administered 2018-12-06: 250 mg via INTRAVENOUS
  Filled 2018-12-06: qty 50

## 2018-12-06 MED ORDER — HEPARIN SOD (PORK) LOCK FLUSH 100 UNIT/ML IV SOLN
500.0000 [IU] | Freq: Once | INTRAVENOUS | Status: AC | PRN
  Administered 2018-12-06: 500 [IU]
  Filled 2018-12-06: qty 5

## 2018-12-06 MED ORDER — PROCHLORPERAZINE MALEATE 10 MG PO TABS
10.0000 mg | ORAL_TABLET | Freq: Once | ORAL | Status: AC
  Administered 2018-12-06: 10 mg via ORAL

## 2018-12-06 MED ORDER — SODIUM CHLORIDE 0.9 % IV SOLN
Freq: Once | INTRAVENOUS | Status: DC
  Filled 2018-12-06: qty 250

## 2018-12-06 MED ORDER — SODIUM CHLORIDE 0.9 % IV SOLN
2000.0000 mg | Freq: Once | INTRAVENOUS | Status: DC
  Filled 2018-12-06: qty 53

## 2018-12-06 MED ORDER — SODIUM CHLORIDE 0.9 % IV SOLN
Freq: Once | INTRAVENOUS | Status: AC
  Administered 2018-12-06: 14:00:00 via INTRAVENOUS
  Filled 2018-12-06: qty 250

## 2018-12-06 MED ORDER — SODIUM CHLORIDE 0.9 % IV SOLN
2000.0000 mg | Freq: Once | INTRAVENOUS | Status: AC
  Administered 2018-12-06: 2000 mg via INTRAVENOUS
  Filled 2018-12-06: qty 52.6

## 2018-12-06 NOTE — Progress Notes (Signed)
[facility] OFFICE PROGRESS NOTE   Diagnosis: Pancreas cancer  INTERVAL HISTORY:   Alex [name] returns as scheduled.  He completed cycle 4 gemcitabine/Abraxane 11/22/2018.  He overall feels well.  He denies nausea/vomiting.  No mouth sores.  No diarrhea.  No constipation.  No numbness or tingling in the hands or feet.  No fever or rash following treatment.  He denies shortness of breath and cough.  Only complaint today is an alteration in taste.  Objective:  Vital signs in last 24 hours:  Blood pressure 132/72, pulse 62, temperature 98.7 F (37.1 C), temperature source Oral, resp. rate 17, height '5\' 6"'  (1.676 m), weight 205 lb 9.6 oz (93.3 kg), SpO2 98 %.    HEENT: No thrush or ulcers. GI: Abdomen soft and nontender.  No hepatomegaly.  Left lower quadrant colostomy. Vascular: No leg edema. Neuro: Vibratory sense mildly decreased over the fingertips per tuning fork exam. Skin: No rash. Port-A-Cath without erythema.   Lab Results:  Lab Results  Component Value Date   WBC 5.6 12/06/2018   HGB 13.1 12/06/2018   HCT 41.0 12/06/2018   MCV 89.5 12/06/2018   PLT 172 12/06/2018   NEUTROABS 2.9 12/06/2018    Imaging:  No results found.  Medications: I have reviewed the patient's current medications.  Assessment/Plan: 1. 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 Well-differentiated adenocarcinoma, 0/13 lymph nodes positive, MSI-stable, no loss of mismatch repair protein expression ? Tumor involving the rectosigmoid junction ? Sigmoidoscopy 09/18/2017-distal sigmoid colon tumor beginning between 15 and 20 cm from the dentate line, completely obstructing ? CT abdomen/pelvis 09/14/2017-sigmoid thickening no evidence of metastatic disease ? 2 mm right upper lobe nodule on CT 08/04/2017 ? Colonoscopy 02/07/2018-polyps removed from the cecum, ascending colon, transverse colon, descending  colon, rectosigmoid colon and rectum (tubular adenomas)  2. Right lung pneumonia/empyema March 2019  Right VATS, drainage of empyema, and decortication of the right lower lobe 08/31/2017  3.CVA in 2010  4.Rectal and active sigmoid polyps noted on flexible sigmoidoscopy 09/18/2017  5.Pancreas tail mass/right liver lesions and 4 mm splenic lesion on CT abdomen/pelvis 05/27/2018  MRI abdomen 07/27/2018-right liver lesions, rim-enhancing lesion in the pancreas tail  Ultrasound-guided biopsy of a right liver lesion 08/14/2018-metastatic adenocarcinoma, cytokeratin 7+, CDX-2 weak positive, cytokeratin 20 negative, consistent with metastatic pancreas cancer; MSI stable; tumor mutational burden 0  CT abdomen/pelvis 09/26/2018-increased size of previously noted liver lesions, new right liver lesion, increased size of pancreas mass with occlusion of the splenic vein  Cycle 1 gemcitabine/Abraxane 10/11/2018  Cycle 2 gemcitabine/Abraxane 10/25/2018  Cycle 3 gemcitabine/Abraxane 11/08/2018  Cycle 4 gemcitabine/Abraxane 11/22/2018  Cycle 5 gemcitabine/Abraxane 12/06/2018  6.Port-A-Cath placement by Dr. Connor4/24/2020    Disposition: [name] appears well.  He has completed 4 cycles of gemcitabine/Abraxane.  He is tolerating treatment well.  There is no clinical evidence of disease progression.  Most recent CA-19-9 was further improved, now in normal range.  We will follow-up on the CA-19-9 from today.  Plan to proceed with cycle 5 gemcitabine/Abraxane today as scheduled.  He will undergo restaging CTs prior to his next visit in 2 weeks.  We reviewed the CBC from today.  Counts are adequate for treatment.  We made a referral to the [place] dietitian regarding the alteration in taste.  His weight is stable.  He will return for lab, follow-up, gemcitabine/Abraxane in 2 weeks.  He will contact the office in the interim with any  problems.    Alex [name] ANP/GNP-BC    12/06/2018  12:37 PM

## 2018-12-06 NOTE — Progress Notes (Signed)
Per Lattie Haw gave patient contrast

## 2018-12-06 NOTE — Patient Instructions (Signed)
[facility] Discharge Instructions for Patients Receiving Chemotherapy  Today you received the following chemotherapy agents Abraxane, Gemzar  To help prevent nausea and vomiting after your treatment, we encourage you to take your nausea medication as directed by your MD.   If you develop nausea and vomiting that is not controlled by your nausea medication, call the clinic.   BELOW ARE SYMPTOMS THAT SHOULD BE REPORTED IMMEDIATELY:  *FEVER GREATER THAN 100.5 F  *CHILLS WITH OR WITHOUT FEVER  NAUSEA AND VOMITING THAT IS NOT CONTROLLED WITH YOUR NAUSEA MEDICATION  *UNUSUAL SHORTNESS OF BREATH  *UNUSUAL BRUISING OR BLEEDING  TENDERNESS IN MOUTH AND THROAT WITH OR WITHOUT PRESENCE OF ULCERS  *URINARY PROBLEMS  *BOWEL PROBLEMS  UNUSUAL RASH Items with * indicate a potential emergency and should be followed up as soon as possible.  Feel free to call the clinic should you have any questions or concerns. The clinic phone number is [phone].  Please show the [facility] at check-in to the Emergency Department and triage nurse.  Coronavirus (COVID-19) Are you at risk?  Are you at risk for the Coronavirus (COVID-19)?  To be considered HIGH RISK for Coronavirus (COVID-19), you have to meet the following criteria:  . Traveled to [place], [place], [place], [place] or [place]; or in the [place] to [place], [place], [place], or [place]; and have fever, cough, and shortness of breath within the last 2 weeks of travel OR . Been in close contact with a person diagnosed with COVID-19 within the last 2 weeks and have fever, cough, and shortness of breath . IF YOU DO NOT MEET THESE CRITERIA, YOU ARE CONSIDERED LOW RISK FOR COVID-19.  What to do if you are HIGH RISK for COVID-19?  Marland Kitchen If you are having a medical emergency, call 911. . Seek medical care right away. Before you go to a doctor's office, urgent care or emergency department, call ahead and tell  them about your recent travel, contact with someone diagnosed with COVID-19, and your symptoms. You should receive instructions from your physician's office regarding next steps of care.  . When you arrive at healthcare provider, tell the healthcare staff immediately you have returned from visiting [place], [place], [place], [place] or [place]; or traveled in the [place] to [place], [place], [place], or [place]; in the last two weeks or you have been in close contact with a person diagnosed with COVID-19 in the last 2 weeks.   . Tell the health care staff about your symptoms: fever, cough and shortness of breath. . After you have been seen by a medical provider, you will be either: o Tested for (COVID-19) and discharged home on quarantine except to seek medical care if symptoms worsen, and asked to  - Stay home and avoid contact with others until you get your results (4-5 days)  - Avoid travel on public transportation if possible (such as bus, train, or airplane) or o Sent to the Emergency Department by EMS for evaluation, COVID-19 testing, and possible admission depending on your condition and test results.  What to do if you are LOW RISK for COVID-19?  Reduce your risk of any infection by using the same precautions used for avoiding the common cold or flu:  Marland Kitchen Wash your hands often with soap and warm water for at least 20 seconds.  If soap and water are not readily available, use an alcohol-based hand sanitizer with at least 60% alcohol.  Marland Kitchen  If coughing or sneezing, cover your mouth and nose by coughing or sneezing into the elbow areas of your shirt or coat, into a tissue or into your sleeve (not your hands). . Avoid shaking hands with others and consider head nods or verbal greetings only. . Avoid touching your eyes, nose, or mouth with unwashed hands.  . Avoid close contact with people who are sick. . Avoid places or events with large numbers of people in one location, like concerts or  sporting events. . Carefully consider travel plans you have or are making. . If you are planning any travel outside or inside the [place], visit the CDC's Travelers' Health webpage for the latest health notices. . If you have some symptoms but not all symptoms, continue to monitor at home and seek medical attention if your symptoms worsen. . If you are having a medical emergency, call 911.   [place] / e-Visit: [email or web address]         [facility]: [facility]: [phone]                   [facility]: [phone]

## 2018-12-07 LAB — CANCER ANTIGEN 19-9: CA 19-9: 18 U/mL (ref 0–35)

## 2018-12-14 ENCOUNTER — Other Ambulatory Visit: Payer: Self-pay | Admitting: Oncology

## 2018-12-17 ENCOUNTER — Inpatient Hospital Stay: Payer: Self-pay | Admitting: Nutrition

## 2018-12-17 NOTE — Progress Notes (Signed)
RD working remotely.  Contacted patient by phone to discuss taste alterations. He was not available but left a message with my contact information. I emailed him a fact sheet on taste alterations and encouraged him to call me.

## 2018-12-18 ENCOUNTER — Other Ambulatory Visit: Payer: Self-pay

## 2018-12-18 ENCOUNTER — Ambulatory Visit (HOSPITAL_COMMUNITY)
Admission: RE | Admit: 2018-12-18 | Discharge: 2018-12-18 | Disposition: A | Discharge status: Home / Self Care | Payer: Self-pay | Source: Ambulatory Visit | Attending: Nurse Practitioner | Admitting: Nurse Practitioner

## 2018-12-18 DIAGNOSIS — C252 Malignant neoplasm of tail of pancreas: Secondary | ICD-10-CM | POA: Insufficient documentation

## 2018-12-18 MED ORDER — HEPARIN SOD (PORK) LOCK FLUSH 100 UNIT/ML IV SOLN
500.0000 [IU] | Freq: Once | INTRAVENOUS | Status: AC
  Administered 2018-12-18: 500 [IU] via INTRAVENOUS

## 2018-12-18 MED ORDER — HEPARIN SOD (PORK) LOCK FLUSH 100 UNIT/ML IV SOLN
INTRAVENOUS | Status: AC
  Filled 2018-12-18: qty 5

## 2018-12-18 MED ORDER — IOHEXOL 300 MG/ML  SOLN
100.0000 mL | Freq: Once | INTRAMUSCULAR | Status: AC | PRN
  Administered 2018-12-18: 100 mL via INTRAVENOUS

## 2018-12-18 MED ORDER — SODIUM CHLORIDE (PF) 0.9 % IJ SOLN
INTRAMUSCULAR | Status: AC
  Filled 2018-12-18: qty 50

## 2018-12-20 ENCOUNTER — Inpatient Hospital Stay (HOSPITAL_BASED_OUTPATIENT_CLINIC_OR_DEPARTMENT_OTHER): Payer: Self-pay | Admitting: Nurse Practitioner

## 2018-12-20 ENCOUNTER — Inpatient Hospital Stay: Payer: Self-pay

## 2018-12-20 ENCOUNTER — Other Ambulatory Visit: Payer: Self-pay

## 2018-12-20 ENCOUNTER — Encounter: Payer: Self-pay | Admitting: Nurse Practitioner

## 2018-12-20 VITALS — BP 120/78 | HR 62 | Temp 98.9°F | Resp 18 | Ht 66.0 in | Wt 206.3 lb

## 2018-12-20 DIAGNOSIS — C252 Malignant neoplasm of tail of pancreas: Secondary | ICD-10-CM

## 2018-12-20 DIAGNOSIS — Z95828 Presence of other vascular implants and grafts: Secondary | ICD-10-CM

## 2018-12-20 DIAGNOSIS — C19 Malignant neoplasm of rectosigmoid junction: Secondary | ICD-10-CM

## 2018-12-20 DIAGNOSIS — C787 Secondary malignant neoplasm of liver and intrahepatic bile duct: Secondary | ICD-10-CM

## 2018-12-20 LAB — CMP (CANCER CENTER ONLY)
ALT: 48 U/L — ABNORMAL HIGH (ref 0–44)
AST: 41 U/L (ref 15–41)
Albumin: 3.8 g/dL (ref 3.5–5.0)
Alkaline Phosphatase: 47 U/L (ref 38–126)
Anion gap: 7 (ref 5–15)
BUN: 16 mg/dL (ref 8–23)
CO2: 28 mmol/L (ref 22–32)
Calcium: 8.7 mg/dL — ABNORMAL LOW (ref 8.9–10.3)
Chloride: 104 mmol/L (ref 98–111)
Creatinine: 0.92 mg/dL (ref 0.61–1.24)
GFR, Est AFR Am: >60 mL/min (ref >60)
GFR, Estimated: >60 mL/min (ref >60)
Glucose, Bld: 116 mg/dL — ABNORMAL HIGH (ref 70–99)
Potassium: 3.5 mmol/L (ref 3.5–5.1)
Sodium: 139 mmol/L (ref 135–145)
Total Bilirubin: 0.4 mg/dL (ref 0.3–1.2)
Total Protein: 6.9 g/dL (ref 6.5–8.1)

## 2018-12-20 LAB — CBC WITH DIFFERENTIAL (CANCER CENTER ONLY)
Abs Immature Granulocytes: 0.03 10*3/uL (ref 0.00–0.07)
Basophils Absolute: 0 10*3/uL (ref 0.0–0.1)
Basophils Relative: 1 %
Eosinophils Absolute: 0.2 10*3/uL (ref 0.0–0.5)
Eosinophils Relative: 4 %
HCT: 40.6 % (ref 39.0–52.0)
Hemoglobin: 13 g/dL (ref 13.0–17.0)
Immature Granulocytes: 1 %
Lymphocytes Relative: 21 %
Lymphs Abs: 1.2 10*3/uL (ref 0.7–4.0)
MCH: 28.9 pg (ref 26.0–34.0)
MCHC: 32 g/dL (ref 30.0–36.0)
MCV: 90.2 fL (ref 80.0–100.0)
Monocytes Absolute: 0.9 10*3/uL (ref 0.1–1.0)
Monocytes Relative: 15 %
Neutro Abs: 3.4 10*3/uL (ref 1.7–7.7)
Neutrophils Relative %: 58 %
Platelet Count: 153 10*3/uL (ref 150–400)
RBC: 4.5 MIL/uL (ref 4.22–5.81)
RDW: 13.9 % (ref 11.5–15.5)
WBC Count: 5.8 10*3/uL (ref 4.0–10.5)
nRBC: 0 % (ref 0.0–0.2)

## 2018-12-20 MED ORDER — SODIUM CHLORIDE 0.9% FLUSH
10.0000 mL | INTRAVENOUS | Status: DC | PRN
  Administered 2018-12-20: 10 mL
  Filled 2018-12-20: qty 10

## 2018-12-20 MED ORDER — SODIUM CHLORIDE 0.9 % IV SOLN
Freq: Once | INTRAVENOUS | Status: AC
  Administered 2018-12-20: 11:00:00 via INTRAVENOUS
  Filled 2018-12-20: qty 250

## 2018-12-20 MED ORDER — PROCHLORPERAZINE MALEATE 10 MG PO TABS
10.0000 mg | ORAL_TABLET | Freq: Once | ORAL | Status: AC
  Administered 2018-12-20: 10 mg via ORAL

## 2018-12-20 MED ORDER — PACLITAXEL PROTEIN-BOUND CHEMO INJECTION 100 MG
125.0000 mg/m2 | Freq: Once | INTRAVENOUS | Status: AC
  Administered 2018-12-20: 250 mg via INTRAVENOUS
  Filled 2018-12-20: qty 50

## 2018-12-20 MED ORDER — PROCHLORPERAZINE MALEATE 10 MG PO TABS
ORAL_TABLET | ORAL | Status: AC
  Filled 2018-12-20: qty 1

## 2018-12-20 MED ORDER — HEPARIN SOD (PORK) LOCK FLUSH 100 UNIT/ML IV SOLN
500.0000 [IU] | Freq: Once | INTRAVENOUS | Status: AC | PRN
  Administered 2018-12-20: 500 [IU]
  Filled 2018-12-20: qty 5

## 2018-12-20 MED ORDER — SODIUM CHLORIDE 0.9 % IV SOLN
2000.0000 mg | Freq: Once | INTRAVENOUS | Status: AC
  Administered 2018-12-20: 2000 mg via INTRAVENOUS
  Filled 2018-12-20: qty 52.6

## 2018-12-20 NOTE — Patient Instructions (Signed)

## 2018-12-20 NOTE — Patient Instructions (Signed)
Coronavirus (COVID-19) Are you at risk?  Are you at risk for the Coronavirus (COVID-19)?  To be considered HIGH RISK for Coronavirus (COVID-19), you have to meet the following criteria:  . Traveled to [place], [place], [place], [place] or [place]; or in the [place] to [place], [place], [place], or [place]; and have fever, cough, and shortness of breath within the last 2 weeks of travel OR . Been in close contact with a person diagnosed with COVID-19 within the last 2 weeks and have fever, cough, and shortness of breath . IF YOU DO NOT MEET THESE CRITERIA, YOU ARE CONSIDERED LOW RISK FOR COVID-19.  What to do if you are HIGH RISK for COVID-19?  Marland Kitchen If you are having a medical emergency, call 911. . Seek medical care right away. Before you go to a doctor's office, urgent care or emergency department, call ahead and tell them about your recent travel, contact with someone diagnosed with COVID-19, and your symptoms. You should receive instructions from your physician's office regarding next steps of care.  . When you arrive at healthcare provider, tell the healthcare staff immediately you have returned from visiting [place], [place], [place], [place] or [place]; or traveled in the [place] to [place], [place], [place], or [place]; in the last two weeks or you have been in close contact with a person diagnosed with COVID-19 in the last 2 weeks.   . Tell the health care staff about your symptoms: fever, cough and shortness of breath. . After you have been seen by a medical provider, you will be either: o Tested for (COVID-19) and discharged home on quarantine except to seek medical care if symptoms worsen, and asked to  - Stay home and avoid contact with others until you get your results (4-5 days)  - Avoid travel on public transportation if possible (such as bus, train, or airplane) or o Sent to the Emergency Department by EMS for evaluation, COVID-19 testing, and possible  admission depending on your condition and test results.  What to do if you are LOW RISK for COVID-19?  Reduce your risk of any infection by using the same precautions used for avoiding the common cold or flu:  Marland Kitchen Wash your hands often with soap and warm water for at least 20 seconds.  If soap and water are not readily available, use an alcohol-based hand sanitizer with at least 60% alcohol.  . If coughing or sneezing, cover your mouth and nose by coughing or sneezing into the elbow areas of your shirt or coat, into a tissue or into your sleeve (not your hands). . Avoid shaking hands with others and consider head nods or verbal greetings only. . Avoid touching your eyes, nose, or mouth with unwashed hands.  . Avoid close contact with people who are sick. . Avoid places or events with large numbers of people in one location, like concerts or sporting events. . Carefully consider travel plans you have or are making. . If you are planning any travel outside or inside the [place], visit the CDC's Travelers' Health webpage for the latest health notices. . If you have some symptoms but not all symptoms, continue to monitor at home and seek medical attention if your symptoms worsen. . If you are having a medical emergency, call 911.   [place] / e-Visit: [email or web address]         [facility]: [facility]: [facility]: [place] Discharge Instructions for Patients Receiving Chemotherapy  Today you received the following chemotherapy agents Abraxane and Gemzar  To help prevent nausea and vomiting after your treatment, we encourage you to take your nausea medication as directed.    If you develop nausea and vomiting that is not controlled by your nausea medication, call the clinic.    BELOW ARE SYMPTOMS THAT SHOULD BE REPORTED IMMEDIATELY:  *FEVER GREATER THAN 100.5 F  *CHILLS WITH OR WITHOUT FEVER  NAUSEA AND VOMITING THAT IS NOT CONTROLLED WITH YOUR NAUSEA MEDICATION  *UNUSUAL SHORTNESS OF BREATH  *UNUSUAL BRUISING OR BLEEDING  TENDERNESS IN MOUTH AND THROAT WITH OR WITHOUT PRESENCE OF ULCERS  *URINARY PROBLEMS  *BOWEL PROBLEMS  UNUSUAL RASH Items with * indicate a potential emergency and should be followed up as soon as possible.  Feel free to call the clinic should you have any questions or concerns. The clinic phone number is [phone].  Please show the [place] at check-in to the Emergency Department and triage nurse.

## 2018-12-20 NOTE — Progress Notes (Addendum)
[place] OFFICE PROGRESS NOTE   Diagnosis: Pancreas cancer  INTERVAL HISTORY:   Alex [name] returns as scheduled.  He completed cycle 5 gemcitabine/Abraxane 12/06/2018.  He feels well.  No nausea or vomiting.  No mouth sores.  No diarrhea.  No fever or rash following treatment.  No numbness or tingling in the hands.  Feet with mild intermittent tingling.  He denies pain.  He has a good appetite.  He continues to note an alteration in taste.  Objective:  Vital signs in last 24 hours:  Blood pressure 120/78, pulse 62, temperature 98.9 F (37.2 C), temperature source Oral, resp. rate 18, height _0  (1.676 m), weight 206 lb 4.8 oz (93.6 kg), SpO2 98 %.    HEENT: No thrush or ulcers. GI: Abdomen soft and nontender.  No hepatomegaly.  Left lower quadrant colostomy. Vascular: No leg edema. Skin: Palms without erythema. Port-A-Cath without erythema.   Lab Results:  Lab Results  Component Value Date   WBC 5.8 12/20/2018   HGB 13.0 12/20/2018   HCT 40.6 12/20/2018   MCV 90.2 12/20/2018   PLT 153 12/20/2018   NEUTROABS 3.4 12/20/2018    Imaging:  No results found.  Medications: I have reviewed the patient's current medications.  Assessment/Plan: 1. 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 Well-differentiated adenocarcinoma, 0/13 lymph nodes positive, MSI-stable, no loss of mismatch repair protein expression ? Tumor involving the rectosigmoid junction ? Sigmoidoscopy 09/18/2017-distal sigmoid colon tumor beginning between 15 and 20 cm from the dentate line, completely obstructing ? CT abdomen/pelvis 09/14/2017-sigmoid thickening no evidence of metastatic disease ? 2 mm right upper lobe nodule on CT 08/04/2017 ? Colonoscopy 02/07/2018-polyps removed from the cecum, ascending colon, transverse colon, descending colon, rectosigmoid colon and rectum (tubular adenomas)  2. Right lung  pneumonia/empyema March 2019  Right VATS, drainage of empyema, and decortication of the right lower lobe 08/31/2017  3.CVA in 2010  4.Rectal and active sigmoid polyps noted on flexible sigmoidoscopy 09/18/2017  5.Pancreas tail mass/right liver lesions and 4 mm splenic lesion on CT abdomen/pelvis 05/27/2018  MRI abdomen 07/27/2018-right liver lesions, rim-enhancing lesion in the pancreas tail  Ultrasound-guided biopsy of a right liver lesion 08/14/2018-metastatic adenocarcinoma, cytokeratin 7+, CDX-2 weak positive, cytokeratin 20 negative, consistent with metastatic pancreas cancer; MSI stable; tumor mutational burden 0  CT abdomen/pelvis 09/26/2018-increased size of previously noted liver lesions, new right liver lesion, 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  Cycle 6 gemcitabine/Abraxane 12/20/2018  6.Port-A-Cath placement by Dr. Connor4/24/2020  Disposition: Alex [name] appears well.  He has completed 5 cycles of gemcitabine/Abraxane.  Most recent CA-19-9 was further improved.  CT scan 12/18/2018 shows improvement in liver metastases and pancreas mass.  The plan is to continue gemcitabine/Abraxane every 2 weeks, cycle 6 today.  We reviewed labs from today, adequate for treatment.  He will return for lab, follow-up, gemcitabine/Abraxane in 2 weeks.  He will contact the office in the interim with any problems.  Patient seen with Dr. Benay Spice.  CT images reviewed on the computer with Alex [name].    Ned Card ANP/GNP-BC   12/20/2018  10:12 AM This was a shared visit with Lattie Haw  Deriyah Kunath.  Alex [name]  is tolerating the chemotherapy well.  The CA 19-9 is lower and the restaging CT confirms a response to therapy.  We reviewed the CT images with Alex [name].  The plan is to continue gemcitabine/Abraxane.  Julieanne Manson, MD

## 2018-12-21 LAB — CANCER ANTIGEN 19-9: CA 19-9: 13 U/mL (ref 0–35)

## 2018-12-24 ENCOUNTER — Telehealth: Payer: Self-pay | Admitting: Oncology

## 2018-12-24 NOTE — Telephone Encounter (Signed)
Called and spoke with patient. Confirmed appt  °

## 2018-12-29 ENCOUNTER — Other Ambulatory Visit: Payer: Self-pay | Admitting: Oncology

## 2019-01-03 ENCOUNTER — Inpatient Hospital Stay: Payer: Self-pay | Attending: Nurse Practitioner | Admitting: Nurse Practitioner

## 2019-01-03 ENCOUNTER — Encounter: Payer: Self-pay | Admitting: Nurse Practitioner

## 2019-01-03 ENCOUNTER — Other Ambulatory Visit: Payer: Self-pay

## 2019-01-03 ENCOUNTER — Inpatient Hospital Stay: Payer: Self-pay

## 2019-01-03 VITALS — BP 125/76 | HR 65 | Temp 98.0°F | Resp 18 | Ht 66.0 in | Wt 205.1 lb

## 2019-01-03 DIAGNOSIS — Z5111 Encounter for antineoplastic chemotherapy: Secondary | ICD-10-CM | POA: Insufficient documentation

## 2019-01-03 DIAGNOSIS — C19 Malignant neoplasm of rectosigmoid junction: Secondary | ICD-10-CM | POA: Insufficient documentation

## 2019-01-03 DIAGNOSIS — Z95828 Presence of other vascular implants and grafts: Secondary | ICD-10-CM

## 2019-01-03 DIAGNOSIS — C252 Malignant neoplasm of tail of pancreas: Secondary | ICD-10-CM | POA: Insufficient documentation

## 2019-01-03 DIAGNOSIS — Z8673 Personal history of transient ischemic attack (TIA), and cerebral infarction without residual deficits: Secondary | ICD-10-CM | POA: Insufficient documentation

## 2019-01-03 DIAGNOSIS — C787 Secondary malignant neoplasm of liver and intrahepatic bile duct: Secondary | ICD-10-CM | POA: Insufficient documentation

## 2019-01-03 LAB — CMP (CANCER CENTER ONLY)
ALT: 31 U/L (ref 0–44)
AST: 26 U/L (ref 15–41)
Albumin: 4 g/dL (ref 3.5–5.0)
Alkaline Phosphatase: 51 U/L (ref 38–126)
Anion gap: 11 (ref 5–15)
BUN: 15 mg/dL (ref 8–23)
CO2: 25 mmol/L (ref 22–32)
Calcium: 9.1 mg/dL (ref 8.9–10.3)
Chloride: 103 mmol/L (ref 98–111)
Creatinine: 0.84 mg/dL (ref 0.61–1.24)
GFR, Est AFR Am: >60 mL/min (ref >60)
GFR, Estimated: >60 mL/min (ref >60)
Glucose, Bld: 102 mg/dL — ABNORMAL HIGH (ref 70–99)
Potassium: 3.9 mmol/L (ref 3.5–5.1)
Sodium: 139 mmol/L (ref 135–145)
Total Bilirubin: 0.5 mg/dL (ref 0.3–1.2)
Total Protein: 7 g/dL (ref 6.5–8.1)

## 2019-01-03 LAB — CBC WITH DIFFERENTIAL (CANCER CENTER ONLY)
Abs Immature Granulocytes: 0.04 10*3/uL (ref 0.00–0.07)
Basophils Absolute: 0.1 10*3/uL (ref 0.0–0.1)
Basophils Relative: 1 %
Eosinophils Absolute: 0.2 10*3/uL (ref 0.0–0.5)
Eosinophils Relative: 4 %
HCT: 41.4 % (ref 39.0–52.0)
Hemoglobin: 13.2 g/dL (ref 13.0–17.0)
Immature Granulocytes: 1 %
Lymphocytes Relative: 21 %
Lymphs Abs: 1.1 10*3/uL (ref 0.7–4.0)
MCH: 28.8 pg (ref 26.0–34.0)
MCHC: 31.9 g/dL (ref 30.0–36.0)
MCV: 90.2 fL (ref 80.0–100.0)
Monocytes Absolute: 0.9 10*3/uL (ref 0.1–1.0)
Monocytes Relative: 18 %
Neutro Abs: 2.9 10*3/uL (ref 1.7–7.7)
Neutrophils Relative %: 55 %
Platelet Count: 171 10*3/uL (ref 150–400)
RBC: 4.59 MIL/uL (ref 4.22–5.81)
RDW: 13.8 % (ref 11.5–15.5)
WBC Count: 5.3 10*3/uL (ref 4.0–10.5)
nRBC: 0 % (ref 0.0–0.2)

## 2019-01-03 MED ORDER — PACLITAXEL PROTEIN-BOUND CHEMO INJECTION 100 MG
125.0000 mg/m2 | Freq: Once | INTRAVENOUS | Status: AC
  Administered 2019-01-03: 15:00:00 250 mg via INTRAVENOUS
  Filled 2019-01-03: qty 50

## 2019-01-03 MED ORDER — SODIUM CHLORIDE 0.9% FLUSH
10.0000 mL | INTRAVENOUS | Status: DC | PRN
  Administered 2019-01-03: 10 mL
  Filled 2019-01-03: qty 10

## 2019-01-03 MED ORDER — PROCHLORPERAZINE MALEATE 10 MG PO TABS
ORAL_TABLET | ORAL | Status: AC
  Filled 2019-01-03: qty 1

## 2019-01-03 MED ORDER — HEPARIN SOD (PORK) LOCK FLUSH 100 UNIT/ML IV SOLN
500.0000 [IU] | Freq: Once | INTRAVENOUS | Status: AC | PRN
  Administered 2019-01-03: 500 [IU]
  Filled 2019-01-03: qty 5

## 2019-01-03 MED ORDER — SODIUM CHLORIDE 0.9 % IV SOLN
2000.0000 mg | Freq: Once | INTRAVENOUS | Status: AC
  Administered 2019-01-03: 2000 mg via INTRAVENOUS
  Filled 2019-01-03: qty 52.6

## 2019-01-03 MED ORDER — PROCHLORPERAZINE MALEATE 10 MG PO TABS
10.0000 mg | ORAL_TABLET | Freq: Once | ORAL | Status: AC
  Administered 2019-01-03: 10 mg via ORAL

## 2019-01-03 MED ORDER — SODIUM CHLORIDE 0.9 % IV SOLN
Freq: Once | INTRAVENOUS | Status: AC
  Administered 2019-01-03: 14:00:00 via INTRAVENOUS
  Filled 2019-01-03: qty 250

## 2019-01-03 NOTE — Patient Instructions (Signed)

## 2019-01-03 NOTE — Progress Notes (Signed)
  [place] OFFICE PROGRESS NOTE   Diagnosis: Pancreas cancer  INTERVAL HISTORY:   [name] returns as scheduled.  He completed cycle 6 gemcitabine/Abraxane 12/20/2018.  He denies nausea/vomiting.  No mouth sores.  No diarrhea.  No fever or rash following treatment.  No numbness in the hands or feet.  He has mild tingling in his toes when he gets up in the mornings.  The tingling resolves with activity.  Objective:  Vital signs in last 24 hours:  Blood pressure 125/76, pulse 65, temperature 98 F (36.7 C), temperature source Oral, resp. rate 18, height '5\' 6"'$  (1.676 m), weight 205 lb 1.6 oz (93 kg), SpO2 97 %.    HEENT: No thrush or ulcers. GI: Abdomen soft and nontender.  No hepatomegaly.  Left lower quadrant colostomy. Vascular: No leg edema. Neuro: Vibratory sense mildly to moderately decreased over the fingertips per tuning fork exam. Skin: Palms without erythema. Port-A-Cath without erythema.   Lab Results:  Lab Results  Component Value Date   WBC 5.8 12/20/2018   HGB 13.0 12/20/2018   HCT 40.6 12/20/2018   MCV 90.2 12/20/2018   PLT 153 12/20/2018   NEUTROABS 3.4 12/20/2018    Imaging:  No results found.  Medications: I have reviewed the patient's current medications.  Assessment/Plan: 1. 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 Well-differentiated adenocarcinoma, 0/13 lymph nodes positive, MSI-stable, no loss of mismatch repair protein expression ? Tumor involving the rectosigmoid junction ? Sigmoidoscopy 09/18/2017-distal sigmoid colon tumor beginning between 15 and 20 cm from the dentate line, completely obstructing ? CT abdomen/pelvis 09/14/2017-sigmoid thickening no evidence of metastatic disease ? 2 mm right upper lobe nodule on CT 08/04/2017 ? Colonoscopy 02/07/2018-polyps removed from the cecum, ascending colon, transverse colon, descending colon, rectosigmoid  colon and rectum (tubular adenomas)  2. Right lung pneumonia/empyema March 2019  Right VATS, drainage of empyema, and decortication of the right lower lobe 08/31/2017  3.CVA in 2010  4.Rectal and active sigmoid polyps noted on flexible sigmoidoscopy 09/18/2017  5.Pancreas tail mass/right liver lesions and 4 mm splenic lesion on CT abdomen/pelvis 05/27/2018  MRI abdomen 07/27/2018-right liver lesions, rim-enhancing lesion in the pancreas tail  Ultrasound-guided biopsy of a right liver lesion 08/14/2018-metastatic adenocarcinoma, cytokeratin 7+, CDX-2 weak positive, cytokeratin 20 negative, consistent with metastatic pancreas cancer; MSI stable; tumor mutational burden 0  CT abdomen/pelvis 09/26/2018-increased size of previously noted liver lesions, new right liver lesion, 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  Cycle 6 gemcitabine/Abraxane 12/20/2018  6.Port-A-Cath placement by Dr. Connor4/24/2020   Disposition: Mr. Hillmer appears stable.  He has completed 6 cycles of gemcitabine/Abraxane.  There is no clinical evidence of disease progression.  Plan to proceed with cycle 7 today as scheduled.  We reviewed the CBC from today.  Counts adequate for treatment.  He will return for lab, follow-up, gemcitabine/Abraxane in 2 weeks.  He will contact the office in the interim with any problems.    Ned Card ANP/GNP-BC   01/03/2019  12:27 PM

## 2019-01-03 NOTE — Patient Instructions (Signed)
  Today you received the following chemotherapy agents Abraxane and Gemzar  To help prevent nausea and vomiting after your treatment, we encourage you to take your nausea medication as directed.    If you develop nausea and vomiting that is not controlled by your nausea medication, call the clinic.   BELOW ARE SYMPTOMS THAT SHOULD BE REPORTED IMMEDIATELY:  *FEVER GREATER THAN 100.5 F  *CHILLS WITH OR WITHOUT FEVER  NAUSEA AND VOMITING THAT IS NOT CONTROLLED WITH YOUR NAUSEA MEDICATION  *UNUSUAL SHORTNESS OF BREATH  *UNUSUAL BRUISING OR BLEEDING  TENDERNESS IN MOUTH AND THROAT WITH OR WITHOUT PRESENCE OF ULCERS  *URINARY PROBLEMS  *BOWEL PROBLEMS  UNUSUAL RASH Items with * indicate a potential emergency and should be followed up as soon as possible.  Feel free to call the clinic should you have any questions or concerns. The clinic phone number is [phone].  Please show the [facility] at check-in to the Emergency Department and triage nurse.

## 2019-01-04 LAB — CANCER ANTIGEN 19-9: CA 19-9: 11 U/mL (ref 0–35)

## 2019-01-12 ENCOUNTER — Other Ambulatory Visit: Payer: Self-pay | Admitting: Oncology

## 2019-01-17 ENCOUNTER — Inpatient Hospital Stay: Payer: Self-pay

## 2019-01-17 ENCOUNTER — Other Ambulatory Visit: Payer: Self-pay

## 2019-01-17 ENCOUNTER — Inpatient Hospital Stay (HOSPITAL_BASED_OUTPATIENT_CLINIC_OR_DEPARTMENT_OTHER): Payer: Self-pay | Admitting: Oncology

## 2019-01-17 VITALS — BP 121/76 | HR 67 | Temp 98.7°F | Resp 18 | Ht 66.0 in | Wt 205.8 lb

## 2019-01-17 DIAGNOSIS — C252 Malignant neoplasm of tail of pancreas: Secondary | ICD-10-CM

## 2019-01-17 DIAGNOSIS — Z95828 Presence of other vascular implants and grafts: Secondary | ICD-10-CM

## 2019-01-17 LAB — CBC WITH DIFFERENTIAL (CANCER CENTER ONLY)
Abs Immature Granulocytes: 0.05 10*3/uL (ref 0.00–0.07)
Basophils Absolute: 0.1 10*3/uL (ref 0.0–0.1)
Basophils Relative: 1 %
Eosinophils Absolute: 0.4 10*3/uL (ref 0.0–0.5)
Eosinophils Relative: 6 %
HCT: 40.7 % (ref 39.0–52.0)
Hemoglobin: 13.2 g/dL (ref 13.0–17.0)
Immature Granulocytes: 1 %
Lymphocytes Relative: 17 %
Lymphs Abs: 1.3 10*3/uL (ref 0.7–4.0)
MCH: 28.9 pg (ref 26.0–34.0)
MCHC: 32.4 g/dL (ref 30.0–36.0)
MCV: 89.3 fL (ref 80.0–100.0)
Monocytes Absolute: 1.1 10*3/uL — ABNORMAL HIGH (ref 0.1–1.0)
Monocytes Relative: 15 %
Neutro Abs: 4.4 10*3/uL (ref 1.7–7.7)
Neutrophils Relative %: 60 %
Platelet Count: 161 10*3/uL (ref 150–400)
RBC: 4.56 MIL/uL (ref 4.22–5.81)
RDW: 13.8 % (ref 11.5–15.5)
WBC Count: 7.2 10*3/uL (ref 4.0–10.5)
nRBC: 0 % (ref 0.0–0.2)

## 2019-01-17 LAB — CMP (CANCER CENTER ONLY)
ALT: 30 U/L (ref 0–44)
AST: 30 U/L (ref 15–41)
Albumin: 3.8 g/dL (ref 3.5–5.0)
Alkaline Phosphatase: 50 U/L (ref 38–126)
Anion gap: 10 (ref 5–15)
BUN: 20 mg/dL (ref 8–23)
CO2: 27 mmol/L (ref 22–32)
Calcium: 8.9 mg/dL (ref 8.9–10.3)
Chloride: 104 mmol/L (ref 98–111)
Creatinine: 0.89 mg/dL (ref 0.61–1.24)
GFR, Est AFR Am: >60 mL/min (ref >60)
GFR, Estimated: >60 mL/min (ref >60)
Glucose, Bld: 125 mg/dL — ABNORMAL HIGH (ref 70–99)
Potassium: 3.6 mmol/L (ref 3.5–5.1)
Sodium: 141 mmol/L (ref 135–145)
Total Bilirubin: 0.4 mg/dL (ref 0.3–1.2)
Total Protein: 6.9 g/dL (ref 6.5–8.1)

## 2019-01-17 MED ORDER — PACLITAXEL PROTEIN-BOUND CHEMO INJECTION 100 MG
125.0000 mg/m2 | Freq: Once | INTRAVENOUS | Status: AC
  Administered 2019-01-17: 250 mg via INTRAVENOUS
  Filled 2019-01-17: qty 50

## 2019-01-17 MED ORDER — HEPARIN SOD (PORK) LOCK FLUSH 100 UNIT/ML IV SOLN
500.0000 [IU] | Freq: Once | INTRAVENOUS | Status: AC | PRN
  Administered 2019-01-17: 500 [IU]
  Filled 2019-01-17: qty 5

## 2019-01-17 MED ORDER — SODIUM CHLORIDE 0.9% FLUSH
10.0000 mL | INTRAVENOUS | Status: DC | PRN
  Administered 2019-01-17: 10 mL
  Filled 2019-01-17: qty 10

## 2019-01-17 MED ORDER — PROCHLORPERAZINE MALEATE 10 MG PO TABS
ORAL_TABLET | ORAL | Status: AC
  Filled 2019-01-17: qty 1

## 2019-01-17 MED ORDER — SODIUM CHLORIDE 0.9 % IV SOLN
Freq: Once | INTRAVENOUS | Status: AC
  Administered 2019-01-17: 13:00:00 via INTRAVENOUS
  Filled 2019-01-17: qty 250

## 2019-01-17 MED ORDER — PROCHLORPERAZINE MALEATE 10 MG PO TABS
10.0000 mg | ORAL_TABLET | Freq: Once | ORAL | Status: AC
  Administered 2019-01-17: 10 mg via ORAL

## 2019-01-17 MED ORDER — SODIUM CHLORIDE 0.9 % IV SOLN
2000.0000 mg | Freq: Once | INTRAVENOUS | Status: AC
  Administered 2019-01-17: 2000 mg via INTRAVENOUS
  Filled 2019-01-17: qty 52.6

## 2019-01-17 NOTE — Patient Instructions (Signed)

## 2019-01-17 NOTE — Progress Notes (Deleted)
    [place] OFFICE PROGRESS NOTE   Diagnosis:   INTERVAL HISTORY:   ***  Objective:  Vital signs in last 24 hours:  Blood pressure 121/76, pulse 67, temperature 98.7 F (37.1 C), temperature source Oral, resp. rate 18, height 5\' 6"  (1.676 m), weight 205 lb 12.8 oz (93.4 kg), SpO2 98 %.    HEENT: *** Lymphatics: *** Resp: *** Cardio: *** GI: *** Vascular: *** Neuro:***  Skin:***   Portacath/PICC-without erythema  Lab Results:  Lab Results  Component Value Date   WBC 7.2 01/17/2019   HGB 13.2 01/17/2019   HCT 40.7 01/17/2019   MCV 89.3 01/17/2019   PLT 161 01/17/2019   NEUTROABS 4.4 01/17/2019    CMP  Lab Results  Component Value Date   NA 141 01/17/2019   K 3.6 01/17/2019   CL 104 01/17/2019   CO2 27 01/17/2019   GLUCOSE 125 (H) 01/17/2019   BUN 20 01/17/2019   CREATININE 0.89 01/17/2019   CALCIUM 8.9 01/17/2019   PROT 6.9 01/17/2019   ALBUMIN 3.8 01/17/2019   AST 30 01/17/2019   ALT 30 01/17/2019   ALKPHOS 50 01/17/2019   BILITOT 0.4 01/17/2019   GFRNONAA >60 01/17/2019   GFRAA >60 01/17/2019    Lab Results  Component Value Date   CEA1 1.19 08/19/2018    Lab Results  Component Value Date   INR 1.0 08/14/2018    Imaging:  No results found.  Medications: I have reviewed the patient's current medications.   Assessment/Plan:    Disposition: ***  Betsy Coder, MD  01/17/2019  11:55 AM

## 2019-01-17 NOTE — Progress Notes (Signed)
[place] OFFICE PROGRESS NOTE   Diagnosis: Pancreas cancer  INTERVAL HISTORY:   Alex [name] returns as scheduled.  He completed another cycle of gemcitabine/Abraxane on 01/03/2019.  He feels well.  No pain.  No fever or rash.  He has numbness in the toes in the mornings.  This resolves later in the day.  He also has numbness of the right thumb.  Objective:  Vital signs in last 24 hours:  Blood pressure 121/76, pulse 67, temperature 98.7 F (37.1 C), temperature source Oral, resp. rate 18, height '5\' 6"'  (1.676 m), weight 205 lb 12.8 oz (93.4 kg), SpO2 98 %.    HEENT: No thrush GI: No hepatomegaly, nontender, left lower quadrant colostomy Vascular: No leg edema   Portacath/PICC-without erythema  Lab Results:  Lab Results  Component Value Date   WBC 7.2 01/17/2019   HGB 13.2 01/17/2019   HCT 40.7 01/17/2019   MCV 89.3 01/17/2019   PLT 161 01/17/2019   NEUTROABS 4.4 01/17/2019    CMP  Lab Results  Component Value Date   NA 141 01/17/2019   K 3.6 01/17/2019   CL 104 01/17/2019   CO2 27 01/17/2019   GLUCOSE 125 (H) 01/17/2019   BUN 20 01/17/2019   CREATININE 0.89 01/17/2019   CALCIUM 8.9 01/17/2019   PROT 6.9 01/17/2019   ALBUMIN 3.8 01/17/2019   AST 30 01/17/2019   ALT 30 01/17/2019   ALKPHOS 50 01/17/2019   BILITOT 0.4 01/17/2019   GFRNONAA >60 01/17/2019   GFRAA >60 01/17/2019   CA 19-9: 11 on 01/03/2019  Medications: I have reviewed the patient's current medications.   Assessment/Plan: 1. 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 Well-differentiated adenocarcinoma, 0/13 lymph nodes positive, MSI-stable, no loss of mismatch repair protein expression ? Tumor involving the rectosigmoid junction ? Sigmoidoscopy 09/18/2017-distal sigmoid colon tumor beginning between 15 and 20 cm from the dentate line, completely obstructing ? CT abdomen/pelvis 09/14/2017-sigmoid  thickening no evidence of metastatic disease ? 2 mm right upper lobe nodule on CT 08/04/2017 ? Colonoscopy 02/07/2018-polyps removed from the cecum, ascending colon, transverse colon, descending colon, rectosigmoid colon and rectum (tubular adenomas)  2. Right lung pneumonia/empyema March 2019  Right VATS, drainage of empyema, and decortication of the right lower lobe 08/31/2017  3.CVA in 2010  4.Rectal and active sigmoid polyps noted on flexible sigmoidoscopy 09/18/2017  5.Pancreas tail mass/right liver lesions and 4 mm splenic lesion on CT abdomen/pelvis 05/27/2018  MRI abdomen 07/27/2018-right liver lesions, rim-enhancing lesion in the pancreas tail  Ultrasound-guided biopsy of a right liver lesion 08/14/2018-metastatic adenocarcinoma, cytokeratin 7+, CDX-2 weak positive, cytokeratin 20 negative, consistent with metastatic pancreas cancer; MSI stable; tumor mutational burden 0  CT abdomen/pelvis 09/26/2018-increased size of previously noted liver lesions, new right liver lesion, 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  Cycle 6 gemcitabine/Abraxane 12/20/2018  Cycle 7 gemcitabine/Abraxane 01/03/2019  Cycle 8 gemcitabine/Abraxane 01/17/2019  6.Port-A-Cath placement by Dr. Connor4/24/2020   Disposition: Alex [name] appears stable.  He will complete another cycle of gemcitabine/Abraxane today.  He will return for an office visit and chemotherapy in 2 weeks.  We will plan for a restaging CT evaluation  after cycle 10.  Betsy Coder, MD  01/17/2019  11:52 AM

## 2019-01-17 NOTE — Patient Instructions (Signed)
[facility] Discharge Instructions for Patients Receiving Chemotherapy  Today you received the following chemotherapy agents Abraxane, Gemzar  To help prevent nausea and vomiting after your treatment, we encourage you to take your nausea medication as prescribed.   If you develop nausea and vomiting that is not controlled by your nausea medication, call the clinic.   BELOW ARE SYMPTOMS THAT SHOULD BE REPORTED IMMEDIATELY:  *FEVER GREATER THAN 100.5 F  *CHILLS WITH OR WITHOUT FEVER  NAUSEA AND VOMITING THAT IS NOT CONTROLLED WITH YOUR NAUSEA MEDICATION  *UNUSUAL SHORTNESS OF BREATH  *UNUSUAL BRUISING OR BLEEDING  TENDERNESS IN MOUTH AND THROAT WITH OR WITHOUT PRESENCE OF ULCERS  *URINARY PROBLEMS  *BOWEL PROBLEMS  UNUSUAL RASH Items with * indicate a potential emergency and should be followed up as soon as possible.  Feel free to call the clinic should you have any questions or concerns. The clinic phone number is [phone].  Please show the CHEMO ALERT CARD at check-in to the Emergency Department and triage nurse.   

## 2019-01-18 LAB — CANCER ANTIGEN 19-9: CA 19-9: 11 U/mL (ref 0–35)

## 2019-01-20 ENCOUNTER — Telehealth: Payer: Self-pay | Admitting: Oncology

## 2019-01-20 NOTE — Telephone Encounter (Signed)
Called and left msg.mailed printout  °

## 2019-01-25 ENCOUNTER — Other Ambulatory Visit: Payer: Self-pay | Admitting: Oncology

## 2019-01-31 ENCOUNTER — Inpatient Hospital Stay: Payer: Self-pay | Attending: Nurse Practitioner | Admitting: Nurse Practitioner

## 2019-01-31 ENCOUNTER — Telehealth: Payer: Self-pay | Admitting: Oncology

## 2019-01-31 ENCOUNTER — Other Ambulatory Visit: Payer: Self-pay

## 2019-01-31 ENCOUNTER — Encounter: Payer: Self-pay | Admitting: Nurse Practitioner

## 2019-01-31 ENCOUNTER — Inpatient Hospital Stay: Payer: Self-pay

## 2019-01-31 VITALS — BP 135/85 | HR 61 | Temp 98.2°F | Resp 17 | Ht 66.0 in | Wt 207.3 lb

## 2019-01-31 DIAGNOSIS — Z95828 Presence of other vascular implants and grafts: Secondary | ICD-10-CM

## 2019-01-31 DIAGNOSIS — C252 Malignant neoplasm of tail of pancreas: Secondary | ICD-10-CM

## 2019-01-31 DIAGNOSIS — R202 Paresthesia of skin: Secondary | ICD-10-CM | POA: Insufficient documentation

## 2019-01-31 DIAGNOSIS — Z23 Encounter for immunization: Secondary | ICD-10-CM | POA: Insufficient documentation

## 2019-01-31 DIAGNOSIS — C19 Malignant neoplasm of rectosigmoid junction: Secondary | ICD-10-CM | POA: Insufficient documentation

## 2019-01-31 DIAGNOSIS — C787 Secondary malignant neoplasm of liver and intrahepatic bile duct: Secondary | ICD-10-CM | POA: Insufficient documentation

## 2019-01-31 DIAGNOSIS — Z5111 Encounter for antineoplastic chemotherapy: Secondary | ICD-10-CM | POA: Insufficient documentation

## 2019-01-31 DIAGNOSIS — Z8673 Personal history of transient ischemic attack (TIA), and cerebral infarction without residual deficits: Secondary | ICD-10-CM | POA: Insufficient documentation

## 2019-01-31 LAB — CBC WITH DIFFERENTIAL (CANCER CENTER ONLY)
Abs Immature Granulocytes: 0.05 10*3/uL (ref 0.00–0.07)
Basophils Absolute: 0.1 10*3/uL (ref 0.0–0.1)
Basophils Relative: 1 %
Eosinophils Absolute: 0.4 10*3/uL (ref 0.0–0.5)
Eosinophils Relative: 6 %
HCT: 41 % (ref 39.0–52.0)
Hemoglobin: 13 g/dL (ref 13.0–17.0)
Immature Granulocytes: 1 %
Lymphocytes Relative: 20 %
Lymphs Abs: 1.2 10*3/uL (ref 0.7–4.0)
MCH: 28.7 pg (ref 26.0–34.0)
MCHC: 31.7 g/dL (ref 30.0–36.0)
MCV: 90.5 fL (ref 80.0–100.0)
Monocytes Absolute: 1.2 10*3/uL — ABNORMAL HIGH (ref 0.1–1.0)
Monocytes Relative: 20 %
Neutro Abs: 3.2 10*3/uL (ref 1.7–7.7)
Neutrophils Relative %: 52 %
Platelet Count: 183 10*3/uL (ref 150–400)
RBC: 4.53 MIL/uL (ref 4.22–5.81)
RDW: 13.9 % (ref 11.5–15.5)
WBC Count: 6.1 10*3/uL (ref 4.0–10.5)
nRBC: 0 % (ref 0.0–0.2)

## 2019-01-31 LAB — CMP (CANCER CENTER ONLY)
ALT: 32 U/L (ref 0–44)
AST: 31 U/L (ref 15–41)
Albumin: 3.8 g/dL (ref 3.5–5.0)
Alkaline Phosphatase: 49 U/L (ref 38–126)
Anion gap: 9 (ref 5–15)
BUN: 17 mg/dL (ref 8–23)
CO2: 28 mmol/L (ref 22–32)
Calcium: 9.1 mg/dL (ref 8.9–10.3)
Chloride: 103 mmol/L (ref 98–111)
Creatinine: 0.86 mg/dL (ref 0.61–1.24)
GFR, Est AFR Am: >60 mL/min (ref >60)
GFR, Estimated: >60 mL/min (ref >60)
Glucose, Bld: 117 mg/dL — ABNORMAL HIGH (ref 70–99)
Potassium: 3.7 mmol/L (ref 3.5–5.1)
Sodium: 140 mmol/L (ref 135–145)
Total Bilirubin: 0.4 mg/dL (ref 0.3–1.2)
Total Protein: 6.8 g/dL (ref 6.5–8.1)

## 2019-01-31 MED ORDER — SODIUM CHLORIDE 0.9 % IV SOLN
2000.0000 mg | Freq: Once | INTRAVENOUS | Status: AC
  Administered 2019-01-31: 2000 mg via INTRAVENOUS
  Filled 2019-01-31: qty 52.6

## 2019-01-31 MED ORDER — SODIUM CHLORIDE 0.9% FLUSH
10.0000 mL | INTRAVENOUS | Status: DC | PRN
  Administered 2019-01-31: 09:00:00 10 mL
  Filled 2019-01-31: qty 10

## 2019-01-31 MED ORDER — SODIUM CHLORIDE 0.9% FLUSH
10.0000 mL | INTRAVENOUS | Status: DC | PRN
  Administered 2019-01-31: 10 mL
  Filled 2019-01-31: qty 10

## 2019-01-31 MED ORDER — PROCHLORPERAZINE MALEATE 10 MG PO TABS
10.0000 mg | ORAL_TABLET | Freq: Once | ORAL | Status: AC
  Administered 2019-01-31: 10 mg via ORAL

## 2019-01-31 MED ORDER — PACLITAXEL PROTEIN-BOUND CHEMO INJECTION 100 MG
125.0000 mg/m2 | Freq: Once | INTRAVENOUS | Status: AC
  Administered 2019-01-31: 12:00:00 250 mg via INTRAVENOUS
  Filled 2019-01-31: qty 50

## 2019-01-31 MED ORDER — PROCHLORPERAZINE MALEATE 10 MG PO TABS
ORAL_TABLET | ORAL | Status: AC
  Filled 2019-01-31: qty 1

## 2019-01-31 MED ORDER — HEPARIN SOD (PORK) LOCK FLUSH 100 UNIT/ML IV SOLN
500.0000 [IU] | Freq: Once | INTRAVENOUS | Status: AC | PRN
  Administered 2019-01-31: 13:00:00 500 [IU]
  Filled 2019-01-31: qty 5

## 2019-01-31 MED ORDER — SODIUM CHLORIDE 0.9 % IV SOLN
Freq: Once | INTRAVENOUS | Status: AC
  Administered 2019-01-31: 10:00:00 via INTRAVENOUS
  Filled 2019-01-31: qty 250

## 2019-01-31 NOTE — Patient Instructions (Signed)
[facility] Discharge Instructions for Patients Receiving Chemotherapy  Today you received the following chemotherapy agents :  Abraxane,  Gemcitabine.  To help prevent nausea and vomiting after your treatment, we encourage you to take your nausea medication as prescribed.   If you develop nausea and vomiting that is not controlled by your nausea medication, call the clinic.   BELOW ARE SYMPTOMS THAT SHOULD BE REPORTED IMMEDIATELY:  *FEVER GREATER THAN 100.5 F  *CHILLS WITH OR WITHOUT FEVER  NAUSEA AND VOMITING THAT IS NOT CONTROLLED WITH YOUR NAUSEA MEDICATION  *UNUSUAL SHORTNESS OF BREATH  *UNUSUAL BRUISING OR BLEEDING  TENDERNESS IN MOUTH AND THROAT WITH OR WITHOUT PRESENCE OF ULCERS  *URINARY PROBLEMS  *BOWEL PROBLEMS  UNUSUAL RASH Items with * indicate a potential emergency and should be followed up as soon as possible.  Feel free to call the clinic should you have any questions or concerns. The clinic phone number is [phone].  Please show the [facility] at check-in to the Emergency Department and triage nurse.

## 2019-01-31 NOTE — Telephone Encounter (Signed)
Called and left msg. Mailed printout  °

## 2019-01-31 NOTE — Progress Notes (Signed)
[place] OFFICE PROGRESS NOTE   Diagnosis: Pancreas cancer  INTERVAL HISTORY:   Alex [name] returns as scheduled.  He completed another cycle of gemcitabine/Abraxane 01/17/2019.  He feels well.  He has a good appetite.  He denies pain.  No nausea or vomiting.  No mouth sores.  No diarrhea.  No skin rash or fever following treatment.  He has intermittent numbness in the toes in the morning hours.  The numbness resolves with walking.  He has intermittent numbness in the right arm.  Objective:  Vital signs in last 24 hours:  Blood pressure 135/85, pulse 61, temperature 98.2 F (36.8 C), temperature source Temporal, resp. rate 17, height _0  (1.676 m), weight 207 lb 4.8 oz (94 kg), SpO2 99 %.    HEENT: No thrush or ulcers. GI: Abdomen soft and nontender.  No hepatomegaly.  No apparent ascites.  Left lower quadrant colostomy. Vascular: No leg edema. Neuro: Vibratory sense moderately decreased over the fingertips per tuning fork exam. Skin: No rash. Port-A-Cath without erythema.   Lab Results:  Lab Results  Component Value Date   WBC 6.1 01/31/2019   HGB 13.0 01/31/2019   HCT 41.0 01/31/2019   MCV 90.5 01/31/2019   PLT 183 01/31/2019   NEUTROABS 3.2 01/31/2019    Imaging:  No results found.  Medications: I have reviewed the patient's current medications.  Assessment/Plan: 1. 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 Well-differentiated adenocarcinoma, 0/13 lymph nodes positive, MSI-stable, no loss of mismatch repair protein expression ? Tumor involving the rectosigmoid junction ? Sigmoidoscopy 09/18/2017-distal sigmoid colon tumor beginning between 15 and 20 cm from the dentate line, completely obstructing ? CT abdomen/pelvis 09/14/2017-sigmoid thickening no evidence of metastatic disease ? 2 mm right upper lobe nodule on CT 08/04/2017 ? Colonoscopy 02/07/2018-polyps removed  from the cecum, ascending colon, transverse colon, descending colon, rectosigmoid colon and rectum (tubular adenomas)  2. Right lung pneumonia/empyema March 2019  Right VATS, drainage of empyema, and decortication of the right lower lobe 08/31/2017  3.CVA in 2010  4.Rectal and active sigmoid polyps noted on flexible sigmoidoscopy 09/18/2017  5.Pancreas tail mass/right liver lesions and 4 mm splenic lesion on CT abdomen/pelvis 05/27/2018  MRI abdomen 07/27/2018-right liver lesions, rim-enhancing lesion in the pancreas tail  Ultrasound-guided biopsy of a right liver lesion 08/14/2018-metastatic adenocarcinoma, cytokeratin 7+, CDX-2 weak positive, cytokeratin 20 negative, consistent with metastatic pancreas cancer; MSI stable; tumor mutational burden 0  CT abdomen/pelvis 09/26/2018-increased size of previously noted liver lesions, new right liver lesion, 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  Cycle 6 gemcitabine/Abraxane 12/20/2018  Cycle 7 gemcitabine/Abraxane 01/03/2019  Cycle 8 gemcitabine/Abraxane 01/17/2019  Cycle 9 gemcitabine/Abraxane 01/31/2019  6.Port-A-Cath placement by Dr. Connor4/24/2020   Disposition: Alex [name] appears well.  He has completed 8 cycles of gemcitabine/Abraxane.  There is no clinical evidence of disease progression.  Most recent CA-19-9 was stable in the normal range.  Plan to proceed with cycle 9 gemcitabine/Abraxane today as scheduled.  He will undergo restaging CTs after completing 10 cycles.  We reviewed the CBC from today.  Counts adequate to proceed with treatment.   He will return for lab, follow-up, gemcitabine/Abraxane  in 2 weeks.  He will contact the office in the interim with any problems.    Ned Card ANP/GNP-BC   01/31/2019  9:28 AM

## 2019-02-09 ENCOUNTER — Other Ambulatory Visit: Payer: Self-pay | Admitting: Oncology

## 2019-02-13 ENCOUNTER — Inpatient Hospital Stay: Payer: Self-pay

## 2019-02-13 ENCOUNTER — Inpatient Hospital Stay (HOSPITAL_BASED_OUTPATIENT_CLINIC_OR_DEPARTMENT_OTHER): Payer: Self-pay | Admitting: Oncology

## 2019-02-13 ENCOUNTER — Other Ambulatory Visit: Payer: Self-pay

## 2019-02-13 VITALS — BP 124/77 | HR 75 | Temp 98.3°F | Resp 16 | Ht 66.0 in | Wt 210.1 lb

## 2019-02-13 DIAGNOSIS — C252 Malignant neoplasm of tail of pancreas: Secondary | ICD-10-CM

## 2019-02-13 DIAGNOSIS — Z23 Encounter for immunization: Secondary | ICD-10-CM

## 2019-02-13 DIAGNOSIS — Z95828 Presence of other vascular implants and grafts: Secondary | ICD-10-CM

## 2019-02-13 DIAGNOSIS — C259 Malignant neoplasm of pancreas, unspecified: Secondary | ICD-10-CM

## 2019-02-13 LAB — CBC WITH DIFFERENTIAL (CANCER CENTER ONLY)
Abs Immature Granulocytes: 0.04 10*3/uL (ref 0.00–0.07)
Basophils Absolute: 0 10*3/uL (ref 0.0–0.1)
Basophils Relative: 1 %
Eosinophils Absolute: 0.4 10*3/uL (ref 0.0–0.5)
Eosinophils Relative: 6 %
HCT: 39.6 % (ref 39.0–52.0)
Hemoglobin: 12.5 g/dL — ABNORMAL LOW (ref 13.0–17.0)
Immature Granulocytes: 1 %
Lymphocytes Relative: 19 %
Lymphs Abs: 1.2 10*3/uL (ref 0.7–4.0)
MCH: 28.7 pg (ref 26.0–34.0)
MCHC: 31.6 g/dL (ref 30.0–36.0)
MCV: 90.8 fL (ref 80.0–100.0)
Monocytes Absolute: 1.2 10*3/uL — ABNORMAL HIGH (ref 0.1–1.0)
Monocytes Relative: 19 %
Neutro Abs: 3.3 10*3/uL (ref 1.7–7.7)
Neutrophils Relative %: 54 %
Platelet Count: 172 10*3/uL (ref 150–400)
RBC: 4.36 MIL/uL (ref 4.22–5.81)
RDW: 13.7 % (ref 11.5–15.5)
WBC Count: 6.1 10*3/uL (ref 4.0–10.5)
nRBC: 0 % (ref 0.0–0.2)

## 2019-02-13 LAB — CMP (CANCER CENTER ONLY)
ALT: 27 U/L (ref 0–44)
AST: 30 U/L (ref 15–41)
Albumin: 3.8 g/dL (ref 3.5–5.0)
Alkaline Phosphatase: 52 U/L (ref 38–126)
Anion gap: 7 (ref 5–15)
BUN: 19 mg/dL (ref 8–23)
CO2: 30 mmol/L (ref 22–32)
Calcium: 8.9 mg/dL (ref 8.9–10.3)
Chloride: 103 mmol/L (ref 98–111)
Creatinine: 0.9 mg/dL (ref 0.61–1.24)
GFR, Est AFR Am: >60 mL/min (ref >60)
GFR, Estimated: >60 mL/min (ref >60)
Glucose, Bld: 158 mg/dL — ABNORMAL HIGH (ref 70–99)
Potassium: 3.5 mmol/L (ref 3.5–5.1)
Sodium: 140 mmol/L (ref 135–145)
Total Bilirubin: 0.4 mg/dL (ref 0.3–1.2)
Total Protein: 6.7 g/dL (ref 6.5–8.1)

## 2019-02-13 MED ORDER — SODIUM CHLORIDE 0.9% FLUSH
10.0000 mL | INTRAVENOUS | Status: DC | PRN
  Administered 2019-02-13: 10:00:00 10 mL
  Filled 2019-02-13: qty 10

## 2019-02-13 MED ORDER — SODIUM CHLORIDE 0.9 % IV SOLN
2000.0000 mg | Freq: Once | INTRAVENOUS | Status: AC
  Administered 2019-02-13: 14:00:00 2000 mg via INTRAVENOUS
  Filled 2019-02-13: qty 52.6

## 2019-02-13 MED ORDER — HEPARIN SOD (PORK) LOCK FLUSH 100 UNIT/ML IV SOLN
500.0000 [IU] | Freq: Once | INTRAVENOUS | Status: DC | PRN
  Filled 2019-02-13: qty 5

## 2019-02-13 MED ORDER — PACLITAXEL PROTEIN-BOUND CHEMO INJECTION 100 MG
125.0000 mg/m2 | Freq: Once | INTRAVENOUS | Status: AC
  Administered 2019-02-13: 13:00:00 250 mg via INTRAVENOUS
  Filled 2019-02-13: qty 50

## 2019-02-13 MED ORDER — INFLUENZA VAC A&B SA ADJ QUAD 0.5 ML IM PRSY
0.5000 mL | PREFILLED_SYRINGE | Freq: Once | INTRAMUSCULAR | Status: AC
  Administered 2019-02-13: 0.5 mL via INTRAMUSCULAR

## 2019-02-13 MED ORDER — SODIUM CHLORIDE 0.9% FLUSH
10.0000 mL | INTRAVENOUS | Status: DC | PRN
  Filled 2019-02-13: qty 10

## 2019-02-13 MED ORDER — SODIUM CHLORIDE 0.9 % IV SOLN
Freq: Once | INTRAVENOUS | Status: AC
  Administered 2019-02-13: 12:00:00 via INTRAVENOUS
  Filled 2019-02-13: qty 250

## 2019-02-13 MED ORDER — PROCHLORPERAZINE MALEATE 10 MG PO TABS
10.0000 mg | ORAL_TABLET | Freq: Once | ORAL | Status: AC
  Administered 2019-02-13: 10 mg via ORAL

## 2019-02-13 MED ORDER — INFLUENZA VAC A&B SA ADJ QUAD 0.5 ML IM PRSY
PREFILLED_SYRINGE | INTRAMUSCULAR | Status: AC
  Filled 2019-02-13: qty 0.5

## 2019-02-13 NOTE — Patient Instructions (Signed)
[facility] Discharge Instructions for Patients Receiving Chemotherapy  Today you received the following chemotherapy agents: Abraxane, Gemzar   To help prevent nausea and vomiting after your treatment, we encourage you to take your nausea medication as directed.    If you develop nausea and vomiting that is not controlled by your nausea medication, call the clinic.   BELOW ARE SYMPTOMS THAT SHOULD BE REPORTED IMMEDIATELY:  *FEVER GREATER THAN 100.5 F  *CHILLS WITH OR WITHOUT FEVER  NAUSEA AND VOMITING THAT IS NOT CONTROLLED WITH YOUR NAUSEA MEDICATION  *UNUSUAL SHORTNESS OF BREATH  *UNUSUAL BRUISING OR BLEEDING  TENDERNESS IN MOUTH AND THROAT WITH OR WITHOUT PRESENCE OF ULCERS  *URINARY PROBLEMS  *BOWEL PROBLEMS  UNUSUAL RASH Items with * indicate a potential emergency and should be followed up as soon as possible.  Feel free to call the clinic should you have any questions or concerns. The clinic phone number is [phone].  Please show the CHEMO ALERT CARD at check-in to the Emergency Department and triage nurse.   

## 2019-02-13 NOTE — Progress Notes (Signed)
[place] OFFICE PROGRESS NOTE   Diagnosis: Pancreas cancer  INTERVAL HISTORY:   Alex [name] completed another cycle of gemcitabine and Abraxane on 01/31/2019.  He feels well.  Good appetite.  No fever or rash.  He has numbness in the right thumb.  He has intermittent tingling in the feet.  This does not interfere with activity.  He has altered taste.  Objective:  Vital signs in last 24 hours:  Blood pressure 124/77, pulse 75, temperature 98.3 F (36.8 C), temperature source Temporal, resp. rate 16, height '5\' 6"'$  (1.676 m), weight 210 lb 1.6 oz (95.3 kg), SpO2 99 %.     HEENT: No thrush or ulcers GI: No hepatomegaly, nontender, no mass Vascular: No leg edema   Portacath/PICC-without erythema  Lab Results:  Lab Results  Component Value Date   WBC 6.1 02/13/2019   HGB 12.5 (L) 02/13/2019   HCT 39.6 02/13/2019   MCV 90.8 02/13/2019   PLT 172 02/13/2019   NEUTROABS 3.3 02/13/2019    CMP  Lab Results  Component Value Date   NA 140 01/31/2019   K 3.7 01/31/2019   CL 103 01/31/2019   CO2 28 01/31/2019   GLUCOSE 117 (H) 01/31/2019   BUN 17 01/31/2019   CREATININE 0.86 01/31/2019   CALCIUM 9.1 01/31/2019   PROT 6.8 01/31/2019   ALBUMIN 3.8 01/31/2019   AST 31 01/31/2019   ALT 32 01/31/2019   ALKPHOS 49 01/31/2019   BILITOT 0.4 01/31/2019   GFRNONAA >60 01/31/2019   GFRAA >60 01/31/2019    Medications: I have reviewed the patient's current medications.   Assessment/Plan:  1. 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 Well-differentiated adenocarcinoma, 0/13 lymph nodes positive, MSI-stable, no loss of mismatch repair protein expression ? Tumor involving the rectosigmoid junction ? Sigmoidoscopy 09/18/2017-distal sigmoid colon tumor beginning between 15 and 20 cm from the dentate line, completely obstructing ? CT abdomen/pelvis 09/14/2017-sigmoid thickening no evidence  of metastatic disease ? 2 mm right upper lobe nodule on CT 08/04/2017 ? Colonoscopy 02/07/2018-polyps removed from the cecum, ascending colon, transverse colon, descending colon, rectosigmoid colon and rectum (tubular adenomas)  2. Right lung pneumonia/empyema March 2019  Right VATS, drainage of empyema, and decortication of the right lower lobe 08/31/2017  3.CVA in 2010  4.Rectal and active sigmoid polyps noted on flexible sigmoidoscopy 09/18/2017  5.Pancreas tail mass/right liver lesions and 4 mm splenic lesion on CT abdomen/pelvis 05/27/2018  MRI abdomen 07/27/2018-right liver lesions, rim-enhancing lesion in the pancreas tail  Ultrasound-guided biopsy of a right liver lesion 08/14/2018-metastatic adenocarcinoma, cytokeratin 7+, CDX-2 weak positive, cytokeratin 20 negative, consistent with metastatic pancreas cancer; MSI stable; tumor mutational burden 0  CT abdomen/pelvis 09/26/2018-increased size of previously noted liver lesions, new right liver lesion, 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  Cycle 6 gemcitabine/Abraxane 12/20/2018  Cycle 7 gemcitabine/Abraxane 01/03/2019  Cycle 8 gemcitabine/Abraxane 01/17/2019  Cycle 9 gemcitabine/Abraxane 01/31/2019  Cycle 10 gemcitabine/Abraxane 02/13/2019  6.Port-A-Cath placement by Dr. Connor4/24/2020   Disposition: Mr. Ehrler appears unchanged.  The CA 19-9 has improved.  There is no clinical evidence for progression of the pancreas cancer.  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  He will call for increased neuropathy symptoms.   He will complete another cycle of gemcitabine/Abraxane today.  Mr. Vigo will return for an office visit and chemotherapy in 2 weeks.  Betsy Coder, MD  02/13/2019  10:25 AM

## 2019-02-14 ENCOUNTER — Telehealth: Payer: Self-pay | Admitting: Oncology

## 2019-02-14 LAB — CANCER ANTIGEN 19-9: CA 19-9: 12 U/mL (ref 0–35)

## 2019-02-14 NOTE — Telephone Encounter (Signed)
Called and left msg. Mailed printout  °

## 2019-02-21 ENCOUNTER — Other Ambulatory Visit: Payer: Self-pay | Admitting: Oncology

## 2019-02-28 ENCOUNTER — Inpatient Hospital Stay: Payer: Self-pay

## 2019-02-28 ENCOUNTER — Other Ambulatory Visit: Payer: Self-pay

## 2019-02-28 ENCOUNTER — Inpatient Hospital Stay: Payer: Self-pay | Attending: Nurse Practitioner | Admitting: Oncology

## 2019-02-28 VITALS — BP 128/81 | HR 61 | Temp 98.3°F | Resp 18 | Ht 66.0 in | Wt 206.9 lb

## 2019-02-28 DIAGNOSIS — C787 Secondary malignant neoplasm of liver and intrahepatic bile duct: Secondary | ICD-10-CM | POA: Insufficient documentation

## 2019-02-28 DIAGNOSIS — C252 Malignant neoplasm of tail of pancreas: Secondary | ICD-10-CM

## 2019-02-28 DIAGNOSIS — G620 Drug-induced polyneuropathy: Secondary | ICD-10-CM | POA: Insufficient documentation

## 2019-02-28 DIAGNOSIS — Z95828 Presence of other vascular implants and grafts: Secondary | ICD-10-CM

## 2019-02-28 DIAGNOSIS — C19 Malignant neoplasm of rectosigmoid junction: Secondary | ICD-10-CM | POA: Insufficient documentation

## 2019-02-28 DIAGNOSIS — Z5111 Encounter for antineoplastic chemotherapy: Secondary | ICD-10-CM | POA: Insufficient documentation

## 2019-02-28 LAB — CBC WITH DIFFERENTIAL (CANCER CENTER ONLY)
Abs Immature Granulocytes: 0.03 10*3/uL (ref 0.00–0.07)
Basophils Absolute: 0.1 10*3/uL (ref 0.0–0.1)
Basophils Relative: 1 %
Eosinophils Absolute: 0.2 10*3/uL (ref 0.0–0.5)
Eosinophils Relative: 4 %
HCT: 41.4 % (ref 39.0–52.0)
Hemoglobin: 13 g/dL (ref 13.0–17.0)
Immature Granulocytes: 1 %
Lymphocytes Relative: 21 %
Lymphs Abs: 1.2 10*3/uL (ref 0.7–4.0)
MCH: 27.7 pg (ref 26.0–34.0)
MCHC: 31.4 g/dL (ref 30.0–36.0)
MCV: 88.1 fL (ref 80.0–100.0)
Monocytes Absolute: 1.3 10*3/uL — ABNORMAL HIGH (ref 0.1–1.0)
Monocytes Relative: 22 %
Neutro Abs: 3.1 10*3/uL (ref 1.7–7.7)
Neutrophils Relative %: 51 %
Platelet Count: 215 10*3/uL (ref 150–400)
RBC: 4.7 MIL/uL (ref 4.22–5.81)
RDW: 13.8 % (ref 11.5–15.5)
WBC Count: 6 10*3/uL (ref 4.0–10.5)
nRBC: 0 % (ref 0.0–0.2)

## 2019-02-28 LAB — CMP (CANCER CENTER ONLY)
ALT: 25 U/L (ref 0–44)
AST: 30 U/L (ref 15–41)
Albumin: 3.7 g/dL (ref 3.5–5.0)
Alkaline Phosphatase: 51 U/L (ref 38–126)
Anion gap: 7 (ref 5–15)
BUN: 16 mg/dL (ref 8–23)
CO2: 28 mmol/L (ref 22–32)
Calcium: 8.8 mg/dL — ABNORMAL LOW (ref 8.9–10.3)
Chloride: 105 mmol/L (ref 98–111)
Creatinine: 0.83 mg/dL (ref 0.61–1.24)
GFR, Est AFR Am: >60 mL/min (ref >60)
GFR, Estimated: >60 mL/min (ref >60)
Glucose, Bld: 106 mg/dL — ABNORMAL HIGH (ref 70–99)
Potassium: 3.8 mmol/L (ref 3.5–5.1)
Sodium: 140 mmol/L (ref 135–145)
Total Bilirubin: 0.5 mg/dL (ref 0.3–1.2)
Total Protein: 6.5 g/dL (ref 6.5–8.1)

## 2019-02-28 MED ORDER — SODIUM CHLORIDE 0.9% FLUSH
10.0000 mL | INTRAVENOUS | Status: DC | PRN
  Administered 2019-02-28: 10:00:00 10 mL
  Filled 2019-02-28: qty 10

## 2019-02-28 MED ORDER — SODIUM CHLORIDE 0.9 % IV SOLN
Freq: Once | INTRAVENOUS | Status: AC
  Administered 2019-02-28: 12:00:00 via INTRAVENOUS
  Filled 2019-02-28: qty 250

## 2019-02-28 MED ORDER — PROCHLORPERAZINE MALEATE 10 MG PO TABS
10.0000 mg | ORAL_TABLET | Freq: Once | ORAL | Status: AC
  Administered 2019-02-28: 12:00:00 10 mg via ORAL

## 2019-02-28 MED ORDER — PROCHLORPERAZINE MALEATE 10 MG PO TABS
ORAL_TABLET | ORAL | Status: AC
  Filled 2019-02-28: qty 1

## 2019-02-28 MED ORDER — SODIUM CHLORIDE 0.9 % IV SOLN
2000.0000 mg | Freq: Once | INTRAVENOUS | Status: AC
  Administered 2019-02-28: 2000 mg via INTRAVENOUS
  Filled 2019-02-28: qty 52.6

## 2019-02-28 MED ORDER — SODIUM CHLORIDE 0.9% FLUSH
10.0000 mL | INTRAVENOUS | Status: DC | PRN
  Administered 2019-02-28: 13:00:00 10 mL
  Filled 2019-02-28: qty 10

## 2019-02-28 MED ORDER — HEPARIN SOD (PORK) LOCK FLUSH 100 UNIT/ML IV SOLN
500.0000 [IU] | Freq: Once | INTRAVENOUS | Status: AC | PRN
  Administered 2019-02-28: 500 [IU]
  Filled 2019-02-28: qty 5

## 2019-02-28 NOTE — Progress Notes (Signed)
[name] OFF[facility] PROGRESS NOTE   Diagnosis: Pancreas cancer  INTERVAL HISTORY:   Alex [name] completed another cycle of gemcitabine and Abraxane on 02/13/2019.  No fever or rash.  Alex [name] feels well.  No pain.  Good appetite.  Alex [name] has noted increased tingling in the distal fingers over the past several weeks.  Alex [name] had numbness in the right thumb prior to beginning treatment.  Alex [name] also reports mild numbness in the feet.  This does not interfere with activity.  Objective:  Vital signs in last 24 hours:  Blood pressure 128/81, pulse 61, temperature 98.3 F (36.8 C), temperature source Temporal, resp. rate 18, height 5' 6" (1.676 m), weight 206 lb 14.4 oz (93.8 kg), SpO2 100 %.    Limited physical examination secondary to distancing with the COVID pandemic GI: No hepatomegaly, no mass, nontender Vascular: No leg edema Neuro: Mild to moderate loss of vibratory sense at the fingertips bilaterally   Portacath/PICC-without erythema  Lab Results:  Lab Results  Component Value Date   WBC 6.0 02/28/2019   HGB 13.0 02/28/2019   HCT 41.4 02/28/2019   MCV 88.1 02/28/2019   PLT 215 02/28/2019   NEUTROABS 3.1 02/28/2019    CMP  Lab Results  Component Value Date   NA 140 02/28/2019   K 3.8 02/28/2019   CL 105 02/28/2019   CO2 28 02/28/2019   GLUCOSE 106 (H) 02/28/2019   BUN 16 02/28/2019   CREATININE 0.83 02/28/2019   CALCIUM 8.8 (L) 02/28/2019   PROT 6.5 02/28/2019   ALBUMIN 3.7 02/28/2019   AST 30 02/28/2019   ALT 25 02/28/2019   ALKPHOS 51 02/28/2019   BILITOT 0.5 02/28/2019   GFRNONAA >60 02/28/2019   GFRAA >60 02/28/2019   CA 19-9 on 02/13/2019: 12 Medications: I have reviewed the patient's current medications.   Assessment/Plan: 1. 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 Well-differentiated adenocarcinoma, 0/13 lymph nodes positive, MSI-stable, no loss of mismatch repair  protein expression ? Tumor involving the rectosigmoid junction ? Sigmoidoscopy 09/18/2017-distal sigmoid colon tumor beginning between 15 and 20 cm from the dentate line, completely obstructing ? CT abdomen/pelvis 09/14/2017-sigmoid thickening no evidence of metastatic disease ? 2 mm right upper lobe nodule on CT 08/04/2017 ? Colonoscopy 02/07/2018-polyps removed from the cecum, ascending colon, transverse colon, descending colon, rectosigmoid colon and rectum (tubular adenomas)  2. Right lung pneumonia/empyema March 2019  Right VATS, drainage of empyema, and decortication of the right lower lobe 08/31/2017  3.CVA in 2010  4.Rectal and active sigmoid polyps noted on flexible sigmoidoscopy 09/18/2017  5.Pancreas tail mass/right liver lesions and 4 mm splenic lesion on CT abdomen/pelvis 05/27/2018  MRI abdomen 07/27/2018-right liver lesions, rim-enhancing lesion in the pancreas tail  Ultrasound-guided biopsy of a right liver lesion 08/14/2018-metastatic adenocarcinoma, cytokeratin 7+, CDX-2 weak positive, cytokeratin 20 negative, consistent with metastatic pancreas cancer; MSI stable; tumor mutational burden 0  CT abdomen/pelvis 09/26/2018-increased size of previously noted liver lesions, new right liver lesion, 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  Cycle 6 gemcitabine/Abraxane 12/20/2018  Cycle 7 gemcitabine/Abraxane 01/03/2019  Cycle 8 gemcitabine/Abraxane 01/17/2019  Cycle 9 gemcitabine/Abraxane 01/31/2019  Cycle 10 gemcitabine/Abraxane 02/13/2019  Cycle 11 gemcitabine/Abraxane 02/28/2019 (Abraxane held  secondary to neuropathy symptoms)  6.Port-A-Cath placement by Dr. Connor4/24/2020     Disposition: Alex [name] appears well.  Alex [name] has developed increased neuropathy symptoms.  Abraxane will be held from the chemotherapy regimen today.  Alex [name] will complete a cycle of gemcitabine today.  Alex [name] will return for an office visit and chemotherapy in 2 weeks.  We will plan for a restaging CT evaluation after the cycle of chemotherapy on 03/14/2019.   Betsy Coder, MD  02/28/2019  3:29 PM

## 2019-02-28 NOTE — Patient Instructions (Signed)

## 2019-02-28 NOTE — Patient Instructions (Signed)
Gemcitabine injection What is this medicine? GEMCITABINE (jem SYE ta been) is a chemotherapy drug. This medicine is used to treat many types of cancer like breast cancer, lung cancer, pancreatic cancer, and ovarian cancer. This medicine may be used for other purposes; ask your health care provider or pharmacist if you have questions. COMMON BRAND NAME(S): Gemzar, Infugem What should I tell my health care provider before I take this medicine? They need to know if you have any of these conditions:  blood disorders  infection  kidney disease  liver disease  lung or breathing disease, like asthma  recent or ongoing radiation therapy  an unusual or allergic reaction to gemcitabine, other chemotherapy, other medicines, foods, dyes, or preservatives  pregnant or trying to get pregnant  breast-feeding How should I use this medicine? This drug is given as an infusion into a vein. It is administered in a hospital or clinic by a specially trained health care professional. Talk to your pediatrician regarding the use of this medicine in children. Special care may be needed. Overdosage: If you think you have taken too much of this medicine contact a poison control center or emergency room at once. NOTE: This medicine is only for you. Do not share this medicine with others. What if I miss a dose? It is important not to miss your dose. Call your doctor or health care professional if you are unable to keep an appointment. What may interact with this medicine?  medicines to increase blood counts like filgrastim, pegfilgrastim, sargramostim  some other chemotherapy drugs like cisplatin  vaccines Talk to your doctor or health care professional before taking any of these medicines:  acetaminophen  aspirin  ibuprofen  ketoprofen  naproxen This list may not describe all possible interactions. Give your health care provider a list of all the medicines, herbs, non-prescription drugs, or  dietary supplements you use. Also tell them if you smoke, drink alcohol, or use illegal drugs. Some items may interact with your medicine. What should I watch for while using this medicine? Visit your doctor for checks on your progress. This drug may make you feel generally unwell. This is not uncommon, as chemotherapy can affect healthy cells as well as cancer cells. Report any side effects. Continue your course of treatment even though you feel ill unless your doctor tells you to stop. In some cases, you may be given additional medicines to help with side effects. Follow all directions for their use. Call your doctor or health care professional for advice if you get a fever, chills or sore throat, or other symptoms of a cold or flu. Do not treat yourself. This drug decreases your body's ability to fight infections. Try to avoid being around people who are sick. This medicine may increase your risk to bruise or bleed. Call your doctor or health care professional if you notice any unusual bleeding. Be careful brushing and flossing your teeth or using a toothpick because you may get an infection or bleed more easily. If you have any dental work done, tell your dentist you are receiving this medicine. Avoid taking products that contain aspirin, acetaminophen, ibuprofen, naproxen, or ketoprofen unless instructed by your doctor. These medicines may hide a fever. Do not become pregnant while taking this medicine or for 6 months after stopping it. Women should inform their doctor if they wish to become pregnant or think they might be pregnant. Men should not father a child while taking this medicine and for 3 months after stopping it.  There is a potential for serious side effects to an unborn child. Talk to your health care professional or pharmacist for more information. Do not breast-feed an infant while taking this medicine or for at least 1 week after stopping it. Men should inform their doctors if they wish  to father a child. This medicine may lower sperm counts. Talk with your doctor or health care professional if you are concerned about your fertility. What side effects may I notice from receiving this medicine? Side effects that you should report to your doctor or health care professional as soon as possible:  allergic reactions like skin rash, itching or hives, swelling of the face, lips, or tongue  breathing problems  pain, redness, or irritation at site where injected  signs and symptoms of a dangerous change in heartbeat or heart rhythm like chest pain; dizziness; fast or irregular heartbeat; palpitations; feeling faint or lightheaded, falls; breathing problems  signs of decreased platelets or bleeding - bruising, pinpoint red spots on the skin, black, tarry stools, blood in the urine  signs of decreased red blood cells - unusually weak or tired, feeling faint or lightheaded, falls  signs of infection - fever or chills, cough, sore throat, pain or difficulty passing urine  signs and symptoms of kidney injury like trouble passing urine or change in the amount of urine  signs and symptoms of liver injury like dark yellow or brown urine; general ill feeling or flu-like symptoms; light-colored stools; loss of appetite; nausea; right upper belly pain; unusually weak or tired; yellowing of the eyes or skin  swelling of ankles, feet, hands Side effects that usually do not require medical attention (report to your doctor or health care professional if they continue or are bothersome):  constipation  diarrhea  hair loss  loss of appetite  nausea  rash  vomiting This list may not describe all possible side effects. Call your doctor for medical advice about side effects. You may report side effects to FDA at 1-800-FDA-1088. Where should I keep my medicine? This drug is given in a hospital or clinic and will not be stored at home. NOTE: This sheet is a summary. It may not cover all  possible information. If you have questions about this medicine, talk to your doctor, pharmacist, or health care provider.  2020 [employer]vier/Gold Standard (2017-08-08 18:06:11)

## 2019-03-01 LAB — CANCER ANTIGEN 19-9: CA 19-9: 13 U/mL (ref 0–35)

## 2019-03-03 ENCOUNTER — Telehealth: Payer: Self-pay | Admitting: Oncology

## 2019-03-03 NOTE — Telephone Encounter (Signed)
Called and spoke with patient. Confirmed appt  °

## 2019-03-14 ENCOUNTER — Inpatient Hospital Stay: Payer: Self-pay

## 2019-03-14 ENCOUNTER — Inpatient Hospital Stay (HOSPITAL_BASED_OUTPATIENT_CLINIC_OR_DEPARTMENT_OTHER): Payer: Self-pay | Admitting: Nurse Practitioner

## 2019-03-14 ENCOUNTER — Encounter: Payer: Self-pay | Admitting: Nurse Practitioner

## 2019-03-14 ENCOUNTER — Other Ambulatory Visit: Payer: Self-pay

## 2019-03-14 VITALS — BP 125/74 | HR 68 | Temp 98.5°F | Resp 18 | Ht 66.0 in | Wt 204.7 lb

## 2019-03-14 DIAGNOSIS — Z23 Encounter for immunization: Secondary | ICD-10-CM

## 2019-03-14 DIAGNOSIS — C252 Malignant neoplasm of tail of pancreas: Secondary | ICD-10-CM

## 2019-03-14 DIAGNOSIS — Z95828 Presence of other vascular implants and grafts: Secondary | ICD-10-CM

## 2019-03-14 LAB — CMP (CANCER CENTER ONLY)
ALT: 21 U/L (ref 0–44)
AST: 26 U/L (ref 15–41)
Albumin: 3.7 g/dL (ref 3.5–5.0)
Alkaline Phosphatase: 55 U/L (ref 38–126)
Anion gap: 11 (ref 5–15)
BUN: 13 mg/dL (ref 8–23)
CO2: 27 mmol/L (ref 22–32)
Calcium: 8.5 mg/dL — ABNORMAL LOW (ref 8.9–10.3)
Chloride: 101 mmol/L (ref 98–111)
Creatinine: 0.97 mg/dL (ref 0.61–1.24)
GFR, Est AFR Am: >60 mL/min (ref >60)
GFR, Estimated: >60 mL/min (ref >60)
Glucose, Bld: 149 mg/dL — ABNORMAL HIGH (ref 70–99)
Potassium: 3.3 mmol/L — ABNORMAL LOW (ref 3.5–5.1)
Sodium: 139 mmol/L (ref 135–145)
Total Bilirubin: 0.6 mg/dL (ref 0.3–1.2)
Total Protein: 6.6 g/dL (ref 6.5–8.1)

## 2019-03-14 LAB — CBC WITH DIFFERENTIAL (CANCER CENTER ONLY)
Abs Immature Granulocytes: 0.03 10*3/uL (ref 0.00–0.07)
Basophils Absolute: 0.1 10*3/uL (ref 0.0–0.1)
Basophils Relative: 1 %
Eosinophils Absolute: 0.5 10*3/uL (ref 0.0–0.5)
Eosinophils Relative: 6 %
HCT: 42.5 % (ref 39.0–52.0)
Hemoglobin: 13.5 g/dL (ref 13.0–17.0)
Immature Granulocytes: 0 %
Lymphocytes Relative: 18 %
Lymphs Abs: 1.5 10*3/uL (ref 0.7–4.0)
MCH: 28.1 pg (ref 26.0–34.0)
MCHC: 31.8 g/dL (ref 30.0–36.0)
MCV: 88.4 fL (ref 80.0–100.0)
Monocytes Absolute: 1.1 10*3/uL — ABNORMAL HIGH (ref 0.1–1.0)
Monocytes Relative: 13 %
Neutro Abs: 5.3 10*3/uL (ref 1.7–7.7)
Neutrophils Relative %: 62 %
Platelet Count: 145 10*3/uL — ABNORMAL LOW (ref 150–400)
RBC: 4.81 MIL/uL (ref 4.22–5.81)
RDW: 14.1 % (ref 11.5–15.5)
WBC Count: 8.6 10*3/uL (ref 4.0–10.5)
nRBC: 0 % (ref 0.0–0.2)

## 2019-03-14 MED ORDER — SODIUM CHLORIDE 0.9 % IV SOLN
Freq: Once | INTRAVENOUS | Status: AC
  Administered 2019-03-14: 12:00:00 via INTRAVENOUS
  Filled 2019-03-14: qty 250

## 2019-03-14 MED ORDER — SODIUM CHLORIDE 0.9 % IV SOLN
2000.0000 mg | Freq: Once | INTRAVENOUS | Status: AC
  Administered 2019-03-14: 2000 mg via INTRAVENOUS
  Filled 2019-03-14: qty 52.6

## 2019-03-14 MED ORDER — PROCHLORPERAZINE MALEATE 10 MG PO TABS
10.0000 mg | ORAL_TABLET | Freq: Once | ORAL | Status: AC
  Administered 2019-03-14: 10 mg via ORAL

## 2019-03-14 MED ORDER — PROCHLORPERAZINE MALEATE 10 MG PO TABS
ORAL_TABLET | ORAL | Status: AC
  Filled 2019-03-14: qty 1

## 2019-03-14 MED ORDER — HEPARIN SOD (PORK) LOCK FLUSH 100 UNIT/ML IV SOLN
500.0000 [IU] | Freq: Once | INTRAVENOUS | Status: AC | PRN
  Administered 2019-03-14: 500 [IU]
  Filled 2019-03-14: qty 5

## 2019-03-14 MED ORDER — SODIUM CHLORIDE 0.9% FLUSH
10.0000 mL | INTRAVENOUS | Status: DC | PRN
  Administered 2019-03-14: 10 mL
  Filled 2019-03-14: qty 10

## 2019-03-14 MED ORDER — SODIUM CHLORIDE 0.9% FLUSH
10.0000 mL | INTRAVENOUS | Status: DC | PRN
  Administered 2019-03-14: 14:00:00 10 mL
  Filled 2019-03-14: qty 10

## 2019-03-14 NOTE — Patient Instructions (Signed)
[facility] °Discharge Instructions for Patients Receiving Chemotherapy ° °Today you received the following chemotherapy agents Gemzar ° °To help prevent nausea and vomiting after your treatment, we encourage you to take your nausea medication as directed. °  °If you develop nausea and vomiting that is not controlled by your nausea medication, call the clinic.  ° °BELOW ARE SYMPTOMS THAT SHOULD BE REPORTED IMMEDIATELY: °· *FEVER GREATER THAN 100.5 F °· *CHILLS WITH OR WITHOUT FEVER °· NAUSEA AND VOMITING THAT IS NOT CONTROLLED WITH YOUR NAUSEA MEDICATION °· *UNUSUAL SHORTNESS OF BREATH °· *UNUSUAL BRUISING OR BLEEDING °· TENDERNESS IN MOUTH AND THROAT WITH OR WITHOUT PRESENCE OF ULCERS °· *URINARY PROBLEMS °· *BOWEL PROBLEMS °· UNUSUAL RASH °Items with * indicate a potential emergency and should be followed up as soon as possible. ° °Feel free to call the clinic should you have any questions or concerns. The clinic phone number is [phone]. ° °Please show the CHEMO ALERT CARD at check-in to the Emergency Department and triage nurse. ° ° °

## 2019-03-14 NOTE — Progress Notes (Signed)
[place] OFFICE PROGRESS NOTE   Diagnosis:   Pancreas cancer  INTERVAL HISTORY:   Alex [name] returns as scheduled.  He completed another cycle of chemotherapy on 02/28/2019.  He received gemcitabine alone.  Abraxane was held secondary to neuropathy.  He has persistent numbness in the fingertips and toes.  He has occasional balance difficulty.  No falls.  He denies nausea/vomiting.  No mouth sores.  No diarrhea.  No rash.  He denies fever, cough, shortness of breath.  Objective:  Vital signs in last 24 hours:  Blood pressure 125/74, pulse 68, temperature 98.5 F (36.9 C), temperature source Temporal, resp. rate 18, height '5\' 6"'  (1.676 m), weight 204 lb 11.2 oz (92.9 kg), SpO2 97 %.    HEENT: No thrush or ulcers. GI: Abdomen soft and nontender.  No hepatomegaly. Vascular: No leg edema. Neuro: Vibratory sense with mild to moderate decrease over the fingertips per tuning fork exam. Skin: No rash. Port-A-Cath without erythema.   Lab Results:  Lab Results  Component Value Date   WBC 8.6 03/14/2019   HGB 13.5 03/14/2019   HCT 42.5 03/14/2019   MCV 88.4 03/14/2019   PLT 145 (L) 03/14/2019   NEUTROABS PENDING 03/14/2019    Imaging:  No results found.  Medications: I have reviewed the patient's current medications.  Assessment/Plan: 1. 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 Well-differentiated adenocarcinoma, 0/13 lymph nodes positive, MSI-stable, no loss of mismatch repair protein expression ? Tumor involving the rectosigmoid junction ? Sigmoidoscopy 09/18/2017-distal sigmoid colon tumor beginning between 15 and 20 cm from the dentate line, completely obstructing ? CT abdomen/pelvis 09/14/2017-sigmoid thickening no evidence of metastatic disease ? 2 mm right upper lobe nodule on CT 08/04/2017 ? Colonoscopy 02/07/2018-polyps removed from the cecum, ascending colon, transverse  colon, descending colon, rectosigmoid colon and rectum (tubular adenomas)  2. Right lung pneumonia/empyema March 2019  Right VATS, drainage of empyema, and decortication of the right lower lobe 08/31/2017  3.CVA in 2010  4.Rectal and active sigmoid polyps noted on flexible sigmoidoscopy 09/18/2017  5.Pancreas tail mass/right liver lesions and 4 mm splenic lesion on CT abdomen/pelvis 05/27/2018  MRI abdomen 07/27/2018-right liver lesions, rim-enhancing lesion in the pancreas tail  Ultrasound-guided biopsy of a right liver lesion 08/14/2018-metastatic adenocarcinoma, cytokeratin 7+, CDX-2 weak positive, cytokeratin 20 negative, consistent with metastatic pancreas cancer; MSI stable; tumor mutational burden 0  CT abdomen/pelvis 09/26/2018-increased size of previously noted liver lesions, new right liver lesion, 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  Cycle 6 gemcitabine/Abraxane 12/20/2018  Cycle 7 gemcitabine/Abraxane 01/03/2019  Cycle 8 gemcitabine/Abraxane 01/17/2019  Cycle 9 gemcitabine/Abraxane 01/31/2019  Cycle 10 gemcitabine/Abraxane 02/13/2019  Cycle 11 gemcitabine/Abraxane 02/28/2019 (Abraxane held secondary to neuropathy symptoms)  Cycle 12 gemcitabine/Abraxane 03/14/2019 (Abraxane held due to persistent neuropathy symptoms)  6.Port-A-Cath placement by Dr. Connor4/24/2020    Disposition: Alex [name] appears stable.  He has completed 11 cycles of gemcitabine/Abraxane.  Abraxane was held with cycle 11 due to neuropathy symptoms.  There is no clinical evidence of disease progression.  He has  persistent neuropathy symptoms.  Plan to proceed with gemcitabine alone today, continue to hold Abraxane.  We reviewed the CBC  from today.  Counts adequate to proceed with treatment.  He will return for lab, follow-up, chemotherapy in 2 weeks.  He will contact the office in the interim with any problems.  Plan reviewed with Dr. Benay Spice.    Ned Card ANP/GNP-BC   03/14/2019  11:13 AM

## 2019-03-15 LAB — CANCER ANTIGEN 19-9: CA 19-9: 17 U/mL (ref 0–35)

## 2019-03-18 ENCOUNTER — Telehealth: Payer: Self-pay | Admitting: Nurse Practitioner

## 2019-03-18 NOTE — Telephone Encounter (Signed)
Scheduled per los. Called and left msg. Mailed printout  °

## 2019-03-21 ENCOUNTER — Other Ambulatory Visit: Payer: Self-pay | Admitting: Nurse Practitioner

## 2019-03-21 ENCOUNTER — Telehealth: Payer: Self-pay | Admitting: *Deleted

## 2019-03-21 DIAGNOSIS — C252 Malignant neoplasm of tail of pancreas: Secondary | ICD-10-CM

## 2019-03-21 MED ORDER — POTASSIUM CHLORIDE CRYS ER 20 MEQ PO TBCR: 20.0000 meq | EXTENDED_RELEASE_TABLET | Freq: Every day | ORAL | 1 refills | Status: DC

## 2019-03-21 NOTE — Telephone Encounter (Addendum)
Left VM for patient to return call to office re: labs. Also sent MyChart message. Patient called back and was given K+ result and need to start oral K+. He will p/u today.

## 2019-03-22 ENCOUNTER — Other Ambulatory Visit: Payer: Self-pay | Admitting: Oncology

## 2019-03-28 ENCOUNTER — Inpatient Hospital Stay: Payer: Self-pay

## 2019-03-28 ENCOUNTER — Other Ambulatory Visit: Payer: Self-pay

## 2019-03-28 ENCOUNTER — Inpatient Hospital Stay (HOSPITAL_BASED_OUTPATIENT_CLINIC_OR_DEPARTMENT_OTHER): Payer: Self-pay | Admitting: Oncology

## 2019-03-28 VITALS — BP 133/86 | HR 71 | Temp 98.7°F | Resp 18 | Ht 66.0 in | Wt 201.0 lb

## 2019-03-28 DIAGNOSIS — C252 Malignant neoplasm of tail of pancreas: Secondary | ICD-10-CM

## 2019-03-28 DIAGNOSIS — Z95828 Presence of other vascular implants and grafts: Secondary | ICD-10-CM

## 2019-03-28 LAB — CMP (CANCER CENTER ONLY)
ALT: 25 U/L (ref 0–44)
AST: 27 U/L (ref 15–41)
Albumin: 3.8 g/dL (ref 3.5–5.0)
Alkaline Phosphatase: 52 U/L (ref 38–126)
Anion gap: 12 (ref 5–15)
BUN: 15 mg/dL (ref 8–23)
CO2: 26 mmol/L (ref 22–32)
Calcium: 8.7 mg/dL — ABNORMAL LOW (ref 8.9–10.3)
Chloride: 102 mmol/L (ref 98–111)
Creatinine: 0.99 mg/dL (ref 0.61–1.24)
GFR, Est AFR Am: >60 mL/min (ref >60)
GFR, Estimated: >60 mL/min (ref >60)
Glucose, Bld: 148 mg/dL — ABNORMAL HIGH (ref 70–99)
Potassium: 3.4 mmol/L — ABNORMAL LOW (ref 3.5–5.1)
Sodium: 140 mmol/L (ref 135–145)
Total Bilirubin: 0.7 mg/dL (ref 0.3–1.2)
Total Protein: 6.6 g/dL (ref 6.5–8.1)

## 2019-03-28 LAB — CBC WITH DIFFERENTIAL (CANCER CENTER ONLY)
Abs Immature Granulocytes: 0.02 10*3/uL (ref 0.00–0.07)
Basophils Absolute: 0 10*3/uL (ref 0.0–0.1)
Basophils Relative: 1 %
Eosinophils Absolute: 0.3 10*3/uL (ref 0.0–0.5)
Eosinophils Relative: 4 %
HCT: 42.1 % (ref 39.0–52.0)
Hemoglobin: 13.5 g/dL (ref 13.0–17.0)
Immature Granulocytes: 0 %
Lymphocytes Relative: 19 %
Lymphs Abs: 1.4 10*3/uL (ref 0.7–4.0)
MCH: 28.2 pg (ref 26.0–34.0)
MCHC: 32.1 g/dL (ref 30.0–36.0)
MCV: 87.9 fL (ref 80.0–100.0)
Monocytes Absolute: 1.1 10*3/uL — ABNORMAL HIGH (ref 0.1–1.0)
Monocytes Relative: 15 %
Neutro Abs: 4.5 10*3/uL (ref 1.7–7.7)
Neutrophils Relative %: 61 %
Platelet Count: 182 10*3/uL (ref 150–400)
RBC: 4.79 MIL/uL (ref 4.22–5.81)
RDW: 14.6 % (ref 11.5–15.5)
WBC Count: 7.3 10*3/uL (ref 4.0–10.5)
nRBC: 0 % (ref 0.0–0.2)

## 2019-03-28 MED ORDER — HEPARIN SOD (PORK) LOCK FLUSH 100 UNIT/ML IV SOLN
500.0000 [IU] | Freq: Once | INTRAVENOUS | Status: AC | PRN
  Administered 2019-03-28: 500 [IU]
  Filled 2019-03-28: qty 5

## 2019-03-28 MED ORDER — SODIUM CHLORIDE 0.9 % IV SOLN
2000.0000 mg | Freq: Once | INTRAVENOUS | Status: AC
  Administered 2019-03-28: 2000 mg via INTRAVENOUS
  Filled 2019-03-28: qty 52.6

## 2019-03-28 MED ORDER — SODIUM CHLORIDE 0.9% FLUSH
10.0000 mL | INTRAVENOUS | Status: DC | PRN
  Administered 2019-03-28: 10 mL
  Filled 2019-03-28: qty 10

## 2019-03-28 MED ORDER — SODIUM CHLORIDE 0.9% FLUSH
10.0000 mL | INTRAVENOUS | Status: DC | PRN
  Administered 2019-03-28: 10:00:00 10 mL
  Filled 2019-03-28: qty 10

## 2019-03-28 MED ORDER — PROCHLORPERAZINE MALEATE 10 MG PO TABS
ORAL_TABLET | ORAL | Status: AC
  Filled 2019-03-28: qty 1

## 2019-03-28 MED ORDER — SODIUM CHLORIDE 0.9 % IV SOLN
Freq: Once | INTRAVENOUS | Status: AC
  Administered 2019-03-28: 12:00:00 via INTRAVENOUS
  Filled 2019-03-28: qty 250

## 2019-03-28 MED ORDER — PROCHLORPERAZINE MALEATE 10 MG PO TABS
10.0000 mg | ORAL_TABLET | Freq: Once | ORAL | Status: AC
  Administered 2019-03-28: 10 mg via ORAL

## 2019-03-28 NOTE — Patient Instructions (Signed)
[facility] Discharge Instructions for Patients Receiving Chemotherapy  Today you received the following chemotherapy agents: Gemcitabine (Gemzar)  To help prevent nausea and vomiting after your treatment, we encourage you to take your nausea medication as directed.    If you develop nausea and vomiting that is not controlled by your nausea medication, call the clinic.   BELOW ARE SYMPTOMS THAT SHOULD BE REPORTED IMMEDIATELY:  *FEVER GREATER THAN 100.5 F  *CHILLS WITH OR WITHOUT FEVER  NAUSEA AND VOMITING THAT IS NOT CONTROLLED WITH YOUR NAUSEA MEDICATION  *UNUSUAL SHORTNESS OF BREATH  *UNUSUAL BRUISING OR BLEEDING  TENDERNESS IN MOUTH AND THROAT WITH OR WITHOUT PRESENCE OF ULCERS  *URINARY PROBLEMS  *BOWEL PROBLEMS  UNUSUAL RASH Items with * indicate a potential emergency and should be followed up as soon as possible.  Feel free to call the clinic should you have any questions or concerns. The clinic phone number is [phone].  Please show the [place] at check-in to the Emergency Department and triage nurse.  Coronavirus (COVID-19) Are you at risk?  Are you at risk for the Coronavirus (COVID-19)?  To be considered HIGH RISK for Coronavirus (COVID-19), you have to meet the following criteria:  . Traveled to [place], [place], [place], [place] or [place]; or in the [place] to [place], [place], [place], or [place]; and have fever, cough, and shortness of breath within the last 2 weeks of travel OR . Been in close contact with a person diagnosed with COVID-19 within the last 2 weeks and have fever, cough, and shortness of breath . IF YOU DO NOT MEET THESE CRITERIA, YOU ARE CONSIDERED LOW RISK FOR COVID-19.  What to do if you are HIGH RISK for COVID-19?  Marland Kitchen If you are having a medical emergency, call 911. . Seek medical care right away. Before you go to a doctor's office, urgent care or emergency department, call ahead and tell them  about your recent travel, contact with someone diagnosed with COVID-19, and your symptoms. You should receive instructions from your physician's office regarding next steps of care.  . When you arrive at healthcare provider, tell the healthcare staff immediately you have returned from visiting [place], [place], [place], [place] or [place]; or traveled in the [place] to [place], [place], [place], or [place]; in the last two weeks or you have been in close contact with a person diagnosed with COVID-19 in the last 2 weeks.   . Tell the health care staff about your symptoms: fever, cough and shortness of breath. . After you have been seen by a medical provider, you will be either: o Tested for (COVID-19) and discharged home on quarantine except to seek medical care if symptoms worsen, and asked to  - Stay home and avoid contact with others until you get your results (4-5 days)  - Avoid travel on public transportation if possible (such as bus, train, or airplane) or o Sent to the Emergency Department by EMS for evaluation, COVID-19 testing, and possible admission depending on your condition and test results.  What to do if you are LOW RISK for COVID-19?  Reduce your risk of any infection by using the same precautions used for avoiding the common cold or flu:  Marland Kitchen Wash your hands often with soap and warm water for at least 20 seconds.  If soap and water are not readily available, use an alcohol-based hand sanitizer with at least 60% alcohol.  . If coughing  or sneezing, cover your mouth and nose by coughing or sneezing into the elbow areas of your shirt or coat, into a tissue or into your sleeve (not your hands). . Avoid shaking hands with others and consider head nods or verbal greetings only. . Avoid touching your eyes, nose, or mouth with unwashed hands.  . Avoid close contact with people who are sick. . Avoid places or events with large numbers of people in one location, like concerts or  sporting events. . Carefully consider travel plans you have or are making. . If you are planning any travel outside or inside the [place], visit the CDC's Travelers' Health webpage for the latest health notices. . If you have some symptoms but not all symptoms, continue to monitor at home and seek medical attention if your symptoms worsen. . If you are having a medical emergency, call 911.   [place] / e-Visit: [email or web address]         [facility]: [facility]: [phone]                   [facility]: [phone]

## 2019-03-28 NOTE — Progress Notes (Signed)
[facility] OFFICE PROGRESS NOTE   Diagnosis: Pancreas cancer  INTERVAL HISTORY:   Alex [name] completed another treatment with gemcitabine on 03/14/2019.  No fever or rash.  He feels well.  Good appetite.  He continues to have mild numbness in the fingers and feet.  He has some difficulty with balance.  Objective:  Vital signs in last 24 hours:  Blood pressure 133/86, pulse 71, temperature 98.7 F (37.1 C), temperature source Temporal, resp. rate 18, height '5\' 6"'  (1.676 m), weight 201 lb (91.2 kg), SpO2 98 %.  GI: No hepatomegaly, no mass, nontender Vascular: No leg edema    Portacath/PICC-without erythema  Lab Results:  Lab Results  Component Value Date   WBC 7.3 03/28/2019   HGB 13.5 03/28/2019   HCT 42.1 03/28/2019   MCV 87.9 03/28/2019   PLT 182 03/28/2019   NEUTROABS 4.5 03/28/2019    CMP  Lab Results  Component Value Date   NA 140 03/28/2019   K 3.4 (L) 03/28/2019   CL 102 03/28/2019   CO2 26 03/28/2019   GLUCOSE 148 (H) 03/28/2019   BUN 15 03/28/2019   CREATININE 0.99 03/28/2019   CALCIUM 8.7 (L) 03/28/2019   PROT 6.6 03/28/2019   ALBUMIN 3.8 03/28/2019   AST 27 03/28/2019   ALT 25 03/28/2019   ALKPHOS 52 03/28/2019   BILITOT 0.7 03/28/2019   GFRNONAA >60 03/28/2019   GFRAA >60 03/28/2019     Medications: I have reviewed the patient's current medications.   Assessment/Plan: 1. 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 Well-differentiated adenocarcinoma, 0/13 lymph nodes positive, MSI-stable, no loss of mismatch repair protein expression ? Tumor involving the rectosigmoid junction ? Sigmoidoscopy 09/18/2017-distal sigmoid colon tumor beginning between 15 and 20 cm from the dentate line, completely obstructing ? CT abdomen/pelvis 09/14/2017-sigmoid thickening no evidence of metastatic disease ? 2 mm right upper lobe nodule on CT 08/04/2017 ? Colonoscopy  02/07/2018-polyps removed from the cecum, ascending colon, transverse colon, descending colon, rectosigmoid colon and rectum (tubular adenomas)  2. Right lung pneumonia/empyema March 2019  Right VATS, drainage of empyema, and decortication of the right lower lobe 08/31/2017  3.CVA in 2010  4.Rectal and active sigmoid polyps noted on flexible sigmoidoscopy 09/18/2017  5.Pancreas tail mass/right liver lesions and 4 mm splenic lesion on CT abdomen/pelvis 05/27/2018  MRI abdomen 07/27/2018-right liver lesions, rim-enhancing lesion in the pancreas tail  Ultrasound-guided biopsy of a right liver lesion 08/14/2018-metastatic adenocarcinoma, cytokeratin 7+, CDX-2 weak positive, cytokeratin 20 negative, consistent with metastatic pancreas cancer; MSI stable; tumor mutational burden 0  CT abdomen/pelvis 09/26/2018-increased size of previously noted liver lesions, new right liver lesion, 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  Cycle 6 gemcitabine/Abraxane 12/20/2018  Cycle 7 gemcitabine/Abraxane 01/03/2019  Cycle 8 gemcitabine/Abraxane 01/17/2019  Cycle 9 gemcitabine/Abraxane 01/31/2019  Cycle 10 gemcitabine/Abraxane 02/13/2019  Cycle 11 gemcitabine/Abraxane 02/28/2019 (Abraxane held secondary to neuropathy symptoms)  Cycle 12 gemcitabine/Abraxane 03/14/2019 (Abraxane held due to persistent neuropathy symptoms)   Cycle 13 gemcitabine/Abraxane 03/28/2019 (Abraxane held due to persistent neuropathy symptoms)  6.Port-A-Cath placement by Dr. Connor4/24/2020     Disposition: Mr. Deese appears stable.  He  continues to have  neuropathy symptoms.  Abraxane will remain on hold.  He will complete another treatment with gemcitabine today.  He will return for an office visit and chemotherapy in 2 weeks.  He will be scheduled for a restaging CT evaluation after the next cycle of chemotherapy.  Betsy Coder, MD  03/28/2019  10:35 AM

## 2019-03-29 LAB — CANCER ANTIGEN 19-9: CA 19-9: 13 U/mL (ref 0–35)

## 2019-03-31 ENCOUNTER — Telehealth: Payer: Self-pay | Admitting: Oncology

## 2019-03-31 NOTE — Telephone Encounter (Signed)
Scheduled  Per los. Called and spoke with patient. Confirmed appt

## 2019-04-06 ENCOUNTER — Other Ambulatory Visit: Payer: Self-pay | Admitting: Oncology

## 2019-04-09 ENCOUNTER — Encounter: Payer: Self-pay | Admitting: Pharmacy Technician

## 2019-04-11 ENCOUNTER — Other Ambulatory Visit: Payer: Self-pay

## 2019-04-11 ENCOUNTER — Inpatient Hospital Stay: Payer: Medicare Other

## 2019-04-11 ENCOUNTER — Inpatient Hospital Stay: Payer: Medicare Other | Attending: Nurse Practitioner | Admitting: Oncology

## 2019-04-11 VITALS — BP 123/73 | HR 64 | Temp 98.7°F | Resp 17 | Ht 66.0 in | Wt 201.2 lb

## 2019-04-11 DIAGNOSIS — Z5111 Encounter for antineoplastic chemotherapy: Secondary | ICD-10-CM | POA: Insufficient documentation

## 2019-04-11 DIAGNOSIS — C252 Malignant neoplasm of tail of pancreas: Secondary | ICD-10-CM

## 2019-04-11 DIAGNOSIS — Z95828 Presence of other vascular implants and grafts: Secondary | ICD-10-CM

## 2019-04-11 LAB — CBC WITH DIFFERENTIAL (CANCER CENTER ONLY)
Abs Immature Granulocytes: 0.02 10*3/uL (ref 0.00–0.07)
Basophils Absolute: 0.1 10*3/uL (ref 0.0–0.1)
Basophils Relative: 1 %
Eosinophils Absolute: 0.3 10*3/uL (ref 0.0–0.5)
Eosinophils Relative: 4 %
HCT: 43.3 % (ref 39.0–52.0)
Hemoglobin: 13.9 g/dL (ref 13.0–17.0)
Immature Granulocytes: 0 %
Lymphocytes Relative: 19 %
Lymphs Abs: 1.2 10*3/uL (ref 0.7–4.0)
MCH: 28.5 pg (ref 26.0–34.0)
MCHC: 32.1 g/dL (ref 30.0–36.0)
MCV: 88.9 fL (ref 80.0–100.0)
Monocytes Absolute: 1 10*3/uL (ref 0.1–1.0)
Monocytes Relative: 15 %
Neutro Abs: 4.1 10*3/uL (ref 1.7–7.7)
Neutrophils Relative %: 61 %
Platelet Count: 148 10*3/uL — ABNORMAL LOW (ref 150–400)
RBC: 4.87 MIL/uL (ref 4.22–5.81)
RDW: 14.8 % (ref 11.5–15.5)
WBC Count: 6.6 10*3/uL (ref 4.0–10.5)
nRBC: 0 % (ref 0.0–0.2)

## 2019-04-11 LAB — CMP (CANCER CENTER ONLY)
ALT: 26 U/L (ref 0–44)
AST: 28 U/L (ref 15–41)
Albumin: 3.9 g/dL (ref 3.5–5.0)
Alkaline Phosphatase: 50 U/L (ref 38–126)
Anion gap: 10 (ref 5–15)
BUN: 16 mg/dL (ref 8–23)
CO2: 28 mmol/L (ref 22–32)
Calcium: 8.9 mg/dL (ref 8.9–10.3)
Chloride: 104 mmol/L (ref 98–111)
Creatinine: 0.87 mg/dL (ref 0.61–1.24)
GFR, Est AFR Am: >60 mL/min (ref >60)
GFR, Estimated: >60 mL/min (ref >60)
Glucose, Bld: 155 mg/dL — ABNORMAL HIGH (ref 70–99)
Potassium: 3.7 mmol/L (ref 3.5–5.1)
Sodium: 142 mmol/L (ref 135–145)
Total Bilirubin: 0.6 mg/dL (ref 0.3–1.2)
Total Protein: 6.9 g/dL (ref 6.5–8.1)

## 2019-04-11 MED ORDER — PROCHLORPERAZINE MALEATE 10 MG PO TABS
ORAL_TABLET | ORAL | Status: AC
  Filled 2019-04-11: qty 1

## 2019-04-11 MED ORDER — SODIUM CHLORIDE 0.9% FLUSH
10.0000 mL | INTRAVENOUS | Status: DC | PRN
  Administered 2019-04-11: 10 mL
  Filled 2019-04-11: qty 10

## 2019-04-11 MED ORDER — SODIUM CHLORIDE 0.9 % IV SOLN
2000.0000 mg | Freq: Once | INTRAVENOUS | Status: AC
  Administered 2019-04-11: 2000 mg via INTRAVENOUS
  Filled 2019-04-11: qty 52.6

## 2019-04-11 MED ORDER — HEPARIN SOD (PORK) LOCK FLUSH 100 UNIT/ML IV SOLN
500.0000 [IU] | Freq: Once | INTRAVENOUS | Status: AC | PRN
  Administered 2019-04-11: 500 [IU]
  Filled 2019-04-11: qty 5

## 2019-04-11 MED ORDER — PROCHLORPERAZINE MALEATE 10 MG PO TABS
10.0000 mg | ORAL_TABLET | Freq: Once | ORAL | Status: AC
  Administered 2019-04-11: 10 mg via ORAL

## 2019-04-11 MED ORDER — SODIUM CHLORIDE 0.9 % IV SOLN
Freq: Once | INTRAVENOUS | Status: AC
  Administered 2019-04-11: 12:00:00 via INTRAVENOUS
  Filled 2019-04-11: qty 250

## 2019-04-11 NOTE — Progress Notes (Signed)
[facility] OFFICE PROGRESS NOTE   Diagnosis: Pancreas cancer  INTERVAL HISTORY:   Alex [name] completed another cycle of gemcitabine on 03/28/2019.  No fever or rash.  No pain.  He reports stable numbness in the extremities.  This does not interfere with activity.  Objective:  Vital signs in last 24 hours:  Blood pressure 123/73, pulse 64, temperature 98.7 F (37.1 C), temperature source Temporal, resp. rate 17, height 5' 6" (1.676 m), weight 201 lb 3.2 oz (91.3 kg), SpO2 99 %.   Limited physical examination secondary to distancing with the Covid pandemic GI: No hepatomegaly, no mass, nontender Vascular: No leg edema   Portacath/PICC-without erythema  Lab Results:  Lab Results  Component Value Date   WBC 6.6 04/11/2019   HGB 13.9 04/11/2019   HCT 43.3 04/11/2019   MCV 88.9 04/11/2019   PLT 148 (L) 04/11/2019   NEUTROABS 4.1 04/11/2019    CMP  Lab Results  Component Value Date   NA 142 04/11/2019   K 3.7 04/11/2019   CL 104 04/11/2019   CO2 28 04/11/2019   GLUCOSE 155 (H) 04/11/2019   BUN 16 04/11/2019   CREATININE 0.87 04/11/2019   CALCIUM 8.9 04/11/2019   PROT 6.9 04/11/2019   ALBUMIN 3.9 04/11/2019   AST 28 04/11/2019   ALT 26 04/11/2019   ALKPHOS 50 04/11/2019   BILITOT 0.6 04/11/2019   GFRNONAA >60 04/11/2019   GFRAA >60 04/11/2019    Lab Results  Component Value Date   CEA1 1.19 08/19/2018    Medications: I have reviewed the patient's current medications.   Assessment/Plan: 1. 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 Well-differentiated adenocarcinoma, 0/13 lymph nodes positive, MSI-stable, no loss of mismatch repair protein expression ? Tumor involving the rectosigmoid junction ? Sigmoidoscopy 09/18/2017-distal sigmoid colon tumor beginning between 15 and 20 cm from the dentate line, completely obstructing ? CT abdomen/pelvis 09/14/2017-sigmoid  thickening no evidence of metastatic disease ? 2 mm right upper lobe nodule on CT 08/04/2017 ? Colonoscopy 02/07/2018-polyps removed from the cecum, ascending colon, transverse colon, descending colon, rectosigmoid colon and rectum (tubular adenomas)  2. Right lung pneumonia/empyema March 2019  Right VATS, drainage of empyema, and decortication of the right lower lobe 08/31/2017  3.CVA in 2010  4.Rectal and active sigmoid polyps noted on flexible sigmoidoscopy 09/18/2017  5.Pancreas tail mass/right liver lesions and 4 mm splenic lesion on CT abdomen/pelvis 05/27/2018  MRI abdomen 07/27/2018-right liver lesions, rim-enhancing lesion in the pancreas tail  Ultrasound-guided biopsy of a right liver lesion 08/14/2018-metastatic adenocarcinoma, cytokeratin 7+, CDX-2 weak positive, cytokeratin 20 negative, consistent with metastatic pancreas cancer; MSI stable; tumor mutational burden 0  CT abdomen/pelvis 09/26/2018-increased size of previously noted liver lesions, new right liver lesion, 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  Cycle 6 gemcitabine/Abraxane 12/20/2018  Cycle 7 gemcitabine/Abraxane 01/03/2019  Cycle 8 gemcitabine/Abraxane 01/17/2019  Cycle 9 gemcitabine/Abraxane 01/31/2019  Cycle 10 gemcitabine/Abraxane 02/13/2019  Cycle 11 gemcitabine/Abraxane 02/28/2019 (Abraxane held secondary to neuropathy symptoms)  Cycle 12 gemcitabine/Abraxane 03/14/2019 (Abraxane held due to persistent neuropathy symptoms)   Cycle 13 gemcitabine/Abraxane 03/28/2019 (Abraxane held due to persistent neuropathy symptoms)   Cycle 14 gemcitabine/Abraxane  04/11/2019 (Abraxane held due to persistent neuropathy symptoms)  6.Port-A-Cath placement by Dr. Connor4/24/2020   Disposition: Alex [name] appears stable.  He has tolerated the chemotherapy well.  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  Abraxane will remain on hold.  He will complete another treatment with gemcitabine today.  He will return for an office visit and chemotherapy on 04/29/2019.  He will undergo a restaging CT evaluation on 04/28/2019.  Betsy Coder, MD  04/11/2019  10:52 AM

## 2019-04-11 NOTE — Patient Instructions (Signed)

## 2019-04-12 LAB — CANCER ANTIGEN 19-9: CA 19-9: 16 U/mL (ref 0–35)

## 2019-04-27 ENCOUNTER — Other Ambulatory Visit: Payer: Self-pay | Admitting: Oncology

## 2019-04-28 ENCOUNTER — Other Ambulatory Visit: Payer: Self-pay

## 2019-04-28 ENCOUNTER — Ambulatory Visit (HOSPITAL_COMMUNITY)
Admission: RE | Admit: 2019-04-28 | Discharge: 2019-04-28 | Disposition: A | Discharge status: Home / Self Care | Payer: Self-pay | Source: Ambulatory Visit | Attending: Oncology | Admitting: Oncology

## 2019-04-28 DIAGNOSIS — C252 Malignant neoplasm of tail of pancreas: Secondary | ICD-10-CM | POA: Insufficient documentation

## 2019-04-28 MED ORDER — HEPARIN SOD (PORK) LOCK FLUSH 100 UNIT/ML IV SOLN
INTRAVENOUS | Status: AC
  Administered 2019-04-28: 500 [IU]
  Filled 2019-04-28: qty 5

## 2019-04-28 MED ORDER — SODIUM CHLORIDE (PF) 0.9 % IJ SOLN
INTRAMUSCULAR | Status: AC
  Filled 2019-04-28: qty 50

## 2019-04-28 MED ORDER — IOHEXOL 300 MG/ML  SOLN
100.0000 mL | Freq: Once | INTRAMUSCULAR | Status: AC | PRN
  Administered 2019-04-28: 100 mL via INTRAVENOUS

## 2019-04-28 MED ORDER — HEPARIN SOD (PORK) LOCK FLUSH 100 UNIT/ML IV SOLN: 500.0000 [IU] | Freq: Once | INTRAVENOUS | Status: DC

## 2019-04-29 ENCOUNTER — Inpatient Hospital Stay: Payer: Self-pay

## 2019-04-29 ENCOUNTER — Encounter: Payer: Self-pay | Admitting: Nurse Practitioner

## 2019-04-29 ENCOUNTER — Other Ambulatory Visit: Payer: Self-pay

## 2019-04-29 ENCOUNTER — Inpatient Hospital Stay: Payer: Self-pay | Attending: Nurse Practitioner | Admitting: Nurse Practitioner

## 2019-04-29 VITALS — BP 127/78 | HR 65 | Temp 97.8°F | Resp 17 | Ht 66.0 in | Wt 201.2 lb

## 2019-04-29 DIAGNOSIS — Z5111 Encounter for antineoplastic chemotherapy: Secondary | ICD-10-CM | POA: Insufficient documentation

## 2019-04-29 DIAGNOSIS — C252 Malignant neoplasm of tail of pancreas: Secondary | ICD-10-CM

## 2019-04-29 DIAGNOSIS — C7889 Secondary malignant neoplasm of other digestive organs: Secondary | ICD-10-CM | POA: Insufficient documentation

## 2019-04-29 DIAGNOSIS — C19 Malignant neoplasm of rectosigmoid junction: Secondary | ICD-10-CM | POA: Insufficient documentation

## 2019-04-29 DIAGNOSIS — C787 Secondary malignant neoplasm of liver and intrahepatic bile duct: Secondary | ICD-10-CM | POA: Insufficient documentation

## 2019-04-29 DIAGNOSIS — Z95828 Presence of other vascular implants and grafts: Secondary | ICD-10-CM

## 2019-04-29 LAB — CMP (CANCER CENTER ONLY)
ALT: 24 U/L (ref 0–44)
AST: 28 U/L (ref 15–41)
Albumin: 4.1 g/dL (ref 3.5–5.0)
Alkaline Phosphatase: 51 U/L (ref 38–126)
Anion gap: 9 (ref 5–15)
BUN: 14 mg/dL (ref 8–23)
CO2: 28 mmol/L (ref 22–32)
Calcium: 9.1 mg/dL (ref 8.9–10.3)
Chloride: 103 mmol/L (ref 98–111)
Creatinine: 0.89 mg/dL (ref 0.61–1.24)
GFR, Est AFR Am: >60 mL/min (ref >60)
GFR, Estimated: >60 mL/min (ref >60)
Glucose, Bld: 122 mg/dL — ABNORMAL HIGH (ref 70–99)
Potassium: 3.8 mmol/L (ref 3.5–5.1)
Sodium: 140 mmol/L (ref 135–145)
Total Bilirubin: 0.7 mg/dL (ref 0.3–1.2)
Total Protein: 7.2 g/dL (ref 6.5–8.1)

## 2019-04-29 LAB — CBC WITH DIFFERENTIAL (CANCER CENTER ONLY)
Abs Immature Granulocytes: 0.02 10*3/uL (ref 0.00–0.07)
Basophils Absolute: 0.1 10*3/uL (ref 0.0–0.1)
Basophils Relative: 1 %
Eosinophils Absolute: 0.3 10*3/uL (ref 0.0–0.5)
Eosinophils Relative: 4 %
HCT: 46.7 % (ref 39.0–52.0)
Hemoglobin: 14.7 g/dL (ref 13.0–17.0)
Immature Granulocytes: 0 %
Lymphocytes Relative: 17 %
Lymphs Abs: 1.1 10*3/uL (ref 0.7–4.0)
MCH: 28.5 pg (ref 26.0–34.0)
MCHC: 31.5 g/dL (ref 30.0–36.0)
MCV: 90.5 fL (ref 80.0–100.0)
Monocytes Absolute: 0.9 10*3/uL (ref 0.1–1.0)
Monocytes Relative: 13 %
Neutro Abs: 4.3 10*3/uL (ref 1.7–7.7)
Neutrophils Relative %: 65 %
Platelet Count: 229 10*3/uL (ref 150–400)
RBC: 5.16 MIL/uL (ref 4.22–5.81)
RDW: 15.5 % (ref 11.5–15.5)
WBC Count: 6.5 10*3/uL (ref 4.0–10.5)
nRBC: 0 % (ref 0.0–0.2)

## 2019-04-29 MED ORDER — SODIUM CHLORIDE 0.9% FLUSH
10.0000 mL | INTRAVENOUS | Status: DC | PRN
  Administered 2019-04-29: 10 mL
  Filled 2019-04-29: qty 10

## 2019-04-29 MED ORDER — PROCHLORPERAZINE MALEATE 10 MG PO TABS
10.0000 mg | ORAL_TABLET | Freq: Once | ORAL | Status: AC
  Administered 2019-04-29: 10 mg via ORAL

## 2019-04-29 MED ORDER — SODIUM CHLORIDE 0.9 % IV SOLN
2000.0000 mg | Freq: Once | INTRAVENOUS | Status: AC
  Administered 2019-04-29: 2000 mg via INTRAVENOUS
  Filled 2019-04-29: qty 52.6

## 2019-04-29 MED ORDER — PROCHLORPERAZINE MALEATE 10 MG PO TABS
ORAL_TABLET | ORAL | Status: AC
  Filled 2019-04-29: qty 1

## 2019-04-29 MED ORDER — SODIUM CHLORIDE 0.9 % IV SOLN
Freq: Once | INTRAVENOUS | Status: AC
  Administered 2019-04-29: 15:00:00 via INTRAVENOUS
  Filled 2019-04-29: qty 250

## 2019-04-29 MED ORDER — HEPARIN SOD (PORK) LOCK FLUSH 100 UNIT/ML IV SOLN
500.0000 [IU] | Freq: Once | INTRAVENOUS | Status: AC | PRN
  Administered 2019-04-29: 500 [IU]
  Filled 2019-04-29: qty 5

## 2019-04-29 NOTE — Patient Instructions (Signed)
[facility] Discharge Instructions for Patients Receiving Chemotherapy  Today you received the following chemotherapy agents: Gemcitabine (Gemzar)  To help prevent nausea and vomiting after your treatment, we encourage you to take your nausea medication as directed.    If you develop nausea and vomiting that is not controlled by your nausea medication, call the clinic.   BELOW ARE SYMPTOMS THAT SHOULD BE REPORTED IMMEDIATELY:  *FEVER GREATER THAN 100.5 F  *CHILLS WITH OR WITHOUT FEVER  NAUSEA AND VOMITING THAT IS NOT CONTROLLED WITH YOUR NAUSEA MEDICATION  *UNUSUAL SHORTNESS OF BREATH  *UNUSUAL BRUISING OR BLEEDING  TENDERNESS IN MOUTH AND THROAT WITH OR WITHOUT PRESENCE OF ULCERS  *URINARY PROBLEMS  *BOWEL PROBLEMS  UNUSUAL RASH Items with * indicate a potential emergency and should be followed up as soon as possible.  Feel free to call the clinic should you have any questions or concerns. The clinic phone number is [phone].  Please show the [facility] at check-in to the Emergency Department and triage nurse.  Coronavirus (COVID-19) Are you at risk?  Are you at risk for the Coronavirus (COVID-19)?  To be considered HIGH RISK for Coronavirus (COVID-19), you have to meet the following criteria:  . Traveled to [place], [place], [place], [place] or [place]; or in the [place] to [place], [place], [place], or [place]; and have fever, cough, and shortness of breath within the last 2 weeks of travel OR . Been in close contact with a person diagnosed with COVID-19 within the last 2 weeks and have fever, cough, and shortness of breath . IF YOU DO NOT MEET THESE CRITERIA, YOU ARE CONSIDERED LOW RISK FOR COVID-19.  What to do if you are HIGH RISK for COVID-19?  Marland Kitchen If you are having a medical emergency, call 911. . Seek medical care right away. Before you go to a doctor's office, urgent care or emergency department, call ahead and tell them  about your recent travel, contact with someone diagnosed with COVID-19, and your symptoms. You should receive instructions from your physician's office regarding next steps of care.  . When you arrive at healthcare provider, tell the healthcare staff immediately you have returned from visiting [place], [place], [place], [place] or [place]; or traveled in the [place] to [place], [place], [place], or [place]; in the last two weeks or you have been in close contact with a person diagnosed with COVID-19 in the last 2 weeks.   . Tell the health care staff about your symptoms: fever, cough and shortness of breath. . After you have been seen by a medical provider, you will be either: o Tested for (COVID-19) and discharged home on quarantine except to seek medical care if symptoms worsen, and asked to  - Stay home and avoid contact with others until you get your results (4-5 days)  - Avoid travel on public transportation if possible (such as bus, train, or airplane) or o Sent to the Emergency Department by EMS for evaluation, COVID-19 testing, and possible admission depending on your condition and test results.  What to do if you are LOW RISK for COVID-19?  Reduce your risk of any infection by using the same precautions used for avoiding the common cold or flu:  Marland Kitchen Wash your hands often with soap and warm water for at least 20 seconds.  If soap and water are not readily available, use an alcohol-based hand sanitizer with at least 60% alcohol.  . If coughing  or sneezing, cover your mouth and nose by coughing or sneezing into the elbow areas of your shirt or coat, into a tissue or into your sleeve (not your hands). . Avoid shaking hands with others and consider head nods or verbal greetings only. . Avoid touching your eyes, nose, or mouth with unwashed hands.  . Avoid close contact with people who are sick. . Avoid places or events with large numbers of people in one location, like concerts or  sporting events. . Carefully consider travel plans you have or are making. . If you are planning any travel outside or inside the [place], visit the CDC's Travelers' Health webpage for the latest health notices. . If you have some symptoms but not all symptoms, continue to monitor at home and seek medical attention if your symptoms worsen. . If you are having a medical emergency, call 911.   [place] / e-Visit: [email or web address]         [facility]: [facility]: [phone]                   [facility]: [phone]

## 2019-04-29 NOTE — Progress Notes (Addendum)
[place] OFFICE PROGRESS NOTE   Diagnosis: Pancreas cancer  INTERVAL HISTORY:   Alex [name] returns as scheduled.  He completed a cycle of gemcitabine 04/11/2019.  He denies nausea/vomiting.  No mouth sores.  No diarrhea.  No fever.  No rash.  He denies pain.  He has a good appetite.  He notes increased neuropathy symptoms in the hands and feet.  Objective:  Vital signs in last 24 hours:  Blood pressure 127/78, pulse 65, temperature 97.8 F (36.6 C), temperature source Temporal, resp. rate 17, height '5\' 6"'  (1.676 m), weight 201 lb 3.2 oz (91.3 kg), SpO2 97 %.    HEENT: No thrush or ulcers. GI: Abdomen soft and nontender.  No hepatomegaly.  No mass. Vascular: No leg edema. Neuro: Vibratory sense moderately decreased over the fingertips per tuning fork exam. Skin: No rash. Port-A-Cath without erythema.   Lab Results:  Lab Results  Component Value Date   WBC 6.5 04/29/2019   HGB 14.7 04/29/2019   HCT 46.7 04/29/2019   MCV 90.5 04/29/2019   PLT 229 04/29/2019   NEUTROABS 4.3 04/29/2019    Imaging:  Ct Abdomen Pelvis W Contrast  Result Date: 04/28/2019 CLINICAL DATA:  History of colon cancer with metastatic disease to pancreas and liver. EXAM: CT ABDOMEN AND PELVIS WITH CONTRAST TECHNIQUE: Multidetector CT imaging of the abdomen and pelvis was performed using the standard protocol following bolus administration of intravenous contrast. CONTRAST:  167m OMNIPAQUE IOHEXOL 300 MG/ML  SOLN COMPARISON:  12/18/2018. FINDINGS: Lower chest: Signs of basilar atelectasis and scarring in the right lung base with similar appearance to prior exam. Stable tiny nodule in the left lower lobe approximately 3 mm. No pericardial effusion. Heart is incompletely imaged. Signs of coronary artery disease. Small perifissural nodule seen in the left chest (image 1, series 8) appears stable dating back to 08/26/2017 where it appears to represent a perifissural lymph node.  Hepatobiliary: Signs of background hepatic steatosis. No sign of new hepatic lesion with diminished size of known hepatic metastatic foci. Along the right hepatic margin (image 32, series 3) 7 mm lesion previously 11 mm. (Image 33, series 3) 11 mm lesion also along the right hepatic margin is unchanged in terms of greatest axial dimension but short axis measurement now at 7 as compared to 11 mm. Pancreas: Pancreatic mass with diminished size compared to prior exam. (Image 53, series 6) 3 x 1.6 cm mass previously 3.6 x 2.2 cm. No new pancreatic findings. Mass continues to encase splenorenal ligament structures with short gastric collaterals due to splenic venous occlusion. No new pancreatic mass. Spleen: Normal in size without focal abnormality. Splenic vein occlusion as before with perigastric and gastroepiploic collaterals. Adrenals/Urinary Tract: The adrenal glands are normal. Kidneys enhance symmetrically without focal renal lesion or signs of hydronephrosis. Stomach/Bowel: Normal appendix. No signs of bowel obstruction. Left lower quadrant colostomy. No acute bowel process. Vascular/Lymphatic: Moderate calcific and noncalcific atherosclerosis. No signs of aneurysm the abdominal aorta. Pelvic vessels are patent. No signs of retroperitoneal, upper abdominal or of pelvic lymphadenopathy. Reproductive: Prostate is unremarkable by CT. Other: Small fat containing right inguinal hernia. Postoperative changes in the midline and abdominal wall changes of left lower quadrant colostomy with small amount of peristomal herniation of fat. Mild engorgement of pericolonic vessels secondary to splenic vein occlusion and short gastric collaterals. Musculoskeletal: No signs of acute bone finding or evidence of destructive bone process. IMPRESSION: 1. Interval decrease in size of pancreatic mass. 2. Interval decrease in size  of hepatic metastatic foci. 3. 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 4. Signs of coronary artery disease. 5. Aortic atherosclerosis. Aortic Atherosclerosis (ICD10-I70.0). Electronically Signed   By: Zetta Bills M.D.   On: 04/28/2019 12:26    Medications: I have reviewed the patient's current medications.  Assessment/Plan: 1. 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 Well-differentiated adenocarcinoma, 0/13 lymph nodes positive, MSI-stable, no loss of mismatch repair protein expression ? Tumor involving the rectosigmoid junction ? Sigmoidoscopy 09/18/2017-distal sigmoid colon tumor beginning between 15 and 20 cm from the dentate line, completely obstructing ? CT abdomen/pelvis 09/14/2017-sigmoid thickening no evidence of metastatic disease ? 2 mm right upper lobe nodule on CT 08/04/2017 ? Colonoscopy 02/07/2018-polyps removed from the cecum, ascending colon, transverse colon, descending colon, rectosigmoid colon and rectum (tubular adenomas)  2. Right lung pneumonia/empyema March 2019  Right VATS, drainage of empyema, and decortication of the right lower lobe 08/31/2017  3.CVA in 2010  4.Rectal and active sigmoid polyps noted on flexible sigmoidoscopy 09/18/2017  5.Pancreas tail mass/right liver lesions and 4 mm splenic lesion on CT abdomen/pelvis 05/27/2018  MRI abdomen 07/27/2018-right liver lesions, rim-enhancing lesion in the pancreas tail  Ultrasound-guided biopsy of a right liver lesion 08/14/2018-metastatic adenocarcinoma, cytokeratin 7+, CDX-2 weak positive, cytokeratin 20 negative, consistent with metastatic pancreas cancer; MSI stable; tumor mutational burden 0  CT abdomen/pelvis 09/26/2018-increased size of previously noted liver lesions, new right liver lesion, increased size of pancreas mass with occlusion of the splenic vein  Cycle 1 gemcitabine/Abraxane 10/11/2018  Cycle 2 gemcitabine/Abraxane 10/25/2018  Cycle 3  gemcitabine/Abraxane 11/08/2018  Cycle 4 gemcitabine/Abraxane 11/22/2018  Cycle 5 gemcitabine/Abraxane 12/06/2018  CT 12/18/2018-overall improved with reduced size of the hepatic metastatic lesions, mildly reduced size of the pancreatic tail mass.  Cycle 6 gemcitabine/Abraxane 12/20/2018  Cycle 7 gemcitabine/Abraxane 01/03/2019  Cycle 8 gemcitabine/Abraxane 01/17/2019  Cycle 9 gemcitabine/Abraxane 01/31/2019  Cycle 10 gemcitabine/Abraxane 02/13/2019  Cycle 11 gemcitabine/Abraxane 02/28/2019 (Abraxane held secondary to neuropathy symptoms)  Cycle 12 gemcitabine/Abraxane 03/14/2019 (Abraxane held due to persistent neuropathy symptoms)   Cycle 13 gemcitabine/Abraxane 03/28/2019 (Abraxane held due to persistent neuropathy symptoms)  Cycle 14 gemcitabine/Abraxane 04/11/2019 (Abraxane held due to persistent neuropathy symptoms)  CT abdomen/pelvis 04/28/2019-decrease in size of pancreatic mass.  Decrease in size of hepatic metastatic foci.    Cycle 15 gemcitabine (Abraxane held due to neuropathy symptoms)  6.Port-A-Cath placement by Dr. Connor4/24/2020   Disposition: Alex [name] appears stable.  He has completed 14 cycles of systemic therapy.  Restaging CTs show further improvement in the pancreas mass and liver lesions.  We reviewed the results/images with Alex [name] and his sister at today's visit.  Dr. Gearldine Shown recommendation is to continue gemcitabine on a 2-week schedule.  Abraxane will continue to be held due to neuropathy symptoms.  Alex [name] is in agreement.  We reviewed the CBC from today.  Counts adequate to proceed with treatment.  He will return for lab, follow-up, chemotherapy on 05/16/2019.  He will contact the office in the interim with any problems.  Patient seen with Dr. Benay Spice.  25 minutes were spent face-to-face at today's visit with the majority of that time involved in counseling/coordination of care.    Ned Card ANP/GNP-BC   04/29/2019  1:53 PM This  was a shared visit with Ned Card.  Alex [name] appears stable.  He continues to have neuropathy symptoms.  The restaging CTs confirmed continued improvement of the pancreas mass disease.  We  reviewed the CT images with Alex [name] and his sister.  I recommend continuing gemcitabine. Julieanne Manson, MD

## 2019-04-30 ENCOUNTER — Telehealth: Payer: Self-pay | Admitting: Oncology

## 2019-04-30 LAB — CANCER ANTIGEN 19-9: CA 19-9: 17 U/mL (ref 0–35)

## 2019-04-30 NOTE — Telephone Encounter (Signed)
Scheduled per los. Called and spoke with patient. Confirmed appt 

## 2019-05-16 ENCOUNTER — Inpatient Hospital Stay: Payer: Self-pay

## 2019-05-16 ENCOUNTER — Other Ambulatory Visit: Payer: Self-pay

## 2019-05-16 ENCOUNTER — Encounter: Payer: Self-pay | Admitting: Nurse Practitioner

## 2019-05-16 ENCOUNTER — Inpatient Hospital Stay (HOSPITAL_BASED_OUTPATIENT_CLINIC_OR_DEPARTMENT_OTHER): Payer: Self-pay | Admitting: Nurse Practitioner

## 2019-05-16 VITALS — BP 144/78 | HR 67 | Temp 98.5°F | Resp 17 | Ht 66.0 in | Wt 202.1 lb

## 2019-05-16 DIAGNOSIS — C252 Malignant neoplasm of tail of pancreas: Secondary | ICD-10-CM

## 2019-05-16 DIAGNOSIS — Z95828 Presence of other vascular implants and grafts: Secondary | ICD-10-CM

## 2019-05-16 LAB — CBC WITH DIFFERENTIAL (CANCER CENTER ONLY)
Abs Immature Granulocytes: 0.03 10*3/uL (ref 0.00–0.07)
Basophils Absolute: 0.1 10*3/uL (ref 0.0–0.1)
Basophils Relative: 1 %
Eosinophils Absolute: 0.3 10*3/uL (ref 0.0–0.5)
Eosinophils Relative: 4 %
HCT: 44.2 % (ref 39.0–52.0)
Hemoglobin: 14.2 g/dL (ref 13.0–17.0)
Immature Granulocytes: 0 %
Lymphocytes Relative: 17 %
Lymphs Abs: 1.3 10*3/uL (ref 0.7–4.0)
MCH: 28.9 pg (ref 26.0–34.0)
MCHC: 32.1 g/dL (ref 30.0–36.0)
MCV: 89.8 fL (ref 80.0–100.0)
Monocytes Absolute: 0.9 10*3/uL (ref 0.1–1.0)
Monocytes Relative: 12 %
Neutro Abs: 5.1 10*3/uL (ref 1.7–7.7)
Neutrophils Relative %: 66 %
Platelet Count: 187 10*3/uL (ref 150–400)
RBC: 4.92 MIL/uL (ref 4.22–5.81)
RDW: 15.2 % (ref 11.5–15.5)
WBC Count: 7.7 10*3/uL (ref 4.0–10.5)
nRBC: 0 % (ref 0.0–0.2)

## 2019-05-16 LAB — CMP (CANCER CENTER ONLY)
ALT: 19 U/L (ref 0–44)
AST: 24 U/L (ref 15–41)
Albumin: 3.9 g/dL (ref 3.5–5.0)
Alkaline Phosphatase: 47 U/L (ref 38–126)
Anion gap: 9 (ref 5–15)
BUN: 19 mg/dL (ref 8–23)
CO2: 28 mmol/L (ref 22–32)
Calcium: 8.8 mg/dL — ABNORMAL LOW (ref 8.9–10.3)
Chloride: 103 mmol/L (ref 98–111)
Creatinine: 0.96 mg/dL (ref 0.61–1.24)
GFR, Est AFR Am: >60 mL/min (ref >60)
GFR, Estimated: >60 mL/min (ref >60)
Glucose, Bld: 111 mg/dL — ABNORMAL HIGH (ref 70–99)
Potassium: 3.5 mmol/L (ref 3.5–5.1)
Sodium: 140 mmol/L (ref 135–145)
Total Bilirubin: 0.6 mg/dL (ref 0.3–1.2)
Total Protein: 7 g/dL (ref 6.5–8.1)

## 2019-05-16 MED ORDER — SODIUM CHLORIDE 0.9% FLUSH
10.0000 mL | INTRAVENOUS | Status: DC | PRN
  Administered 2019-05-16: 10 mL
  Filled 2019-05-16: qty 10

## 2019-05-16 MED ORDER — SODIUM CHLORIDE 0.9 % IV SOLN
2000.0000 mg | Freq: Once | INTRAVENOUS | Status: AC
  Administered 2019-05-16: 2000 mg via INTRAVENOUS
  Filled 2019-05-16: qty 52.6

## 2019-05-16 MED ORDER — SODIUM CHLORIDE 0.9 % IV SOLN
Freq: Once | INTRAVENOUS | Status: AC
  Filled 2019-05-16: qty 250

## 2019-05-16 MED ORDER — PROCHLORPERAZINE MALEATE 10 MG PO TABS
10.0000 mg | ORAL_TABLET | Freq: Once | ORAL | Status: AC
  Administered 2019-05-16: 10 mg via ORAL

## 2019-05-16 MED ORDER — HEPARIN SOD (PORK) LOCK FLUSH 100 UNIT/ML IV SOLN
500.0000 [IU] | Freq: Once | INTRAVENOUS | Status: AC | PRN
  Administered 2019-05-16: 500 [IU]
  Filled 2019-05-16: qty 5

## 2019-05-16 MED ORDER — PROCHLORPERAZINE MALEATE 10 MG PO TABS
ORAL_TABLET | ORAL | Status: AC
  Filled 2019-05-16: qty 1

## 2019-05-16 NOTE — Progress Notes (Signed)
[place] OFFICE PROGRESS NOTE   Diagnosis: Pancreas cancer  INTERVAL HISTORY:   Alex [name] returns as scheduled. He completed a cycle of gemcitabine 04/29/2019.  He denies nausea/vomiting.  No diarrhea.  No fever or rash following treatment.  He denies shortness of breath and cough.  No abdominal pain.  He has a good appetite.  He notes "soreness" dorsum of the left foot for a few weeks.  He initially noted the discomfort after wearing "boots".  No leg swelling or calf pain.  No rash or redness in this area.  Objective:  Vital signs in last 24 hours:  Blood pressure (!) 144/78, pulse 67, temperature 98.5 F (36.9 C), temperature source Temporal, resp. rate 17, height '5\' 6"'  (1.676 m), weight 202 lb 1.6 oz (91.7 kg), SpO2 98 %.    HEENT: No thrush or ulcers. GI: Abdomen soft and nontender.  No hepatomegaly. Vascular: No leg edema.  Several veins at the dorsum of the left foot are softly distended.  No calf tenderness. Musculoskeletal: Slight fullness/edema dorsum left foot. Skin: Soles are dry appearing. Port-A-Cath without erythema.   Lab Results:  Lab Results  Component Value Date   WBC 7.7 05/16/2019   HGB 14.2 05/16/2019   HCT 44.2 05/16/2019   MCV 89.8 05/16/2019   PLT 187 05/16/2019   NEUTROABS 5.1 05/16/2019    Imaging:  No results found.  Medications: I have reviewed the patient's current medications.  Assessment/Plan: 1. 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 Well-differentiated adenocarcinoma, 0/13 lymph nodes positive, MSI-stable, no loss of mismatch repair protein expression ? Tumor involving the rectosigmoid junction ? Sigmoidoscopy 09/18/2017-distal sigmoid colon tumor beginning between 15 and 20 cm from the dentate line, completely obstructing ? CT abdomen/pelvis 09/14/2017-sigmoid thickening no evidence of metastatic disease ? 2 mm right upper lobe  nodule on CT 08/04/2017 ? Colonoscopy 02/07/2018-polyps removed from the cecum, ascending colon, transverse colon, descending colon, rectosigmoid colon and rectum (tubular adenomas)  2. Right lung pneumonia/empyema March 2019  Right VATS, drainage of empyema, and decortication of the right lower lobe 08/31/2017  3.CVA in 2010  4.Rectal and active sigmoid polyps noted on flexible sigmoidoscopy 09/18/2017  5.Pancreas tail mass/right liver lesions and 4 mm splenic lesion on CT abdomen/pelvis 05/27/2018  MRI abdomen 07/27/2018-right liver lesions, rim-enhancing lesion in the pancreas tail  Ultrasound-guided biopsy of a right liver lesion 08/14/2018-metastatic adenocarcinoma, cytokeratin 7+, CDX-2 weak positive, cytokeratin 20 negative, consistent with metastatic pancreas cancer; MSI stable; tumor mutational burden 0  CT abdomen/pelvis 09/26/2018-increased size of previously noted liver lesions, new right liver lesion, 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  Cycle 6 gemcitabine/Abraxane 12/20/2018  Cycle 7 gemcitabine/Abraxane 01/03/2019  Cycle 8 gemcitabine/Abraxane 01/17/2019  Cycle 9 gemcitabine/Abraxane 01/31/2019  Cycle 10 gemcitabine/Abraxane 02/13/2019  Cycle 11 gemcitabine/Abraxane 02/28/2019 (Abraxane held secondary to neuropathy symptoms)  Cycle 12 gemcitabine/Abraxane 03/14/2019 (Abraxane held due to persistent neuropathy symptoms)  Cycle 13 gemcitabine/Abraxane 03/28/2019 (Abraxane held due to persistent neuropathy symptoms)  Cycle 14 gemcitabine/Abraxane 04/11/2019 (Abraxane held due to persistent  neuropathy symptoms)  CT abdomen/pelvis 04/28/2019-decrease in size of pancreatic mass.  Decrease in size of hepatic metastatic foci.  Cycle 15 gemcitabine 04/29/2019 (Abraxane held due to neuropathy symptoms)  Cycle 16 gemcitabine 05/16/2019 (Abraxane held due to neuropathy symptoms)  6.Port-A-Cath placement by Dr. Connor4/24/2020   Disposition: Alex [name] appears stable.  He has completed 15 cycles of systemic therapy, currently receiving gemcitabine alone.  Abraxane on hold due to neuropathy symptoms.  He is tolerating treatment well.  There is no clinical evidence of disease progression.  Plan to proceed with gemcitabine today as scheduled.  He has recent discomfort at the dorsum of the left foot.  Likely benign musculoskeletal injury.  He is at high risk for DVT.  He will contact the office with worsening pain and/or leg swelling.  He will take aspirin 325 mg daily for the next week and apply warm compresses.  He will return for lab, follow-up, gemcitabine on 05/29/2019.  He will contact the office in the interim with any problems.  Patient seen with Dr. Benay Spice.  25 minutes were spent face-to-face at today's visit with the majority of that time involved in counseling/coordination of care.    Ned Card ANP/GNP-BC   05/16/2019  12:09 PM Alex [name] was interviewed and examined.  The left foot discomfort is most likely secondary to benign musculoskeletal injury.  We have a low clinical suspicion for venous thrombosis.  He will continue treatment with gemcitabine.  Julieanne Manson, MD

## 2019-05-17 LAB — CANCER ANTIGEN 19-9: CA 19-9: 18 U/mL (ref 0–35)

## 2019-05-20 ENCOUNTER — Telehealth: Payer: Self-pay | Admitting: Oncology

## 2019-05-20 NOTE — Telephone Encounter (Signed)
Scheduled per los. Called and spoke with patient. Confirmed appts on 12/31 and 1/2

## 2019-05-25 ENCOUNTER — Other Ambulatory Visit: Payer: Self-pay | Admitting: Oncology

## 2019-05-29 ENCOUNTER — Inpatient Hospital Stay: Payer: Self-pay

## 2019-05-29 ENCOUNTER — Other Ambulatory Visit: Payer: Self-pay

## 2019-05-29 ENCOUNTER — Inpatient Hospital Stay (HOSPITAL_BASED_OUTPATIENT_CLINIC_OR_DEPARTMENT_OTHER): Payer: Self-pay | Admitting: Nurse Practitioner

## 2019-05-29 ENCOUNTER — Other Ambulatory Visit: Payer: Self-pay | Admitting: *Deleted

## 2019-05-29 ENCOUNTER — Encounter: Payer: Self-pay | Admitting: Nurse Practitioner

## 2019-05-29 VITALS — BP 135/87 | HR 63 | Temp 99.1°F | Resp 18 | Ht 66.0 in | Wt 210.4 lb

## 2019-05-29 DIAGNOSIS — C252 Malignant neoplasm of tail of pancreas: Secondary | ICD-10-CM

## 2019-05-29 DIAGNOSIS — Z95828 Presence of other vascular implants and grafts: Secondary | ICD-10-CM

## 2019-05-29 LAB — CBC WITH DIFFERENTIAL (CANCER CENTER ONLY)
Abs Immature Granulocytes: 0.03 10*3/uL (ref 0.00–0.07)
Basophils Absolute: 0 10*3/uL (ref 0.0–0.1)
Basophils Relative: 1 %
Eosinophils Absolute: 0.4 10*3/uL (ref 0.0–0.5)
Eosinophils Relative: 6 %
HCT: 44.2 % (ref 39.0–52.0)
Hemoglobin: 14 g/dL (ref 13.0–17.0)
Immature Granulocytes: 1 %
Lymphocytes Relative: 24 %
Lymphs Abs: 1.5 10*3/uL (ref 0.7–4.0)
MCH: 28.7 pg (ref 26.0–34.0)
MCHC: 31.7 g/dL (ref 30.0–36.0)
MCV: 90.6 fL (ref 80.0–100.0)
Monocytes Absolute: 1 10*3/uL (ref 0.1–1.0)
Monocytes Relative: 15 %
Neutro Abs: 3.5 10*3/uL (ref 1.7–7.7)
Neutrophils Relative %: 53 %
Platelet Count: 125 10*3/uL — ABNORMAL LOW (ref 150–400)
RBC: 4.88 MIL/uL (ref 4.22–5.81)
RDW: 15.6 % — ABNORMAL HIGH (ref 11.5–15.5)
WBC Count: 6.5 10*3/uL (ref 4.0–10.5)
nRBC: 0 % (ref 0.0–0.2)

## 2019-05-29 LAB — CMP (CANCER CENTER ONLY)
ALT: 24 U/L (ref 0–44)
AST: 27 U/L (ref 15–41)
Albumin: 3.8 g/dL (ref 3.5–5.0)
Alkaline Phosphatase: 47 U/L (ref 38–126)
Anion gap: 9 (ref 5–15)
BUN: 15 mg/dL (ref 8–23)
CO2: 27 mmol/L (ref 22–32)
Calcium: 8.7 mg/dL — ABNORMAL LOW (ref 8.9–10.3)
Chloride: 103 mmol/L (ref 98–111)
Creatinine: 0.93 mg/dL (ref 0.61–1.24)
GFR, Est AFR Am: >60 mL/min (ref >60)
GFR, Estimated: >60 mL/min (ref >60)
Glucose, Bld: 99 mg/dL (ref 70–99)
Potassium: 3.9 mmol/L (ref 3.5–5.1)
Sodium: 139 mmol/L (ref 135–145)
Total Bilirubin: 0.5 mg/dL (ref 0.3–1.2)
Total Protein: 6.8 g/dL (ref 6.5–8.1)

## 2019-05-29 MED ORDER — HEPARIN SOD (PORK) LOCK FLUSH 100 UNIT/ML IV SOLN
500.0000 [IU] | Freq: Once | INTRAVENOUS | Status: AC | PRN
  Administered 2019-05-29: 500 [IU]
  Filled 2019-05-29: qty 5

## 2019-05-29 MED ORDER — SODIUM CHLORIDE 0.9% FLUSH
10.0000 mL | INTRAVENOUS | Status: DC | PRN
  Administered 2019-05-29: 10 mL
  Filled 2019-05-29: qty 10

## 2019-05-29 NOTE — Progress Notes (Signed)
[place] OFFICE PROGRESS NOTE   Diagnosis: Pancreas cancer  INTERVAL HISTORY:   Mr. Cassarino returns as scheduled.  He completed another cycle of gemcitabine 05/16/2019.  He feels well.  No nausea or vomiting.  No mouth sores.  No diarrhea.  No fever or rash following treatment.  No cough or shortness of breath.  Neuropathy symptoms are stable overall, may be slightly improved in the toes.  Left foot pain is better.  Objective:  Vital signs in last 24 hours:  Blood pressure 135/87, pulse 63, temperature 99.1 F (37.3 C), temperature source Temporal, resp. rate 18, height '5\' 6"'  (1.676 m), weight 210 lb 6.4 oz (95.4 kg), SpO2 96 %.    HEENT: No thrush or ulcers. GI: Abdomen soft and nontender.  No hepatomegaly.  Left lower quadrant colostomy. Vascular: No leg edema. Neuro: Alert and oriented. Skin: No rash. Port-A-Cath without erythema.   Lab Results:  Lab Results  Component Value Date   WBC 6.5 05/29/2019   HGB 14.0 05/29/2019   HCT 44.2 05/29/2019   MCV 90.6 05/29/2019   PLT 125 (L) 05/29/2019   NEUTROABS 3.5 05/29/2019    Imaging:  No results found.  Medications: I have reviewed the patient's current medications.  Assessment/Plan: 1. 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 Well-differentiated adenocarcinoma, 0/13 lymph nodes positive, MSI-stable, no loss of mismatch repair protein expression ? Tumor involving the rectosigmoid junction ? Sigmoidoscopy 09/18/2017-distal sigmoid colon tumor beginning between 15 and 20 cm from the dentate line, completely obstructing ? CT abdomen/pelvis 09/14/2017-sigmoid thickening no evidence of metastatic disease ? 2 mm right upper lobe nodule on CT 08/04/2017 ? Colonoscopy 02/07/2018-polyps removed from the cecum, ascending colon, transverse colon, descending colon, rectosigmoid colon and rectum (tubular adenomas)  2. Right lung  pneumonia/empyema March 2019  Right VATS, drainage of empyema, and decortication of the right lower lobe 08/31/2017  3.CVA in 2010  4.Rectal and active sigmoid polyps noted on flexible sigmoidoscopy 09/18/2017  5.Pancreas tail mass/right liver lesions and 4 mm splenic lesion on CT abdomen/pelvis 05/27/2018  MRI abdomen 07/27/2018-right liver lesions, rim-enhancing lesion in the pancreas tail  Ultrasound-guided biopsy of a right liver lesion 08/14/2018-metastatic adenocarcinoma, cytokeratin 7+, CDX-2 weak positive, cytokeratin 20 negative, consistent with metastatic pancreas cancer; MSI stable; tumor mutational burden 0  CT abdomen/pelvis 09/26/2018-increased size of previously noted liver lesions, new right liver lesion, 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  Cycle 6 gemcitabine/Abraxane 12/20/2018  Cycle 7 gemcitabine/Abraxane 01/03/2019  Cycle 8 gemcitabine/Abraxane 01/17/2019  Cycle 9 gemcitabine/Abraxane 01/31/2019  Cycle 10 gemcitabine/Abraxane 02/13/2019  Cycle 11 gemcitabine/Abraxane 02/28/2019 (Abraxane held secondary to neuropathy symptoms)  Cycle 12 gemcitabine/Abraxane 03/14/2019 (Abraxane held due to persistent neuropathy symptoms)  Cycle 13 gemcitabine/Abraxane 03/28/2019 (Abraxane held due to persistent neuropathy symptoms)  Cycle 14 gemcitabine/Abraxane 04/11/2019 (Abraxane held due to persistent neuropathy symptoms)  CT abdomen/pelvis 04/28/2019-decrease in size of pancreatic mass. Decrease in size of hepatic metastatic foci.   Cycle 15 gemcitabine 04/29/2019 (Abraxane held due to  neuropathy symptoms)  Cycle 16 gemcitabine 05/16/2019 (Abraxane held due to neuropathy symptoms)  Cycle 17 gemcitabine 05/31/2019 (Abraxane on hold due to neuropathy symptoms)  6.Port-A-Cath placement  by Dr. Connor4/24/2020    Disposition: Mr. Uemura appears stable.  He has completed 16 cycles of systemic therapy.  He is currently receiving gemcitabine alone and tolerating this well.  Plan to proceed with cycle 17 as scheduled on 05/31/2019.  We reviewed the CBC from today.  Counts adequate to proceed with treatment as above.  He will return for lab, follow-up, gemcitabine on 06/13/2019.  He will contact the office in the interim with any problems.    Ned Card ANP/GNP-BC   05/29/2019  10:51 AM

## 2019-05-31 ENCOUNTER — Other Ambulatory Visit: Payer: Self-pay

## 2019-05-31 ENCOUNTER — Inpatient Hospital Stay: Payer: Self-pay | Attending: Nurse Practitioner

## 2019-05-31 VITALS — BP 129/77 | HR 66 | Temp 98.1°F | Resp 16

## 2019-05-31 DIAGNOSIS — C252 Malignant neoplasm of tail of pancreas: Secondary | ICD-10-CM | POA: Insufficient documentation

## 2019-05-31 DIAGNOSIS — Z5111 Encounter for antineoplastic chemotherapy: Secondary | ICD-10-CM | POA: Insufficient documentation

## 2019-05-31 DIAGNOSIS — C787 Secondary malignant neoplasm of liver and intrahepatic bile duct: Secondary | ICD-10-CM | POA: Insufficient documentation

## 2019-05-31 MED ORDER — SODIUM CHLORIDE 0.9% FLUSH
10.0000 mL | INTRAVENOUS | Status: DC | PRN
  Administered 2019-05-31: 10 mL
  Filled 2019-05-31: qty 10

## 2019-05-31 MED ORDER — HEPARIN SOD (PORK) LOCK FLUSH 100 UNIT/ML IV SOLN
500.0000 [IU] | Freq: Once | INTRAVENOUS | Status: AC | PRN
  Administered 2019-05-31: 500 [IU]
  Filled 2019-05-31: qty 5

## 2019-05-31 MED ORDER — SODIUM CHLORIDE 0.9 % IV SOLN
Freq: Once | INTRAVENOUS | Status: AC
  Filled 2019-05-31: qty 250

## 2019-05-31 MED ORDER — PROCHLORPERAZINE MALEATE 10 MG PO TABS
10.0000 mg | ORAL_TABLET | Freq: Once | ORAL | Status: AC
  Administered 2019-05-31: 10 mg via ORAL

## 2019-05-31 MED ORDER — SODIUM CHLORIDE 0.9 % IV SOLN
2000.0000 mg | Freq: Once | INTRAVENOUS | Status: AC
  Administered 2019-05-31: 2000 mg via INTRAVENOUS
  Filled 2019-05-31: qty 52.6

## 2019-05-31 MED ORDER — PROCHLORPERAZINE MALEATE 10 MG PO TABS
ORAL_TABLET | ORAL | Status: AC
  Filled 2019-05-31: qty 1

## 2019-05-31 NOTE — Patient Instructions (Signed)
[facility] °Discharge Instructions for Patients Receiving Chemotherapy ° °Today you received the following chemotherapy agents Gemzar ° °To help prevent nausea and vomiting after your treatment, we encourage you to take your nausea medication as directed. °  °If you develop nausea and vomiting that is not controlled by your nausea medication, call the clinic.  ° °BELOW ARE SYMPTOMS THAT SHOULD BE REPORTED IMMEDIATELY: °· *FEVER GREATER THAN 100.5 F °· *CHILLS WITH OR WITHOUT FEVER °· NAUSEA AND VOMITING THAT IS NOT CONTROLLED WITH YOUR NAUSEA MEDICATION °· *UNUSUAL SHORTNESS OF BREATH °· *UNUSUAL BRUISING OR BLEEDING °· TENDERNESS IN MOUTH AND THROAT WITH OR WITHOUT PRESENCE OF ULCERS °· *URINARY PROBLEMS °· *BOWEL PROBLEMS °· UNUSUAL RASH °Items with * indicate a potential emergency and should be followed up as soon as possible. ° °Feel free to call the clinic should you have any questions or concerns. The clinic phone number is [phone]. ° °Please show the CHEMO ALERT CARD at check-in to the Emergency Department and triage nurse. ° ° °

## 2019-06-13 ENCOUNTER — Encounter: Payer: Self-pay | Admitting: Nurse Practitioner

## 2019-06-13 ENCOUNTER — Inpatient Hospital Stay (HOSPITAL_BASED_OUTPATIENT_CLINIC_OR_DEPARTMENT_OTHER): Payer: Self-pay | Admitting: Nurse Practitioner

## 2019-06-13 ENCOUNTER — Inpatient Hospital Stay: Payer: Self-pay

## 2019-06-13 ENCOUNTER — Other Ambulatory Visit: Payer: Self-pay

## 2019-06-13 VITALS — BP 141/83 | HR 66 | Temp 98.7°F | Resp 17 | Ht 66.0 in | Wt 212.8 lb

## 2019-06-13 DIAGNOSIS — C252 Malignant neoplasm of tail of pancreas: Secondary | ICD-10-CM

## 2019-06-13 DIAGNOSIS — Z95828 Presence of other vascular implants and grafts: Secondary | ICD-10-CM

## 2019-06-13 LAB — CMP (CANCER CENTER ONLY)
ALT: 28 U/L (ref 0–44)
AST: 36 U/L (ref 15–41)
Albumin: 3.6 g/dL (ref 3.5–5.0)
Alkaline Phosphatase: 52 U/L (ref 38–126)
Anion gap: 9 (ref 5–15)
BUN: 13 mg/dL (ref 8–23)
CO2: 27 mmol/L (ref 22–32)
Calcium: 8.4 mg/dL — ABNORMAL LOW (ref 8.9–10.3)
Chloride: 104 mmol/L (ref 98–111)
Creatinine: 0.91 mg/dL (ref 0.61–1.24)
GFR, Est AFR Am: >60 mL/min
GFR, Estimated: >60 mL/min
Glucose, Bld: 149 mg/dL — ABNORMAL HIGH (ref 70–99)
Potassium: 4 mmol/L (ref 3.5–5.1)
Sodium: 140 mmol/L (ref 135–145)
Total Bilirubin: 0.4 mg/dL (ref 0.3–1.2)
Total Protein: 6.8 g/dL (ref 6.5–8.1)

## 2019-06-13 LAB — CBC WITH DIFFERENTIAL (CANCER CENTER ONLY)
Abs Immature Granulocytes: 0.05 K/uL (ref 0.00–0.07)
Basophils Absolute: 0 K/uL (ref 0.0–0.1)
Basophils Relative: 1 %
Eosinophils Absolute: 0.3 K/uL (ref 0.0–0.5)
Eosinophils Relative: 5 %
HCT: 44.1 % (ref 39.0–52.0)
Hemoglobin: 14 g/dL (ref 13.0–17.0)
Immature Granulocytes: 1 %
Lymphocytes Relative: 23 %
Lymphs Abs: 1.4 K/uL (ref 0.7–4.0)
MCH: 28.9 pg (ref 26.0–34.0)
MCHC: 31.7 g/dL (ref 30.0–36.0)
MCV: 91.1 fL (ref 80.0–100.0)
Monocytes Absolute: 0.8 K/uL (ref 0.1–1.0)
Monocytes Relative: 12 %
Neutro Abs: 3.7 K/uL (ref 1.7–7.7)
Neutrophils Relative %: 58 %
Platelet Count: 142 K/uL — ABNORMAL LOW (ref 150–400)
RBC: 4.84 MIL/uL (ref 4.22–5.81)
RDW: 15.5 % (ref 11.5–15.5)
WBC Count: 6.4 K/uL (ref 4.0–10.5)
nRBC: 0 % (ref 0.0–0.2)

## 2019-06-13 LAB — MAGNESIUM: Magnesium: 2.1 mg/dL (ref 1.7–2.4)

## 2019-06-13 MED ORDER — SODIUM CHLORIDE 0.9% FLUSH
10.0000 mL | INTRAVENOUS | Status: DC | PRN
  Administered 2019-06-13: 12:00:00 10 mL
  Filled 2019-06-13: qty 10

## 2019-06-13 MED ORDER — HEPARIN SOD (PORK) LOCK FLUSH 100 UNIT/ML IV SOLN
500.0000 [IU] | Freq: Once | INTRAVENOUS | Status: AC | PRN
  Administered 2019-06-13: 12:00:00 500 [IU]
  Filled 2019-06-13: qty 5

## 2019-06-13 MED ORDER — PROCHLORPERAZINE MALEATE 10 MG PO TABS
10.0000 mg | ORAL_TABLET | Freq: Once | ORAL | Status: AC
  Administered 2019-06-13: 10:00:00 10 mg via ORAL

## 2019-06-13 MED ORDER — PROCHLORPERAZINE MALEATE 10 MG PO TABS
ORAL_TABLET | ORAL | Status: AC
  Filled 2019-06-13: qty 1

## 2019-06-13 MED ORDER — SODIUM CHLORIDE 0.9% FLUSH
10.0000 mL | INTRAVENOUS | Status: DC | PRN
  Administered 2019-06-13: 09:00:00 10 mL
  Filled 2019-06-13: qty 10

## 2019-06-13 MED ORDER — SODIUM CHLORIDE 0.9 % IV SOLN
2000.0000 mg | Freq: Once | INTRAVENOUS | Status: AC
  Administered 2019-06-13: 2000 mg via INTRAVENOUS
  Filled 2019-06-13: qty 52.6

## 2019-06-13 MED ORDER — SODIUM CHLORIDE 0.9 % IV SOLN
Freq: Once | INTRAVENOUS | Status: AC
  Filled 2019-06-13: qty 250

## 2019-06-13 NOTE — Progress Notes (Signed)
[place] OFFICE PROGRESS NOTE   Diagnosis: Pancreas cancer  INTERVAL HISTORY:   Alex [name] returns as scheduled.  He completed another cycle of gemcitabine 05/31/2019.  He denies nausea/vomiting.  No mouth sores.  No diarrhea.  No fever, cough, shortness of breath.  No rash.  He has periodic leg cramps.  Neuropathy symptoms unchanged.  Objective:  Vital signs in last 24 hours:  Blood pressure (!) 141/83, pulse 66, temperature 98.7 F (37.1 C), temperature source Temporal, resp. rate 17, height '5\' 6"'  (1.676 m), weight 212 lb 12.8 oz (96.5 kg), SpO2 98 %.    HEENT: No thrush or ulcers. Resp: Breath sounds diminished right posterior lung base.  Faint rales right lung base.  No respiratory distress. Cardio: Regular rate and rhythm. GI: Abdomen soft and nontender.  No hepatomegaly.  Left lower quadrant colostomy. Vascular: No leg edema.  Calves soft and nontender.  Skin: No rash. Port-A-Cath without erythema.   Lab Results:  Lab Results  Component Value Date   WBC 6.4 06/13/2019   HGB 14.0 06/13/2019   HCT 44.1 06/13/2019   MCV 91.1 06/13/2019   PLT 142 (L) 06/13/2019   NEUTROABS 3.7 06/13/2019    Imaging:  No results found.  Medications: I have reviewed the patient's current medications.  Assessment/Plan: 1. 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 Well-differentiated adenocarcinoma, 0/13 lymph nodes positive, MSI-stable, no loss of mismatch repair protein expression ? Tumor involving the rectosigmoid junction ? Sigmoidoscopy 09/18/2017-distal sigmoid colon tumor beginning between 15 and 20 cm from the dentate line, completely obstructing ? CT abdomen/pelvis 09/14/2017-sigmoid thickening no evidence of metastatic disease ? 2 mm right upper lobe nodule on CT 08/04/2017 ? Colonoscopy 02/07/2018-polyps removed from the cecum, ascending colon, transverse colon, descending colon,  rectosigmoid colon and rectum (tubular adenomas)  2. Right lung pneumonia/empyema March 2019  Right VATS, drainage of empyema, and decortication of the right lower lobe 08/31/2017  3.CVA in 2010  4.Rectal and active sigmoid polyps noted on flexible sigmoidoscopy 09/18/2017  5.Pancreas tail mass/right liver lesions and 4 mm splenic lesion on CT abdomen/pelvis 05/27/2018  MRI abdomen 07/27/2018-right liver lesions, rim-enhancing lesion in the pancreas tail  Ultrasound-guided biopsy of a right liver lesion 08/14/2018-metastatic adenocarcinoma, cytokeratin 7+, CDX-2 weak positive, cytokeratin 20 negative, consistent with metastatic pancreas cancer; MSI stable; tumor mutational burden 0  CT abdomen/pelvis 09/26/2018-increased size of previously noted liver lesions, new right liver lesion, 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  Cycle 6 gemcitabine/Abraxane 12/20/2018  Cycle 7 gemcitabine/Abraxane 01/03/2019  Cycle 8 gemcitabine/Abraxane 01/17/2019  Cycle 9 gemcitabine/Abraxane 01/31/2019  Cycle 10 gemcitabine/Abraxane 02/13/2019  Cycle 11 gemcitabine/Abraxane 02/28/2019 (Abraxane held secondary to neuropathy symptoms)  Cycle 12 gemcitabine/Abraxane 03/14/2019 (Abraxane held due to persistent neuropathy symptoms)  Cycle 13 gemcitabine/Abraxane 03/28/2019 (Abraxane held due to persistent neuropathy symptoms)  Cycle 14 gemcitabine/Abraxane 04/11/2019 (Abraxane held due to persistent neuropathy symptoms)  CT abdomen/pelvis 04/28/2019-decrease in size of pancreatic mass. Decrease in size of hepatic metastatic  foci.   Cycle 15 gemcitabine12/05/2018(Abraxane held due to neuropathy symptoms)  Cycle 16 gemcitabine 05/16/2019 (Abraxane held due to neuropathy symptoms)  Cycle 17 gemcitabine 05/31/2019 (Abraxane on hold due  to neuropathy symptoms)  Cycle 18 gemcitabine 06/13/2019 (Abraxane held)  6.Port-A-Cath placement by Dr. Connor4/24/2020   Disposition: Alex [name] appears stable.  There is no clinical evidence of disease progression.  Plan to continue gemcitabine every 2 weeks, cycle 18 today.  We will follow-up on the CA 19-9 tumor marker.  We reviewed the CBC from today.  Counts adequate to proceed with treatment.  We will check a magnesium level due to the leg cramps.  He will return for lab, follow-up, gemcitabine in 2 weeks.  He will contact the office in the interim with any problems.    Ned Card ANP/GNP-BC   06/13/2019  9:34 AM

## 2019-06-14 LAB — CANCER ANTIGEN 19-9: CA 19-9: 23 U/mL (ref 0–35)

## 2019-06-16 ENCOUNTER — Telehealth: Payer: Self-pay | Admitting: Nurse Practitioner

## 2019-06-16 NOTE — Telephone Encounter (Signed)
Scheduled appt per 1/15 los.  Patient will get a print out at their next scheduled appt.

## 2019-06-27 ENCOUNTER — Inpatient Hospital Stay (HOSPITAL_BASED_OUTPATIENT_CLINIC_OR_DEPARTMENT_OTHER): Payer: Self-pay | Admitting: Oncology

## 2019-06-27 ENCOUNTER — Inpatient Hospital Stay: Payer: Self-pay

## 2019-06-27 ENCOUNTER — Other Ambulatory Visit: Payer: Self-pay

## 2019-06-27 VITALS — BP 147/86 | HR 60 | Temp 99.1°F | Resp 17 | Ht 66.0 in | Wt 211.9 lb

## 2019-06-27 DIAGNOSIS — C252 Malignant neoplasm of tail of pancreas: Secondary | ICD-10-CM

## 2019-06-27 DIAGNOSIS — Z95828 Presence of other vascular implants and grafts: Secondary | ICD-10-CM

## 2019-06-27 LAB — CBC WITH DIFFERENTIAL (CANCER CENTER ONLY)
Abs Immature Granulocytes: 0.04 10*3/uL (ref 0.00–0.07)
Basophils Absolute: 0.1 10*3/uL (ref 0.0–0.1)
Basophils Relative: 1 %
Eosinophils Absolute: 0.2 10*3/uL (ref 0.0–0.5)
Eosinophils Relative: 3 %
HCT: 44.9 % (ref 39.0–52.0)
Hemoglobin: 14.5 g/dL (ref 13.0–17.0)
Immature Granulocytes: 1 %
Lymphocytes Relative: 20 %
Lymphs Abs: 1.5 10*3/uL (ref 0.7–4.0)
MCH: 29.5 pg (ref 26.0–34.0)
MCHC: 32.3 g/dL (ref 30.0–36.0)
MCV: 91.3 fL (ref 80.0–100.0)
Monocytes Absolute: 1.2 10*3/uL — ABNORMAL HIGH (ref 0.1–1.0)
Monocytes Relative: 15 %
Neutro Abs: 4.6 10*3/uL (ref 1.7–7.7)
Neutrophils Relative %: 60 %
Platelet Count: 143 10*3/uL — ABNORMAL LOW (ref 150–400)
RBC: 4.92 MIL/uL (ref 4.22–5.81)
RDW: 15 % (ref 11.5–15.5)
WBC Count: 7.6 10*3/uL (ref 4.0–10.5)
nRBC: 0 % (ref 0.0–0.2)

## 2019-06-27 LAB — CMP (CANCER CENTER ONLY)
ALT: 26 U/L (ref 0–44)
AST: 30 U/L (ref 15–41)
Albumin: 3.9 g/dL (ref 3.5–5.0)
Alkaline Phosphatase: 54 U/L (ref 38–126)
Anion gap: 8 (ref 5–15)
BUN: 17 mg/dL (ref 8–23)
CO2: 28 mmol/L (ref 22–32)
Calcium: 8.5 mg/dL — ABNORMAL LOW (ref 8.9–10.3)
Chloride: 103 mmol/L (ref 98–111)
Creatinine: 0.98 mg/dL (ref 0.61–1.24)
GFR, Est AFR Am: >60 mL/min (ref >60)
GFR, Estimated: >60 mL/min (ref >60)
Glucose, Bld: 99 mg/dL (ref 70–99)
Potassium: 3.9 mmol/L (ref 3.5–5.1)
Sodium: 139 mmol/L (ref 135–145)
Total Bilirubin: 0.6 mg/dL (ref 0.3–1.2)
Total Protein: 6.9 g/dL (ref 6.5–8.1)

## 2019-06-27 MED ORDER — SODIUM CHLORIDE 0.9% FLUSH
10.0000 mL | INTRAVENOUS | Status: DC | PRN
  Administered 2019-06-27: 10 mL
  Filled 2019-06-27: qty 10

## 2019-06-27 MED ORDER — PROCHLORPERAZINE MALEATE 10 MG PO TABS
ORAL_TABLET | ORAL | Status: AC
  Filled 2019-06-27: qty 1

## 2019-06-27 MED ORDER — SODIUM CHLORIDE 0.9 % IV SOLN
Freq: Once | INTRAVENOUS | Status: AC
  Filled 2019-06-27: qty 250

## 2019-06-27 MED ORDER — HEPARIN SOD (PORK) LOCK FLUSH 100 UNIT/ML IV SOLN
500.0000 [IU] | Freq: Once | INTRAVENOUS | Status: AC | PRN
  Administered 2019-06-27: 500 [IU]
  Filled 2019-06-27: qty 5

## 2019-06-27 MED ORDER — SODIUM CHLORIDE 0.9 % IV SOLN
2000.0000 mg | Freq: Once | INTRAVENOUS | Status: AC
  Administered 2019-06-27: 2000 mg via INTRAVENOUS
  Filled 2019-06-27: qty 52.6

## 2019-06-27 MED ORDER — PROCHLORPERAZINE MALEATE 10 MG PO TABS
10.0000 mg | ORAL_TABLET | Freq: Once | ORAL | Status: AC
  Administered 2019-06-27: 10 mg via ORAL

## 2019-06-27 NOTE — Patient Instructions (Signed)

## 2019-06-27 NOTE — Progress Notes (Signed)
[facility] OFFICE PROGRESS NOTE   Diagnosis: Pancreas cancer  INTERVAL HISTORY:   Alex [name] completed another treatment with gemcitabine on 06/13/2019.  He feels well.  No pain.  Good appetite.  No fever or rash.  Stable peripheral numbness.  This does not interfere with activity.  Objective:  Vital signs in last 24 hours:  Blood pressure (!) 147/86, pulse 60, temperature 99.1 F (37.3 C), temperature source Temporal, resp. rate 17, height 5' 6" (1.676 m), weight 211 lb 14.4 oz (96.1 kg), SpO2 98 %.    GI: No hepatomegaly, no mass, nontender, left lower quadrant colostomy with brown stool Vascular: No leg edema  Skin: No rash  Portacath/PICC-without erythema  Lab Results:  Lab Results  Component Value Date   WBC 7.6 06/27/2019   HGB 14.5 06/27/2019   HCT 44.9 06/27/2019   MCV 91.3 06/27/2019   PLT 143 (L) 06/27/2019   NEUTROABS 4.6 06/27/2019    CMP  Lab Results  Component Value Date   NA 139 06/27/2019   K 3.9 06/27/2019   CL 103 06/27/2019   CO2 28 06/27/2019   GLUCOSE 99 06/27/2019   BUN 17 06/27/2019   CREATININE 0.98 06/27/2019   CALCIUM 8.5 (L) 06/27/2019   PROT 6.9 06/27/2019   ALBUMIN 3.9 06/27/2019   AST 30 06/27/2019   ALT 26 06/27/2019   ALKPHOS 54 06/27/2019   BILITOT 0.6 06/27/2019   GFRNONAA >60 06/27/2019   GFRAA >60 06/27/2019      Medications: I have reviewed the patient's current medications.   Assessment/Plan: 1. 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 Well-differentiated adenocarcinoma, 0/13 lymph nodes positive, MSI-stable, no loss of mismatch repair protein expression ? Tumor involving the rectosigmoid junction ? Sigmoidoscopy 09/18/2017-distal sigmoid colon tumor beginning between 15 and 20 cm from the dentate line, completely obstructing ? CT abdomen/pelvis 09/14/2017-sigmoid thickening no evidence of metastatic disease ? 2 mm  right upper lobe nodule on CT 08/04/2017 ? Colonoscopy 02/07/2018-polyps removed from the cecum, ascending colon, transverse colon, descending colon, rectosigmoid colon and rectum (tubular adenomas)  2. Right lung pneumonia/empyema March 2019  Right VATS, drainage of empyema, and decortication of the right lower lobe 08/31/2017  3.CVA in 2010  4.Rectal and active sigmoid polyps noted on flexible sigmoidoscopy 09/18/2017  5.Pancreas tail mass/right liver lesions and 4 mm splenic lesion on CT abdomen/pelvis 05/27/2018  MRI abdomen 07/27/2018-right liver lesions, rim-enhancing lesion in the pancreas tail  Ultrasound-guided biopsy of a right liver lesion 08/14/2018-metastatic adenocarcinoma, cytokeratin 7+, CDX-2 weak positive, cytokeratin 20 negative, consistent with metastatic pancreas cancer; MSI stable; tumor mutational burden 0  CT abdomen/pelvis 09/26/2018-increased size of previously noted liver lesions, new right liver lesion, 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  Cycle 6 gemcitabine/Abraxane 12/20/2018  Cycle 7 gemcitabine/Abraxane 01/03/2019  Cycle 8 gemcitabine/Abraxane 01/17/2019  Cycle 9 gemcitabine/Abraxane 01/31/2019  Cycle 10 gemcitabine/Abraxane 02/13/2019  Cycle 11 gemcitabine/Abraxane 02/28/2019 (Abraxane held secondary to neuropathy symptoms)  Cycle 12 gemcitabine/Abraxane 03/14/2019 (Abraxane held due to persistent neuropathy symptoms)  Cycle 13 gemcitabine/Abraxane 03/28/2019 (Abraxane held due to persistent neuropathy symptoms)  Cycle 14 gemcitabine/Abraxane 04/11/2019 (Abraxane held  due to persistent neuropathy symptoms)  CT abdomen/pelvis 04/28/2019-decrease in size of pancreatic mass. Decrease in size of hepatic metastatic foci.   Cycle 15 gemcitabine12/05/2018(Abraxane held due to neuropathy symptoms)  Cycle 16 gemcitabine 05/16/2019 (Abraxane held due to neuropathy symptoms)  Cycle 17 gemcitabine 05/31/2019 (Abraxane on hold due to neuropathy symptoms)  Cycle 18 gemcitabine 06/13/2019 (Abraxane held)  Cycle 19 gemcitabine 06/27/2019 (Abraxane held)  6.Port-A-Cath placement by Dr. Connor4/24/2020     Disposition: Alex [name] appears stable.  He will continue gemcitabine chemotherapy.  Abraxane remains on hold secondary to neuropathy.  He will return for office visit and chemotherapy in 2 weeks.  I encouraged Alex [name] to obtain the COVID-19 vaccine.  Betsy Coder, MD  06/27/2019  9:46 AM

## 2019-06-27 NOTE — Patient Instructions (Signed)
[facility] °Discharge Instructions for Patients Receiving Chemotherapy ° °Today you received the following chemotherapy agents Gemzar ° °To help prevent nausea and vomiting after your treatment, we encourage you to take your nausea medication as directed. °  °If you develop nausea and vomiting that is not controlled by your nausea medication, call the clinic.  ° °BELOW ARE SYMPTOMS THAT SHOULD BE REPORTED IMMEDIATELY: °· *FEVER GREATER THAN 100.5 F °· *CHILLS WITH OR WITHOUT FEVER °· NAUSEA AND VOMITING THAT IS NOT CONTROLLED WITH YOUR NAUSEA MEDICATION °· *UNUSUAL SHORTNESS OF BREATH °· *UNUSUAL BRUISING OR BLEEDING °· TENDERNESS IN MOUTH AND THROAT WITH OR WITHOUT PRESENCE OF ULCERS °· *URINARY PROBLEMS °· *BOWEL PROBLEMS °· UNUSUAL RASH °Items with * indicate a potential emergency and should be followed up as soon as possible. ° °Feel free to call the clinic should you have any questions or concerns. The clinic phone number is [phone]. ° °Please show the CHEMO ALERT CARD at check-in to the Emergency Department and triage nurse. ° ° °

## 2019-07-06 ENCOUNTER — Other Ambulatory Visit: Payer: Self-pay | Admitting: Oncology

## 2019-07-11 ENCOUNTER — Inpatient Hospital Stay: Payer: Self-pay | Attending: Nurse Practitioner

## 2019-07-11 ENCOUNTER — Inpatient Hospital Stay: Payer: Self-pay

## 2019-07-11 ENCOUNTER — Inpatient Hospital Stay (HOSPITAL_BASED_OUTPATIENT_CLINIC_OR_DEPARTMENT_OTHER): Payer: Self-pay | Admitting: Oncology

## 2019-07-11 ENCOUNTER — Other Ambulatory Visit: Payer: Self-pay

## 2019-07-11 DIAGNOSIS — Z5111 Encounter for antineoplastic chemotherapy: Secondary | ICD-10-CM | POA: Insufficient documentation

## 2019-07-11 DIAGNOSIS — C787 Secondary malignant neoplasm of liver and intrahepatic bile duct: Secondary | ICD-10-CM | POA: Insufficient documentation

## 2019-07-11 DIAGNOSIS — C252 Malignant neoplasm of tail of pancreas: Secondary | ICD-10-CM

## 2019-07-11 DIAGNOSIS — C19 Malignant neoplasm of rectosigmoid junction: Secondary | ICD-10-CM | POA: Insufficient documentation

## 2019-07-11 DIAGNOSIS — Z95828 Presence of other vascular implants and grafts: Secondary | ICD-10-CM

## 2019-07-11 LAB — CMP (CANCER CENTER ONLY)
ALT: 26 U/L (ref 0–44)
AST: 32 U/L (ref 15–41)
Albumin: 3.6 g/dL (ref 3.5–5.0)
Alkaline Phosphatase: 51 U/L (ref 38–126)
Anion gap: 9 (ref 5–15)
BUN: 15 mg/dL (ref 8–23)
CO2: 29 mmol/L (ref 22–32)
Calcium: 8.5 mg/dL — ABNORMAL LOW (ref 8.9–10.3)
Chloride: 103 mmol/L (ref 98–111)
Creatinine: 0.9 mg/dL (ref 0.61–1.24)
GFR, Est AFR Am: >60 mL/min (ref >60)
GFR, Estimated: >60 mL/min (ref >60)
Glucose, Bld: 173 mg/dL — ABNORMAL HIGH (ref 70–99)
Potassium: 3.5 mmol/L (ref 3.5–5.1)
Sodium: 141 mmol/L (ref 135–145)
Total Bilirubin: 0.4 mg/dL (ref 0.3–1.2)
Total Protein: 6.4 g/dL — ABNORMAL LOW (ref 6.5–8.1)

## 2019-07-11 LAB — CBC WITH DIFFERENTIAL (CANCER CENTER ONLY)
Abs Immature Granulocytes: 0.02 10*3/uL (ref 0.00–0.07)
Basophils Absolute: 0 10*3/uL (ref 0.0–0.1)
Basophils Relative: 1 %
Eosinophils Absolute: 0.2 10*3/uL (ref 0.0–0.5)
Eosinophils Relative: 4 %
HCT: 43.1 % (ref 39.0–52.0)
Hemoglobin: 13.9 g/dL (ref 13.0–17.0)
Immature Granulocytes: 0 %
Lymphocytes Relative: 20 %
Lymphs Abs: 1.3 10*3/uL (ref 0.7–4.0)
MCH: 29.2 pg (ref 26.0–34.0)
MCHC: 32.3 g/dL (ref 30.0–36.0)
MCV: 90.5 fL (ref 80.0–100.0)
Monocytes Absolute: 0.8 10*3/uL (ref 0.1–1.0)
Monocytes Relative: 12 %
Neutro Abs: 4 10*3/uL (ref 1.7–7.7)
Neutrophils Relative %: 63 %
Platelet Count: 124 10*3/uL — ABNORMAL LOW (ref 150–400)
RBC: 4.76 MIL/uL (ref 4.22–5.81)
RDW: 14.8 % (ref 11.5–15.5)
WBC Count: 6.3 10*3/uL (ref 4.0–10.5)
nRBC: 0 % (ref 0.0–0.2)

## 2019-07-11 MED ORDER — PROCHLORPERAZINE MALEATE 10 MG PO TABS
ORAL_TABLET | ORAL | Status: AC
  Filled 2019-07-11: qty 1

## 2019-07-11 MED ORDER — SODIUM CHLORIDE 0.9% FLUSH
10.0000 mL | INTRAVENOUS | Status: DC | PRN
  Administered 2019-07-11: 10 mL
  Filled 2019-07-11: qty 10

## 2019-07-11 MED ORDER — HEPARIN SOD (PORK) LOCK FLUSH 100 UNIT/ML IV SOLN
500.0000 [IU] | Freq: Once | INTRAVENOUS | Status: AC | PRN
  Administered 2019-07-11: 500 [IU]
  Filled 2019-07-11: qty 5

## 2019-07-11 MED ORDER — PROCHLORPERAZINE MALEATE 10 MG PO TABS
10.0000 mg | ORAL_TABLET | Freq: Once | ORAL | Status: AC
  Administered 2019-07-11: 10 mg via ORAL

## 2019-07-11 MED ORDER — SODIUM CHLORIDE 0.9 % IV SOLN
Freq: Once | INTRAVENOUS | Status: AC
  Filled 2019-07-11: qty 250

## 2019-07-11 MED ORDER — SODIUM CHLORIDE 0.9% FLUSH
10.0000 mL | INTRAVENOUS | Status: DC | PRN
  Administered 2019-07-11: 09:00:00 10 mL
  Filled 2019-07-11: qty 10

## 2019-07-11 MED ORDER — SODIUM CHLORIDE 0.9 % IV SOLN
2000.0000 mg | Freq: Once | INTRAVENOUS | Status: AC
  Administered 2019-07-11: 2000 mg via INTRAVENOUS
  Filled 2019-07-11: qty 52.6

## 2019-07-11 MED ORDER — POTASSIUM CHLORIDE CRYS ER 20 MEQ PO TBCR: 20.0000 meq | EXTENDED_RELEASE_TABLET | Freq: Every day | ORAL | 2 refills | Status: DC

## 2019-07-11 NOTE — Progress Notes (Signed)
[name] OFFICE PROGRESS NOTE   Diagnosis: Pancreas cancer  INTERVAL HISTORY:   Alex [name] completed another treatment with gemcitabine on 06/27/2019.  No fever, nausea, or rash.  Stable numbness in the hands and feet.  Objective:  Vital signs in last 24 hours:  Blood pressure 132/85, pulse 63, temperature 98.2 F (36.8 C), temperature source Temporal, resp. rate 18, height 5' 6" (1.676 m), weight 214 lb 9.6 oz (97.3 kg), SpO2 98 %.    Limited physical examination secondary to distancing with the Covid pandemic GI: No hepatomegaly, no mass, nontender, left lower quadrant colostomy Vascular: No leg edema   Portacath/PICC-without erythema  Lab Results:  Lab Results  Component Value Date   WBC 6.3 07/11/2019   HGB 13.9 07/11/2019   HCT 43.1 07/11/2019   MCV 90.5 07/11/2019   PLT 124 (L) 07/11/2019   NEUTROABS 4.0 07/11/2019    CMP  Lab Results  Component Value Date   NA 141 07/11/2019   K 3.5 07/11/2019   CL 103 07/11/2019   CO2 29 07/11/2019   GLUCOSE 173 (H) 07/11/2019   BUN 15 07/11/2019   CREATININE 0.90 07/11/2019   CALCIUM 8.5 (L) 07/11/2019   PROT 6.4 (L) 07/11/2019   ALBUMIN 3.6 07/11/2019   AST 32 07/11/2019   ALT 26 07/11/2019   ALKPHOS 51 07/11/2019   BILITOT 0.4 07/11/2019   GFRNONAA >60 07/11/2019   GFRAA >60 07/11/2019   CA 19-9 on 06/13/2019-23  Medications: I have reviewed the patient's current medications.   Assessment/Plan: 1. 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 Well-differentiated adenocarcinoma, 0/13 lymph nodes positive, MSI-stable, no loss of mismatch repair protein expression ? Tumor involving the rectosigmoid junction ? Sigmoidoscopy 09/18/2017-distal sigmoid colon tumor beginning between 15 and 20 cm from the dentate line, completely obstructing ? CT abdomen/pelvis 09/14/2017-sigmoid thickening no evidence of metastatic  disease ? 2 mm right upper lobe nodule on CT 08/04/2017 ? Colonoscopy 02/07/2018-polyps removed from the cecum, ascending colon, transverse colon, descending colon, rectosigmoid colon and rectum (tubular adenomas)  2. Right lung pneumonia/empyema March 2019  Right VATS, drainage of empyema, and decortication of the right lower lobe 08/31/2017  3.CVA in 2010  4.Rectal and active sigmoid polyps noted on flexible sigmoidoscopy 09/18/2017  5.Pancreas tail mass/right liver lesions and 4 mm splenic lesion on CT abdomen/pelvis 05/27/2018  MRI abdomen 07/27/2018-right liver lesions, rim-enhancing lesion in the pancreas tail  Ultrasound-guided biopsy of a right liver lesion 08/14/2018-metastatic adenocarcinoma, cytokeratin 7+, CDX-2 weak positive, cytokeratin 20 negative, consistent with metastatic pancreas cancer; MSI stable; tumor mutational burden 0  CT abdomen/pelvis 09/26/2018-increased size of previously noted liver lesions, new right liver lesion, 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  Cycle 6 gemcitabine/Abraxane 12/20/2018  Cycle 7 gemcitabine/Abraxane 01/03/2019  Cycle 8 gemcitabine/Abraxane 01/17/2019  Cycle 9 gemcitabine/Abraxane 01/31/2019  Cycle 10 gemcitabine/Abraxane 02/13/2019  Cycle 11 gemcitabine/Abraxane 02/28/2019 (Abraxane held secondary to neuropathy symptoms)  Cycle 12 gemcitabine/Abraxane 03/14/2019 (Abraxane held due to persistent neuropathy symptoms)  Cycle 13 gemcitabine/Abraxane 03/28/2019 (Abraxane held due to persistent neuropathy symptoms)  Cycle 14 gemcitabine/Abraxane 04/11/2019  (Abraxane held due to persistent neuropathy symptoms)  CT abdomen/pelvis 04/28/2019-decrease in  size of pancreatic mass. Decrease in size of hepatic metastatic foci.   Cycle 15 gemcitabine12/05/2018(Abraxane held due to neuropathy symptoms)  Cycle 16 gemcitabine 05/16/2019 (Abraxane held due to neuropathy symptoms)  Cycle 17 gemcitabine 05/31/2019 (Abraxane on hold due to neuropathy symptoms)  Cycle 18 gemcitabine 06/13/2019 (Abraxane held)  Cycle 19 gemcitabine 06/27/2019 (Abraxane held)  6.Port-A-Cath placement by Dr. Connor4/24/2020   Disposition: Alex [name] appears stable.  He continues to tolerate the gemcitabine well.  There is no evidence for progression of pancreas cancer.  He will complete another treatment with gemcitabine today.  Alex [name] will return for an office visit and chemotherapy in 2 weeks.  He will be scheduled for restaging CT prior to the office visit on 08/08/2019.  Betsy Coder, MD  07/11/2019  9:49 AM

## 2019-07-11 NOTE — Patient Instructions (Signed)
[facility] Discharge Instructions for Patients Receiving Chemotherapy  Today you received the following chemotherapy agent: Gemcitabine (Gemzar)  To help prevent nausea and vomiting after your treatment, we encourage you to take your nausea medication as directed by your MD.   If you develop nausea and vomiting that is not controlled by your nausea medication, call the clinic.   BELOW ARE SYMPTOMS THAT SHOULD BE REPORTED IMMEDIATELY:  *FEVER GREATER THAN 100.5 F  *CHILLS WITH OR WITHOUT FEVER  NAUSEA AND VOMITING THAT IS NOT CONTROLLED WITH YOUR NAUSEA MEDICATION  *UNUSUAL SHORTNESS OF BREATH  *UNUSUAL BRUISING OR BLEEDING  TENDERNESS IN MOUTH AND THROAT WITH OR WITHOUT PRESENCE OF ULCERS  *URINARY PROBLEMS  *BOWEL PROBLEMS  UNUSUAL RASH Items with * indicate a potential emergency and should be followed up as soon as possible.  Feel free to call the clinic should you have any questions or concerns. The clinic phone number is [phone].  Please show the [place] at check-in to the Emergency Department and triage nurse.  Coronavirus (COVID-19) Are you at risk?  Are you at risk for the Coronavirus (COVID-19)?  To be considered HIGH RISK for Coronavirus (COVID-19), you have to meet the following criteria:  . Traveled to [place], [place], [place], [place] or [place]; or in the [place] to [place], [place], [place], or [place]; and have fever, cough, and shortness of breath within the last 2 weeks of travel OR . Been in close contact with a person diagnosed with COVID-19 within the last 2 weeks and have fever, cough, and shortness of breath . IF YOU DO NOT MEET THESE CRITERIA, YOU ARE CONSIDERED LOW RISK FOR COVID-19.  What to do if you are HIGH RISK for COVID-19?  Marland Kitchen If you are having a medical emergency, call 911. . Seek medical care right away. Before you go to a doctor's office, urgent care or emergency department, call ahead and tell  them about your recent travel, contact with someone diagnosed with COVID-19, and your symptoms. You should receive instructions from your physician's office regarding next steps of care.  . When you arrive at healthcare provider, tell the healthcare staff immediately you have returned from visiting [place], [place], [place], [place] or [place]; or traveled in the [place] to [place], [place], [place], or [place]; in the last two weeks or you have been in close contact with a person diagnosed with COVID-19 in the last 2 weeks.   . Tell the health care staff about your symptoms: fever, cough and shortness of breath. . After you have been seen by a medical provider, you will be either: o Tested for (COVID-19) and discharged home on quarantine except to seek medical care if symptoms worsen, and asked to  - Stay home and avoid contact with others until you get your results (4-5 days)  - Avoid travel on public transportation if possible (such as bus, train, or airplane) or o Sent to the Emergency Department by EMS for evaluation, COVID-19 testing, and possible admission depending on your condition and test results.  What to do if you are LOW RISK for COVID-19?  Reduce your risk of any infection by using the same precautions used for avoiding the common cold or flu:  Marland Kitchen Wash your hands often with soap and warm water for at least 20 seconds.  If soap and water are not readily available, use an alcohol-based hand sanitizer with at least 60% alcohol.  Marland Kitchen  If coughing or sneezing, cover your mouth and nose by coughing or sneezing into the elbow areas of your shirt or coat, into a tissue or into your sleeve (not your hands). . Avoid shaking hands with others and consider head nods or verbal greetings only. . Avoid touching your eyes, nose, or mouth with unwashed hands.  . Avoid close contact with people who are sick. . Avoid places or events with large numbers of people in one location, like concerts or  sporting events. . Carefully consider travel plans you have or are making. . If you are planning any travel outside or inside the [place], visit the CDC's Travelers' Health webpage for the latest health notices. . If you have some symptoms but not all symptoms, continue to monitor at home and seek medical attention if your symptoms worsen. . If you are having a medical emergency, call 911.   [place] / e-Visit: [email or web address]         [facility]: [facility]: [phone]                   [facility]: [phone]

## 2019-07-11 NOTE — Addendum Note (Signed)
Addended by: Betsy Coder B on: 07/11/2019 10:27 AM   Modules accepted: Orders

## 2019-07-12 LAB — CANCER ANTIGEN 19-9: CA 19-9: 30 U/mL (ref 0–35)

## 2019-07-15 ENCOUNTER — Telehealth: Payer: Self-pay | Admitting: Oncology

## 2019-07-15 NOTE — Telephone Encounter (Signed)
Scheduled per los. Called and left msg. mailed printout  

## 2019-07-20 ENCOUNTER — Other Ambulatory Visit: Payer: Self-pay | Admitting: Oncology

## 2019-07-25 ENCOUNTER — Inpatient Hospital Stay: Payer: Self-pay

## 2019-07-25 ENCOUNTER — Inpatient Hospital Stay (HOSPITAL_BASED_OUTPATIENT_CLINIC_OR_DEPARTMENT_OTHER): Payer: Self-pay | Admitting: Nurse Practitioner

## 2019-07-25 ENCOUNTER — Other Ambulatory Visit: Payer: Self-pay

## 2019-07-25 ENCOUNTER — Encounter: Payer: Self-pay | Admitting: Nurse Practitioner

## 2019-07-25 VITALS — BP 128/76 | HR 65 | Temp 98.5°F | Resp 18 | Ht 66.0 in | Wt 214.2 lb

## 2019-07-25 DIAGNOSIS — C252 Malignant neoplasm of tail of pancreas: Secondary | ICD-10-CM

## 2019-07-25 DIAGNOSIS — Z95828 Presence of other vascular implants and grafts: Secondary | ICD-10-CM

## 2019-07-25 LAB — CBC WITH DIFFERENTIAL (CANCER CENTER ONLY)
Abs Immature Granulocytes: 0.03 10*3/uL (ref 0.00–0.07)
Basophils Absolute: 0.1 10*3/uL (ref 0.0–0.1)
Basophils Relative: 1 %
Eosinophils Absolute: 0.3 10*3/uL (ref 0.0–0.5)
Eosinophils Relative: 4 %
HCT: 45.1 % (ref 39.0–52.0)
Hemoglobin: 14.3 g/dL (ref 13.0–17.0)
Immature Granulocytes: 0 %
Lymphocytes Relative: 17 %
Lymphs Abs: 1.2 10*3/uL (ref 0.7–4.0)
MCH: 29.3 pg (ref 26.0–34.0)
MCHC: 31.7 g/dL (ref 30.0–36.0)
MCV: 92.4 fL (ref 80.0–100.0)
Monocytes Absolute: 1.1 10*3/uL — ABNORMAL HIGH (ref 0.1–1.0)
Monocytes Relative: 15 %
Neutro Abs: 4.4 10*3/uL (ref 1.7–7.7)
Neutrophils Relative %: 63 %
Platelet Count: 151 10*3/uL (ref 150–400)
RBC: 4.88 MIL/uL (ref 4.22–5.81)
RDW: 14.7 % (ref 11.5–15.5)
WBC Count: 7.1 10*3/uL (ref 4.0–10.5)
nRBC: 0 % (ref 0.0–0.2)

## 2019-07-25 LAB — CMP (CANCER CENTER ONLY)
ALT: 27 U/L (ref 0–44)
AST: 35 U/L (ref 15–41)
Albumin: 3.6 g/dL (ref 3.5–5.0)
Alkaline Phosphatase: 59 U/L (ref 38–126)
Anion gap: 8 (ref 5–15)
BUN: 13 mg/dL (ref 8–23)
CO2: 29 mmol/L (ref 22–32)
Calcium: 8.8 mg/dL — ABNORMAL LOW (ref 8.9–10.3)
Chloride: 103 mmol/L (ref 98–111)
Creatinine: 0.9 mg/dL (ref 0.61–1.24)
GFR, Est AFR Am: >60 mL/min (ref >60)
GFR, Estimated: >60 mL/min (ref >60)
Glucose, Bld: 111 mg/dL — ABNORMAL HIGH (ref 70–99)
Potassium: 3.8 mmol/L (ref 3.5–5.1)
Sodium: 140 mmol/L (ref 135–145)
Total Bilirubin: 0.6 mg/dL (ref 0.3–1.2)
Total Protein: 6.9 g/dL (ref 6.5–8.1)

## 2019-07-25 MED ORDER — SODIUM CHLORIDE 0.9% FLUSH
10.0000 mL | INTRAVENOUS | Status: DC | PRN
  Administered 2019-07-25: 10 mL
  Filled 2019-07-25: qty 10

## 2019-07-25 MED ORDER — PROCHLORPERAZINE MALEATE 10 MG PO TABS
10.0000 mg | ORAL_TABLET | Freq: Once | ORAL | Status: AC
  Administered 2019-07-25: 10 mg via ORAL

## 2019-07-25 MED ORDER — SODIUM CHLORIDE 0.9 % IV SOLN
Freq: Once | INTRAVENOUS | Status: AC
  Filled 2019-07-25: qty 250

## 2019-07-25 MED ORDER — HEPARIN SOD (PORK) LOCK FLUSH 100 UNIT/ML IV SOLN
500.0000 [IU] | Freq: Once | INTRAVENOUS | Status: AC | PRN
  Administered 2019-07-25: 500 [IU]
  Filled 2019-07-25: qty 5

## 2019-07-25 MED ORDER — PROCHLORPERAZINE MALEATE 10 MG PO TABS
ORAL_TABLET | ORAL | Status: AC
  Filled 2019-07-25: qty 1

## 2019-07-25 MED ORDER — SODIUM CHLORIDE 0.9 % IV SOLN
2000.0000 mg | Freq: Once | INTRAVENOUS | Status: AC
  Administered 2019-07-25: 2000 mg via INTRAVENOUS
  Filled 2019-07-25: qty 52.6

## 2019-07-25 NOTE — Patient Instructions (Signed)
[facility] Discharge Instructions for Patients Receiving Chemotherapy  Today you received the following chemotherapy agent: Gemcitabine (Gemzar)  To help prevent nausea and vomiting after your treatment, we encourage you to take your nausea medication as directed by your MD.   If you develop nausea and vomiting that is not controlled by your nausea medication, call the clinic.   BELOW ARE SYMPTOMS THAT SHOULD BE REPORTED IMMEDIATELY:  *FEVER GREATER THAN 100.5 F  *CHILLS WITH OR WITHOUT FEVER  NAUSEA AND VOMITING THAT IS NOT CONTROLLED WITH YOUR NAUSEA MEDICATION  *UNUSUAL SHORTNESS OF BREATH  *UNUSUAL BRUISING OR BLEEDING  TENDERNESS IN MOUTH AND THROAT WITH OR WITHOUT PRESENCE OF ULCERS  *URINARY PROBLEMS  *BOWEL PROBLEMS  UNUSUAL RASH Items with * indicate a potential emergency and should be followed up as soon as possible.  Feel free to call the clinic should you have any questions or concerns. The clinic phone number is [phone].  Please show the [place] at check-in to the Emergency Department and triage nurse.  Coronavirus (COVID-19) Are you at risk?  Are you at risk for the Coronavirus (COVID-19)?  To be considered HIGH RISK for Coronavirus (COVID-19), you have to meet the following criteria:  . Traveled to [place], [place], [place], [place] or [place]; or in the [place] to [place], [place], [place], or [place]; and have fever, cough, and shortness of breath within the last 2 weeks of travel OR . Been in close contact with a person diagnosed with COVID-19 within the last 2 weeks and have fever, cough, and shortness of breath . IF YOU DO NOT MEET THESE CRITERIA, YOU ARE CONSIDERED LOW RISK FOR COVID-19.  What to do if you are HIGH RISK for COVID-19?  Marland Kitchen If you are having a medical emergency, call 911. . Seek medical care right away. Before you go to a doctor's office, urgent care or emergency department, call ahead and tell  them about your recent travel, contact with someone diagnosed with COVID-19, and your symptoms. You should receive instructions from your physician's office regarding next steps of care.  . When you arrive at healthcare provider, tell the healthcare staff immediately you have returned from visiting [place], [place], [place], [place] or [place]; or traveled in the [place] to [place], [place], [place], or [place]; in the last two weeks or you have been in close contact with a person diagnosed with COVID-19 in the last 2 weeks.   . Tell the health care staff about your symptoms: fever, cough and shortness of breath. . After you have been seen by a medical provider, you will be either: o Tested for (COVID-19) and discharged home on quarantine except to seek medical care if symptoms worsen, and asked to  - Stay home and avoid contact with others until you get your results (4-5 days)  - Avoid travel on public transportation if possible (such as bus, train, or airplane) or o Sent to the Emergency Department by EMS for evaluation, COVID-19 testing, and possible admission depending on your condition and test results.  What to do if you are LOW RISK for COVID-19?  Reduce your risk of any infection by using the same precautions used for avoiding the common cold or flu:  Marland Kitchen Wash your hands often with soap and warm water for at least 20 seconds.  If soap and water are not readily available, use an alcohol-based hand sanitizer with at least 60% alcohol.  Marland Kitchen  If coughing or sneezing, cover your mouth and nose by coughing or sneezing into the elbow areas of your shirt or coat, into a tissue or into your sleeve (not your hands). . Avoid shaking hands with others and consider head nods or verbal greetings only. . Avoid touching your eyes, nose, or mouth with unwashed hands.  . Avoid close contact with people who are sick. . Avoid places or events with large numbers of people in one location, like concerts or  sporting events. . Carefully consider travel plans you have or are making. . If you are planning any travel outside or inside the [place], visit the CDC's Travelers' Health webpage for the latest health notices. . If you have some symptoms but not all symptoms, continue to monitor at home and seek medical attention if your symptoms worsen. . If you are having a medical emergency, call 911.   [place] / e-Visit: [email or web address]         [facility]: [facility]: [phone]                   [facility]: [phone]

## 2019-07-25 NOTE — Progress Notes (Signed)
Alex [name] OFFICE PROGRESS NOTE   Diagnosis: Pancreas cancer  INTERVAL HISTORY:   Alex [name] returns as scheduled.  He completed a cycle of gemcitabine 07/11/2019.  He has stable neuropathy symptoms in the hands and feet.  No nausea or vomiting.  No mouth sores.  No diarrhea.  No rash or fever.  He has an occasional cough.  No shortness of breath.  He has a good appetite and good energy level.  No abdominal pain.  Objective:  Vital signs in last 24 hours:  Blood pressure 128/76, pulse 65, temperature 98.5 F (36.9 C), temperature source Temporal, resp. rate 18, height '5\' 6"'  (1.676 m), weight 214 lb 3.2 oz (97.2 kg), SpO2 99 %.    HEENT: No thrush or ulcers. GI: Abdomen soft and nontender.  No hepatomegaly.  No mass. Vascular: No leg edema.  Skin: Palms without erythema. Port-A-Cath without erythema.   Lab Results:  Lab Results  Component Value Date   WBC 7.1 07/25/2019   HGB 14.3 07/25/2019   HCT 45.1 07/25/2019   MCV 92.4 07/25/2019   PLT 151 07/25/2019   NEUTROABS 4.4 07/25/2019    Imaging:  No results found.  Medications: I have reviewed the patient's current medications.  Assessment/Plan: 1. 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 Well-differentiated adenocarcinoma, 0/13 lymph nodes positive, MSI-stable, no loss of mismatch repair protein expression ? Tumor involving the rectosigmoid junction ? Sigmoidoscopy 09/18/2017-distal sigmoid colon tumor beginning between 15 and 20 cm from the dentate line, completely obstructing ? CT abdomen/pelvis 09/14/2017-sigmoid thickening no evidence of metastatic disease ? 2 mm right upper lobe nodule on CT 08/04/2017 ? Colonoscopy 02/07/2018-polyps removed from the cecum, ascending colon, transverse colon, descending colon, rectosigmoid colon and rectum (tubular adenomas)  2. Right lung pneumonia/empyema March  2019  Right VATS, drainage of empyema, and decortication of the right lower lobe 08/31/2017  3.CVA in 2010  4.Rectal and active sigmoid polyps noted on flexible sigmoidoscopy 09/18/2017  5.Pancreas tail mass/right liver lesions and 4 mm splenic lesion on CT abdomen/pelvis 05/27/2018  MRI abdomen 07/27/2018-right liver lesions, rim-enhancing lesion in the pancreas tail  Ultrasound-guided biopsy of a right liver lesion 08/14/2018-metastatic adenocarcinoma, cytokeratin 7+, CDX-2 weak positive, cytokeratin 20 negative, consistent with metastatic pancreas cancer; MSI stable; tumor mutational burden 0  CT abdomen/pelvis 09/26/2018-increased size of previously noted liver lesions, new right liver lesion, 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  Cycle 6 gemcitabine/Abraxane 12/20/2018  Cycle 7 gemcitabine/Abraxane 01/03/2019  Cycle 8 gemcitabine/Abraxane 01/17/2019  Cycle 9 gemcitabine/Abraxane 01/31/2019  Cycle 10 gemcitabine/Abraxane 02/13/2019  Cycle 11 gemcitabine/Abraxane 02/28/2019 (Abraxane held secondary to neuropathy symptoms)  Cycle 12 gemcitabine/Abraxane 03/14/2019 (Abraxane held due to persistent neuropathy symptoms)  Cycle 13 gemcitabine/Abraxane 03/28/2019 (Abraxane held due to persistent neuropathy symptoms)  Cycle 14 gemcitabine/Abraxane 04/11/2019 (Abraxane held due to persistent neuropathy symptoms)  CT abdomen/pelvis 04/28/2019-decrease in size of pancreatic mass. Decrease in size of hepatic metastatic foci.   Cycle 15 gemcitabine12/05/2018(Abraxane held due to neuropathy  symptoms)  Cycle 16 gemcitabine 05/16/2019 (Abraxane held due to neuropathy symptoms)  Cycle 17 gemcitabine 05/31/2019 (Abraxane on hold due to neuropathy symptoms)  Cycle 18 gemcitabine 06/13/2019 (Abraxane  held)  Cycle 19 gemcitabine 06/27/2019 (Abraxane held)  Cycle 20 gemcitabine 07/11/2019 (Abraxane held)  Cycle 21 gemcitabine 07/25/2019 (Abraxane held)  6.Port-A-Cath placement by Dr. Connor4/24/2020   Disposition: Alex [name] appears stable.  There is no clinical evidence of disease progression.  Plan to proceed with gemcitabine today as scheduled.  He will undergo restaging CTs on 08/05/2019.  We reviewed the CBC from today.  Counts adequate to proceed with treatment.  He will return for lab, follow-up, gemcitabine in 2 weeks.  He will contact the office in the interim with any problems.    Ned Card ANP/GNP-BC   07/25/2019  11:56 AM

## 2019-07-26 LAB — CANCER ANTIGEN 19-9: CA 19-9: 32 U/mL (ref 0–35)

## 2019-07-28 ENCOUNTER — Telehealth: Payer: Self-pay | Admitting: Oncology

## 2019-07-28 NOTE — Telephone Encounter (Signed)
Scheduled per los. Called and left msg. Mailed printout  °

## 2019-08-03 ENCOUNTER — Other Ambulatory Visit: Payer: Self-pay | Admitting: Oncology

## 2019-08-05 ENCOUNTER — Ambulatory Visit (HOSPITAL_COMMUNITY)
Admission: RE | Admit: 2019-08-05 | Discharge: 2019-08-05 | Disposition: A | Discharge status: Home / Self Care | Payer: Self-pay | Source: Ambulatory Visit | Attending: Oncology | Admitting: Oncology

## 2019-08-05 ENCOUNTER — Other Ambulatory Visit: Payer: Self-pay

## 2019-08-05 DIAGNOSIS — C252 Malignant neoplasm of tail of pancreas: Secondary | ICD-10-CM | POA: Insufficient documentation

## 2019-08-05 MED ORDER — IOHEXOL 300 MG/ML  SOLN
100.0000 mL | Freq: Once | INTRAMUSCULAR | Status: AC | PRN
  Administered 2019-08-05: 100 mL via INTRAVENOUS

## 2019-08-05 MED ORDER — SODIUM CHLORIDE (PF) 0.9 % IJ SOLN
INTRAMUSCULAR | Status: AC
  Filled 2019-08-05: qty 50

## 2019-08-05 MED ORDER — HEPARIN SOD (PORK) LOCK FLUSH 100 UNIT/ML IV SOLN
INTRAVENOUS | Status: AC
  Filled 2019-08-05: qty 5

## 2019-08-05 MED ORDER — HEPARIN SOD (PORK) LOCK FLUSH 100 UNIT/ML IV SOLN
500.0000 [IU] | Freq: Once | INTRAVENOUS | Status: AC
  Administered 2019-08-05: 500 [IU] via INTRAVENOUS

## 2019-08-08 ENCOUNTER — Other Ambulatory Visit: Payer: Self-pay

## 2019-08-08 ENCOUNTER — Inpatient Hospital Stay: Payer: Self-pay

## 2019-08-08 ENCOUNTER — Inpatient Hospital Stay: Payer: Self-pay | Attending: Nurse Practitioner | Admitting: Oncology

## 2019-08-08 ENCOUNTER — Other Ambulatory Visit: Payer: Self-pay | Admitting: *Deleted

## 2019-08-08 DIAGNOSIS — C19 Malignant neoplasm of rectosigmoid junction: Secondary | ICD-10-CM | POA: Insufficient documentation

## 2019-08-08 DIAGNOSIS — C252 Malignant neoplasm of tail of pancreas: Secondary | ICD-10-CM

## 2019-08-08 DIAGNOSIS — C787 Secondary malignant neoplasm of liver and intrahepatic bile duct: Secondary | ICD-10-CM | POA: Insufficient documentation

## 2019-08-08 DIAGNOSIS — C7889 Secondary malignant neoplasm of other digestive organs: Secondary | ICD-10-CM | POA: Insufficient documentation

## 2019-08-08 DIAGNOSIS — Z5111 Encounter for antineoplastic chemotherapy: Secondary | ICD-10-CM | POA: Insufficient documentation

## 2019-08-08 LAB — CBC WITH DIFFERENTIAL (CANCER CENTER ONLY)
Abs Immature Granulocytes: 0.02 10*3/uL (ref 0.00–0.07)
Basophils Absolute: 0.1 10*3/uL (ref 0.0–0.1)
Basophils Relative: 1 %
Eosinophils Absolute: 0.3 10*3/uL (ref 0.0–0.5)
Eosinophils Relative: 4 %
HCT: 46.5 % (ref 39.0–52.0)
Hemoglobin: 14.7 g/dL (ref 13.0–17.0)
Immature Granulocytes: 0 %
Lymphocytes Relative: 18 %
Lymphs Abs: 1.3 10*3/uL (ref 0.7–4.0)
MCH: 28.7 pg (ref 26.0–34.0)
MCHC: 31.6 g/dL (ref 30.0–36.0)
MCV: 90.6 fL (ref 80.0–100.0)
Monocytes Absolute: 0.9 10*3/uL (ref 0.1–1.0)
Monocytes Relative: 13 %
Neutro Abs: 4.5 10*3/uL (ref 1.7–7.7)
Neutrophils Relative %: 64 %
Platelet Count: 128 10*3/uL — ABNORMAL LOW (ref 150–400)
RBC: 5.13 MIL/uL (ref 4.22–5.81)
RDW: 14.1 % (ref 11.5–15.5)
WBC Count: 7.1 10*3/uL (ref 4.0–10.5)
nRBC: 0 % (ref 0.0–0.2)

## 2019-08-08 LAB — CMP (CANCER CENTER ONLY)
ALT: 31 U/L (ref 0–44)
AST: 41 U/L (ref 15–41)
Albumin: 3.6 g/dL (ref 3.5–5.0)
Alkaline Phosphatase: 61 U/L (ref 38–126)
Anion gap: 10 (ref 5–15)
BUN: 12 mg/dL (ref 8–23)
CO2: 27 mmol/L (ref 22–32)
Calcium: 8.7 mg/dL — ABNORMAL LOW (ref 8.9–10.3)
Chloride: 102 mmol/L (ref 98–111)
Creatinine: 0.94 mg/dL (ref 0.61–1.24)
GFR, Est AFR Am: >60 mL/min (ref >60)
GFR, Estimated: >60 mL/min (ref >60)
Glucose, Bld: 99 mg/dL (ref 70–99)
Potassium: 4 mmol/L (ref 3.5–5.1)
Sodium: 139 mmol/L (ref 135–145)
Total Bilirubin: 0.6 mg/dL (ref 0.3–1.2)
Total Protein: 6.7 g/dL (ref 6.5–8.1)

## 2019-08-08 MED ORDER — PROCHLORPERAZINE MALEATE 10 MG PO TABS
ORAL_TABLET | ORAL | Status: AC
  Filled 2019-08-08: qty 1

## 2019-08-08 MED ORDER — GABAPENTIN 100 MG PO CAPS: 100.0000 mg | ORAL_CAPSULE | Freq: Every day | ORAL | 2 refills | Status: DC

## 2019-08-08 MED ORDER — HEPARIN SOD (PORK) LOCK FLUSH 100 UNIT/ML IV SOLN
500.0000 [IU] | Freq: Once | INTRAVENOUS | Status: AC | PRN
  Administered 2019-08-08: 11:00:00 500 [IU]
  Filled 2019-08-08: qty 5

## 2019-08-08 MED ORDER — PROCHLORPERAZINE MALEATE 10 MG PO TABS: 10.0000 mg | ORAL_TABLET | Freq: Four times a day (QID) | ORAL | 2 refills | Status: DC | PRN

## 2019-08-08 MED ORDER — PROCHLORPERAZINE MALEATE 10 MG PO TABS
10.0000 mg | ORAL_TABLET | Freq: Once | ORAL | Status: AC
  Administered 2019-08-08: 10:00:00 10 mg via ORAL

## 2019-08-08 MED ORDER — SODIUM CHLORIDE 0.9 % IV SOLN
Freq: Once | INTRAVENOUS | Status: AC
  Filled 2019-08-08: qty 250

## 2019-08-08 MED ORDER — SODIUM CHLORIDE 0.9 % IV SOLN
2000.0000 mg | Freq: Once | INTRAVENOUS | Status: AC
  Administered 2019-08-08: 2000 mg via INTRAVENOUS
  Filled 2019-08-08: qty 52.6

## 2019-08-08 MED ORDER — SODIUM CHLORIDE 0.9% FLUSH
10.0000 mL | INTRAVENOUS | Status: DC | PRN
  Administered 2019-08-08: 10 mL
  Filled 2019-08-08: qty 10

## 2019-08-08 NOTE — Patient Instructions (Signed)
[facility] Discharge Instructions for Patients Receiving Chemotherapy  Today you received the following chemotherapy agent: Gemcitabine (Gemzar)  To help prevent nausea and vomiting after your treatment, we encourage you to take your nausea medication as directed by your MD.   If you develop nausea and vomiting that is not controlled by your nausea medication, call the clinic.   BELOW ARE SYMPTOMS THAT SHOULD BE REPORTED IMMEDIATELY:  *FEVER GREATER THAN 100.5 F  *CHILLS WITH OR WITHOUT FEVER  NAUSEA AND VOMITING THAT IS NOT CONTROLLED WITH YOUR NAUSEA MEDICATION  *UNUSUAL SHORTNESS OF BREATH  *UNUSUAL BRUISING OR BLEEDING  TENDERNESS IN MOUTH AND THROAT WITH OR WITHOUT PRESENCE OF ULCERS  *URINARY PROBLEMS  *BOWEL PROBLEMS  UNUSUAL RASH Items with * indicate a potential emergency and should be followed up as soon as possible.  Feel free to call the clinic should you have any questions or concerns. The clinic phone number is [phone].  Please show the [facility] at check-in to the Emergency Department and triage nurse.  Coronavirus (COVID-19) Are you at risk?  Are you at risk for the Coronavirus (COVID-19)?  To be considered HIGH RISK for Coronavirus (COVID-19), you have to meet the following criteria:  . Traveled to [place], [place], [place], [place] or [place]; or in the [place] to [place], [place], [place], or [place]; and have fever, cough, and shortness of breath within the last 2 weeks of travel OR . Been in close contact with a person diagnosed with COVID-19 within the last 2 weeks and have fever, cough, and shortness of breath . IF YOU DO NOT MEET THESE CRITERIA, YOU ARE CONSIDERED LOW RISK FOR COVID-19.  What to do if you are HIGH RISK for COVID-19?  Marland Kitchen If you are having a medical emergency, call 911. . Seek medical care right away. Before you go to a doctor's office, urgent care or emergency department, call ahead and tell  them about your recent travel, contact with someone diagnosed with COVID-19, and your symptoms. You should receive instructions from your physician's office regarding next steps of care.  . When you arrive at healthcare provider, tell the healthcare staff immediately you have returned from visiting [place], [place], [place], [place] or [place]; or traveled in the [place] to [place], [place], [place], or [place]; in the last two weeks or you have been in close contact with a person diagnosed with COVID-19 in the last 2 weeks.   . Tell the health care staff about your symptoms: fever, cough and shortness of breath. . After you have been seen by a medical provider, you will be either: o Tested for (COVID-19) and discharged home on quarantine except to seek medical care if symptoms worsen, and asked to  - Stay home and avoid contact with others until you get your results (4-5 days)  - Avoid travel on public transportation if possible (such as bus, train, or airplane) or o Sent to the Emergency Department by EMS for evaluation, COVID-19 testing, and possible admission depending on your condition and test results.  What to do if you are LOW RISK for COVID-19?  Reduce your risk of any infection by using the same precautions used for avoiding the common cold or flu:  Marland Kitchen Wash your hands often with soap and warm water for at least 20 seconds.  If soap and water are not readily available, use an alcohol-based hand sanitizer with at least 60% alcohol.  Marland Kitchen  If coughing or sneezing, cover your mouth and nose by coughing or sneezing into the elbow areas of your shirt or coat, into a tissue or into your sleeve (not your hands). . Avoid shaking hands with others and consider head nods or verbal greetings only. . Avoid touching your eyes, nose, or mouth with unwashed hands.  . Avoid close contact with people who are sick. . Avoid places or events with large numbers of people in one location, like concerts or  sporting events. . Carefully consider travel plans you have or are making. . If you are planning any travel outside or inside the [place], visit the CDC's Travelers' Health webpage for the latest health notices. . If you have some symptoms but not all symptoms, continue to monitor at home and seek medical attention if your symptoms worsen. . If you are having a medical emergency, call 911.   [facility] / e-Visit: [email or web address]         [facility]: [facility]: [phone]                   [facility]: [phone]

## 2019-08-08 NOTE — Progress Notes (Signed)
[facility] OFFICE PROGRESS NOTE   Diagnosis: Pancreas cancer  INTERVAL HISTORY:   Alex [name] completed another treatment with gemcitabine on 07/25/2019.  He feels well.  Good appetite.  No pain.  He developed an area of erythema superior to the Port-A-Cath after the CT earlier this week.  No fever.  He has intermittent tingling in the feet.  This wakes him up at night.  The numbness in the hands and feet has not changed.  Objective:  Vital signs in last 24 hours:  Blood pressure 137/81, pulse 60, temperature 98.2 F (36.8 C), temperature source Temporal, resp. rate 17, height 5' 6" (1.676 m), weight 213 lb 14.4 oz (97 kg), SpO2 96 %.    Lymphatics: No cervical, supraclavicular, axillary, or inguinal nodes GI: No hepatosplenomegaly, left lower quadrant colostomy, nontender, no mass Vascular: No leg edema     Portacath/PICC-1 cm erythematous area with superficial ulceration superior to the Port-A-Cath, subcutaneous tract without erythema or tenderness  Lab Results:  Lab Results  Component Value Date   WBC 7.1 08/08/2019   HGB 14.7 08/08/2019   HCT 46.5 08/08/2019   MCV 90.6 08/08/2019   PLT 128 (L) 08/08/2019   NEUTROABS 4.5 08/08/2019    CMP  Lab Results  Component Value Date   NA 139 08/08/2019   K 4.0 08/08/2019   CL 102 08/08/2019   CO2 27 08/08/2019   GLUCOSE 99 08/08/2019   BUN 12 08/08/2019   CREATININE 0.94 08/08/2019   CALCIUM 8.7 (L) 08/08/2019   PROT 6.7 08/08/2019   ALBUMIN 3.6 08/08/2019   AST 41 08/08/2019   ALT 31 08/08/2019   ALKPHOS 61 08/08/2019   BILITOT 0.6 08/08/2019   GFRNONAA >60 08/08/2019   GFRAA >60 08/08/2019     Imaging:  CT ABDOMEN PELVIS W CONTRAST  Result Date: 08/05/2019 CLINICAL DATA:  Restaging pancreatic cancer EXAM: CT ABDOMEN AND PELVIS WITH CONTRAST TECHNIQUE: Multidetector CT imaging of the abdomen and pelvis was performed using the standard protocol following bolus administration of intravenous  contrast. CONTRAST:  111m OMNIPAQUE IOHEXOL 300 MG/ML  SOLN COMPARISON:  04/28/2019 FINDINGS: Lower chest: Signs of right basilar pleural scarring with similar appearance, associated with small right-sided pleural effusion unchanged from previous study. Signs of extensive coronary atherosclerotic changes with calcification. Hepatobiliary: Signs of background hepatic steatosis, at least moderate and unchanged. Very small peripheral areas of low attenuation in the right hepatic lobe are unchanged, difficult to visualize given background low hepatic attenuation (Image 34, series 7) 6 mm area is similar to the prior study. (Image 32, series 7) 6 mm area in the posterior right hepatic lobe also unchanged. No new, suspicious hepatic abnormality. Pancreas: Pancreas with area of low attenuation in the pancreatic tail encasing splenic vasculature. Low attenuation measuring approximately 2.8 x 1.7 cm is within 1-2 mm of previous measurements. No new focal, suspicious pancreatic lesion. Spleen: Spleen is normal size without focal lesion. Adrenals/Urinary Tract: Adrenal glands are normal. Signs of left lower pole renal scarring similar to the previous study. No signs of hydronephrosis. Stomach/Bowel: No signs of acute gastrointestinal process. The appendix is normal. Positive enteric contrast extends through the colon. Changes of left lower quadrant colostomy and rectal pouch similar to previous exam. Vascular/Lymphatic: Encasement of splenic vascular structures is similar to the prior study. Gastroepiploic collaterals as result are unchanged. Calcified atherosclerotic changes throughout the abdominal aorta, no signs of aneurysm. No adenopathy in the retroperitoneum or upper abdomen. No signs of pelvic lymphadenopathy. Reproductive: Heterogeneous  prostate, nonspecific finding on CT. Other: Left lower quadrant colostomy. Peristomal herniation of a small amount of fat with similar appearance. Engorged pericolonic vessels in the  setting of splenic venous occlusion unchanged from previous study. Small fat containing right inguinal hernia. Musculoskeletal: Spinal degenerative changes without acute or destructive bone process. IMPRESSION: 1. No evidence of new or progressive metastatic disease. 2. Signs of background hepatic steatosis this limits assessment of hepatic parenchyma for any new lesions. 3. Stable appearance of small peripheral areas of low attenuation in the right hepatic lobe. 4. Stable appearance of left lower quadrant colostomy and rectal pouch with parastomal herniation of a small amount of fat. 5. Stable appearance of right basilar pleural scarring and small right-sided pleural effusion. Aortic Atherosclerosis (ICD10-I70.0). Electronically Signed   By: Zetta Bills M.D.   On: 08/05/2019 12:53    Medications: I have reviewed the patient's current medications.   Assessment/Plan:  1. 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 Well-differentiated adenocarcinoma, 0/13 lymph nodes positive, MSI-stable, no loss of mismatch repair protein expression ? Tumor involving the rectosigmoid junction ? Sigmoidoscopy 09/18/2017-distal sigmoid colon tumor beginning between 15 and 20 cm from the dentate line, completely obstructing ? CT abdomen/pelvis 09/14/2017-sigmoid thickening no evidence of metastatic disease ? 2 mm right upper lobe nodule on CT 08/04/2017 ? Colonoscopy 02/07/2018-polyps removed from the cecum, ascending colon, transverse colon, descending colon, rectosigmoid colon and rectum (tubular adenomas)  2. Right lung pneumonia/empyema March 2019  Right VATS, drainage of empyema, and decortication of the right lower lobe 08/31/2017  3.CVA in 2010  4.Rectal and active sigmoid polyps noted on flexible sigmoidoscopy 09/18/2017  5.Pancreas tail mass/right liver lesions and 4 mm splenic lesion on CT abdomen/pelvis  05/27/2018  MRI abdomen 07/27/2018-right liver lesions, rim-enhancing lesion in the pancreas tail  Ultrasound-guided biopsy of a right liver lesion 08/14/2018-metastatic adenocarcinoma, cytokeratin 7+, CDX-2 weak positive, cytokeratin 20 negative, consistent with metastatic pancreas cancer; MSI stable; tumor mutational burden 0  CT abdomen/pelvis 09/26/2018-increased size of previously noted liver lesions, new right liver lesion, 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  Cycle 6 gemcitabine/Abraxane 12/20/2018  Cycle 7 gemcitabine/Abraxane 01/03/2019  Cycle 8 gemcitabine/Abraxane 01/17/2019  Cycle 9 gemcitabine/Abraxane 01/31/2019  Cycle 10 gemcitabine/Abraxane 02/13/2019  Cycle 11 gemcitabine/Abraxane 02/28/2019 (Abraxane held secondary to neuropathy symptoms)  Cycle 12 gemcitabine/Abraxane 03/14/2019 (Abraxane held due to persistent neuropathy symptoms)  Cycle 13 gemcitabine/Abraxane 03/28/2019 (Abraxane held due to persistent neuropathy symptoms)  Cycle 14 gemcitabine/Abraxane 04/11/2019 (Abraxane held due to persistent neuropathy symptoms)  CT abdomen/pelvis 04/28/2019-decrease in size of pancreatic mass. Decrease in size of hepatic metastatic foci.   Cycle 15 gemcitabine12/05/2018(Abraxane held due to neuropathy symptoms)  Cycle 16 gemcitabine 05/16/2019 (Abraxane held due to neuropathy symptoms)  Cycle 17 gemcitabine 05/31/2019 (Abraxane on hold due to neuropathy symptoms)  Cycle 18 gemcitabine 06/13/2019 (Abraxane held)  Cycle 19 gemcitabine 06/27/2019 (Abraxane held)  Cycle 20 gemcitabine 07/11/2019 (Abraxane  held)  Cycle 21 gemcitabine 07/25/2019 (Abraxane held)  CT abdomen/pelvis 08/05/2019-stable small liver lesions, stable pancreas mass, no evidence of disease progression  Cycle 22 gemcitabine 08/08/2019 (Abraxane held)  6.Port-A-Cath placement by Dr. 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 Unangst appears stable.  The restaging CTs reveal no evidence of disease progression.  The CA 19-9 has been slightly higher over the past few months and remains in the normal range.  I reviewed the CT images with Mr. Poitra and his sister.  The plan is to continue gemcitabine.  He has neuropathy secondary to Abraxane.  The neuropathy symptoms are waking him up at night.  He will try nighttime gabapentin.  Mr. Fralix will return for an office visit and chemotherapy in 2 weeks.  He appears to have a tape reaction at the Port-A-Cath site.  He will call for increased erythema or fever.  Betsy Coder, MD  08/08/2019  9:08 AM

## 2019-08-09 LAB — CANCER ANTIGEN 19-9: CA 19-9: 37 U/mL — ABNORMAL HIGH (ref 0–35)

## 2019-08-11 ENCOUNTER — Telehealth: Payer: Self-pay | Admitting: Oncology

## 2019-08-11 NOTE — Telephone Encounter (Signed)
Scheduled appt per 3/12 los.  Patient will get an updated calendar at their next scheduled appt.

## 2019-08-17 ENCOUNTER — Other Ambulatory Visit: Payer: Self-pay | Admitting: Oncology

## 2019-08-18 NOTE — Progress Notes (Signed)
Pharmacist Chemotherapy Monitoring - Follow Up Assessment    I verify that I have reviewed each item in the below checklist:  . Regimen for the patient is scheduled for the appropriate day and plan matches scheduled date. Marland Kitchen Appropriate non-routine labs are ordered dependent on drug ordered. . If applicable, additional medications reviewed and ordered per protocol based on lifetime cumulative doses and/or treatment regimen.   Plan for follow-up and/or issues identified: No . I-vent associated with next due treatment: No . MD and/or nursing notified: No  Alex [name] 08/18/2019 8:32 AM

## 2019-08-22 ENCOUNTER — Encounter: Payer: Self-pay | Admitting: Oncology

## 2019-08-22 ENCOUNTER — Other Ambulatory Visit: Payer: Self-pay

## 2019-08-22 ENCOUNTER — Inpatient Hospital Stay: Payer: Self-pay

## 2019-08-22 ENCOUNTER — Inpatient Hospital Stay (HOSPITAL_BASED_OUTPATIENT_CLINIC_OR_DEPARTMENT_OTHER): Payer: Self-pay | Admitting: Nurse Practitioner

## 2019-08-22 VITALS — BP 134/73 | HR 65 | Temp 98.7°F | Resp 17 | Ht 66.0 in | Wt 214.9 lb

## 2019-08-22 DIAGNOSIS — C252 Malignant neoplasm of tail of pancreas: Secondary | ICD-10-CM

## 2019-08-22 DIAGNOSIS — Z95828 Presence of other vascular implants and grafts: Secondary | ICD-10-CM

## 2019-08-22 LAB — CMP (CANCER CENTER ONLY)
ALT: 30 U/L (ref 0–44)
AST: 38 U/L (ref 15–41)
Albumin: 3.3 g/dL — ABNORMAL LOW (ref 3.5–5.0)
Alkaline Phosphatase: 59 U/L (ref 38–126)
Anion gap: 7 (ref 5–15)
BUN: 11 mg/dL (ref 8–23)
CO2: 29 mmol/L (ref 22–32)
Calcium: 8.5 mg/dL — ABNORMAL LOW (ref 8.9–10.3)
Chloride: 104 mmol/L (ref 98–111)
Creatinine: 0.95 mg/dL (ref 0.61–1.24)
GFR, Est AFR Am: >60 mL/min (ref >60)
GFR, Estimated: >60 mL/min (ref >60)
Glucose, Bld: 159 mg/dL — ABNORMAL HIGH (ref 70–99)
Potassium: 3.7 mmol/L (ref 3.5–5.1)
Sodium: 140 mmol/L (ref 135–145)
Total Bilirubin: 0.5 mg/dL (ref 0.3–1.2)
Total Protein: 6.5 g/dL (ref 6.5–8.1)

## 2019-08-22 LAB — CBC WITH DIFFERENTIAL (CANCER CENTER ONLY)
Abs Immature Granulocytes: 0.01 10*3/uL (ref 0.00–0.07)
Basophils Absolute: 0.1 10*3/uL (ref 0.0–0.1)
Basophils Relative: 1 %
Eosinophils Absolute: 0.3 10*3/uL (ref 0.0–0.5)
Eosinophils Relative: 5 %
HCT: 44.1 % (ref 39.0–52.0)
Hemoglobin: 14 g/dL (ref 13.0–17.0)
Immature Granulocytes: 0 %
Lymphocytes Relative: 17 %
Lymphs Abs: 1 10*3/uL (ref 0.7–4.0)
MCH: 29.9 pg (ref 26.0–34.0)
MCHC: 31.7 g/dL (ref 30.0–36.0)
MCV: 94 fL (ref 80.0–100.0)
Monocytes Absolute: 0.8 10*3/uL (ref 0.1–1.0)
Monocytes Relative: 14 %
Neutro Abs: 3.7 10*3/uL (ref 1.7–7.7)
Neutrophils Relative %: 63 %
Platelet Count: 120 10*3/uL — ABNORMAL LOW (ref 150–400)
RBC: 4.69 MIL/uL (ref 4.22–5.81)
RDW: 14.6 % (ref 11.5–15.5)
WBC Count: 5.8 10*3/uL (ref 4.0–10.5)
nRBC: 0 % (ref 0.0–0.2)

## 2019-08-22 MED ORDER — SODIUM CHLORIDE 0.9 % IV SOLN
2000.0000 mg | Freq: Once | INTRAVENOUS | Status: AC
  Administered 2019-08-22: 2000 mg via INTRAVENOUS
  Filled 2019-08-22: qty 52.6

## 2019-08-22 MED ORDER — SODIUM CHLORIDE 0.9% FLUSH
10.0000 mL | INTRAVENOUS | Status: DC | PRN
  Filled 2019-08-22: qty 10

## 2019-08-22 MED ORDER — PROCHLORPERAZINE MALEATE 10 MG PO TABS
ORAL_TABLET | ORAL | Status: AC
  Filled 2019-08-22: qty 1

## 2019-08-22 MED ORDER — PROCHLORPERAZINE MALEATE 10 MG PO TABS
10.0000 mg | ORAL_TABLET | Freq: Once | ORAL | Status: AC
  Administered 2019-08-22: 10 mg via ORAL

## 2019-08-22 MED ORDER — SODIUM CHLORIDE 0.9% FLUSH
10.0000 mL | INTRAVENOUS | Status: DC | PRN
  Administered 2019-08-22: 10 mL
  Filled 2019-08-22: qty 10

## 2019-08-22 MED ORDER — HEPARIN SOD (PORK) LOCK FLUSH 100 UNIT/ML IV SOLN
500.0000 [IU] | Freq: Once | INTRAVENOUS | Status: AC | PRN
  Administered 2019-08-22: 500 [IU]
  Filled 2019-08-22: qty 5

## 2019-08-22 MED ORDER — SODIUM CHLORIDE 0.9 % IV SOLN
Freq: Once | INTRAVENOUS | Status: AC
  Filled 2019-08-22: qty 250

## 2019-08-22 NOTE — Patient Instructions (Signed)
[facility] Discharge Instructions for Patients Receiving Chemotherapy  Today you received the following chemotherapy agents: gemcitabine.  To help prevent nausea and vomiting after your treatment, we encourage you to take your nausea medication as directed.   If you develop nausea and vomiting that is not controlled by your nausea medication, call the clinic.   BELOW ARE SYMPTOMS THAT SHOULD BE REPORTED IMMEDIATELY:  *FEVER GREATER THAN 100.5 F  *CHILLS WITH OR WITHOUT FEVER  NAUSEA AND VOMITING THAT IS NOT CONTROLLED WITH YOUR NAUSEA MEDICATION  *UNUSUAL SHORTNESS OF BREATH  *UNUSUAL BRUISING OR BLEEDING  TENDERNESS IN MOUTH AND THROAT WITH OR WITHOUT PRESENCE OF ULCERS  *URINARY PROBLEMS  *BOWEL PROBLEMS  UNUSUAL RASH Items with * indicate a potential emergency and should be followed up as soon as possible.  Feel free to call the clinic should you have any questions or concerns. The clinic phone number is [phone].  Please show the CHEMO ALERT CARD at check-in to the Emergency Department and triage nurse.   

## 2019-08-22 NOTE — Progress Notes (Addendum)
[facility] OFFICE PROGRESS NOTE   Diagnosis: Pancreas cancer  INTERVAL HISTORY:   Alex [name] returns as scheduled.  He completed another cycle of gemcitabine on 08/08/2019.  He denies nausea/vomiting.  No mouth sores.  No diarrhea.  No fever or rash after treatment.  No cough or shortness of breath.  He denies abdominal pain.  The foot pain has improved significantly since beginning gabapentin.  He is now able to sleep at night.  Objective:  Vital signs in last 24 hours:  Blood pressure 134/73, pulse 65, temperature 98.7 F (37.1 C), temperature source Temporal, resp. rate 17, height 5' 6" (1.676 m), weight 214 lb 14.4 oz (97.5 kg), SpO2 97 %.    HEENT: No thrush or ulcers. GI: Abdomen soft and nontender.  No hepatomegaly.  Left lower quadrant colostomy. Vascular: No leg edema. Skin: No rash. Port-A-Cath without erythema.   Lab Results:  Lab Results  Component Value Date   WBC 5.8 08/22/2019   HGB 14.0 08/22/2019   HCT 44.1 08/22/2019   MCV 94.0 08/22/2019   PLT 120 (L) 08/22/2019   NEUTROABS 3.7 08/22/2019    Imaging:  No results found.  Medications: I have reviewed the patient's current medications.  Assessment/Plan: 1. 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 Well-differentiated adenocarcinoma, 0/13 lymph nodes positive, MSI-stable, no loss of mismatch repair protein expression ? Tumor involving the rectosigmoid junction ? Sigmoidoscopy 09/18/2017-distal sigmoid colon tumor beginning between 15 and 20 cm from the dentate line, completely obstructing ? CT abdomen/pelvis 09/14/2017-sigmoid thickening no evidence of metastatic disease ? 2 mm right upper lobe nodule on CT 08/04/2017 ? Colonoscopy 02/07/2018-polyps removed from the cecum, ascending colon, transverse colon, descending colon, rectosigmoid colon and rectum (tubular adenomas)  2. Right lung pneumonia/empyema  March 2019  Right VATS, drainage of empyema, and decortication of the right lower lobe 08/31/2017  3.CVA in 2010  4.Rectal and active sigmoid polyps noted on flexible sigmoidoscopy 09/18/2017  5.Pancreas tail mass/right liver lesions and 4 mm splenic lesion on CT abdomen/pelvis 05/27/2018  MRI abdomen 07/27/2018-right liver lesions, rim-enhancing lesion in the pancreas tail  Ultrasound-guided biopsy of a right liver lesion 08/14/2018-metastatic adenocarcinoma, cytokeratin 7+, CDX-2 weak positive, cytokeratin 20 negative, consistent with metastatic pancreas cancer; MSI stable; tumor mutational burden 0  CT abdomen/pelvis 09/26/2018-increased size of previously noted liver lesions, new right liver lesion, 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  Cycle 6 gemcitabine/Abraxane 12/20/2018  Cycle 7 gemcitabine/Abraxane 01/03/2019  Cycle 8 gemcitabine/Abraxane 01/17/2019  Cycle 9 gemcitabine/Abraxane 01/31/2019  Cycle 10 gemcitabine/Abraxane 02/13/2019  Cycle 11 gemcitabine/Abraxane 02/28/2019 (Abraxane held secondary to neuropathy symptoms)  Cycle 12 gemcitabine/Abraxane 03/14/2019 (Abraxane held due to persistent neuropathy symptoms)  Cycle 13 gemcitabine/Abraxane 03/28/2019 (Abraxane held due to persistent neuropathy symptoms)  Cycle 14 gemcitabine/Abraxane 04/11/2019 (Abraxane held due to persistent neuropathy symptoms)  CT abdomen/pelvis 04/28/2019-decrease in size of pancreatic mass. Decrease in size of hepatic metastatic foci.   Cycle 15 gemcitabine12/05/2018(Abraxane held due to neuropathy  symptoms)  Cycle 16 gemcitabine 05/16/2019 (Abraxane held due to neuropathy symptoms)  Cycle 17 gemcitabine 05/31/2019 (Abraxane on hold due to neuropathy symptoms)  Cycle 18 gemcitabine 06/13/2019 (Abraxane held)    Cycle 19 gemcitabine 06/27/2019 (Abraxane held)  Cycle 20 gemcitabine 07/11/2019 (Abraxane held)  Cycle 21 gemcitabine 07/25/2019 (Abraxane held)  CT abdomen/pelvis 08/05/2019-stable small liver lesions, stable pancreas mass, no evidence of disease progression  Cycle 22 gemcitabine 08/08/2019 (Abraxane held)  Cycle 23 gemcitabine 08/22/2019 (Abraxane held)  6.Port-A-Cath placement by Dr. 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  He continues every 2-week gemcitabine.  There is no clinical evidence of disease progression.  We discussed the slight increase in the CA 19-9 on labs 2 weeks ago.  We will continue to monitor.  Plan to continue every 2-week gemcitabine for now, cycle 22 today.  We reviewed the CBC from today.  Counts are adequate to proceed with gemcitabine.  He has mild stable thrombocytopenia.  He understands to contact the office with any bleeding.  He will return for lab, follow-up, gemcitabine in 2 weeks.  He will contact the office in the interim as outlined above or with any other problems.  Patient seen with Dr. Benay Spice.    Ned Card ANP/GNP-BC   08/22/2019  10:51 AM This was a shared visit with Ned Card.  He continues to tolerate chemotherapy well.  Neuropathy symptoms have improved with gabapentin.  The plan is to continue every 2-week gemcitabine.  The CA 19-9 is mildly elevated.  We will consider adding Abraxane back to the chemotherapy regimen or changing to a different treatment if the CA 19-9 continues to rise.  Julieanne Manson, MD

## 2019-08-23 LAB — CANCER ANTIGEN 19-9: CA 19-9: 46 U/mL — ABNORMAL HIGH (ref 0–35)

## 2019-08-31 ENCOUNTER — Other Ambulatory Visit: Payer: Self-pay | Admitting: Oncology

## 2019-09-01 NOTE — Progress Notes (Signed)
Pharmacist Chemotherapy Monitoring - Follow Up Assessment    I verify that I have reviewed each item in the below checklist:  . Regimen for the patient is scheduled for the appropriate day and plan matches scheduled date. Marland Kitchen Appropriate non-routine labs are ordered dependent on drug ordered. . If applicable, additional medications reviewed and ordered per protocol based on lifetime cumulative doses and/or treatment regimen.   Plan for follow-up and/or issues identified: No . I-vent associated with next due treatment: No . MD and/or nursing notified: No  Alex [name] D 09/01/2019 2:30 PM

## 2019-09-05 ENCOUNTER — Inpatient Hospital Stay (HOSPITAL_BASED_OUTPATIENT_CLINIC_OR_DEPARTMENT_OTHER): Payer: Self-pay | Admitting: Oncology

## 2019-09-05 ENCOUNTER — Inpatient Hospital Stay: Payer: Self-pay

## 2019-09-05 ENCOUNTER — Inpatient Hospital Stay: Payer: Self-pay | Attending: Nurse Practitioner

## 2019-09-05 ENCOUNTER — Other Ambulatory Visit: Payer: Self-pay

## 2019-09-05 VITALS — BP 122/77 | HR 65 | Temp 98.7°F | Resp 18 | Ht 66.0 in | Wt 219.3 lb

## 2019-09-05 DIAGNOSIS — C252 Malignant neoplasm of tail of pancreas: Secondary | ICD-10-CM

## 2019-09-05 DIAGNOSIS — C787 Secondary malignant neoplasm of liver and intrahepatic bile duct: Secondary | ICD-10-CM | POA: Insufficient documentation

## 2019-09-05 DIAGNOSIS — C19 Malignant neoplasm of rectosigmoid junction: Secondary | ICD-10-CM | POA: Insufficient documentation

## 2019-09-05 DIAGNOSIS — Z5111 Encounter for antineoplastic chemotherapy: Secondary | ICD-10-CM | POA: Insufficient documentation

## 2019-09-05 LAB — CBC WITH DIFFERENTIAL (CANCER CENTER ONLY)
Abs Immature Granulocytes: 0.03 10*3/uL (ref 0.00–0.07)
Basophils Absolute: 0 10*3/uL (ref 0.0–0.1)
Basophils Relative: 1 %
Eosinophils Absolute: 0.3 10*3/uL (ref 0.0–0.5)
Eosinophils Relative: 4 %
HCT: 45.1 % (ref 39.0–52.0)
Hemoglobin: 14.3 g/dL (ref 13.0–17.0)
Immature Granulocytes: 1 %
Lymphocytes Relative: 16 %
Lymphs Abs: 1.1 10*3/uL (ref 0.7–4.0)
MCH: 29.9 pg (ref 26.0–34.0)
MCHC: 31.7 g/dL (ref 30.0–36.0)
MCV: 94.2 fL (ref 80.0–100.0)
Monocytes Absolute: 1 10*3/uL (ref 0.1–1.0)
Monocytes Relative: 15 %
Neutro Abs: 4.2 10*3/uL (ref 1.7–7.7)
Neutrophils Relative %: 63 %
Platelet Count: 127 10*3/uL — ABNORMAL LOW (ref 150–400)
RBC: 4.79 MIL/uL (ref 4.22–5.81)
RDW: 14.6 % (ref 11.5–15.5)
WBC Count: 6.5 10*3/uL (ref 4.0–10.5)
nRBC: 0 % (ref 0.0–0.2)

## 2019-09-05 LAB — CMP (CANCER CENTER ONLY)
ALT: 25 U/L (ref 0–44)
AST: 38 U/L (ref 15–41)
Albumin: 3.5 g/dL (ref 3.5–5.0)
Alkaline Phosphatase: 61 U/L (ref 38–126)
Anion gap: 7 (ref 5–15)
BUN: 14 mg/dL (ref 8–23)
CO2: 29 mmol/L (ref 22–32)
Calcium: 8.6 mg/dL — ABNORMAL LOW (ref 8.9–10.3)
Chloride: 104 mmol/L (ref 98–111)
Creatinine: 0.91 mg/dL (ref 0.61–1.24)
GFR, Est AFR Am: >60 mL/min (ref >60)
GFR, Estimated: >60 mL/min (ref >60)
Glucose, Bld: 106 mg/dL — ABNORMAL HIGH (ref 70–99)
Potassium: 3.8 mmol/L (ref 3.5–5.1)
Sodium: 140 mmol/L (ref 135–145)
Total Bilirubin: 0.8 mg/dL (ref 0.3–1.2)
Total Protein: 6.7 g/dL (ref 6.5–8.1)

## 2019-09-05 MED ORDER — PROCHLORPERAZINE MALEATE 10 MG PO TABS
10.0000 mg | ORAL_TABLET | Freq: Once | ORAL | Status: AC
  Administered 2019-09-05: 10 mg via ORAL

## 2019-09-05 MED ORDER — PROCHLORPERAZINE MALEATE 10 MG PO TABS
ORAL_TABLET | ORAL | Status: AC
  Filled 2019-09-05: qty 1

## 2019-09-05 MED ORDER — HEPARIN SOD (PORK) LOCK FLUSH 100 UNIT/ML IV SOLN
500.0000 [IU] | Freq: Once | INTRAVENOUS | Status: AC | PRN
  Administered 2019-09-05: 500 [IU]
  Filled 2019-09-05: qty 5

## 2019-09-05 MED ORDER — SODIUM CHLORIDE 0.9% FLUSH
10.0000 mL | INTRAVENOUS | Status: DC | PRN
  Administered 2019-09-05: 10 mL via INTRAVENOUS
  Filled 2019-09-05: qty 10

## 2019-09-05 MED ORDER — SODIUM CHLORIDE 0.9 % IV SOLN
Freq: Once | INTRAVENOUS | Status: AC
  Filled 2019-09-05: qty 250

## 2019-09-05 MED ORDER — SODIUM CHLORIDE 0.9 % IV SOLN
2000.0000 mg | Freq: Once | INTRAVENOUS | Status: AC
  Administered 2019-09-05: 2000 mg via INTRAVENOUS
  Filled 2019-09-05: qty 52.6

## 2019-09-05 MED ORDER — SODIUM CHLORIDE 0.9% FLUSH
10.0000 mL | INTRAVENOUS | Status: DC | PRN
  Administered 2019-09-05: 10 mL
  Filled 2019-09-05: qty 10

## 2019-09-05 NOTE — Patient Instructions (Signed)
[facility] °Discharge Instructions for Patients Receiving Chemotherapy ° °Today you received the following chemotherapy agents Gemzar ° °To help prevent nausea and vomiting after your treatment, we encourage you to take your nausea medication as directed. °  °If you develop nausea and vomiting that is not controlled by your nausea medication, call the clinic.  ° °BELOW ARE SYMPTOMS THAT SHOULD BE REPORTED IMMEDIATELY: °· *FEVER GREATER THAN 100.5 F °· *CHILLS WITH OR WITHOUT FEVER °· NAUSEA AND VOMITING THAT IS NOT CONTROLLED WITH YOUR NAUSEA MEDICATION °· *UNUSUAL SHORTNESS OF BREATH °· *UNUSUAL BRUISING OR BLEEDING °· TENDERNESS IN MOUTH AND THROAT WITH OR WITHOUT PRESENCE OF ULCERS °· *URINARY PROBLEMS °· *BOWEL PROBLEMS °· UNUSUAL RASH °Items with * indicate a potential emergency and should be followed up as soon as possible. ° °Feel free to call the clinic should you have any questions or concerns. The clinic phone number is [phone]. ° °Please show the CHEMO ALERT CARD at check-in to the Emergency Department and triage nurse. ° ° °

## 2019-09-05 NOTE — Progress Notes (Signed)
[place] OFFICE PROGRESS NOTE   Diagnosis: Pancreas cancer  INTERVAL HISTORY:   [name] completed another treatment with gemcitabine on 08/22/2019.  No rash or fever.  He feels well.  Good appetite.  Tingling in the feet at night has improved with gabapentin.  He continues to have numbness into fingers on the right hand.  Objective:  Vital signs in last 24 hours:  Blood pressure 122/77, pulse 65, temperature 98.7 F (37.1 C), temperature source Temporal, resp. rate 18, height '5\' 6"'  (1.676 m), weight 219 lb 4.8 oz (99.5 kg), SpO2 98 %.    HEENT: Neck without mass Lymphatics: No cervical, supraclavicular, axillary, or inguinal nodes Resp: Decreased breath sounds with inspiratory rales at the right lower posterior chest, no respiratory distress Cardio: Regular rate and rhythm GI: No hepatosplenomegaly, left lower quadrant colostomy with brown stool, nontender, no mass Vascular: Trace pitting edema at the low pretibial area/ankle bilaterally    Portacath/PICC-without erythema  Lab Results:  Lab Results  Component Value Date   WBC 6.5 09/05/2019   HGB 14.3 09/05/2019   HCT 45.1 09/05/2019   MCV 94.2 09/05/2019   PLT 127 (L) 09/05/2019   NEUTROABS 4.2 09/05/2019    CMP  Lab Results  Component Value Date   NA 140 09/05/2019   K 3.8 09/05/2019   CL 104 09/05/2019   CO2 29 09/05/2019   GLUCOSE 106 (H) 09/05/2019   BUN 14 09/05/2019   CREATININE 0.91 09/05/2019   CALCIUM 8.6 (L) 09/05/2019   PROT 6.7 09/05/2019   ALBUMIN 3.5 09/05/2019   AST 38 09/05/2019   ALT 25 09/05/2019   ALKPHOS 61 09/05/2019   BILITOT 0.8 09/05/2019   GFRNONAA >60 09/05/2019   GFRAA >60 09/05/2019    Lab Results  Component Value Date   CEA1 1.19 08/19/2018     Medications: I have reviewed the patient's current medications.   Assessment/Plan: 1. Adenocarcinoma of the rectosigmoid colon,stage 2 (pT3,pN0)status post a partial colectomy and end colostomy  09/20/2017 ? Well-differentiated adenocarcinoma, 0/13 lymph nodes positive, MSI-stable, no loss of mismatch repair protein expression ? Tumor involving the rectosigmoid junction ? Sigmoidoscopy 09/18/2017-distal sigmoid colon tumor beginning between 15 and 20 cm from the dentate line, completely obstructing ? CT abdomen/pelvis 09/14/2017-sigmoid thickening no evidence of metastatic disease ? 2 mm right upper lobe nodule on CT 08/04/2017 ? Colonoscopy 02/07/2018-polyps removed from the cecum, ascending colon, transverse colon, descending colon, rectosigmoid colon and rectum (tubular adenomas)  2. Right lung pneumonia/empyema March 2019  Right VATS, drainage of empyema, and decortication of the right lower lobe 08/31/2017  3.CVA in 2010  4.Rectal and active sigmoid polyps noted on flexible sigmoidoscopy 09/18/2017  5.Pancreas tail mass/right liver lesions and 4 mm splenic lesion on CT abdomen/pelvis 05/27/2018  MRI abdomen 07/27/2018-right liver lesions, rim-enhancing lesion in the pancreas tail  Ultrasound-guided biopsy of a right liver lesion 08/14/2018-metastatic adenocarcinoma, cytokeratin 7+, CDX-2 weak positive, cytokeratin 20 negative, consistent with metastatic pancreas cancer; MSI stable; tumor mutational burden 0  CT abdomen/pelvis 09/26/2018-increased size of previously noted liver lesions, new right liver lesion, 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  Cycle 6 gemcitabine/Abraxane 12/20/2018  Cycle 7 gemcitabine/Abraxane 01/03/2019  Cycle 8 gemcitabine/Abraxane  01/17/2019  Cycle 9  gemcitabine/Abraxane 01/31/2019  Cycle 10 gemcitabine/Abraxane 02/13/2019  Cycle 11 gemcitabine/Abraxane 02/28/2019 (Abraxane held secondary to neuropathy symptoms)  Cycle 12 gemcitabine/Abraxane 03/14/2019 (Abraxane held due to persistent neuropathy symptoms)  Cycle 13 gemcitabine/Abraxane 03/28/2019 (Abraxane held due to persistent neuropathy symptoms)  Cycle 14 gemcitabine/Abraxane 04/11/2019 (Abraxane held due to persistent neuropathy symptoms)  CT abdomen/pelvis 04/28/2019-decrease in size of pancreatic mass. Decrease in size of hepatic metastatic foci.   Cycle 15 gemcitabine12/05/2018(Abraxane held due to neuropathy symptoms)  Cycle 16 gemcitabine 05/16/2019 (Abraxane held due to neuropathy symptoms)  Cycle 17 gemcitabine 05/31/2019 (Abraxane on hold due to neuropathy symptoms)  Cycle 18 gemcitabine 06/13/2019 (Abraxane held)  Cycle 19 gemcitabine 06/27/2019 (Abraxane held)  Cycle 20 gemcitabine 07/11/2019 (Abraxane held)  Cycle 21 gemcitabine 07/25/2019 (Abraxane held)  CT abdomen/pelvis 08/05/2019-stable small liver lesions, stable pancreas mass, no evidence of disease progression  Cycle 22 gemcitabine 08/08/2019 (Abraxane held)  Cycle 23 gemcitabine 08/22/2019 (Abraxane held)  Cycle 24 gemcitabine 09/05/2019 (Abraxane held)  6.Port-A-Cath placement by Dr. 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 Schappell appears unchanged.  No clinical evidence for progression of pancreas cancer.  The CA 19-9 is slowly rising.  The plan is to continue gemcitabine.  He will be scheduled for restaging CTs to include a chest CT after the next cycle of chemotherapy.  Betsy Coder, MD  09/05/2019  10:42 AM

## 2019-09-06 LAB — CANCER ANTIGEN 19-9: CA 19-9: 60 U/mL — ABNORMAL HIGH (ref 0–35)

## 2019-09-08 ENCOUNTER — Telehealth: Payer: Self-pay | Admitting: Oncology

## 2019-09-08 NOTE — Telephone Encounter (Signed)
Scheduled per los. Called and left msg. Mailed printout  °

## 2019-09-14 ENCOUNTER — Other Ambulatory Visit: Payer: Self-pay | Admitting: Oncology

## 2019-09-15 NOTE — Progress Notes (Signed)
Pharmacist Chemotherapy Monitoring - Follow Up Assessment    I verify that I have reviewed each item in the below checklist:  . Regimen for the patient is scheduled for the appropriate day and plan matches scheduled date. Marland Kitchen Appropriate non-routine labs are ordered dependent on drug ordered. . If applicable, additional medications reviewed and ordered per protocol based on lifetime cumulative doses and/or treatment regimen.   Plan for follow-up and/or issues identified: No . I-vent associated with next due treatment: No . MD and/or nursing notified: No  Romualdo Bolk [facility] 09/15/2019 8:21 AM

## 2019-09-19 ENCOUNTER — Telehealth: Payer: Self-pay | Admitting: Nurse Practitioner

## 2019-09-19 ENCOUNTER — Inpatient Hospital Stay: Payer: Self-pay

## 2019-09-19 ENCOUNTER — Inpatient Hospital Stay (HOSPITAL_BASED_OUTPATIENT_CLINIC_OR_DEPARTMENT_OTHER): Payer: Self-pay | Admitting: Nurse Practitioner

## 2019-09-19 ENCOUNTER — Other Ambulatory Visit: Payer: Self-pay

## 2019-09-19 ENCOUNTER — Encounter: Payer: Self-pay | Admitting: Nurse Practitioner

## 2019-09-19 VITALS — BP 161/81 | HR 65 | Temp 98.7°F | Resp 20 | Ht 66.0 in | Wt 216.7 lb

## 2019-09-19 DIAGNOSIS — Z95828 Presence of other vascular implants and grafts: Secondary | ICD-10-CM

## 2019-09-19 DIAGNOSIS — C252 Malignant neoplasm of tail of pancreas: Secondary | ICD-10-CM

## 2019-09-19 LAB — CBC WITH DIFFERENTIAL (CANCER CENTER ONLY)
Abs Immature Granulocytes: 0.01 10*3/uL (ref 0.00–0.07)
Basophils Absolute: 0.1 10*3/uL (ref 0.0–0.1)
Basophils Relative: 1 %
Eosinophils Absolute: 0.2 10*3/uL (ref 0.0–0.5)
Eosinophils Relative: 3 %
HCT: 46 % (ref 39.0–52.0)
Hemoglobin: 14.6 g/dL (ref 13.0–17.0)
Immature Granulocytes: 0 %
Lymphocytes Relative: 16 %
Lymphs Abs: 1.2 10*3/uL (ref 0.7–4.0)
MCH: 29.6 pg (ref 26.0–34.0)
MCHC: 31.7 g/dL (ref 30.0–36.0)
MCV: 93.1 fL (ref 80.0–100.0)
Monocytes Absolute: 1 10*3/uL (ref 0.1–1.0)
Monocytes Relative: 14 %
Neutro Abs: 4.7 10*3/uL (ref 1.7–7.7)
Neutrophils Relative %: 66 %
Platelet Count: 120 10*3/uL — ABNORMAL LOW (ref 150–400)
RBC: 4.94 MIL/uL (ref 4.22–5.81)
RDW: 14.8 % (ref 11.5–15.5)
WBC Count: 7.1 10*3/uL (ref 4.0–10.5)
nRBC: 0 % (ref 0.0–0.2)

## 2019-09-19 LAB — CMP (CANCER CENTER ONLY)
ALT: 33 U/L (ref 0–44)
AST: 46 U/L — ABNORMAL HIGH (ref 15–41)
Albumin: 3.4 g/dL — ABNORMAL LOW (ref 3.5–5.0)
Alkaline Phosphatase: 62 U/L (ref 38–126)
Anion gap: 8 (ref 5–15)
BUN: 12 mg/dL (ref 8–23)
CO2: 27 mmol/L (ref 22–32)
Calcium: 8.8 mg/dL — ABNORMAL LOW (ref 8.9–10.3)
Chloride: 104 mmol/L (ref 98–111)
Creatinine: 0.86 mg/dL (ref 0.61–1.24)
GFR, Est AFR Am: >60 mL/min (ref >60)
GFR, Estimated: >60 mL/min (ref >60)
Glucose, Bld: 95 mg/dL (ref 70–99)
Potassium: 4.3 mmol/L (ref 3.5–5.1)
Sodium: 139 mmol/L (ref 135–145)
Total Bilirubin: 0.6 mg/dL (ref 0.3–1.2)
Total Protein: 6.7 g/dL (ref 6.5–8.1)

## 2019-09-19 MED ORDER — SODIUM CHLORIDE 0.9% FLUSH
10.0000 mL | INTRAVENOUS | Status: DC | PRN
  Administered 2019-09-19: 10 mL
  Filled 2019-09-19: qty 10

## 2019-09-19 MED ORDER — PROCHLORPERAZINE MALEATE 10 MG PO TABS
10.0000 mg | ORAL_TABLET | Freq: Once | ORAL | Status: AC
  Administered 2019-09-19: 11:00:00 10 mg via ORAL

## 2019-09-19 MED ORDER — HEPARIN SOD (PORK) LOCK FLUSH 100 UNIT/ML IV SOLN
500.0000 [IU] | Freq: Once | INTRAVENOUS | Status: AC | PRN
  Administered 2019-09-19: 12:00:00 500 [IU]
  Filled 2019-09-19: qty 5

## 2019-09-19 MED ORDER — SODIUM CHLORIDE 0.9 % IV SOLN
Freq: Once | INTRAVENOUS | Status: AC
  Filled 2019-09-19: qty 250

## 2019-09-19 MED ORDER — SODIUM CHLORIDE 0.9 % IV SOLN
2000.0000 mg | Freq: Once | INTRAVENOUS | Status: AC
  Administered 2019-09-19: 2000 mg via INTRAVENOUS
  Filled 2019-09-19: qty 52.6

## 2019-09-19 MED ORDER — PROCHLORPERAZINE MALEATE 10 MG PO TABS
ORAL_TABLET | ORAL | Status: AC
  Filled 2019-09-19: qty 1

## 2019-09-19 NOTE — Telephone Encounter (Signed)
Scheduled appt per 4/23 los - pt to get an updated schedule when leave tx area.

## 2019-09-19 NOTE — Progress Notes (Signed)
[place] OFFICE PROGRESS NOTE   Diagnosis: Pancreas cancer  INTERVAL HISTORY:   Alex [name] returns as scheduled.  He completed another cycle of gemcitabine 09/05/2019.  He has stable neuropathy symptoms.  No nausea or vomiting.  No diarrhea.  No fever or rash following treatment.  No shortness of breath or cough.  He denies abdominal pain.  He has a good appetite.  Objective:  Vital signs in last 24 hours:  Blood pressure (!) 161/81, pulse 65, temperature 98.7 F (37.1 C), temperature source Temporal, resp. rate 20, height '5\' 6"'  (1.676 m), weight 216 lb 11.2 oz (98.3 kg), SpO2 98 %.    HEENT: No thrush or ulcers. Resp: Breath sounds diminished right lung base with faint rales.  No respiratory distress. Cardio: Regular rate and rhythm. GI: Abdomen soft and nontender.  No hepatomegaly.  Left lower quadrant colostomy. Vascular: Trace edema at the lower legs bilaterally. Port-A-Cath without erythema.  Lab Results:  Lab Results  Component Value Date   WBC 7.1 09/19/2019   HGB 14.6 09/19/2019   HCT 46.0 09/19/2019   MCV 93.1 09/19/2019   PLT 120 (L) 09/19/2019   NEUTROABS 4.7 09/19/2019    Imaging:  No results found.  Medications: I have reviewed the patient's current medications.  Assessment/Plan: 1. 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 Well-differentiated adenocarcinoma, 0/13 lymph nodes positive, MSI-stable, no loss of mismatch repair protein expression ? Tumor involving the rectosigmoid junction ? Sigmoidoscopy 09/18/2017-distal sigmoid colon tumor beginning between 15 and 20 cm from the dentate line, completely obstructing ? CT abdomen/pelvis 09/14/2017-sigmoid thickening no evidence of metastatic disease ? 2 mm right upper lobe nodule on CT 08/04/2017 ? Colonoscopy 02/07/2018-polyps removed from the cecum, ascending colon, transverse colon, descending colon,  rectosigmoid colon and rectum (tubular adenomas)  2. Right lung pneumonia/empyema March 2019  Right VATS, drainage of empyema, and decortication of the right lower lobe 08/31/2017  3.CVA in 2010  4.Rectal and active sigmoid polyps noted on flexible sigmoidoscopy 09/18/2017  5.Pancreas tail mass/right liver lesions and 4 mm splenic lesion on CT abdomen/pelvis 05/27/2018  MRI abdomen 07/27/2018-right liver lesions, rim-enhancing lesion in the pancreas tail  Ultrasound-guided biopsy of a right liver lesion 08/14/2018-metastatic adenocarcinoma, cytokeratin 7+, CDX-2 weak positive, cytokeratin 20 negative, consistent with metastatic pancreas cancer; MSI stable; tumor mutational burden 0  CT abdomen/pelvis 09/26/2018-increased size of previously noted liver lesions, new right liver lesion, 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  Cycle 6 gemcitabine/Abraxane 12/20/2018  Cycle 7 gemcitabine/Abraxane 01/03/2019  Cycle 8 gemcitabine/Abraxane 01/17/2019  Cycle 9 gemcitabine/Abraxane 01/31/2019  Cycle 10 gemcitabine/Abraxane 02/13/2019  Cycle 11 gemcitabine/Abraxane 02/28/2019 (Abraxane held secondary to neuropathy symptoms)  Cycle 12 gemcitabine/Abraxane 03/14/2019 (Abraxane held due to persistent neuropathy symptoms)  Cycle 13 gemcitabine/Abraxane 03/28/2019 (Abraxane held due to persistent neuropathy symptoms)  Cycle 14 gemcitabine/Abraxane 04/11/2019 (Abraxane held due to persistent neuropathy symptoms)  CT abdomen/pelvis 04/28/2019-decrease in size of pancreatic mass. Decrease in size of hepatic metastatic  foci.   Cycle 15 gemcitabine12/05/2018(Abraxane held due to neuropathy symptoms)  Cycle 16 gemcitabine 05/16/2019 (Abraxane held due to neuropathy symptoms)  Cycle 17 gemcitabine 05/31/2019 (Abraxane on hold due to neuropathy  symptoms)  Cycle 18 gemcitabine 06/13/2019 (Abraxane held)  Cycle 19 gemcitabine 06/27/2019 (Abraxane held)  Cycle 20 gemcitabine 07/11/2019 (Abraxane held)  Cycle 21 gemcitabine 07/25/2019 (Abraxane held)  CTabdomen/pelvis 08/05/2019-stable small liver lesions, stable pancreas mass, no evidence of disease progression  Cycle 22 gemcitabine 08/08/2019 (Abraxane held)  Cycle 23 gemcitabine 08/22/2019 (Abraxane held)  Cycle 24 gemcitabine 09/05/2019 (Abraxane held)  Cycle 25 gemcitabine 09/19/2019 (Abraxane held)  6.Port-A-Cath placement by Dr. 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 appears stable.  There is no clinical evidence of disease progression.  Plan to proceed with cycle 25 gemcitabine today as scheduled.  He will undergo restaging CTs prior to his next visit in 2 weeks.  We reviewed the CBC from today.  Counts adequate to proceed with treatment.  He has stable mild thrombocytopenia.  He will contact the office with any bleeding.  He will return for lab, follow-up, gemcitabine in 2 weeks.  He will contact the office in the interim as outlined above or with any other problems.    Ned Card ANP/GNP-BC   09/19/2019  10:41 AM

## 2019-09-19 NOTE — Patient Instructions (Signed)
[facility] °Discharge Instructions for Patients Receiving Chemotherapy ° °Today you received the following chemotherapy agents Gemzar ° °To help prevent nausea and vomiting after your treatment, we encourage you to take your nausea medication as directed. °  °If you develop nausea and vomiting that is not controlled by your nausea medication, call the clinic.  ° °BELOW ARE SYMPTOMS THAT SHOULD BE REPORTED IMMEDIATELY: °· *FEVER GREATER THAN 100.5 F °· *CHILLS WITH OR WITHOUT FEVER °· NAUSEA AND VOMITING THAT IS NOT CONTROLLED WITH YOUR NAUSEA MEDICATION °· *UNUSUAL SHORTNESS OF BREATH °· *UNUSUAL BRUISING OR BLEEDING °· TENDERNESS IN MOUTH AND THROAT WITH OR WITHOUT PRESENCE OF ULCERS °· *URINARY PROBLEMS °· *BOWEL PROBLEMS °· UNUSUAL RASH °Items with * indicate a potential emergency and should be followed up as soon as possible. ° °Feel free to call the clinic should you have any questions or concerns. The clinic phone number is [phone]. ° °Please show the CHEMO ALERT CARD at check-in to the Emergency Department and triage nurse. ° ° °

## 2019-09-20 LAB — CANCER ANTIGEN 19-9: CA 19-9: 75 U/mL — ABNORMAL HIGH (ref 0–35)

## 2019-09-28 ENCOUNTER — Other Ambulatory Visit: Payer: Self-pay | Admitting: Oncology

## 2019-09-29 NOTE — Progress Notes (Signed)
Pharmacist Chemotherapy Monitoring - Follow Up Assessment    I verify that I have reviewed each item in the below checklist:  . Regimen for the patient is scheduled for the appropriate day and plan matches scheduled date. Marland Kitchen Appropriate non-routine labs are ordered dependent on drug ordered. . If applicable, additional medications reviewed and ordered per protocol based on lifetime cumulative doses and/or treatment regimen.   Plan for follow-up and/or issues identified: No . I-vent associated with next due treatment: No . MD and/or nursing notified: No  Alex [name] 09/29/2019 3:02 PM

## 2019-09-30 ENCOUNTER — Ambulatory Visit (HOSPITAL_COMMUNITY)
Admission: RE | Admit: 2019-09-30 | Discharge: 2019-09-30 | Disposition: A | Discharge status: Home / Self Care | Payer: Self-pay | Source: Ambulatory Visit | Attending: Nurse Practitioner | Admitting: Nurse Practitioner

## 2019-09-30 DIAGNOSIS — C252 Malignant neoplasm of tail of pancreas: Secondary | ICD-10-CM | POA: Insufficient documentation

## 2019-09-30 MED ORDER — SODIUM CHLORIDE (PF) 0.9 % IJ SOLN
INTRAMUSCULAR | Status: AC
  Filled 2019-09-30: qty 50

## 2019-09-30 MED ORDER — IOHEXOL 300 MG/ML  SOLN
100.0000 mL | Freq: Once | INTRAMUSCULAR | Status: AC | PRN
  Administered 2019-09-30: 100 mL via INTRAVENOUS

## 2019-10-03 ENCOUNTER — Other Ambulatory Visit: Payer: Self-pay

## 2019-10-03 ENCOUNTER — Inpatient Hospital Stay: Payer: Self-pay

## 2019-10-03 ENCOUNTER — Inpatient Hospital Stay: Payer: Self-pay | Attending: Nurse Practitioner | Admitting: Oncology

## 2019-10-03 VITALS — BP 137/78 | HR 63 | Temp 98.5°F | Resp 17 | Ht 66.0 in | Wt 215.9 lb

## 2019-10-03 DIAGNOSIS — C252 Malignant neoplasm of tail of pancreas: Secondary | ICD-10-CM

## 2019-10-03 DIAGNOSIS — C787 Secondary malignant neoplasm of liver and intrahepatic bile duct: Secondary | ICD-10-CM | POA: Insufficient documentation

## 2019-10-03 DIAGNOSIS — Z5111 Encounter for antineoplastic chemotherapy: Secondary | ICD-10-CM | POA: Insufficient documentation

## 2019-10-03 DIAGNOSIS — Z95828 Presence of other vascular implants and grafts: Secondary | ICD-10-CM

## 2019-10-03 LAB — CBC WITH DIFFERENTIAL (CANCER CENTER ONLY)
Abs Immature Granulocytes: 0.02 10*3/uL (ref 0.00–0.07)
Basophils Absolute: 0 10*3/uL (ref 0.0–0.1)
Basophils Relative: 1 %
Eosinophils Absolute: 0.3 10*3/uL (ref 0.0–0.5)
Eosinophils Relative: 4 %
HCT: 45.5 % (ref 39.0–52.0)
Hemoglobin: 14.5 g/dL (ref 13.0–17.0)
Immature Granulocytes: 0 %
Lymphocytes Relative: 16 %
Lymphs Abs: 1.1 10*3/uL (ref 0.7–4.0)
MCH: 29.7 pg (ref 26.0–34.0)
MCHC: 31.9 g/dL (ref 30.0–36.0)
MCV: 93.2 fL (ref 80.0–100.0)
Monocytes Absolute: 0.9 10*3/uL (ref 0.1–1.0)
Monocytes Relative: 13 %
Neutro Abs: 4.7 10*3/uL (ref 1.7–7.7)
Neutrophils Relative %: 66 %
Platelet Count: 114 10*3/uL — ABNORMAL LOW (ref 150–400)
RBC: 4.88 MIL/uL (ref 4.22–5.81)
RDW: 14.4 % (ref 11.5–15.5)
WBC Count: 6.9 10*3/uL (ref 4.0–10.5)
nRBC: 0 % (ref 0.0–0.2)

## 2019-10-03 LAB — CMP (CANCER CENTER ONLY)
ALT: 32 U/L (ref 0–44)
AST: 45 U/L — ABNORMAL HIGH (ref 15–41)
Albumin: 3.4 g/dL — ABNORMAL LOW (ref 3.5–5.0)
Alkaline Phosphatase: 71 U/L (ref 38–126)
Anion gap: 10 (ref 5–15)
BUN: 13 mg/dL (ref 8–23)
CO2: 27 mmol/L (ref 22–32)
Calcium: 8.2 mg/dL — ABNORMAL LOW (ref 8.9–10.3)
Chloride: 104 mmol/L (ref 98–111)
Creatinine: 0.92 mg/dL (ref 0.61–1.24)
GFR, Est AFR Am: >60 mL/min (ref >60)
GFR, Estimated: >60 mL/min (ref >60)
Glucose, Bld: 127 mg/dL — ABNORMAL HIGH (ref 70–99)
Potassium: 3.9 mmol/L (ref 3.5–5.1)
Sodium: 141 mmol/L (ref 135–145)
Total Bilirubin: 0.5 mg/dL (ref 0.3–1.2)
Total Protein: 6.6 g/dL (ref 6.5–8.1)

## 2019-10-03 MED ORDER — SODIUM CHLORIDE 0.9 % IV SOLN
2000.0000 mg | Freq: Once | INTRAVENOUS | Status: AC
  Administered 2019-10-03: 2000 mg via INTRAVENOUS
  Filled 2019-10-03: qty 52.6

## 2019-10-03 MED ORDER — PROCHLORPERAZINE MALEATE 10 MG PO TABS
10.0000 mg | ORAL_TABLET | Freq: Once | ORAL | Status: AC
  Administered 2019-10-03: 10 mg via ORAL

## 2019-10-03 MED ORDER — SODIUM CHLORIDE 0.9 % IV SOLN
Freq: Once | INTRAVENOUS | Status: AC
  Filled 2019-10-03: qty 250

## 2019-10-03 MED ORDER — HEPARIN SOD (PORK) LOCK FLUSH 100 UNIT/ML IV SOLN
500.0000 [IU] | Freq: Once | INTRAVENOUS | Status: AC | PRN
  Administered 2019-10-03: 500 [IU]
  Filled 2019-10-03: qty 5

## 2019-10-03 MED ORDER — PACLITAXEL PROTEIN-BOUND CHEMO INJECTION 100 MG
100.0000 mg/m2 | Freq: Once | INTRAVENOUS | Status: AC
  Administered 2019-10-03: 200 mg via INTRAVENOUS
  Filled 2019-10-03: qty 40

## 2019-10-03 MED ORDER — SODIUM CHLORIDE 0.9% FLUSH
10.0000 mL | INTRAVENOUS | Status: DC | PRN
  Administered 2019-10-03: 10 mL
  Filled 2019-10-03: qty 10

## 2019-10-03 MED ORDER — PROCHLORPERAZINE MALEATE 10 MG PO TABS
ORAL_TABLET | ORAL | Status: AC
  Filled 2019-10-03: qty 1

## 2019-10-03 NOTE — Progress Notes (Signed)
[facility] OFFICE PROGRESS NOTE   Diagnosis: Pancreas cancer  INTERVAL HISTORY:   Mr. Oleski completed another treatment with gemcitabine on 09/19/2019.  He feels well.  No complaint.  He continues to have mild numbness in the hands and feet.  The numbness is chiefly in the feet and first fingers.  The numbness does not interfere with activity.  Objective:  Vital signs in last 24 hours:  Blood pressure 137/78, pulse 63, temperature 98.5 F (36.9 C), temperature source Temporal, resp. rate 17, height 5' 6" (1.676 m), weight 215 lb 14.4 oz (97.9 kg), SpO2 98 %.     GI: No mass, nontender, no hepatosplenomegaly Vascular: No leg edema Neuro: Mild loss of vibratory sense at the fingertips bilaterally    Portacath/PICC-without erythema  Lab Results:  Lab Results  Component Value Date   WBC 6.9 10/03/2019   HGB 14.5 10/03/2019   HCT 45.5 10/03/2019   MCV 93.2 10/03/2019   PLT 114 (L) 10/03/2019   NEUTROABS 4.7 10/03/2019    CMP  Lab Results  Component Value Date   NA 141 10/03/2019   K 3.9 10/03/2019   CL 104 10/03/2019   CO2 27 10/03/2019   GLUCOSE 127 (H) 10/03/2019   BUN 13 10/03/2019   CREATININE 0.92 10/03/2019   CALCIUM 8.2 (L) 10/03/2019   PROT 6.6 10/03/2019   ALBUMIN 3.4 (L) 10/03/2019   AST 45 (H) 10/03/2019   ALT 32 10/03/2019   ALKPHOS 71 10/03/2019   BILITOT 0.5 10/03/2019   GFRNONAA >60 10/03/2019   GFRAA >60 10/03/2019    Lab Results  Component Value Date   CEA1 1.19 08/19/2018    Lab Results  Component Value Date   INR 1.0 08/14/2018    Imaging:  CT Abdomen Pelvis W Contrast  Result Date: 09/30/2019 CLINICAL DATA:  Restaging pancreatic cancer EXAM: CT ABDOMEN AND PELVIS WITH CONTRAST TECHNIQUE: Multidetector CT imaging of the abdomen and pelvis was performed using the standard protocol following bolus administration of intravenous contrast. CONTRAST:  157m OMNIPAQUE IOHEXOL 300 MG/ML SOLN, additional oral enteric  contrast COMPARISON:  08/05/2019 FINDINGS: Lower chest: No acute abnormality. Unchanged trace right pleural effusion and/or pleural thickening and associated atelectasis or consolidation. Hepatobiliary: Low-attenuation metastases of the peripheral right lobe of the liver may be slightly enlarged compared to prior examination, the larger of two adjacent lesions measuring approximately 1.0 cm, previously 7 mm (series 7, image 37). No new lesions are appreciated. Severe hepatic steatosis. No gallstones, gallbladder wall thickening, or biliary dilatation. Pancreas: No significant change in a hypoenhancing mass of the pancreatic tail about the splenic vessels measuring approximately 3.0 x 1.5 cm (series 7, image 57). No pancreatic ductal dilatation or surrounding inflammatory changes. Spleen: Normal in size without significant abnormality. Adrenals/Urinary Tract: Adrenal glands are unremarkable. Kidneys are normal, without renal calculi, solid lesion, or hydronephrosis. Bladder is unremarkable. Stomach/Bowel: Stomach is within normal limits. Appendix appears normal. No evidence of bowel wall thickening, distention, or inflammatory changes. 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 Vascular/Lymphatic: Aortic atherosclerosis. No enlarged abdominal or pelvic lymph nodes. Reproductive: No mass or other significant abnormality. Other: No abdominal wall hernia or abnormality. No abdominopelvic ascites. Musculoskeletal: No acute or significant osseous findings. IMPRESSION: 1. No significant change in a hypoenhancing mass of the pancreatic tail about the splenic vessels measuring approximately 3.0 x 1.5 cm. 2. Low-attenuation metastases of the peripheral right lobe of the liver may be slightly enlarged compared  to prior examination, the larger of two adjacent lesions measuring approximately 1.0 cm, previously 7 mm (series 7, image 37). No new lesions are  appreciated. 3.  Severe hepatic steatosis. 4. Unchanged trace right pleural effusion and/or pleural thickening with associated atelectasis or consolidation. 5. Redemonstrated postoperative findings status post Hartmann procedure. 6.  Aortic Atherosclerosis (ICD10-I70.0). Electronically Signed   By: Eddie Candle M.D.   On: 09/30/2019 18:55    Medications: I have reviewed the patient's current medications.   Assessment/Plan: 1. 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 Well-differentiated adenocarcinoma, 0/13 lymph nodes positive, MSI-stable, no loss of mismatch repair protein expression ? Tumor involving the rectosigmoid junction ? Sigmoidoscopy 09/18/2017-distal sigmoid colon tumor beginning between 15 and 20 cm from the dentate line, completely obstructing ? CT abdomen/pelvis 09/14/2017-sigmoid thickening no evidence of metastatic disease ? 2 mm right upper lobe nodule on CT 08/04/2017 ? Colonoscopy 02/07/2018-polyps removed from the cecum, ascending colon, transverse colon, descending colon, rectosigmoid colon and rectum (tubular adenomas)  2. Right lung pneumonia/empyema March 2019  Right VATS, drainage of empyema, and decortication of the right lower lobe 08/31/2017  3.CVA in 2010  4.Rectal and active sigmoid polyps noted on flexible sigmoidoscopy 09/18/2017  5.Pancreas tail mass/right liver lesions and 4 mm splenic lesion on CT abdomen/pelvis 05/27/2018  MRI abdomen 07/27/2018-right liver lesions, rim-enhancing lesion in the pancreas tail  Ultrasound-guided biopsy of a right liver lesion 08/14/2018-metastatic adenocarcinoma, cytokeratin 7+, CDX-2 weak positive, cytokeratin 20 negative, consistent with metastatic pancreas cancer; MSI stable; tumor mutational burden 0  CT abdomen/pelvis 09/26/2018-increased size of previously noted liver lesions, new right liver lesion, increased size of  pancreas mass with occlusion of the splenic vein  Cycle 1 gemcitabine/Abraxane 10/11/2018  Cycle 2 gemcitabine/Abraxane 10/25/2018  Cycle 3 gemcitabine/Abraxane 11/08/2018  Cycle 4 gemcitabine/Abraxane 11/22/2018  Cycle 5 gemcitabine/Abraxane 12/06/2018  CT 12/18/2018-overall improved with reduced size of the hepatic metastatic lesions, mildly reduced size of the pancreatic tail mass.  Cycle 6 gemcitabine/Abraxane 12/20/2018  Cycle 7 gemcitabine/Abraxane 01/03/2019  Cycle 8 gemcitabine/Abraxane 01/17/2019  Cycle 9 gemcitabine/Abraxane 01/31/2019  Cycle 10 gemcitabine/Abraxane 02/13/2019  Cycle 11 gemcitabine/Abraxane 02/28/2019 (Abraxane held secondary to neuropathy symptoms)  Cycle 12 gemcitabine/Abraxane 03/14/2019 (Abraxane held due to persistent neuropathy symptoms)  Cycle 13 gemcitabine/Abraxane 03/28/2019 (Abraxane held due to persistent neuropathy symptoms)  Cycle 14 gemcitabine/Abraxane 04/11/2019 (Abraxane held due to persistent neuropathy symptoms)  CT abdomen/pelvis 04/28/2019-decrease in size of pancreatic mass. Decrease in size of hepatic metastatic foci.   Cycle 15 gemcitabine12/05/2018(Abraxane held due to neuropathy symptoms)  Cycle 16 gemcitabine 05/16/2019 (Abraxane held due to neuropathy symptoms)  Cycle 17 gemcitabine 05/31/2019 (Abraxane on hold due to neuropathy symptoms)  Cycle 18 gemcitabine 06/13/2019 (Abraxane held)  Cycle 19 gemcitabine 06/27/2019 (Abraxane held)  Cycle 20 gemcitabine 07/11/2019 (Abraxane held)  Cycle 21 gemcitabine 07/25/2019 (Abraxane held)  CTabdomen/pelvis 08/05/2019-stable small liver lesions, stable pancreas mass, no evidence of disease progression  Cycle 22 gemcitabine 08/08/2019 (Abraxane held)  Cycle 23 gemcitabine 08/22/2019 (Abraxane held)  Cycle 24 gemcitabine 09/05/2019 (Abraxane held)  Cycle 25 gemcitabine 09/19/2019 (Abraxane held)  CT abdomen/pelvis 09/30/2019-no change in pancreas mass, slight enlargement of right liver  metastases, largest measuring 1 cm, no new lesions  Cycle 26 gemcitabine/Abraxane 10/03/2019-Abraxane resumed at a dose of 100 mg/m  6.Port-A-Cath placement by Dr. 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 Gaughan appears stable.  The CA 19-9 has increased over the past several months.  The restaging CTs revealed minimal enlargement and right liver lesions and no other evidence of disease progression.  He appears well.  He has mild neuropathy symptoms related to Abraxane therapy.  I reviewed the CT images and discussed treatment options with Mr. Rolfson and his sister.  We discussed a treatment break, continuing gemcitabine, and resuming gemcitabine/Abraxane therapy.  Mr. Cadieux is in agreement with resuming gemcitabine/Abraxane.  He understands the potential for worsening neuropathy.  We will monitor him closely for progressive neuropathy symptoms.  He will complete a cycle of gemcitabine/Abraxane today.  Mr. Mamula will return for an office visit and chemotherapy in 2 weeks.  Betsy Coder, MD  10/03/2019  10:48 AM

## 2019-10-03 NOTE — Patient Instructions (Signed)
[facility] °Discharge Instructions for Patients Receiving Chemotherapy ° °Today you received the following chemotherapy agents Gemzar ° °To help prevent nausea and vomiting after your treatment, we encourage you to take your nausea medication as directed. °  °If you develop nausea and vomiting that is not controlled by your nausea medication, call the clinic.  ° °BELOW ARE SYMPTOMS THAT SHOULD BE REPORTED IMMEDIATELY: °· *FEVER GREATER THAN 100.5 F °· *CHILLS WITH OR WITHOUT FEVER °· NAUSEA AND VOMITING THAT IS NOT CONTROLLED WITH YOUR NAUSEA MEDICATION °· *UNUSUAL SHORTNESS OF BREATH °· *UNUSUAL BRUISING OR BLEEDING °· TENDERNESS IN MOUTH AND THROAT WITH OR WITHOUT PRESENCE OF ULCERS °· *URINARY PROBLEMS °· *BOWEL PROBLEMS °· UNUSUAL RASH °Items with * indicate a potential emergency and should be followed up as soon as possible. ° °Feel free to call the clinic should you have any questions or concerns. The clinic phone number is [phone]. ° °Please show the CHEMO ALERT CARD at check-in to the Emergency Department and triage nurse. ° ° °

## 2019-10-04 LAB — CANCER ANTIGEN 19-9: CA 19-9: 100 U/mL — ABNORMAL HIGH (ref 0–35)

## 2019-10-06 ENCOUNTER — Telehealth: Payer: Self-pay | Admitting: Oncology

## 2019-10-06 NOTE — Telephone Encounter (Signed)
Scheduled per 5/7 los. Print off schedule next visit 5/21.

## 2019-10-12 ENCOUNTER — Other Ambulatory Visit: Payer: Self-pay | Admitting: Oncology

## 2019-10-13 NOTE — Progress Notes (Signed)
Pharmacist Chemotherapy Monitoring - Follow Up Assessment    I verify that I have reviewed each item in the below checklist:  . Regimen for the patient is scheduled for the appropriate day and plan matches scheduled date. Marland Kitchen Appropriate non-routine labs are ordered dependent on drug ordered. . If applicable, additional medications reviewed and ordered per protocol based on lifetime cumulative doses and/or treatment regimen.   Plan for follow-up and/or issues identified: No . I-vent associated with next due treatment: No . MD and/or nursing notified: No  Zara Wendt D 10/13/2019 3:21 PM

## 2019-10-17 ENCOUNTER — Other Ambulatory Visit: Payer: Self-pay

## 2019-10-17 ENCOUNTER — Inpatient Hospital Stay (HOSPITAL_BASED_OUTPATIENT_CLINIC_OR_DEPARTMENT_OTHER): Payer: Self-pay | Admitting: Oncology

## 2019-10-17 ENCOUNTER — Inpatient Hospital Stay: Payer: Self-pay

## 2019-10-17 ENCOUNTER — Encounter: Payer: Self-pay | Admitting: Pharmacy Technician

## 2019-10-17 VITALS — BP 144/85 | HR 71 | Temp 97.7°F | Resp 18 | Ht 66.0 in | Wt 220.3 lb

## 2019-10-17 DIAGNOSIS — Z95828 Presence of other vascular implants and grafts: Secondary | ICD-10-CM

## 2019-10-17 DIAGNOSIS — C252 Malignant neoplasm of tail of pancreas: Secondary | ICD-10-CM

## 2019-10-17 LAB — CBC WITH DIFFERENTIAL (CANCER CENTER ONLY)
Abs Immature Granulocytes: 0.03 10*3/uL (ref 0.00–0.07)
Basophils Absolute: 0.1 10*3/uL (ref 0.0–0.1)
Basophils Relative: 1 %
Eosinophils Absolute: 0.3 10*3/uL (ref 0.0–0.5)
Eosinophils Relative: 8 %
HCT: 40.4 % (ref 39.0–52.0)
Hemoglobin: 12.8 g/dL — ABNORMAL LOW (ref 13.0–17.0)
Immature Granulocytes: 1 %
Lymphocytes Relative: 22 %
Lymphs Abs: 0.8 10*3/uL (ref 0.7–4.0)
MCH: 29.8 pg (ref 26.0–34.0)
MCHC: 31.7 g/dL (ref 30.0–36.0)
MCV: 94.2 fL (ref 80.0–100.0)
Monocytes Absolute: 0.7 10*3/uL (ref 0.1–1.0)
Monocytes Relative: 17 %
Neutro Abs: 2 10*3/uL (ref 1.7–7.7)
Neutrophils Relative %: 51 %
Platelet Count: 145 10*3/uL — ABNORMAL LOW (ref 150–400)
RBC: 4.29 MIL/uL (ref 4.22–5.81)
RDW: 14.6 % (ref 11.5–15.5)
WBC Count: 3.9 10*3/uL — ABNORMAL LOW (ref 4.0–10.5)
nRBC: 0 % (ref 0.0–0.2)

## 2019-10-17 LAB — CMP (CANCER CENTER ONLY)
ALT: 27 U/L (ref 0–44)
AST: 42 U/L — ABNORMAL HIGH (ref 15–41)
Albumin: 2.9 g/dL — ABNORMAL LOW (ref 3.5–5.0)
Alkaline Phosphatase: 65 U/L (ref 38–126)
Anion gap: 7 (ref 5–15)
BUN: 15 mg/dL (ref 8–23)
CO2: 28 mmol/L (ref 22–32)
Calcium: 8 mg/dL — ABNORMAL LOW (ref 8.9–10.3)
Chloride: 104 mmol/L (ref 98–111)
Creatinine: 0.92 mg/dL (ref 0.61–1.24)
GFR, Est AFR Am: >60 mL/min (ref >60)
GFR, Estimated: >60 mL/min (ref >60)
Glucose, Bld: 201 mg/dL — ABNORMAL HIGH (ref 70–99)
Potassium: 3.5 mmol/L (ref 3.5–5.1)
Sodium: 139 mmol/L (ref 135–145)
Total Bilirubin: 0.4 mg/dL (ref 0.3–1.2)
Total Protein: 5.9 g/dL — ABNORMAL LOW (ref 6.5–8.1)

## 2019-10-17 MED ORDER — SODIUM CHLORIDE 0.9% FLUSH
10.0000 mL | INTRAVENOUS | Status: DC | PRN
  Administered 2019-10-17: 10 mL
  Filled 2019-10-17: qty 10

## 2019-10-17 MED ORDER — SODIUM CHLORIDE 0.9 % IV SOLN
Freq: Once | INTRAVENOUS | Status: AC
  Filled 2019-10-17: qty 250

## 2019-10-17 MED ORDER — PACLITAXEL PROTEIN-BOUND CHEMO INJECTION 100 MG
100.0000 mg/m2 | Freq: Once | INTRAVENOUS | Status: AC
  Administered 2019-10-17: 200 mg via INTRAVENOUS
  Filled 2019-10-17: qty 40

## 2019-10-17 MED ORDER — PROCHLORPERAZINE MALEATE 10 MG PO TABS
10.0000 mg | ORAL_TABLET | Freq: Once | ORAL | Status: AC
  Administered 2019-10-17: 10 mg via ORAL

## 2019-10-17 MED ORDER — SODIUM CHLORIDE 0.9 % IV SOLN
2000.0000 mg | Freq: Once | INTRAVENOUS | Status: AC
  Administered 2019-10-17: 2000 mg via INTRAVENOUS
  Filled 2019-10-17: qty 52.6

## 2019-10-17 MED ORDER — PROCHLORPERAZINE MALEATE 10 MG PO TABS
ORAL_TABLET | ORAL | Status: AC
  Filled 2019-10-17: qty 1

## 2019-10-17 MED ORDER — HEPARIN SOD (PORK) LOCK FLUSH 100 UNIT/ML IV SOLN
500.0000 [IU] | Freq: Once | INTRAVENOUS | Status: AC | PRN
  Administered 2019-10-17: 500 [IU]
  Filled 2019-10-17: qty 5

## 2019-10-17 NOTE — Patient Instructions (Signed)
Nanoparticle Albumin-Bound Paclitaxel injection What is this medicine? NANOPARTICLE ALBUMIN-BOUND PACLITAXEL (Na no PAHR ti kuhl al BYOO muhn-bound PAK li TAX el) is a chemotherapy drug. It targets fast dividing cells, like cancer cells, and causes these cells to die. This medicine is used to treat advanced breast cancer, lung cancer, and pancreatic cancer. This medicine may be used for other purposes; ask your health care provider or pharmacist if you have questions. COMMON BRAND NAME(S): Abraxane What should I tell my health care provider before I take this medicine? They need to know if you have any of these conditions:  kidney disease  liver disease  low blood counts, like low white cell, platelet, or red cell counts  lung or breathing disease, like asthma  tingling of the fingers or toes, or other nerve disorder  an unusual or allergic reaction to paclitaxel, albumin, other chemotherapy, other medicines, foods, dyes, or preservatives  pregnant or trying to get pregnant  breast-feeding How should I use this medicine? This drug is given as an infusion into a vein. It is administered in a hospital or clinic by a specially trained health care professional. Talk to your pediatrician regarding the use of this medicine in children. Special care may be needed. Overdosage: If you think you have taken too much of this medicine contact a poison control center or emergency room at once. NOTE: This medicine is only for you. Do not share this medicine with others. What if I miss a dose? It is important not to miss your dose. Call your doctor or health care professional if you are unable to keep an appointment. What may interact with this medicine? This medicine may interact with the following medications:  antiviral medicines for hepatitis, HIV or AIDS  certain antibiotics like erythromycin and clarithromycin  certain medicines for fungal infections like ketoconazole and  itraconazole  certain medicines for seizures like carbamazepine, phenobarbital, phenytoin  gemfibrozil  nefazodone  rifampin  St. John's wort This list may not describe all possible interactions. Give your health care provider a list of all the medicines, herbs, non-prescription drugs, or dietary supplements you use. Also tell them if you smoke, drink alcohol, or use illegal drugs. Some items may interact with your medicine. What should I watch for while using this medicine? Your condition will be monitored carefully while you are receiving this medicine. You will need important blood work done while you are taking this medicine. This medicine can cause serious allergic reactions. If you experience allergic reactions like skin rash, itching or hives, swelling of the face, lips, or tongue, tell your doctor or health care professional right away. In some cases, you may be given additional medicines to help with side effects. Follow all directions for their use. This drug may make you feel generally unwell. This is not uncommon, as chemotherapy can affect healthy cells as well as cancer cells. Report any side effects. Continue your course of treatment even though you feel ill unless your doctor tells you to stop. Call your doctor or health care professional for advice if you get a fever, chills or sore throat, or other symptoms of a cold or flu. Do not treat yourself. This drug decreases your body's ability to fight infections. Try to avoid being around people who are sick. This medicine may increase your risk to bruise or bleed. Call your doctor or health care professional if you notice any unusual bleeding. Be careful brushing and flossing your teeth or using a toothpick because you may  get an infection or bleed more easily. If you have any dental work done, tell your dentist you are receiving this medicine. Avoid taking products that contain aspirin, acetaminophen, ibuprofen, naproxen, or  ketoprofen unless instructed by your doctor. These medicines may hide a fever. Do not become pregnant while taking this medicine or for 6 months after stopping it. Women should inform their doctor if they wish to become pregnant or think they might be pregnant. Men should not father a child while taking this medicine or for 3 months after stopping it. There is a potential for serious side effects to an unborn child. Talk to your health care professional or pharmacist for more information. Do not breast-feed an infant while taking this medicine or for 2 weeks after stopping it. This medicine may interfere with the ability to get pregnant or to father a child. You should talk to your doctor or health care professional if you are concerned about your fertility. What side effects may I notice from receiving this medicine? Side effects that you should report to your doctor or health care professional as soon as possible:  allergic reactions like skin rash, itching or hives, swelling of the face, lips, or tongue  breathing problems  changes in vision  fast, irregular heartbeat  low blood pressure  mouth sores  pain, tingling, numbness in the hands or feet  signs of decreased platelets or bleeding - bruising, pinpoint red spots on the skin, black, tarry stools, blood in the urine  signs of decreased red blood cells - unusually weak or tired, feeling faint or lightheaded, falls  signs of infection - fever or chills, cough, sore throat, pain or difficulty passing urine  signs and symptoms of liver injury like dark yellow or brown urine; general ill feeling or flu-like symptoms; light-colored stools; loss of appetite; nausea; right upper belly pain; unusually weak or tired; yellowing of the eyes or skin  swelling of the ankles, feet, hands  unusually slow heartbeat Side effects that usually do not require medical attention (report to your doctor or health care professional if they continue or  are bothersome):  diarrhea  hair loss  loss of appetite  nausea, vomiting  tiredness This list may not describe all possible side effects. Call your doctor for medical advice about side effects. You may report side effects to FDA at 1-800-FDA-1088. Where should I keep my medicine? This drug is given in a hospital or clinic and will not be stored at home. NOTE: This sheet is a summary. It may not cover all possible information. If you have questions about this medicine, talk to your doctor, pharmacist, or health care provider.  2020 Elsevier/Gold Standard (2017-01-16 13:03:45)  Gemcitabine injection What is this medicine? GEMCITABINE (jem SYE ta been) is a chemotherapy drug. This medicine is used to treat many types of cancer like breast cancer, lung cancer, pancreatic cancer, and ovarian cancer. This medicine may be used for other purposes; ask your health care provider or pharmacist if you have questions. COMMON BRAND NAME(S): Gemzar, Infugem What should I tell my health care provider before I take this medicine? They need to know if you have any of these conditions:  blood disorders  infection  kidney disease  liver disease  lung or breathing disease, like asthma  recent or ongoing radiation therapy  an unusual or allergic reaction to gemcitabine, other chemotherapy, other medicines, foods, dyes, or preservatives  pregnant or trying to get pregnant  breast-feeding How should I use this  medicine? This drug is given as an infusion into a vein. It is administered in a hospital or clinic by a specially trained health care professional. Talk to your pediatrician regarding the use of this medicine in children. Special care may be needed. Overdosage: If you think you have taken too much of this medicine contact a poison control center or emergency room at once. NOTE: This medicine is only for you. Do not share this medicine with others. What if I miss a dose? It is  important not to miss your dose. Call your doctor or health care professional if you are unable to keep an appointment. What may interact with this medicine?  medicines to increase blood counts like filgrastim, pegfilgrastim, sargramostim  some other chemotherapy drugs like cisplatin  vaccines Talk to your doctor or health care professional before taking any of these medicines:  acetaminophen  aspirin  ibuprofen  ketoprofen  naproxen This list may not describe all possible interactions. Give your health care provider a list of all the medicines, herbs, non-prescription drugs, or dietary supplements you use. Also tell them if you smoke, drink alcohol, or use illegal drugs. Some items may interact with your medicine. What should I watch for while using this medicine? Visit your doctor for checks on your progress. This drug may make you feel generally unwell. This is not uncommon, as chemotherapy can affect healthy cells as well as cancer cells. Report any side effects. Continue your course of treatment even though you feel ill unless your doctor tells you to stop. In some cases, you may be given additional medicines to help with side effects. Follow all directions for their use. Call your doctor or health care professional for advice if you get a fever, chills or sore throat, or other symptoms of a cold or flu. Do not treat yourself. This drug decreases your body's ability to fight infections. Try to avoid being around people who are sick. This medicine may increase your risk to bruise or bleed. Call your doctor or health care professional if you notice any unusual bleeding. Be careful brushing and flossing your teeth or using a toothpick because you may get an infection or bleed more easily. If you have any dental work done, tell your dentist you are receiving this medicine. Avoid taking products that contain aspirin, acetaminophen, ibuprofen, naproxen, or ketoprofen unless instructed by  your doctor. These medicines may hide a fever. Do not become pregnant while taking this medicine or for 6 months after stopping it. Women should inform their doctor if they wish to become pregnant or think they might be pregnant. Men should not father a child while taking this medicine and for 3 months after stopping it. There is a potential for serious side effects to an unborn child. Talk to your health care professional or pharmacist for more information. Do not breast-feed an infant while taking this medicine or for at least 1 week after stopping it. Men should inform their doctors if they wish to father a child. This medicine may lower sperm counts. Talk with your doctor or health care professional if you are concerned about your fertility. What side effects may I notice from receiving this medicine? Side effects that you should report to your doctor or health care professional as soon as possible:  allergic reactions like skin rash, itching or hives, swelling of the face, lips, or tongue  breathing problems  pain, redness, or irritation at site where injected  signs and symptoms of a dangerous  change in heartbeat or heart rhythm like chest pain; dizziness; fast or irregular heartbeat; palpitations; feeling faint or lightheaded, falls; breathing problems  signs of decreased platelets or bleeding - bruising, pinpoint red spots on the skin, black, tarry stools, blood in the urine  signs of decreased red blood cells - unusually weak or tired, feeling faint or lightheaded, falls  signs of infection - fever or chills, cough, sore throat, pain or difficulty passing urine  signs and symptoms of kidney injury like trouble passing urine or change in the amount of urine  signs and symptoms of liver injury like dark yellow or brown urine; general ill feeling or flu-like symptoms; light-colored stools; loss of appetite; nausea; right upper belly pain; unusually weak or tired; yellowing of the eyes or  skin  swelling of ankles, feet, hands Side effects that usually do not require medical attention (report to your doctor or health care professional if they continue or are bothersome):  constipation  diarrhea  hair loss  loss of appetite  nausea  rash  vomiting This list may not describe all possible side effects. Call your doctor for medical advice about side effects. You may report side effects to FDA at 1-800-FDA-1088. Where should I keep my medicine? This drug is given in a hospital or clinic and will not be stored at home. NOTE: This sheet is a summary. It may not cover all possible information. If you have questions about this medicine, talk to your doctor, pharmacist, or health care provider.  2020 Elsevier/Gold Standard (2017-08-08 18:06:11)

## 2019-10-17 NOTE — Progress Notes (Signed)
[place] OFFICE PROGRESS NOTE   Diagnosis: Pancreas cancer  INTERVAL HISTORY:   Alex [name] completed another cycle of gemcitabine/Abraxane on 10/03/2019.  He reports mild nausea following chemotherapy.  No change in neuropathy symptoms.  No other complaint.  The colostomy is functioning well.  Objective:  Vital signs in last 24 hours:  Blood pressure (!) 144/85, pulse 71, temperature 97.7 F (36.5 C), temperature source Oral, resp. rate 18, height '5\' 6"'  (1.676 m), weight 220 lb 4.8 oz (99.9 kg), SpO2 97 %.    Resp: Decreased breath sounds with inspiratory rales at the right lower posterior chest, no respiratory distress Cardio: Regular rate and rhythm GI: No hepatosplenomegaly, no mass, nontender Vascular: No leg edema, the right lower leg is slightly larger than the left side   Portacath/PICC-without erythema  Lab Results:  Lab Results  Component Value Date   WBC 3.9 (L) 10/17/2019   HGB 12.8 (L) 10/17/2019   HCT 40.4 10/17/2019   MCV 94.2 10/17/2019   PLT 145 (L) 10/17/2019   NEUTROABS 2.0 10/17/2019    CMP  Lab Results  Component Value Date   NA 139 10/17/2019   K 3.5 10/17/2019   CL 104 10/17/2019   CO2 28 10/17/2019   GLUCOSE 201 (H) 10/17/2019   BUN 15 10/17/2019   CREATININE 0.92 10/17/2019   CALCIUM 8.0 (L) 10/17/2019   PROT 5.9 (L) 10/17/2019   ALBUMIN 2.9 (L) 10/17/2019   AST 42 (H) 10/17/2019   ALT 27 10/17/2019   ALKPHOS 65 10/17/2019   BILITOT 0.4 10/17/2019   GFRNONAA >60 10/17/2019   GFRAA >60 10/17/2019    Lab Results  Component Value Date   CEA1 1.19 08/19/2018    Lab Results  Component Value Date   INR 1.0 08/14/2018    Imaging:  No results found.  Medications: I have reviewed the patient's current medications.   Assessment/Plan: 1. 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 Well-differentiated adenocarcinoma, 0/13 lymph nodes  positive, MSI-stable, no loss of mismatch repair protein expression ? Tumor involving the rectosigmoid junction ? Sigmoidoscopy 09/18/2017-distal sigmoid colon tumor beginning between 15 and 20 cm from the dentate line, completely obstructing ? CT abdomen/pelvis 09/14/2017-sigmoid thickening no evidence of metastatic disease ? 2 mm right upper lobe nodule on CT 08/04/2017 ? Colonoscopy 02/07/2018-polyps removed from the cecum, ascending colon, transverse colon, descending colon, rectosigmoid colon and rectum (tubular adenomas)  2. Right lung pneumonia/empyema March 2019  Right VATS, drainage of empyema, and decortication of the right lower lobe 08/31/2017  3.CVA in 2010  4.Rectal and active sigmoid polyps noted on flexible sigmoidoscopy 09/18/2017  5.Pancreas tail mass/right liver lesions and 4 mm splenic lesion on CT abdomen/pelvis 05/27/2018  MRI abdomen 07/27/2018-right liver lesions, rim-enhancing lesion in the pancreas tail  Ultrasound-guided biopsy of a right liver lesion 08/14/2018-metastatic adenocarcinoma, cytokeratin 7+, CDX-2 weak positive, cytokeratin 20 negative, consistent with metastatic pancreas cancer; MSI stable; tumor mutational burden 0  CT abdomen/pelvis 09/26/2018-increased size of previously noted liver lesions, new right liver lesion, 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  Cycle 6 gemcitabine/Abraxane 12/20/2018  Cycle 7 gemcitabine/Abraxane 01/03/2019  Cycle 8 gemcitabine/Abraxane 01/17/2019  Cycle 9 gemcitabine/Abraxane 01/31/2019  Cycle 10 gemcitabine/Abraxane 02/13/2019  Cycle 11  gemcitabine/Abraxane 02/28/2019 (Abraxane held secondary to neuropathy symptoms)  Cycle 12 gemcitabine/Abraxane 03/14/2019 (Abraxane held due to persistent neuropathy symptoms)  Cycle 13 gemcitabine/Abraxane 03/28/2019 (Abraxane held due to persistent neuropathy symptoms)  Cycle 14 gemcitabine/Abraxane 04/11/2019 (Abraxane held due to persistent neuropathy symptoms)  CT abdomen/pelvis 04/28/2019-decrease in size of pancreatic mass. Decrease in size of hepatic metastatic foci.   Cycle 15 gemcitabine12/05/2018(Abraxane held due to neuropathy symptoms)  Cycle 16 gemcitabine 05/16/2019 (Abraxane held due to neuropathy symptoms)  Cycle 17 gemcitabine 05/31/2019 (Abraxane on hold due to neuropathy symptoms)  Cycle 18 gemcitabine 06/13/2019 (Abraxane held)  Cycle 19 gemcitabine 06/27/2019 (Abraxane held)  Cycle 20 gemcitabine 07/11/2019 (Abraxane held)  Cycle 21 gemcitabine 07/25/2019 (Abraxane held)  CTabdomen/pelvis 08/05/2019-stable small liver lesions, stable pancreas mass, no evidence of disease progression  Cycle 22 gemcitabine 08/08/2019 (Abraxane held)  Cycle 23 gemcitabine 08/22/2019 (Abraxane held)  Cycle 24 gemcitabine 09/05/2019 (Abraxane held)  Cycle 25 gemcitabine 09/19/2019 (Abraxane held)  CT abdomen/pelvis 09/30/2019-no change in pancreas mass, slight enlargement of right liver metastases, largest measuring 1 cm, no new lesions  Cycle 26 gemcitabine/Abraxane 10/03/2019-Abraxane resumed at a dose of 100 mg/m  Cycle 27 gemcitabine/Abraxane 10/17/2019  6.Port-A-Cath placement by Dr. 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  He will complete another treatment with gemcitabine/Abraxane today.  He will return for an office visit and chemotherapy in 2 weeks.  We will continue close follow-up of the CA 19-9 and decide on continuing gemcitabine/Abraxane versus changing to a different systemic therapy  regimen.  Betsy Coder, MD  10/17/2019  8:55 AM

## 2019-10-17 NOTE — Progress Notes (Signed)
Patient has been approved for drug assistance by [employer] for Abraxane. The enrollment period is thru 05/28/20 based on self pay (Medicare A only). First DOS covered is 10/03/19.

## 2019-10-18 LAB — CANCER ANTIGEN 19-9: CA 19-9: 90 U/mL — ABNORMAL HIGH (ref 0–35)

## 2019-10-20 ENCOUNTER — Telehealth: Payer: Self-pay | Admitting: Oncology

## 2019-10-20 NOTE — Telephone Encounter (Signed)
Scheduled per los. Called and left msg. Mailed printout  °

## 2019-10-24 NOTE — Progress Notes (Signed)
Pharmacist Chemotherapy Monitoring - Follow Up Assessment    I verify that I have reviewed each item in the below checklist:  . Regimen for the patient is scheduled for the appropriate day and plan matches scheduled date. Marland Kitchen Appropriate non-routine labs are ordered dependent on drug ordered. . If applicable, additional medications reviewed and ordered per protocol based on lifetime cumulative doses and/or treatment regimen.   Plan for follow-up and/or issues identified: No . I-vent associated with next due treatment: No . MD and/or nursing notified: No  Alex [name] D 10/24/2019 3:41 PM

## 2019-10-26 ENCOUNTER — Other Ambulatory Visit: Payer: Self-pay | Admitting: Oncology

## 2019-10-31 ENCOUNTER — Inpatient Hospital Stay: Payer: Self-pay | Attending: Nurse Practitioner

## 2019-10-31 ENCOUNTER — Encounter: Payer: Self-pay | Admitting: Nurse Practitioner

## 2019-10-31 ENCOUNTER — Inpatient Hospital Stay: Payer: Self-pay

## 2019-10-31 ENCOUNTER — Other Ambulatory Visit: Payer: Self-pay

## 2019-10-31 ENCOUNTER — Inpatient Hospital Stay (HOSPITAL_BASED_OUTPATIENT_CLINIC_OR_DEPARTMENT_OTHER): Payer: Self-pay | Admitting: Nurse Practitioner

## 2019-10-31 VITALS — BP 131/77 | HR 66 | Temp 98.1°F | Resp 18 | Ht 66.0 in | Wt 215.1 lb

## 2019-10-31 DIAGNOSIS — C252 Malignant neoplasm of tail of pancreas: Secondary | ICD-10-CM

## 2019-10-31 DIAGNOSIS — Z5111 Encounter for antineoplastic chemotherapy: Secondary | ICD-10-CM | POA: Insufficient documentation

## 2019-10-31 DIAGNOSIS — Z95828 Presence of other vascular implants and grafts: Secondary | ICD-10-CM

## 2019-10-31 DIAGNOSIS — C787 Secondary malignant neoplasm of liver and intrahepatic bile duct: Secondary | ICD-10-CM | POA: Insufficient documentation

## 2019-10-31 LAB — CBC WITH DIFFERENTIAL (CANCER CENTER ONLY)
Abs Immature Granulocytes: 0.03 10*3/uL (ref 0.00–0.07)
Basophils Absolute: 0 10*3/uL (ref 0.0–0.1)
Basophils Relative: 1 %
Eosinophils Absolute: 0.5 10*3/uL (ref 0.0–0.5)
Eosinophils Relative: 11 %
HCT: 41.5 % (ref 39.0–52.0)
Hemoglobin: 13.2 g/dL (ref 13.0–17.0)
Immature Granulocytes: 1 %
Lymphocytes Relative: 17 %
Lymphs Abs: 0.8 10*3/uL (ref 0.7–4.0)
MCH: 29.5 pg (ref 26.0–34.0)
MCHC: 31.8 g/dL (ref 30.0–36.0)
MCV: 92.6 fL (ref 80.0–100.0)
Monocytes Absolute: 1.1 10*3/uL — ABNORMAL HIGH (ref 0.1–1.0)
Monocytes Relative: 23 %
Neutro Abs: 2.3 10*3/uL (ref 1.7–7.7)
Neutrophils Relative %: 47 %
Platelet Count: 147 10*3/uL — ABNORMAL LOW (ref 150–400)
RBC: 4.48 MIL/uL (ref 4.22–5.81)
RDW: 14.4 % (ref 11.5–15.5)
WBC Count: 4.9 10*3/uL (ref 4.0–10.5)
nRBC: 0 % (ref 0.0–0.2)

## 2019-10-31 LAB — CMP (CANCER CENTER ONLY)
ALT: 31 U/L (ref 0–44)
AST: 53 U/L — ABNORMAL HIGH (ref 15–41)
Albumin: 2.9 g/dL — ABNORMAL LOW (ref 3.5–5.0)
Alkaline Phosphatase: 68 U/L (ref 38–126)
Anion gap: 10 (ref 5–15)
BUN: 12 mg/dL (ref 8–23)
CO2: 26 mmol/L (ref 22–32)
Calcium: 8.6 mg/dL — ABNORMAL LOW (ref 8.9–10.3)
Chloride: 104 mmol/L (ref 98–111)
Creatinine: 0.85 mg/dL (ref 0.61–1.24)
GFR, Est AFR Am: >60 mL/min (ref >60)
GFR, Estimated: >60 mL/min (ref >60)
Glucose, Bld: 129 mg/dL — ABNORMAL HIGH (ref 70–99)
Potassium: 3.9 mmol/L (ref 3.5–5.1)
Sodium: 140 mmol/L (ref 135–145)
Total Bilirubin: 0.5 mg/dL (ref 0.3–1.2)
Total Protein: 6 g/dL — ABNORMAL LOW (ref 6.5–8.1)

## 2019-10-31 MED ORDER — ONDANSETRON HCL 8 MG PO TABS
ORAL_TABLET | ORAL | Status: AC
  Filled 2019-10-31: qty 1

## 2019-10-31 MED ORDER — ONDANSETRON HCL 8 MG PO TABS
8.0000 mg | ORAL_TABLET | Freq: Once | ORAL | Status: AC
  Administered 2019-10-31: 8 mg via ORAL

## 2019-10-31 MED ORDER — HEPARIN SOD (PORK) LOCK FLUSH 100 UNIT/ML IV SOLN
500.0000 [IU] | Freq: Once | INTRAVENOUS | Status: AC | PRN
  Administered 2019-10-31: 500 [IU]
  Filled 2019-10-31: qty 5

## 2019-10-31 MED ORDER — PROCHLORPERAZINE MALEATE 10 MG PO TABS
ORAL_TABLET | ORAL | Status: AC
  Filled 2019-10-31: qty 1

## 2019-10-31 MED ORDER — SODIUM CHLORIDE 0.9% FLUSH
10.0000 mL | INTRAVENOUS | Status: DC | PRN
  Administered 2019-10-31: 10 mL
  Filled 2019-10-31: qty 10

## 2019-10-31 MED ORDER — SODIUM CHLORIDE 0.9 % IV SOLN
2000.0000 mg | Freq: Once | INTRAVENOUS | Status: AC
  Administered 2019-10-31: 2000 mg via INTRAVENOUS
  Filled 2019-10-31: qty 52.6

## 2019-10-31 MED ORDER — PROCHLORPERAZINE MALEATE 10 MG PO TABS
10.0000 mg | ORAL_TABLET | Freq: Once | ORAL | Status: AC
  Administered 2019-10-31: 10 mg via ORAL

## 2019-10-31 MED ORDER — PACLITAXEL PROTEIN-BOUND CHEMO INJECTION 100 MG
100.0000 mg/m2 | Freq: Once | INTRAVENOUS | Status: AC
  Administered 2019-10-31: 200 mg via INTRAVENOUS
  Filled 2019-10-31: qty 40

## 2019-10-31 MED ORDER — LIDOCAINE-PRILOCAINE 2.5-2.5 % EX CREA: 1.0000 "application " | TOPICAL_CREAM | CUTANEOUS | 1 refills | Status: AC | PRN

## 2019-10-31 MED ORDER — SODIUM CHLORIDE 0.9 % IV SOLN
Freq: Once | INTRAVENOUS | Status: AC
  Filled 2019-10-31: qty 250

## 2019-10-31 NOTE — Patient Instructions (Signed)
[facility] Discharge Instructions for Patients Receiving Chemotherapy  Today you received the following chemotherapy agents abraxane and gemzar.  To help prevent nausea and vomiting after your treatment, we encourage you to take your nausea medication as directed.   If you develop nausea and vomiting that is not controlled by your nausea medication, call the clinic.   BELOW ARE SYMPTOMS THAT SHOULD BE REPORTED IMMEDIATELY:  *FEVER GREATER THAN 100.5 F  *CHILLS WITH OR WITHOUT FEVER  NAUSEA AND VOMITING THAT IS NOT CONTROLLED WITH YOUR NAUSEA MEDICATION  *UNUSUAL SHORTNESS OF BREATH  *UNUSUAL BRUISING OR BLEEDING  TENDERNESS IN MOUTH AND THROAT WITH OR WITHOUT PRESENCE OF ULCERS  *URINARY PROBLEMS  *BOWEL PROBLEMS  UNUSUAL RASH Items with * indicate a potential emergency and should be followed up as soon as possible.  Feel free to call the clinic should you have any questions or concerns. The clinic phone number is [phone].  Please show the CHEMO ALERT CARD at check-in to the Emergency Department and triage nurse.   

## 2019-10-31 NOTE — Progress Notes (Signed)
[place] OFFICE PROGRESS NOTE   Diagnosis: Pancreas cancer  INTERVAL HISTORY:   Alex [name] returns as scheduled.  He completed a cycle of gemcitabine/Abraxane 10/17/2019.  He denies nausea/vomiting.  No mouth sores.  No diarrhea.  He denies abdominal pain.  No fever or rash following treatment.  Stable neuropathy symptoms involving the hands and feet.  He notes an alteration in taste since resuming Abraxane.  Objective:  Vital signs in last 24 hours:  Blood pressure 131/77, pulse 66, temperature 98.1 F (36.7 C), temperature source Temporal, resp. rate 18, height 5' 6" (1.676 m), weight 215 lb 1.6 oz (97.6 kg), SpO2 98 %.    HEENT: No thrush or ulcers. Resp: Breath sounds diminished right lower lung field, faint rales.  No respiratory distress. Cardio: Regular rate and rhythm. GI: Abdomen soft and nontender.  No hepatomegaly. Vascular: Trace bilateral pretibial edema.  Right lower leg is slightly larger than the left lower leg. Port-A-Cath without erythema.  Lab Results:  Lab Results  Component Value Date   WBC 4.9 10/31/2019   HGB 13.2 10/31/2019   HCT 41.5 10/31/2019   MCV 92.6 10/31/2019   PLT 147 (L) 10/31/2019   NEUTROABS 2.3 10/31/2019    Imaging:  No results found.  Medications: I have reviewed the patient's current medications.  Assessment/Plan: 1. 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 Well-differentiated adenocarcinoma, 0/13 lymph nodes positive, MSI-stable, no loss of mismatch repair protein expression ? Tumor involving the rectosigmoid junction ? Sigmoidoscopy 09/18/2017-distal sigmoid colon tumor beginning between 15 and 20 cm from the dentate line, completely obstructing ? CT abdomen/pelvis 09/14/2017-sigmoid thickening no evidence of metastatic disease ? 2 mm right upper lobe nodule on CT 08/04/2017 ? Colonoscopy 02/07/2018-polyps removed from the cecum,  ascending colon, transverse colon, descending colon, rectosigmoid colon and rectum (tubular adenomas)  2. Right lung pneumonia/empyema March 2019  Right VATS, drainage of empyema, and decortication of the right lower lobe 08/31/2017  3.CVA in 2010  4.Rectal and active sigmoid polyps noted on flexible sigmoidoscopy 09/18/2017  5.Pancreas tail mass/right liver lesions and 4 mm splenic lesion on CT abdomen/pelvis 05/27/2018  MRI abdomen 07/27/2018-right liver lesions, rim-enhancing lesion in the pancreas tail  Ultrasound-guided biopsy of a right liver lesion 08/14/2018-metastatic adenocarcinoma, cytokeratin 7+, CDX-2 weak positive, cytokeratin 20 negative, consistent with metastatic pancreas cancer; MSI stable; tumor mutational burden 0  CT abdomen/pelvis 09/26/2018-increased size of previously noted liver lesions, new right liver lesion, 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  Cycle 6 gemcitabine/Abraxane 12/20/2018  Cycle 7 gemcitabine/Abraxane 01/03/2019  Cycle 8 gemcitabine/Abraxane 01/17/2019  Cycle 9 gemcitabine/Abraxane 01/31/2019  Cycle 10 gemcitabine/Abraxane 02/13/2019  Cycle 11 gemcitabine/Abraxane 02/28/2019 (Abraxane held secondary to neuropathy symptoms)  Cycle 12 gemcitabine/Abraxane 03/14/2019 (Abraxane held due to persistent neuropathy symptoms)  Cycle 13 gemcitabine/Abraxane 03/28/2019 (Abraxane held due to persistent neuropathy symptoms)  Cycle 14 gemcitabine/Abraxane 04/11/2019 (Abraxane held due to persistent neuropathy symptoms)  CT abdomen/pelvis 04/28/2019-decrease in size of  pancreatic mass. Decrease in size of hepatic metastatic foci.   Cycle 15 gemcitabine12/05/2018(Abraxane held due to neuropathy symptoms)  Cycle 16 gemcitabine 05/16/2019 (Abraxane held due to neuropathy symptoms)  Cycle 17 gemcitabine 05/31/2019 (  Abraxane on hold due to neuropathy symptoms)  Cycle 18 gemcitabine 06/13/2019 (Abraxane held)  Cycle 19 gemcitabine 06/27/2019 (Abraxane held)  Cycle 20 gemcitabine 07/11/2019 (Abraxane held)  Cycle 21 gemcitabine 07/25/2019 (Abraxane held)  CTabdomen/pelvis 08/05/2019-stable small liver lesions, stable pancreas mass, no evidence of disease progression  Cycle 22 gemcitabine 08/08/2019 (Abraxane held)  Cycle 23 gemcitabine 08/22/2019 (Abraxane held)  Cycle 24 gemcitabine 09/05/2019 (Abraxane held)  Cycle 25 gemcitabine 09/19/2019 (Abraxane held)  CT abdomen/pelvis 09/30/2019-no change in pancreas mass, slight enlargement of right liver metastases, largest measuring 1 cm, no new lesions  Cycle 26 gemcitabine/Abraxane 10/03/2019-Abraxane resumed at a dose of 100 mg/m  Cycle 27 gemcitabine/Abraxane 10/17/2019  Cycle 28 gemcitabine/Abraxane 10/31/2019  6.Port-A-Cath placement by Dr. 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  There is no clinical evidence of disease progression.  Plan to proceed with gemcitabine/Abraxane today as scheduled.  We will follow-up on the CA 19-9.  We reviewed the CBC from today.  Counts adequate to proceed as above.  He will return for lab, follow-up, gemcitabine/Abraxane in 2 weeks.  He will contact the office in the interim with any problems.    Ned Card ANP/GNP-BC   10/31/2019  8:54 AM

## 2019-10-31 NOTE — Patient Instructions (Signed)

## 2019-11-01 LAB — CANCER ANTIGEN 19-9: CA 19-9: 67 U/mL — ABNORMAL HIGH (ref 0–35)

## 2019-11-09 ENCOUNTER — Other Ambulatory Visit: Payer: Self-pay | Admitting: Oncology

## 2019-11-14 ENCOUNTER — Inpatient Hospital Stay: Payer: Self-pay

## 2019-11-14 ENCOUNTER — Inpatient Hospital Stay (HOSPITAL_BASED_OUTPATIENT_CLINIC_OR_DEPARTMENT_OTHER): Payer: Self-pay | Admitting: Oncology

## 2019-11-14 ENCOUNTER — Other Ambulatory Visit: Payer: Self-pay

## 2019-11-14 VITALS — BP 148/89 | HR 78 | Temp 99.1°F | Resp 16 | Ht 66.0 in | Wt 210.6 lb

## 2019-11-14 DIAGNOSIS — C252 Malignant neoplasm of tail of pancreas: Secondary | ICD-10-CM

## 2019-11-14 DIAGNOSIS — Z95828 Presence of other vascular implants and grafts: Secondary | ICD-10-CM

## 2019-11-14 LAB — CMP (CANCER CENTER ONLY)
ALT: 33 U/L (ref 0–44)
AST: 66 U/L — ABNORMAL HIGH (ref 15–41)
Albumin: 2.9 g/dL — ABNORMAL LOW (ref 3.5–5.0)
Alkaline Phosphatase: 75 U/L (ref 38–126)
Anion gap: 8 (ref 5–15)
BUN: 13 mg/dL (ref 8–23)
CO2: 26 mmol/L (ref 22–32)
Calcium: 8.2 mg/dL — ABNORMAL LOW (ref 8.9–10.3)
Chloride: 104 mmol/L (ref 98–111)
Creatinine: 0.9 mg/dL (ref 0.61–1.24)
GFR, Est AFR Am: >60 mL/min (ref >60)
GFR, Estimated: >60 mL/min (ref >60)
Glucose, Bld: 165 mg/dL — ABNORMAL HIGH (ref 70–99)
Potassium: 3.6 mmol/L (ref 3.5–5.1)
Sodium: 138 mmol/L (ref 135–145)
Total Bilirubin: 0.7 mg/dL (ref 0.3–1.2)
Total Protein: 6.1 g/dL — ABNORMAL LOW (ref 6.5–8.1)

## 2019-11-14 LAB — CBC WITH DIFFERENTIAL (CANCER CENTER ONLY)
Abs Immature Granulocytes: 0.02 10*3/uL (ref 0.00–0.07)
Basophils Absolute: 0.1 10*3/uL (ref 0.0–0.1)
Basophils Relative: 1 %
Eosinophils Absolute: 0.3 10*3/uL (ref 0.0–0.5)
Eosinophils Relative: 6 %
HCT: 41.4 % (ref 39.0–52.0)
Hemoglobin: 13.1 g/dL (ref 13.0–17.0)
Immature Granulocytes: 1 %
Lymphocytes Relative: 20 %
Lymphs Abs: 0.8 10*3/uL (ref 0.7–4.0)
MCH: 28.7 pg (ref 26.0–34.0)
MCHC: 31.6 g/dL (ref 30.0–36.0)
MCV: 90.6 fL (ref 80.0–100.0)
Monocytes Absolute: 0.9 10*3/uL (ref 0.1–1.0)
Monocytes Relative: 23 %
Neutro Abs: 1.9 10*3/uL (ref 1.7–7.7)
Neutrophils Relative %: 49 %
Platelet Count: 159 10*3/uL (ref 150–400)
RBC: 4.57 MIL/uL (ref 4.22–5.81)
RDW: 13.9 % (ref 11.5–15.5)
WBC Count: 3.9 10*3/uL — ABNORMAL LOW (ref 4.0–10.5)
nRBC: 0 % (ref 0.0–0.2)

## 2019-11-14 MED ORDER — SODIUM CHLORIDE 0.9% FLUSH
10.0000 mL | INTRAVENOUS | Status: DC | PRN
  Administered 2019-11-14: 10 mL
  Filled 2019-11-14: qty 10

## 2019-11-14 MED ORDER — PROCHLORPERAZINE MALEATE 10 MG PO TABS
ORAL_TABLET | ORAL | Status: AC
  Filled 2019-11-14: qty 1

## 2019-11-14 MED ORDER — PACLITAXEL PROTEIN-BOUND CHEMO INJECTION 100 MG
100.0000 mg/m2 | Freq: Once | INTRAVENOUS | Status: AC
  Administered 2019-11-14: 200 mg via INTRAVENOUS
  Filled 2019-11-14: qty 40

## 2019-11-14 MED ORDER — ALTEPLASE 2 MG IJ SOLR
2.0000 mg | Freq: Once | INTRAMUSCULAR | Status: DC | PRN
  Filled 2019-11-14: qty 2

## 2019-11-14 MED ORDER — PROCHLORPERAZINE MALEATE 10 MG PO TABS
10.0000 mg | ORAL_TABLET | Freq: Once | ORAL | Status: AC
  Administered 2019-11-14: 10 mg via ORAL

## 2019-11-14 MED ORDER — HEPARIN SOD (PORK) LOCK FLUSH 100 UNIT/ML IV SOLN
500.0000 [IU] | Freq: Once | INTRAVENOUS | Status: AC | PRN
  Administered 2019-11-14: 500 [IU]
  Filled 2019-11-14: qty 5

## 2019-11-14 MED ORDER — HEPARIN SOD (PORK) LOCK FLUSH 100 UNIT/ML IV SOLN
500.0000 [IU] | Freq: Once | INTRAVENOUS | Status: DC | PRN
  Filled 2019-11-14: qty 5

## 2019-11-14 MED ORDER — SODIUM CHLORIDE 0.9 % IV SOLN
2000.0000 mg | Freq: Once | INTRAVENOUS | Status: AC
  Administered 2019-11-14: 2000 mg via INTRAVENOUS
  Filled 2019-11-14: qty 52.6

## 2019-11-14 MED ORDER — SODIUM CHLORIDE 0.9 % IV SOLN
Freq: Once | INTRAVENOUS | Status: AC
  Filled 2019-11-14: qty 250

## 2019-11-14 NOTE — Patient Instructions (Signed)
[facility] Discharge Instructions for Patients Receiving Chemotherapy  Today you received the following chemotherapy agents abraxane and gemzar.  To help prevent nausea and vomiting after your treatment, we encourage you to take your nausea medication as directed.   If you develop nausea and vomiting that is not controlled by your nausea medication, call the clinic.   BELOW ARE SYMPTOMS THAT SHOULD BE REPORTED IMMEDIATELY:  *FEVER GREATER THAN 100.5 F  *CHILLS WITH OR WITHOUT FEVER  NAUSEA AND VOMITING THAT IS NOT CONTROLLED WITH YOUR NAUSEA MEDICATION  *UNUSUAL SHORTNESS OF BREATH  *UNUSUAL BRUISING OR BLEEDING  TENDERNESS IN MOUTH AND THROAT WITH OR WITHOUT PRESENCE OF ULCERS  *URINARY PROBLEMS  *BOWEL PROBLEMS  UNUSUAL RASH Items with * indicate a potential emergency and should be followed up as soon as possible.  Feel free to call the clinic should you have any questions or concerns. The clinic phone number is [phone].  Please show the CHEMO ALERT CARD at check-in to the Emergency Department and triage nurse.   

## 2019-11-14 NOTE — Progress Notes (Signed)
[place] [facility] OFFICE PROGRESS NOTE   Diagnosis: Pancreas cancer  INTERVAL HISTORY:  [name] completed another cycle of gemcitabine/Abraxane on 10/31/2019.  He reports altered taste.  He relates weight loss to the taste change.  No change in neuropathy symptoms.  No other complaint.   Objective:  Vital signs in last 24 hours:  Blood pressure (!) 148/89, pulse 78, temperature 99.1 F (37.3 C), temperature source Temporal, resp. rate 16, height '5\' 6"'  (1.676 m), weight 210 lb 9.6 oz (95.5 kg), SpO2 98 %.    HEENT: No thrush or ulcers Resp: Decreased breath sounds with inspiratory rhonchi at the right base, no respiratory distress Cardio: Regular rate and rhythm GI: No hepatosplenomegaly, nontender, left lower quadrant colostomy Vascular: Trace edema to left lower leg   Portacath/PICC-without erythema  Lab Results:  Lab Results  Component Value Date   WBC 3.9 (L) 11/14/2019   HGB 13.1 11/14/2019   HCT 41.4 11/14/2019   MCV 90.6 11/14/2019   PLT 159 11/14/2019   NEUTROABS 1.9 11/14/2019    CMP  Lab Results  Component Value Date   NA 138 11/14/2019   K 3.6 11/14/2019   CL 104 11/14/2019   CO2 26 11/14/2019   GLUCOSE 165 (H) 11/14/2019   BUN 13 11/14/2019   CREATININE 0.90 11/14/2019   CALCIUM 8.2 (L) 11/14/2019   PROT 6.1 (L) 11/14/2019   ALBUMIN 2.9 (L) 11/14/2019   AST 66 (H) 11/14/2019   ALT 33 11/14/2019   ALKPHOS 75 11/14/2019   BILITOT 0.7 11/14/2019   GFRNONAA >60 11/14/2019   GFRAA >60 11/14/2019     Medications: I have reviewed the patient's current medications.   Assessment/Plan: 1. 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 Well-differentiated adenocarcinoma, 0/13 lymph nodes positive, MSI-stable, no loss of mismatch repair protein expression ? Tumor involving the rectosigmoid junction ? Sigmoidoscopy 09/18/2017-distal sigmoid colon tumor beginning  between 15 and 20 cm from the dentate line, completely obstructing ? CT abdomen/pelvis 09/14/2017-sigmoid thickening no evidence of metastatic disease ? 2 mm right upper lobe nodule on CT 08/04/2017 ? Colonoscopy 02/07/2018-polyps removed from the cecum, ascending colon, transverse colon, descending colon, rectosigmoid colon and rectum (tubular adenomas)  2. Right lung pneumonia/empyema March 2019  Right VATS, drainage of empyema, and decortication of the right lower lobe 08/31/2017  3.CVA in 2010  4.Rectal and active sigmoid polyps noted on flexible sigmoidoscopy 09/18/2017  5.Pancreas tail mass/right liver lesions and 4 mm splenic lesion on CT abdomen/pelvis 05/27/2018  MRI abdomen 07/27/2018-right liver lesions, rim-enhancing lesion in the pancreas tail  Ultrasound-guided biopsy of a right liver lesion 08/14/2018-metastatic adenocarcinoma, cytokeratin 7+, CDX-2 weak positive, cytokeratin 20 negative, consistent with metastatic pancreas cancer; MSI stable; tumor mutational burden 0  CT abdomen/pelvis 09/26/2018-increased size of previously noted liver lesions, new right liver lesion, 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  Cycle 6 gemcitabine/Abraxane 12/20/2018  Cycle 7 gemcitabine/Abraxane 01/03/2019  Cycle 8 gemcitabine/Abraxane 01/17/2019  Cycle 9 gemcitabine/Abraxane 01/31/2019  Cycle 10 gemcitabine/Abraxane 02/13/2019  Cycle 11 gemcitabine/Abraxane 02/28/2019 (Abraxane held secondary to neuropathy symptoms)  Cycle 12 gemcitabine/Abraxane 03/14/2019 (Abraxane held due to persistent neuropathy  symptoms)  Cycle 13 gemcitabine/Abraxane  03/28/2019 (Abraxane held due to persistent neuropathy symptoms)  Cycle 14 gemcitabine/Abraxane 04/11/2019 (Abraxane held due to persistent neuropathy symptoms)  CT abdomen/pelvis 04/28/2019-decrease in size of pancreatic mass. Decrease in size of hepatic metastatic foci.   Cycle 15 gemcitabine12/05/2018(Abraxane held due to neuropathy symptoms)  Cycle 16 gemcitabine 05/16/2019 (Abraxane held due to neuropathy symptoms)  Cycle 17 gemcitabine 05/31/2019 (Abraxane on hold due to neuropathy symptoms)  Cycle 18 gemcitabine 06/13/2019 (Abraxane held)  Cycle 19 gemcitabine 06/27/2019 (Abraxane held)  Cycle 20 gemcitabine 07/11/2019 (Abraxane held)  Cycle 21 gemcitabine 07/25/2019 (Abraxane held)  CTabdomen/pelvis 08/05/2019-stable small liver lesions, stable pancreas mass, no evidence of disease progression  Cycle 22 gemcitabine 08/08/2019 (Abraxane held)  Cycle 23 gemcitabine 08/22/2019 (Abraxane held)  Cycle 24 gemcitabine 09/05/2019 (Abraxane held)  Cycle 25 gemcitabine 09/19/2019 (Abraxane held)  CT abdomen/pelvis 09/30/2019-no change in pancreas mass, slight enlargement of right liver metastases, largest measuring 1 cm, no new lesions  Cycle 26 gemcitabine/Abraxane 10/03/2019-Abraxane resumed at a dose of 100 mg/m  Cycle 27 gemcitabine/Abraxane 10/17/2019  Cycle 28 gemcitabine/Abraxane 10/31/2019  Cycle 29 gemcitabine/Abraxane 11/14/2019  6.Port-A-Cath placement by Dr. 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 appears unchanged.  The altered taste is likely secondary to chemotherapy.  He is not appear to have mucositis or oral candidiasis.  The CA 19-9 was lower on 10/31/2019.  We will follow up on the CA 19-9 from today.  He will complete another treatment with gemcitabine/Abraxane today.  [name] will return for an office visit and chemotherapy in 2 weeks.  Betsy Coder, MD  11/14/2019   9:29 AM

## 2019-11-15 LAB — CANCER ANTIGEN 19-9: CA 19-9: 53 U/mL — ABNORMAL HIGH (ref 0–35)

## 2019-11-17 ENCOUNTER — Telehealth: Payer: Self-pay | Admitting: Oncology

## 2019-11-17 NOTE — Telephone Encounter (Signed)
Scheduled appts per 6/18 los. Left voicemail with appt date and time.

## 2019-11-23 ENCOUNTER — Other Ambulatory Visit: Payer: Self-pay | Admitting: Oncology

## 2019-11-28 ENCOUNTER — Inpatient Hospital Stay: Payer: Medicare Other

## 2019-11-28 ENCOUNTER — Encounter: Payer: Self-pay | Admitting: Nurse Practitioner

## 2019-11-28 ENCOUNTER — Other Ambulatory Visit: Payer: Self-pay

## 2019-11-28 ENCOUNTER — Inpatient Hospital Stay: Payer: Medicare Other | Attending: Nurse Practitioner

## 2019-11-28 ENCOUNTER — Inpatient Hospital Stay (HOSPITAL_BASED_OUTPATIENT_CLINIC_OR_DEPARTMENT_OTHER): Payer: Medicare Other | Admitting: Nurse Practitioner

## 2019-11-28 VITALS — BP 135/68 | HR 65 | Temp 97.7°F | Resp 18 | Ht 66.0 in | Wt 208.5 lb

## 2019-11-28 DIAGNOSIS — C252 Malignant neoplasm of tail of pancreas: Secondary | ICD-10-CM | POA: Diagnosis present

## 2019-11-28 DIAGNOSIS — C787 Secondary malignant neoplasm of liver and intrahepatic bile duct: Secondary | ICD-10-CM | POA: Diagnosis not present

## 2019-11-28 DIAGNOSIS — T451X5A Adverse effect of antineoplastic and immunosuppressive drugs, initial encounter: Secondary | ICD-10-CM | POA: Insufficient documentation

## 2019-11-28 DIAGNOSIS — G629 Polyneuropathy, unspecified: Secondary | ICD-10-CM | POA: Insufficient documentation

## 2019-11-28 DIAGNOSIS — R200 Anesthesia of skin: Secondary | ICD-10-CM | POA: Diagnosis not present

## 2019-11-28 DIAGNOSIS — Z8673 Personal history of transient ischemic attack (TIA), and cerebral infarction without residual deficits: Secondary | ICD-10-CM | POA: Insufficient documentation

## 2019-11-28 DIAGNOSIS — D701 Agranulocytosis secondary to cancer chemotherapy: Secondary | ICD-10-CM | POA: Diagnosis not present

## 2019-11-28 DIAGNOSIS — Z5111 Encounter for antineoplastic chemotherapy: Secondary | ICD-10-CM | POA: Insufficient documentation

## 2019-11-28 LAB — CMP (CANCER CENTER ONLY)
ALT: 38 U/L (ref 0–44)
AST: 68 U/L — ABNORMAL HIGH (ref 15–41)
Albumin: 3.2 g/dL — ABNORMAL LOW (ref 3.5–5.0)
Alkaline Phosphatase: 78 U/L (ref 38–126)
Anion gap: 9 (ref 5–15)
BUN: 14 mg/dL (ref 8–23)
CO2: 26 mmol/L (ref 22–32)
Calcium: 8.8 mg/dL — ABNORMAL LOW (ref 8.9–10.3)
Chloride: 104 mmol/L (ref 98–111)
Creatinine: 0.89 mg/dL (ref 0.61–1.24)
GFR, Est AFR Am: >60 mL/min (ref >60)
GFR, Estimated: >60 mL/min (ref >60)
Glucose, Bld: 105 mg/dL — ABNORMAL HIGH (ref 70–99)
Potassium: 4 mmol/L (ref 3.5–5.1)
Sodium: 139 mmol/L (ref 135–145)
Total Bilirubin: 0.7 mg/dL (ref 0.3–1.2)
Total Protein: 6.6 g/dL (ref 6.5–8.1)

## 2019-11-28 LAB — CBC WITH DIFFERENTIAL (CANCER CENTER ONLY)
Abs Immature Granulocytes: 0.02 10*3/uL (ref 0.00–0.07)
Basophils Absolute: 0.1 10*3/uL (ref 0.0–0.1)
Basophils Relative: 1 %
Eosinophils Absolute: 0.2 10*3/uL (ref 0.0–0.5)
Eosinophils Relative: 4 %
HCT: 42 % (ref 39.0–52.0)
Hemoglobin: 13.4 g/dL (ref 13.0–17.0)
Immature Granulocytes: 0 %
Lymphocytes Relative: 20 %
Lymphs Abs: 1 10*3/uL (ref 0.7–4.0)
MCH: 28.8 pg (ref 26.0–34.0)
MCHC: 31.9 g/dL (ref 30.0–36.0)
MCV: 90.1 fL (ref 80.0–100.0)
Monocytes Absolute: 1.2 10*3/uL — ABNORMAL HIGH (ref 0.1–1.0)
Monocytes Relative: 23 %
Neutro Abs: 2.7 10*3/uL (ref 1.7–7.7)
Neutrophils Relative %: 52 %
Platelet Count: 148 10*3/uL — ABNORMAL LOW (ref 150–400)
RBC: 4.66 MIL/uL (ref 4.22–5.81)
RDW: 14 % (ref 11.5–15.5)
WBC Count: 5.2 10*3/uL (ref 4.0–10.5)
nRBC: 0 % (ref 0.0–0.2)

## 2019-11-28 MED ORDER — SODIUM CHLORIDE 0.9% FLUSH
10.0000 mL | INTRAVENOUS | Status: DC | PRN
  Administered 2019-11-28: 10 mL
  Filled 2019-11-28: qty 10

## 2019-11-28 MED ORDER — PROCHLORPERAZINE MALEATE 10 MG PO TABS
10.0000 mg | ORAL_TABLET | Freq: Once | ORAL | Status: AC
  Administered 2019-11-28: 10 mg via ORAL

## 2019-11-28 MED ORDER — HEPARIN SOD (PORK) LOCK FLUSH 100 UNIT/ML IV SOLN
500.0000 [IU] | Freq: Once | INTRAVENOUS | Status: AC | PRN
  Administered 2019-11-28: 500 [IU]
  Filled 2019-11-28: qty 5

## 2019-11-28 MED ORDER — PROCHLORPERAZINE MALEATE 10 MG PO TABS
ORAL_TABLET | ORAL | Status: AC
  Filled 2019-11-28: qty 1

## 2019-11-28 MED ORDER — PACLITAXEL PROTEIN-BOUND CHEMO INJECTION 100 MG
100.0000 mg/m2 | Freq: Once | INTRAVENOUS | Status: AC
  Administered 2019-11-28: 200 mg via INTRAVENOUS
  Filled 2019-11-28: qty 40

## 2019-11-28 MED ORDER — SODIUM CHLORIDE 0.9 % IV SOLN
Freq: Once | INTRAVENOUS | Status: AC
  Filled 2019-11-28: qty 250

## 2019-11-28 MED ORDER — SODIUM CHLORIDE 0.9 % IV SOLN
2000.0000 mg | Freq: Once | INTRAVENOUS | Status: AC
  Administered 2019-11-28: 2000 mg via INTRAVENOUS
  Filled 2019-11-28: qty 52.6

## 2019-11-28 NOTE — Patient Instructions (Signed)

## 2019-11-28 NOTE — Patient Instructions (Signed)
[facility] Discharge Instructions for Patients Receiving Chemotherapy  Today you received the following chemotherapy agents: Abraxane and Gemzar  To help prevent nausea and vomiting after your treatment, we encourage you to take your nausea medication as prescribed.    If you develop nausea and vomiting that is not controlled by your nausea medication, call the clinic.   BELOW ARE SYMPTOMS THAT SHOULD BE REPORTED IMMEDIATELY:  *FEVER GREATER THAN 100.5 F  *CHILLS WITH OR WITHOUT FEVER  NAUSEA AND VOMITING THAT IS NOT CONTROLLED WITH YOUR NAUSEA MEDICATION  *UNUSUAL SHORTNESS OF BREATH  *UNUSUAL BRUISING OR BLEEDING  TENDERNESS IN MOUTH AND THROAT WITH OR WITHOUT PRESENCE OF ULCERS  *URINARY PROBLEMS  *BOWEL PROBLEMS  UNUSUAL RASH Items with * indicate a potential emergency and should be followed up as soon as possible.  Feel free to call the clinic should you have any questions or concerns. The clinic phone number is [phone].  Please show the [place] at check-in to the Emergency Department and triage nurse.

## 2019-11-28 NOTE — Progress Notes (Signed)
[facility] OFFICE PROGRESS NOTE   Diagnosis: Pancreas cancer  INTERVAL HISTORY:   Alex [name] returns as scheduled.  He completed another cycle of gemcitabine/Abraxane 11/14/2019.  He denies nausea/vomiting.  No constipation or diarrhea.  No abdominal pain.  He has a good appetite.  He continues to note an alteration in taste.  Stable neuropathy symptoms in the hands and feet.  Symptoms do not interfere with activity.  Objective:  Vital signs in last 24 hours:  Blood pressure 135/68, pulse 65, temperature 97.7 F (36.5 C), temperature source Temporal, resp. rate 18, height '5\' 6"'  (1.676 m), weight 208 lb 8 oz (94.6 kg), SpO2 98 %.    HEENT: No thrush or ulcers. Resp: Decreased breath sounds with faint rhonchi at the right base.  Lungs otherwise clear.  No respiratory distress. Cardio: Regular rate and rhythm. GI: Abdomen soft and nontender.  No hepatomegaly.  Left lower quadrant colostomy. Vascular: Trace edema left lower leg. Neuro: Vibratory sense with mild to moderate decrease over the fingertips per tuning fork exam. Port-A-Cath without erythema.  Lab Results:  Lab Results  Component Value Date   WBC 5.2 11/28/2019   HGB 13.4 11/28/2019   HCT 42.0 11/28/2019   MCV 90.1 11/28/2019   PLT 148 (L) 11/28/2019   NEUTROABS 2.7 11/28/2019    Imaging:  No results found.  Medications: I have reviewed the patient's current medications.  Assessment/Plan: 1. 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 Well-differentiated adenocarcinoma, 0/13 lymph nodes positive, MSI-stable, no loss of mismatch repair protein expression ? Tumor involving the rectosigmoid junction ? Sigmoidoscopy 09/18/2017-distal sigmoid colon tumor beginning between 15 and 20 cm from the dentate line, completely obstructing ? CT abdomen/pelvis 09/14/2017-sigmoid thickening no evidence of metastatic disease ? 2 mm  right upper lobe nodule on CT 08/04/2017 ? Colonoscopy 02/07/2018-polyps removed from the cecum, ascending colon, transverse colon, descending colon, rectosigmoid colon and rectum (tubular adenomas)  2. Right lung pneumonia/empyema March 2019  Right VATS, drainage of empyema, and decortication of the right lower lobe 08/31/2017  3.CVA in 2010  4.Rectal and active sigmoid polyps noted on flexible sigmoidoscopy 09/18/2017  5.Pancreas tail mass/right liver lesions and 4 mm splenic lesion on CT abdomen/pelvis 05/27/2018  MRI abdomen 07/27/2018-right liver lesions, rim-enhancing lesion in the pancreas tail  Ultrasound-guided biopsy of a right liver lesion 08/14/2018-metastatic adenocarcinoma, cytokeratin 7+, CDX-2 weak positive, cytokeratin 20 negative, consistent with metastatic pancreas cancer; MSI stable; tumor mutational burden 0  CT abdomen/pelvis 09/26/2018-increased size of previously noted liver lesions, new right liver lesion, 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  Cycle 6 gemcitabine/Abraxane 12/20/2018  Cycle 7 gemcitabine/Abraxane 01/03/2019  Cycle 8 gemcitabine/Abraxane 01/17/2019  Cycle 9 gemcitabine/Abraxane 01/31/2019  Cycle 10 gemcitabine/Abraxane 02/13/2019  Cycle 11 gemcitabine/Abraxane 02/28/2019 (Abraxane held secondary to neuropathy symptoms)  Cycle 12 gemcitabine/Abraxane 03/14/2019 (Abraxane held due to persistent neuropathy symptoms)  Cycle 13 gemcitabine/Abraxane 03/28/2019 (Abraxane held due to persistent neuropathy symptoms)  Cycle 14 gemcitabine/Abraxane 04/11/2019 (Abraxane held  due to persistent neuropathy symptoms)  CT abdomen/pelvis 04/28/2019-decrease in size of pancreatic mass. Decrease in size of hepatic metastatic foci.   Cycle 15 gemcitabine12/05/2018(Abraxane held due to neuropathy symptoms)  Cycle 16 gemcitabine 05/16/2019 (Abraxane held due to neuropathy symptoms)  Cycle 17 gemcitabine 05/31/2019 (Abraxane on hold due to neuropathy symptoms)  Cycle 18 gemcitabine 06/13/2019 (Abraxane held)  Cycle 19 gemcitabine 06/27/2019 (Abraxane held)  Cycle 20 gemcitabine 07/11/2019 (Abraxane held)  Cycle 21 gemcitabine 07/25/2019 (Abraxane held)  CTabdomen/pelvis 08/05/2019-stable small liver lesions, stable pancreas mass, no evidence of disease progression  Cycle 22 gemcitabine 08/08/2019 (Abraxane held)  Cycle 23 gemcitabine 08/22/2019 (Abraxane held)  Cycle 24 gemcitabine 09/05/2019 (Abraxane held)  Cycle 25 gemcitabine 09/19/2019 (Abraxane held)  CT abdomen/pelvis 09/30/2019-no change in pancreas mass, slight enlargement of right liver metastases, largest measuring 1 cm, no new lesions  Cycle 26 gemcitabine/Abraxane 10/03/2019-Abraxane resumed at a dose of 100 mg/m  Cycle 27 gemcitabine/Abraxane 10/17/2019  Cycle 28 gemcitabine/Abraxane 10/31/2019  Cycle 29 gemcitabine/Abraxane 11/14/2019  6.Port-A-Cath placement by Dr. 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  He has completed 29 cycles of gemcitabine/Abraxane.  There is no clinical evidence of disease progression and the most recent CA 19 9 was further improved.  Plan to proceed with cycle 30 today as scheduled.  We reviewed the CBC from today.  Counts adequate to proceed as above.  He will return for lab, follow-up, chemotherapy in 2 weeks.    Ned Card ANP/GNP-BC   11/28/2019  9:34 AM

## 2019-11-29 LAB — CANCER ANTIGEN 19-9: CA 19-9: 53 U/mL — ABNORMAL HIGH (ref 0–35)

## 2019-12-02 ENCOUNTER — Telehealth: Payer: Self-pay | Admitting: Oncology

## 2019-12-02 NOTE — Telephone Encounter (Signed)
Scheduled per 7/2 los. Called and left a msg, mailing appt letter and calendar printout

## 2019-12-07 ENCOUNTER — Other Ambulatory Visit: Payer: Self-pay | Admitting: Oncology

## 2019-12-12 ENCOUNTER — Encounter: Payer: Self-pay | Admitting: Nurse Practitioner

## 2019-12-12 ENCOUNTER — Inpatient Hospital Stay: Payer: Medicare Other

## 2019-12-12 ENCOUNTER — Other Ambulatory Visit: Payer: Self-pay

## 2019-12-12 ENCOUNTER — Inpatient Hospital Stay (HOSPITAL_BASED_OUTPATIENT_CLINIC_OR_DEPARTMENT_OTHER): Payer: Medicare Other | Admitting: Nurse Practitioner

## 2019-12-12 VITALS — BP 117/80 | HR 64 | Temp 97.5°F | Resp 17 | Ht 66.0 in | Wt 205.0 lb

## 2019-12-12 DIAGNOSIS — C252 Malignant neoplasm of tail of pancreas: Secondary | ICD-10-CM

## 2019-12-12 DIAGNOSIS — Z95828 Presence of other vascular implants and grafts: Secondary | ICD-10-CM

## 2019-12-12 DIAGNOSIS — Z5111 Encounter for antineoplastic chemotherapy: Secondary | ICD-10-CM | POA: Diagnosis not present

## 2019-12-12 LAB — CMP (CANCER CENTER ONLY)
ALT: 32 U/L (ref 0–44)
AST: 55 U/L — ABNORMAL HIGH (ref 15–41)
Albumin: 2.8 g/dL — ABNORMAL LOW (ref 3.5–5.0)
Alkaline Phosphatase: 83 U/L (ref 38–126)
Anion gap: 8 (ref 5–15)
BUN: 10 mg/dL (ref 8–23)
CO2: 26 mmol/L (ref 22–32)
Calcium: 8.1 mg/dL — ABNORMAL LOW (ref 8.9–10.3)
Chloride: 105 mmol/L (ref 98–111)
Creatinine: 0.81 mg/dL (ref 0.61–1.24)
GFR, Est AFR Am: >60 mL/min (ref >60)
GFR, Estimated: >60 mL/min (ref >60)
Glucose, Bld: 125 mg/dL — ABNORMAL HIGH (ref 70–99)
Potassium: 3.7 mmol/L (ref 3.5–5.1)
Sodium: 139 mmol/L (ref 135–145)
Total Bilirubin: 0.8 mg/dL (ref 0.3–1.2)
Total Protein: 5.9 g/dL — ABNORMAL LOW (ref 6.5–8.1)

## 2019-12-12 LAB — CBC WITH DIFFERENTIAL (CANCER CENTER ONLY)
Abs Immature Granulocytes: 0.01 10*3/uL (ref 0.00–0.07)
Basophils Absolute: 0 10*3/uL (ref 0.0–0.1)
Basophils Relative: 1 %
Eosinophils Absolute: 0.1 10*3/uL (ref 0.0–0.5)
Eosinophils Relative: 2 %
HCT: 40.6 % (ref 39.0–52.0)
Hemoglobin: 13 g/dL (ref 13.0–17.0)
Immature Granulocytes: 0 %
Lymphocytes Relative: 19 %
Lymphs Abs: 0.9 10*3/uL (ref 0.7–4.0)
MCH: 29.4 pg (ref 26.0–34.0)
MCHC: 32 g/dL (ref 30.0–36.0)
MCV: 91.9 fL (ref 80.0–100.0)
Monocytes Absolute: 1.5 10*3/uL — ABNORMAL HIGH (ref 0.1–1.0)
Monocytes Relative: 32 %
Neutro Abs: 2.2 10*3/uL (ref 1.7–7.7)
Neutrophils Relative %: 46 %
Platelet Count: 158 10*3/uL (ref 150–400)
RBC: 4.42 MIL/uL (ref 4.22–5.81)
RDW: 13.7 % (ref 11.5–15.5)
WBC Count: 4.6 10*3/uL (ref 4.0–10.5)
nRBC: 0 % (ref 0.0–0.2)

## 2019-12-12 MED ORDER — PACLITAXEL PROTEIN-BOUND CHEMO INJECTION 100 MG
100.0000 mg/m2 | Freq: Once | INTRAVENOUS | Status: AC
  Administered 2019-12-12: 200 mg via INTRAVENOUS
  Filled 2019-12-12: qty 40

## 2019-12-12 MED ORDER — SODIUM CHLORIDE 0.9 % IV SOLN
2000.0000 mg | Freq: Once | INTRAVENOUS | Status: AC
  Administered 2019-12-12: 2000 mg via INTRAVENOUS
  Filled 2019-12-12: qty 52.6

## 2019-12-12 MED ORDER — PROCHLORPERAZINE EDISYLATE 10 MG/2ML IJ SOLN
10.0000 mg | Freq: Once | INTRAMUSCULAR | Status: AC
  Administered 2019-12-12: 10 mg via INTRAVENOUS

## 2019-12-12 MED ORDER — SODIUM CHLORIDE 0.9% FLUSH
10.0000 mL | INTRAVENOUS | Status: DC | PRN
  Administered 2019-12-12: 10 mL
  Filled 2019-12-12: qty 10

## 2019-12-12 MED ORDER — PROCHLORPERAZINE EDISYLATE 10 MG/2ML IJ SOLN
INTRAMUSCULAR | Status: AC
  Filled 2019-12-12: qty 2

## 2019-12-12 MED ORDER — PROCHLORPERAZINE MALEATE 10 MG PO TABS: 10.0000 mg | ORAL_TABLET | Freq: Once | ORAL | Status: DC

## 2019-12-12 MED ORDER — SODIUM CHLORIDE 0.9 % IV SOLN
Freq: Once | INTRAVENOUS | Status: AC
  Filled 2019-12-12: qty 250

## 2019-12-12 MED ORDER — HEPARIN SOD (PORK) LOCK FLUSH 100 UNIT/ML IV SOLN
500.0000 [IU] | Freq: Once | INTRAVENOUS | Status: AC | PRN
  Administered 2019-12-12: 500 [IU]
  Filled 2019-12-12: qty 5

## 2019-12-12 MED ORDER — PROCHLORPERAZINE MALEATE 10 MG PO TABS
ORAL_TABLET | ORAL | Status: AC
  Filled 2019-12-12: qty 1

## 2019-12-12 NOTE — Progress Notes (Addendum)
[facility] OFFICE PROGRESS NOTE   Diagnosis: Pancreas cancer  INTERVAL HISTORY:   Alex [name] returns as scheduled.  He completed another cycle of gemcitabine/Abraxane 11/28/2019.  He has had a few episodes of vomiting.  He takes Compazine if needed.  No mouth sores.  No diarrhea.  No fever or rash following treatment.  He notes an alteration in taste which he feels accounts for the weight loss noted since last appointment.  He has stable neuropathy symptoms involving the first three fingers on both hands and tingling in the feet mainly at nighttime.  He reports a "lump" at the right upper arm, some tenderness and drainage.  Has been present for many years and now seems to be getting larger.  Objective:  Vital signs in last 24 hours:  Blood pressure 117/80, pulse 64, temperature (!) 97.5 F (36.4 C), temperature source Temporal, resp. rate 17, height '5\' 6"'  (1.676 m), weight 205 lb (93 kg), SpO2 98 %.    HEENT: No thrush or ulcers. Resp: Decreased breath sounds at the right base.  No respiratory distress. Cardio: Regular rate and rhythm. GI: Abdomen soft and nontender.  No hepatomegaly.  Left lower quadrant colostomy. Vascular: No leg edema. Neuro: Vibratory sense mildly decreased over the fingertips per tuning fork exam. Skin: Tender cystic-like lesion with mild surrounding erythema right upper arm, some fluctuance. Port-A-Cath without erythema.  Lab Results:  Lab Results  Component Value Date   WBC 4.6 12/12/2019   HGB 13.0 12/12/2019   HCT 40.6 12/12/2019   MCV 91.9 12/12/2019   PLT 158 12/12/2019   NEUTROABS 2.2 12/12/2019    Imaging:  No results found.  Medications: I have reviewed the patient's current medications.  Assessment/Plan: 1. 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 Well-differentiated adenocarcinoma, 0/13 lymph nodes positive, MSI-stable, no loss of mismatch  repair protein expression ? Tumor involving the rectosigmoid junction ? Sigmoidoscopy 09/18/2017-distal sigmoid colon tumor beginning between 15 and 20 cm from the dentate line, completely obstructing ? CT abdomen/pelvis 09/14/2017-sigmoid thickening no evidence of metastatic disease ? 2 mm right upper lobe nodule on CT 08/04/2017 ? Colonoscopy 02/07/2018-polyps removed from the cecum, ascending colon, transverse colon, descending colon, rectosigmoid colon and rectum (tubular adenomas)  2. Right lung pneumonia/empyema March 2019  Right VATS, drainage of empyema, and decortication of the right lower lobe 08/31/2017  3.CVA in 2010  4.Rectal and active sigmoid polyps noted on flexible sigmoidoscopy 09/18/2017  5.Pancreas tail mass/right liver lesions and 4 mm splenic lesion on CT abdomen/pelvis 05/27/2018  MRI abdomen 07/27/2018-right liver lesions, rim-enhancing lesion in the pancreas tail  Ultrasound-guided biopsy of a right liver lesion 08/14/2018-metastatic adenocarcinoma, cytokeratin 7+, CDX-2 weak positive, cytokeratin 20 negative, consistent with metastatic pancreas cancer; MSI stable; tumor mutational burden 0  CT abdomen/pelvis 09/26/2018-increased size of previously noted liver lesions, new right liver lesion, 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  Cycle 6 gemcitabine/Abraxane 12/20/2018  Cycle 7 gemcitabine/Abraxane 01/03/2019  Cycle 8 gemcitabine/Abraxane 01/17/2019  Cycle 9 gemcitabine/Abraxane 01/31/2019  Cycle 10 gemcitabine/Abraxane 02/13/2019  Cycle 11 gemcitabine/Abraxane 02/28/2019 (Abraxane  held secondary to neuropathy symptoms)  Cycle 12 gemcitabine/Abraxane 03/14/2019 (Abraxane held due to  persistent neuropathy symptoms)  Cycle 13 gemcitabine/Abraxane 03/28/2019 (Abraxane held due to persistent neuropathy symptoms)  Cycle 14 gemcitabine/Abraxane 04/11/2019 (Abraxane held due to persistent neuropathy symptoms)  CT abdomen/pelvis 04/28/2019-decrease in size of pancreatic mass. Decrease in size of hepatic metastatic foci.   Cycle 15 gemcitabine12/05/2018(Abraxane held due to neuropathy symptoms)  Cycle 16 gemcitabine 05/16/2019 (Abraxane held due to neuropathy symptoms)  Cycle 17 gemcitabine 05/31/2019 (Abraxane on hold due to neuropathy symptoms)  Cycle 18 gemcitabine 06/13/2019 (Abraxane held)  Cycle 19 gemcitabine 06/27/2019 (Abraxane held)  Cycle 20 gemcitabine 07/11/2019 (Abraxane held)  Cycle 21 gemcitabine 07/25/2019 (Abraxane held)  CTabdomen/pelvis 08/05/2019-stable small liver lesions, stable pancreas mass, no evidence of disease progression  Cycle 22 gemcitabine 08/08/2019 (Abraxane held)  Cycle 23 gemcitabine 08/22/2019 (Abraxane held)  Cycle 24 gemcitabine 09/05/2019 (Abraxane held)  Cycle 25 gemcitabine 09/19/2019 (Abraxane held)  CT abdomen/pelvis 09/30/2019-no change in pancreas mass, slight enlargement of right liver metastases, largest measuring 1 cm, no new lesions  Cycle 26 gemcitabine/Abraxane 10/03/2019-Abraxane resumed at a dose of 100 mg/m  Cycle 27 gemcitabine/Abraxane 10/17/2019  Cycle 28 gemcitabine/Abraxane 10/31/2019  Cycle 29 gemcitabine/Abraxane 11/14/2019  Cycle 30 gemcitabine/Abraxane 11/28/2019  Cycle 31 gemcitabine/Abraxane 12/12/2019  6.Port-A-Cath placement by Dr. 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  He has completed 30 cycles of gemcitabine/Abraxane.  There is no clinical evidence of disease progression.  Most recent CA 19-9 was stable.  Plan to proceed with cycle 31  today as scheduled.  He will undergo restaging CTs prior to his next appointment in 2 weeks.  We reviewed the CBC and chemistry panel from today.  Labs adequate to proceed as above.  The lesion at the right upper arm may be a sebaceous cyst, possibly becoming infected.  We will contact [facility] for an appointment in their urgent clinic to evaluate/possibly drain.  He will return for lab, follow-up, chemotherapy in 2 weeks.  He will contact the office in the interim with any problems.  Patient seen with Dr. Benay Spice.    Ned Card ANP/GNP-BC   12/12/2019  11:46 AM This was a shared visit with Ned Card.  Alex [name] was interviewed and examined.  He continues to tolerate the gemcitabine/Abraxane well.  He will complete another cycle today.  He appears to have an inflamed sebaceous cyst at the right upper arm.  We were able to express white/yellow material.  He will be referred for surgical evaluation.  Julieanne Manson, MD

## 2019-12-12 NOTE — Patient Instructions (Signed)
[facility] Discharge Instructions for Patients Receiving Chemotherapy  Today you received the following chemotherapy agents: Abraxane and Gemzar  To help prevent nausea and vomiting after your treatment, we encourage you to take your nausea medication as prescribed.    If you develop nausea and vomiting that is not controlled by your nausea medication, call the clinic.   BELOW ARE SYMPTOMS THAT SHOULD BE REPORTED IMMEDIATELY:  *FEVER GREATER THAN 100.5 F  *CHILLS WITH OR WITHOUT FEVER  NAUSEA AND VOMITING THAT IS NOT CONTROLLED WITH YOUR NAUSEA MEDICATION  *UNUSUAL SHORTNESS OF BREATH  *UNUSUAL BRUISING OR BLEEDING  TENDERNESS IN MOUTH AND THROAT WITH OR WITHOUT PRESENCE OF ULCERS  *URINARY PROBLEMS  *BOWEL PROBLEMS  UNUSUAL RASH Items with * indicate a potential emergency and should be followed up as soon as possible.  Feel free to call the clinic should you have any questions or concerns. The clinic phone number is [phone].  Please show the [facility] at check-in to the Emergency Department and triage nurse.

## 2019-12-13 LAB — CANCER ANTIGEN 19-9: CA 19-9: 53 U/mL — ABNORMAL HIGH (ref 0–35)

## 2019-12-15 ENCOUNTER — Telehealth: Payer: Self-pay | Admitting: Nurse Practitioner

## 2019-12-15 NOTE — Telephone Encounter (Signed)
Scheduled per 7/16 los. Called pt and left a msg, mailing appt letter and calendar printout

## 2019-12-18 ENCOUNTER — Telehealth: Payer: Self-pay | Admitting: *Deleted

## 2019-12-18 NOTE — Telephone Encounter (Signed)
Provided CT appointment 7/29 at 0745/0800 at [facility]. NPO 4 hours prior and contrast at 0600 and 0700. He was able to read back instructions and confirms he already has the contrast at home.

## 2019-12-21 ENCOUNTER — Other Ambulatory Visit: Payer: Self-pay | Admitting: Oncology

## 2019-12-25 ENCOUNTER — Ambulatory Visit (HOSPITAL_COMMUNITY)
Admission: RE | Admit: 2019-12-25 | Discharge: 2019-12-25 | Disposition: A | Discharge status: Home / Self Care | Payer: Medicare Other | Source: Ambulatory Visit | Attending: Nurse Practitioner | Admitting: Nurse Practitioner

## 2019-12-25 ENCOUNTER — Other Ambulatory Visit: Payer: Self-pay

## 2019-12-25 DIAGNOSIS — C252 Malignant neoplasm of tail of pancreas: Secondary | ICD-10-CM | POA: Insufficient documentation

## 2019-12-25 MED ORDER — SODIUM CHLORIDE (PF) 0.9 % IJ SOLN
INTRAMUSCULAR | Status: AC
  Filled 2019-12-25: qty 50

## 2019-12-25 MED ORDER — HEPARIN SOD (PORK) LOCK FLUSH 100 UNIT/ML IV SOLN: 500.0000 [IU] | Freq: Once | INTRAVENOUS | Status: AC

## 2019-12-25 MED ORDER — HEPARIN SOD (PORK) LOCK FLUSH 100 UNIT/ML IV SOLN
INTRAVENOUS | Status: AC
  Administered 2019-12-25: 500 [IU] via INTRAVENOUS
  Filled 2019-12-25: qty 5

## 2019-12-25 MED ORDER — IOHEXOL 300 MG/ML  SOLN
100.0000 mL | Freq: Once | INTRAMUSCULAR | Status: AC | PRN
  Administered 2019-12-25: 100 mL via INTRAVENOUS

## 2019-12-26 ENCOUNTER — Inpatient Hospital Stay (HOSPITAL_BASED_OUTPATIENT_CLINIC_OR_DEPARTMENT_OTHER): Payer: Medicare Other | Admitting: Oncology

## 2019-12-26 ENCOUNTER — Inpatient Hospital Stay: Payer: Medicare Other

## 2019-12-26 ENCOUNTER — Other Ambulatory Visit: Payer: Self-pay

## 2019-12-26 VITALS — BP 123/81 | HR 67 | Temp 98.2°F | Resp 18 | Ht 66.0 in | Wt 198.9 lb

## 2019-12-26 DIAGNOSIS — Z95828 Presence of other vascular implants and grafts: Secondary | ICD-10-CM

## 2019-12-26 DIAGNOSIS — C252 Malignant neoplasm of tail of pancreas: Secondary | ICD-10-CM

## 2019-12-26 DIAGNOSIS — Z5111 Encounter for antineoplastic chemotherapy: Secondary | ICD-10-CM | POA: Diagnosis not present

## 2019-12-26 LAB — CMP (CANCER CENTER ONLY)
ALT: 37 U/L (ref 0–44)
AST: 67 U/L — ABNORMAL HIGH (ref 15–41)
Albumin: 3 g/dL — ABNORMAL LOW (ref 3.5–5.0)
Alkaline Phosphatase: 94 U/L (ref 38–126)
Anion gap: 9 (ref 5–15)
BUN: 10 mg/dL (ref 8–23)
CO2: 28 mmol/L (ref 22–32)
Calcium: 8.8 mg/dL — ABNORMAL LOW (ref 8.9–10.3)
Chloride: 100 mmol/L (ref 98–111)
Creatinine: 0.98 mg/dL (ref 0.61–1.24)
GFR, Est AFR Am: >60 mL/min (ref >60)
GFR, Estimated: >60 mL/min (ref >60)
Glucose, Bld: 134 mg/dL — ABNORMAL HIGH (ref 70–99)
Potassium: 3.3 mmol/L — ABNORMAL LOW (ref 3.5–5.1)
Sodium: 137 mmol/L (ref 135–145)
Total Bilirubin: 1 mg/dL (ref 0.3–1.2)
Total Protein: 6.1 g/dL — ABNORMAL LOW (ref 6.5–8.1)

## 2019-12-26 LAB — CBC WITH DIFFERENTIAL (CANCER CENTER ONLY)
Abs Immature Granulocytes: 0.01 10*3/uL (ref 0.00–0.07)
Basophils Absolute: 0 10*3/uL (ref 0.0–0.1)
Basophils Relative: 2 %
Eosinophils Absolute: 0.1 10*3/uL (ref 0.0–0.5)
Eosinophils Relative: 3 %
HCT: 40 % (ref 39.0–52.0)
Hemoglobin: 13 g/dL (ref 13.0–17.0)
Immature Granulocytes: 0 %
Lymphocytes Relative: 28 %
Lymphs Abs: 0.7 10*3/uL (ref 0.7–4.0)
MCH: 28.8 pg (ref 26.0–34.0)
MCHC: 32.5 g/dL (ref 30.0–36.0)
MCV: 88.7 fL (ref 80.0–100.0)
Monocytes Absolute: 1.2 10*3/uL — ABNORMAL HIGH (ref 0.1–1.0)
Monocytes Relative: 48 %
Neutro Abs: 0.5 10*3/uL — ABNORMAL LOW (ref 1.7–7.7)
Neutrophils Relative %: 19 %
Platelet Count: 174 10*3/uL (ref 150–400)
RBC: 4.51 MIL/uL (ref 4.22–5.81)
RDW: 13.6 % (ref 11.5–15.5)
WBC Count: 2.5 10*3/uL — ABNORMAL LOW (ref 4.0–10.5)
nRBC: 0 % (ref 0.0–0.2)

## 2019-12-26 MED ORDER — PROCHLORPERAZINE MALEATE 10 MG PO TABS
ORAL_TABLET | ORAL | Status: AC
  Filled 2019-12-26: qty 1

## 2019-12-26 MED ORDER — SODIUM CHLORIDE 0.9% FLUSH
10.0000 mL | INTRAVENOUS | Status: DC | PRN
  Administered 2019-12-26: 10 mL via INTRAVENOUS
  Filled 2019-12-26: qty 10

## 2019-12-26 MED ORDER — HEPARIN SOD (PORK) LOCK FLUSH 100 UNIT/ML IV SOLN
500.0000 [IU] | Freq: Once | INTRAVENOUS | Status: AC
  Administered 2019-12-26: 500 [IU] via INTRAVENOUS
  Filled 2019-12-26: qty 5

## 2019-12-26 MED ORDER — SODIUM CHLORIDE 0.9% FLUSH
10.0000 mL | INTRAVENOUS | Status: DC | PRN
  Administered 2019-12-26: 10 mL
  Filled 2019-12-26: qty 10

## 2019-12-26 NOTE — Patient Instructions (Signed)

## 2019-12-26 NOTE — Progress Notes (Signed)
[place] OFFICE PROGRESS NOTE   Diagnosis: Pancreas cancer  INTERVAL HISTORY:   Alex [name] completed another cycle of gemcitabine/Abraxane on 12/12/2019. He reports increased numbness and tingling in the left foot.  Gabapentin has not helped.  Stable numbness in the hands.  This does not interfere with activity.  He has altered taste.  The sebaceous cyst of the right upper arm was drained by the surgeons on 12/12/2019.  He continues to have mild drainage from the site. Objective:  Vital signs in last 24 hours:  Blood pressure 123/81, pulse 67, temperature 98.2 F (36.8 C), temperature source Oral, resp. rate 18, height 5' 6" (1.676 m), weight 198 lb 14.4 oz (90.2 kg), SpO2 98 %.    HEENT: No thrush or ulcers Resp: Lungs with end inspiratory rhonchi at the right posterior base, no respiratory distress Cardio: Regular rate and rhythm GI: No hepatomegaly, no mass, nontender, left lower quadrant colostomy Vascular: No leg edema Neuro: Sensation intact to light touch at the feet Skin: Sebaceous cyst at the right upper arm appear smaller.  I was able to express a small amount of white semisolid material.  No surrounding erythema.  Portacath/PICC-without erythema  Lab Results:  Lab Results  Component Value Date   WBC 2.5 (L) 12/26/2019   HGB 13.0 12/26/2019   HCT 40.0 12/26/2019   MCV 88.7 12/26/2019   PLT 174 12/26/2019   NEUTROABS 0.5 (L) 12/26/2019    CMP  Lab Results  Component Value Date   NA 137 12/26/2019   K 3.3 (L) 12/26/2019   CL 100 12/26/2019   CO2 28 12/26/2019   GLUCOSE 134 (H) 12/26/2019   BUN 10 12/26/2019   CREATININE 0.98 12/26/2019   CALCIUM 8.8 (L) 12/26/2019   PROT 6.1 (L) 12/26/2019   ALBUMIN 3.0 (L) 12/26/2019   AST 67 (H) 12/26/2019   ALT 37 12/26/2019   ALKPHOS 94 12/26/2019   BILITOT 1.0 12/26/2019   GFRNONAA >60 12/26/2019   GFRAA >60 12/26/2019     Imaging:  CT Abdomen Pelvis W Contrast  Result Date:  12/25/2019 CLINICAL DATA:  Metastatic pancreatic cancer.  Restaging. EXAM: CT ABDOMEN AND PELVIS WITH CONTRAST TECHNIQUE: Multidetector CT imaging of the abdomen and pelvis was performed using the standard protocol following bolus administration of intravenous contrast. CONTRAST:  113m OMNIPAQUE IOHEXOL 300 MG/ML  SOLN COMPARISON:  09/30/2019 FINDINGS: Lower chest: 4 mm right lower lobe pulmonary nodule identified on image 1/series 3. This portion of the lung was not included on prior imaging. 4 mm lingular nodule on image 1/series 3 was not present on chest CT of 08/26/2017. Additional tiny bilateral pulmonary nodules are seen in the lung bases also new since 2019. Hepatobiliary: 10 mm subcapsular low-density lesion previously described as metastatic deposit is stable on 31/2. This was also previously described as 2 adjacent lesions although only a single abnormality is discernible on today's exam. No new suspicious liver lesion on today's study. There is no evidence for gallstones, gallbladder wall thickening, or pericholecystic fluid. No intrahepatic or extrahepatic biliary dilation. Pancreas: Similar appearance hypoenhancing lesion in the tail of pancreas measuring 3.0 x 1.6 cm on image 51/series 7 today compared to 3.0 x 1.5 cm previously. Spleen: No splenomegaly. No focal mass lesion. Adrenals/Urinary Tract: No adrenal nodule or mass. Right kidney unremarkable. Scarring in the lower pole left kidney with tiny cyst is unchanged in the interval. No evidence for hydroureter. Bladder is decompressed. Stomach/Bowel: Stomach is unremarkable. No gastric wall thickening. No  evidence of outlet obstruction. Duodenum is normally positioned as is the ligament of Treitz. No small bowel wall thickening. No small bowel dilatation. The terminal ileum is normal. The appendix is normal. No gross colonic mass. No colonic wall thickening. Left abdominal sigmoid end colostomy noted with Hartmann's pouch anatomy.  Vascular/Lymphatic: There is abdominal aortic atherosclerosis without aneurysm. There is no gastrohepatic or hepatoduodenal ligament lymphadenopathy. No retroperitoneal or mesenteric lymphadenopathy. No pelvic sidewall lymphadenopathy. Reproductive: The prostate gland and seminal vesicles are unremarkable. Other: No intraperitoneal free fluid. Musculoskeletal: Tiny right groin hernia contains only fat. No worrisome lytic or sclerotic osseous abnormality. IMPRESSION: 1. No substantial interval change in the hypoenhancing lesion in the tail of pancreas. 2. Stable 10 mm subcapsular low-density right hepatic lesion previously described as metastatic deposit. No new liver metastases. 3. Tiny bilateral pulmonary nodules in the lung bases, new since chest CT of 09/25/2017 and progressive since abdomen/pelvis CT of 08/05/2019. Metastatic disease a concern. Consider CT chest without contrast to further evaluate. 4. Aortic Atherosclerosis (ICD10-I70.0). Electronically Signed   By: Misty Stanley M.D.   On: 12/25/2019 11:25    Medications: I have reviewed the patient's current medications.   Assessment/Plan: 1. 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 Well-differentiated adenocarcinoma, 0/13 lymph nodes positive, MSI-stable, no loss of mismatch repair protein expression ? Tumor involving the rectosigmoid junction ? Sigmoidoscopy 09/18/2017-distal sigmoid colon tumor beginning between 15 and 20 cm from the dentate line, completely obstructing ? CT abdomen/pelvis 09/14/2017-sigmoid thickening no evidence of metastatic disease ? 2 mm right upper lobe nodule on CT 08/04/2017 ? Colonoscopy 02/07/2018-polyps removed from the cecum, ascending colon, transverse colon, descending colon, rectosigmoid colon and rectum (tubular adenomas)  2. Right lung pneumonia/empyema March 2019  Right VATS, drainage of empyema, and decortication  of the right lower lobe 08/31/2017  3.CVA in 2010  4.Rectal and active sigmoid polyps noted on flexible sigmoidoscopy 09/18/2017  5.Pancreas tail mass/right liver lesions and 4 mm splenic lesion on CT abdomen/pelvis 05/27/2018  MRI abdomen 07/27/2018-right liver lesions, rim-enhancing lesion in the pancreas tail  Ultrasound-guided biopsy of a right liver lesion 08/14/2018-metastatic adenocarcinoma, cytokeratin 7+, CDX-2 weak positive, cytokeratin 20 negative, consistent with metastatic pancreas cancer; MSI stable; tumor mutational burden 0  CT abdomen/pelvis 09/26/2018-increased size of previously noted liver lesions, new right liver lesion, 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  Cycle 6 gemcitabine/Abraxane 12/20/2018  Cycle 7 gemcitabine/Abraxane 01/03/2019  Cycle 8 gemcitabine/Abraxane 01/17/2019  Cycle 9 gemcitabine/Abraxane 01/31/2019  Cycle 10 gemcitabine/Abraxane 02/13/2019  Cycle 11 gemcitabine/Abraxane 02/28/2019 (Abraxane held secondary to neuropathy symptoms)  Cycle 12 gemcitabine/Abraxane 03/14/2019 (Abraxane held due to persistent neuropathy symptoms)  Cycle 13 gemcitabine/Abraxane 03/28/2019 (Abraxane held due to persistent neuropathy symptoms)  Cycle 14 gemcitabine/Abraxane 04/11/2019 (Abraxane held due to persistent neuropathy symptoms)  CT abdomen/pelvis 04/28/2019-decrease in size of pancreatic mass. Decrease in size of hepatic metastatic foci.   Cycle 15 gemcitabine12/05/2018(Abraxane held due to neuropathy symptoms)  Cycle 16 gemcitabine 05/16/2019 (Abraxane held due to neuropathy  symptoms)  Cycle 17 gemcitabine 05/31/2019 (Abraxane on hold due to neuropathy symptoms)  Cycle 18 gemcitabine 06/13/2019 (Abraxane held)  Cycle 19 gemcitabine 06/27/2019 (Abraxane held)  Cycle 20  gemcitabine 07/11/2019 (Abraxane held)  Cycle 21 gemcitabine 07/25/2019 (Abraxane held)  CTabdomen/pelvis 08/05/2019-stable small liver lesions, stable pancreas mass, no evidence of disease progression  Cycle 22 gemcitabine 08/08/2019 (Abraxane held)  Cycle 23 gemcitabine 08/22/2019 (Abraxane held)  Cycle 24 gemcitabine 09/05/2019 (Abraxane held)  Cycle 25 gemcitabine 09/19/2019 (Abraxane held)  CT abdomen/pelvis 09/30/2019-no change in pancreas mass, slight enlargement of right liver metastases, largest measuring 1 cm, no new lesions  Cycle 26 gemcitabine/Abraxane 10/03/2019-Abraxane resumed at a dose of 100 mg/m  Cycle 27 gemcitabine/Abraxane 10/17/2019  Cycle 28 gemcitabine/Abraxane 10/31/2019  Cycle 29 gemcitabine/Abraxane 11/14/2019  Cycle 30 gemcitabine/Abraxane 11/28/2019  Cycle 31 gemcitabine/Abraxane 12/12/2019  CT abdomen/pelvis 12/25/2019-stable right hepatic lesion, no new liver lesions, stable pancreas tail mass, tiny bilateral lung base nodules, new compared to a chest CT 09/25/2017 and some have progressed compared to a's CT abdomen 08/05/2019  6.Port-A-Cath placement by Dr. 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  Neutropenia secondary to chemotherapy, chemotherapy held 12/26/2019    Disposition: Alex [name] appears unchanged.  He has persistent neuropathy symptoms secondary to Abraxane.  He will increase the gabapentin dose at night.  I reviewed the CT images with Alex [name].  There is no evidence of disease progression in the liver or pancreas.  There are tiny bilateral nodules in the lower lungs.  Some of these have enlarged or are new compared to previous CTs.  These may represent metastases.  The CA 19-9 has stabilized since resuming Abraxane.  We  discussed treatment options.  The plan is to continue gemcitabine/Abraxane with close clinical and radiologic follow-up.  He has neutropenia today.  He will call for a fever or symptoms of infection.  Chemotherapy will be held today.  Alex [name] will return for an office visit and chemotherapy in 1 week.  Betsy Coder, MD  12/26/2019  10:29 AM

## 2019-12-27 LAB — CANCER ANTIGEN 19-9: CA 19-9: 56 U/mL — ABNORMAL HIGH (ref 0–35)

## 2019-12-29 ENCOUNTER — Telehealth: Payer: Self-pay | Admitting: Oncology

## 2019-12-29 ENCOUNTER — Other Ambulatory Visit: Payer: Self-pay | Admitting: *Deleted

## 2019-12-29 MED ORDER — GABAPENTIN 100 MG PO CAPS: 300.0000 mg | ORAL_CAPSULE | Freq: Every day | ORAL | 2 refills | Status: DC

## 2019-12-29 NOTE — Telephone Encounter (Signed)
Scheduled appts per 7/30 los. Pt confirmed appt date and time.

## 2019-12-29 NOTE — Progress Notes (Signed)
Patient called to request new script for the increase dosing of gabapentin MD ordered at last visit. Script sent.

## 2020-01-02 ENCOUNTER — Inpatient Hospital Stay: Payer: Medicare Other

## 2020-01-02 ENCOUNTER — Other Ambulatory Visit: Payer: Self-pay

## 2020-01-02 ENCOUNTER — Inpatient Hospital Stay: Payer: Medicare Other | Attending: Nurse Practitioner | Admitting: Nurse Practitioner

## 2020-01-02 ENCOUNTER — Encounter: Payer: Self-pay | Admitting: Nurse Practitioner

## 2020-01-02 VITALS — BP 128/84 | HR 71 | Temp 97.6°F | Resp 18 | Ht 66.0 in | Wt 202.3 lb

## 2020-01-02 DIAGNOSIS — D701 Agranulocytosis secondary to cancer chemotherapy: Secondary | ICD-10-CM | POA: Insufficient documentation

## 2020-01-02 DIAGNOSIS — Z5111 Encounter for antineoplastic chemotherapy: Secondary | ICD-10-CM | POA: Insufficient documentation

## 2020-01-02 DIAGNOSIS — Z23 Encounter for immunization: Secondary | ICD-10-CM | POA: Insufficient documentation

## 2020-01-02 DIAGNOSIS — C252 Malignant neoplasm of tail of pancreas: Secondary | ICD-10-CM

## 2020-01-02 DIAGNOSIS — Z95828 Presence of other vascular implants and grafts: Secondary | ICD-10-CM

## 2020-01-02 LAB — CMP (CANCER CENTER ONLY)
ALT: 28 U/L (ref 0–44)
AST: 60 U/L — ABNORMAL HIGH (ref 15–41)
Albumin: 2.9 g/dL — ABNORMAL LOW (ref 3.5–5.0)
Alkaline Phosphatase: 93 U/L (ref 38–126)
Anion gap: 7 (ref 5–15)
BUN: 15 mg/dL (ref 8–23)
CO2: 28 mmol/L (ref 22–32)
Calcium: 9 mg/dL (ref 8.9–10.3)
Chloride: 103 mmol/L (ref 98–111)
Creatinine: 0.85 mg/dL (ref 0.61–1.24)
GFR, Est AFR Am: >60 mL/min (ref >60)
GFR, Estimated: >60 mL/min (ref >60)
Glucose, Bld: 123 mg/dL — ABNORMAL HIGH (ref 70–99)
Potassium: 3.8 mmol/L (ref 3.5–5.1)
Sodium: 138 mmol/L (ref 135–145)
Total Bilirubin: 0.7 mg/dL (ref 0.3–1.2)
Total Protein: 6.1 g/dL — ABNORMAL LOW (ref 6.5–8.1)

## 2020-01-02 LAB — CBC WITH DIFFERENTIAL (CANCER CENTER ONLY)
Abs Immature Granulocytes: 0.06 10*3/uL (ref 0.00–0.07)
Basophils Absolute: 0.1 10*3/uL (ref 0.0–0.1)
Basophils Relative: 1 %
Eosinophils Absolute: 0.1 10*3/uL (ref 0.0–0.5)
Eosinophils Relative: 1 %
HCT: 41.5 % (ref 39.0–52.0)
Hemoglobin: 13.3 g/dL (ref 13.0–17.0)
Immature Granulocytes: 1 %
Lymphocytes Relative: 14 %
Lymphs Abs: 1.1 10*3/uL (ref 0.7–4.0)
MCH: 28.7 pg (ref 26.0–34.0)
MCHC: 32 g/dL (ref 30.0–36.0)
MCV: 89.4 fL (ref 80.0–100.0)
Monocytes Absolute: 1.2 10*3/uL — ABNORMAL HIGH (ref 0.1–1.0)
Monocytes Relative: 16 %
Neutro Abs: 5 10*3/uL (ref 1.7–7.7)
Neutrophils Relative %: 67 %
Platelet Count: 184 10*3/uL (ref 150–400)
RBC: 4.64 MIL/uL (ref 4.22–5.81)
RDW: 14.7 % (ref 11.5–15.5)
WBC Count: 7.5 10*3/uL (ref 4.0–10.5)
nRBC: 0 % (ref 0.0–0.2)

## 2020-01-02 MED ORDER — SODIUM CHLORIDE 0.9% FLUSH
10.0000 mL | INTRAVENOUS | Status: DC | PRN
  Administered 2020-01-02: 10 mL
  Filled 2020-01-02: qty 10

## 2020-01-02 MED ORDER — SODIUM CHLORIDE 0.9 % IV SOLN
800.0000 mg/m2 | Freq: Once | INTRAVENOUS | Status: AC
  Administered 2020-01-02: 1634 mg via INTRAVENOUS
  Filled 2020-01-02: qty 42.98

## 2020-01-02 MED ORDER — SODIUM CHLORIDE 0.9 % IV SOLN
Freq: Once | INTRAVENOUS | Status: AC
  Filled 2020-01-02: qty 250

## 2020-01-02 MED ORDER — PACLITAXEL PROTEIN-BOUND CHEMO INJECTION 100 MG
80.0000 mg/m2 | Freq: Once | INTRAVENOUS | Status: AC
  Administered 2020-01-02: 175 mg via INTRAVENOUS
  Filled 2020-01-02: qty 35

## 2020-01-02 MED ORDER — PROCHLORPERAZINE MALEATE 10 MG PO TABS
ORAL_TABLET | ORAL | Status: AC
  Filled 2020-01-02: qty 1

## 2020-01-02 MED ORDER — PROCHLORPERAZINE MALEATE 10 MG PO TABS
10.0000 mg | ORAL_TABLET | Freq: Once | ORAL | Status: AC
  Administered 2020-01-02: 10 mg via ORAL

## 2020-01-02 MED ORDER — HEPARIN SOD (PORK) LOCK FLUSH 100 UNIT/ML IV SOLN
500.0000 [IU] | Freq: Once | INTRAVENOUS | Status: AC | PRN
  Administered 2020-01-02: 500 [IU]
  Filled 2020-01-02: qty 5

## 2020-01-02 NOTE — Patient Instructions (Signed)
[facility] Discharge Instructions for Patients Receiving Chemotherapy  Today you received the following chemotherapy agents: Gemzar, Abraxane   To help prevent nausea and vomiting after your treatment, we encourage you to take your nausea medication as directed.    If you develop nausea and vomiting that is not controlled by your nausea medication, call the clinic.   BELOW ARE SYMPTOMS THAT SHOULD BE REPORTED IMMEDIATELY:  *FEVER GREATER THAN 100.5 F  *CHILLS WITH OR WITHOUT FEVER  NAUSEA AND VOMITING THAT IS NOT CONTROLLED WITH YOUR NAUSEA MEDICATION  *UNUSUAL SHORTNESS OF BREATH  *UNUSUAL BRUISING OR BLEEDING  TENDERNESS IN MOUTH AND THROAT WITH OR WITHOUT PRESENCE OF ULCERS  *URINARY PROBLEMS  *BOWEL PROBLEMS  UNUSUAL RASH Items with * indicate a potential emergency and should be followed up as soon as possible.  Feel free to call the clinic should you have any questions or concerns. The clinic phone number is [phone].  Please show the [facility] at check-in to the Emergency Department and triage nurse.

## 2020-01-02 NOTE — Progress Notes (Signed)
[facility] OFFICE PROGRESS NOTE   Diagnosis: Pancreas cancer  INTERVAL HISTORY:   Alex [name] returns as scheduled.  He completed a cycle of gemcitabine/Abraxane 12/12/2019.  Treatment was held 12/26/2019 due to neutropenia.  He overall is feeling well.  He reports a good appetite.  No pain.  He has neuropathy symptoms mainly in the feet.  He notes improvement since the Neurontin dose was increased.  Symptoms do not significantly interfere with activity.  No nausea or vomiting.  No mouth sores.  No diarrhea.  No fever, cough, shortness of breath.  No rash.  Objective:  Vital signs in last 24 hours:  Blood pressure 128/84, pulse 71, temperature 97.6 F (36.4 C), temperature source Temporal, resp. rate 18, height '5\' 6"'  (1.676 m), weight 202 lb 4.8 oz (91.8 kg), SpO2 99 %.    HEENT: No thrush or ulcers. Resp: Lungs are clear.  Breath sounds mildly diminished at the right base.  No respiratory distress. Cardio: Regular rate and rhythm. GI: Abdomen soft and nontender.  No hepatomegaly.  Left lower quadrant colostomy. Vascular: No leg edema. Neuro: Vibratory sense minimally decreased over the fingertips on the left hand per tuning fork exam, intact over the fingertips on the right hand. Skin: No rash. Port-A-Cath without erythema.  Lab Results:  Lab Results  Component Value Date   WBC 7.5 01/02/2020   HGB 13.3 01/02/2020   HCT 41.5 01/02/2020   MCV 89.4 01/02/2020   PLT 184 01/02/2020   NEUTROABS 5.0 01/02/2020    Imaging:  No results found.  Medications: I have reviewed the patient's current medications.  Assessment/Plan: 1. 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 Well-differentiated adenocarcinoma, 0/13 lymph nodes positive, MSI-stable, no loss of mismatch repair protein expression ? Tumor involving the rectosigmoid junction ? Sigmoidoscopy 09/18/2017-distal sigmoid colon  tumor beginning between 15 and 20 cm from the dentate line, completely obstructing ? CT abdomen/pelvis 09/14/2017-sigmoid thickening no evidence of metastatic disease ? 2 mm right upper lobe nodule on CT 08/04/2017 ? Colonoscopy 02/07/2018-polyps removed from the cecum, ascending colon, transverse colon, descending colon, rectosigmoid colon and rectum (tubular adenomas)  2. Right lung pneumonia/empyema March 2019  Right VATS, drainage of empyema, and decortication of the right lower lobe 08/31/2017  3.CVA in 2010  4.Rectal and active sigmoid polyps noted on flexible sigmoidoscopy 09/18/2017  5.Pancreas tail mass/right liver lesions and 4 mm splenic lesion on CT abdomen/pelvis 05/27/2018  MRI abdomen 07/27/2018-right liver lesions, rim-enhancing lesion in the pancreas tail  Ultrasound-guided biopsy of a right liver lesion 08/14/2018-metastatic adenocarcinoma, cytokeratin 7+, CDX-2 weak positive, cytokeratin 20 negative, consistent with metastatic pancreas cancer; MSI stable; tumor mutational burden 0  CT abdomen/pelvis 09/26/2018-increased size of previously noted liver lesions, new right liver lesion, 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  Cycle 6 gemcitabine/Abraxane 12/20/2018  Cycle 7 gemcitabine/Abraxane 01/03/2019  Cycle 8 gemcitabine/Abraxane 01/17/2019  Cycle 9 gemcitabine/Abraxane 01/31/2019  Cycle 10 gemcitabine/Abraxane 02/13/2019  Cycle 11 gemcitabine/Abraxane 02/28/2019 (Abraxane held secondary to neuropathy symptoms)  Cycle 12 gemcitabine/Abraxane 03/14/2019 (Abraxane held due to persistent  neuropathy symptoms)  Cycle 13 gemcitabine/Abraxane 03/28/2019 (Abraxane held due to persistent neuropathy symptoms)  Cycle 14  gemcitabine/Abraxane 04/11/2019 (Abraxane held due to persistent neuropathy symptoms)  CT abdomen/pelvis 04/28/2019-decrease in size of pancreatic mass. Decrease in size of hepatic metastatic foci.   Cycle 15 gemcitabine12/05/2018(Abraxane held due to neuropathy symptoms)  Cycle 16 gemcitabine 05/16/2019 (Abraxane held due to neuropathy symptoms)  Cycle 17 gemcitabine 05/31/2019 (Abraxane on hold due to neuropathy symptoms)  Cycle 18 gemcitabine 06/13/2019 (Abraxane held)  Cycle 19 gemcitabine 06/27/2019 (Abraxane held)  Cycle 20 gemcitabine 07/11/2019 (Abraxane held)  Cycle 21 gemcitabine 07/25/2019 (Abraxane held)  CTabdomen/pelvis 08/05/2019-stable small liver lesions, stable pancreas mass, no evidence of disease progression  Cycle 22 gemcitabine 08/08/2019 (Abraxane held)  Cycle 23 gemcitabine 08/22/2019 (Abraxane held)  Cycle 24 gemcitabine 09/05/2019 (Abraxane held)  Cycle 25 gemcitabine 09/19/2019 (Abraxane held)  CT abdomen/pelvis 09/30/2019-no change in pancreas mass, slight enlargement of right liver metastases, largest measuring 1 cm, no new lesions  Cycle 26 gemcitabine/Abraxane 10/03/2019-Abraxane resumed at a dose of 100 mg/m  Cycle 27 gemcitabine/Abraxane 10/17/2019  Cycle 28 gemcitabine/Abraxane 10/31/2019  Cycle 29 gemcitabine/Abraxane 11/14/2019  Cycle 30 gemcitabine/Abraxane 11/28/2019  Cycle 31 gemcitabine/Abraxane 12/12/2019  CT abdomen/pelvis 12/25/2019-stable right hepatic lesion, no new liver lesions, stable pancreas tail mass, tiny bilateral lung base nodules, new compared to a chest CT 09/25/2017 and some have progressed compared to a's CT abdomen 08/05/2019  Cycle 32 gemcitabine/Abraxane 01/02/2020 (chemotherapy dose reduced due to neutropenia)  6.Port-A-Cath placement by Dr. 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  Neutropenia secondary to chemotherapy, chemotherapy held 12/26/2019   Disposition: Mr. Wint appears stable.  Gemcitabine/Abraxane held last week due to neutropenia.  We reviewed the CBC from today.  Counts are adequate to proceed with treatment.  Chemotherapy has been dose reduced due to the recent neutropenia.  He will return for lab, follow-up, gemcitabine/Abraxane in 2 weeks.  He will contact the office in the interim with any problems.    Ned Card ANP/GNP-BC   01/02/2020  12:29 PM

## 2020-01-05 ENCOUNTER — Telehealth: Payer: Self-pay | Admitting: Oncology

## 2020-01-05 NOTE — Telephone Encounter (Signed)
Scheduled appointments per 8/6 LOS. Left message on voicemail with appointment dates and times.

## 2020-01-09 ENCOUNTER — Ambulatory Visit: Payer: 59

## 2020-01-09 ENCOUNTER — Other Ambulatory Visit: Payer: 59

## 2020-01-09 ENCOUNTER — Ambulatory Visit: Payer: 59 | Admitting: Oncology

## 2020-01-11 ENCOUNTER — Other Ambulatory Visit: Payer: Self-pay | Admitting: Oncology

## 2020-01-16 ENCOUNTER — Inpatient Hospital Stay: Payer: Medicare Other

## 2020-01-16 ENCOUNTER — Inpatient Hospital Stay (HOSPITAL_BASED_OUTPATIENT_CLINIC_OR_DEPARTMENT_OTHER): Payer: Medicare Other | Admitting: Oncology

## 2020-01-16 ENCOUNTER — Telehealth: Payer: Self-pay | Admitting: Physician Assistant

## 2020-01-16 ENCOUNTER — Other Ambulatory Visit: Payer: Self-pay

## 2020-01-16 DIAGNOSIS — Z5111 Encounter for antineoplastic chemotherapy: Secondary | ICD-10-CM | POA: Diagnosis not present

## 2020-01-16 DIAGNOSIS — C252 Malignant neoplasm of tail of pancreas: Secondary | ICD-10-CM | POA: Diagnosis not present

## 2020-01-16 DIAGNOSIS — Z95828 Presence of other vascular implants and grafts: Secondary | ICD-10-CM

## 2020-01-16 DIAGNOSIS — Z23 Encounter for immunization: Secondary | ICD-10-CM

## 2020-01-16 LAB — CMP (CANCER CENTER ONLY)
ALT: 28 U/L (ref 0–44)
AST: 70 U/L — ABNORMAL HIGH (ref 15–41)
Albumin: 2.5 g/dL — ABNORMAL LOW (ref 3.5–5.0)
Alkaline Phosphatase: 105 U/L (ref 38–126)
Anion gap: 9 (ref 5–15)
BUN: 16 mg/dL (ref 8–23)
CO2: 26 mmol/L (ref 22–32)
Calcium: 8.5 mg/dL — ABNORMAL LOW (ref 8.9–10.3)
Chloride: 101 mmol/L (ref 98–111)
Creatinine: 0.85 mg/dL (ref 0.61–1.24)
GFR, Est AFR Am: >60 mL/min (ref >60)
GFR, Estimated: >60 mL/min (ref >60)
Glucose, Bld: 119 mg/dL — ABNORMAL HIGH (ref 70–99)
Potassium: 3.5 mmol/L (ref 3.5–5.1)
Sodium: 136 mmol/L (ref 135–145)
Total Bilirubin: 1.1 mg/dL (ref 0.3–1.2)
Total Protein: 5.7 g/dL — ABNORMAL LOW (ref 6.5–8.1)

## 2020-01-16 LAB — CBC WITH DIFFERENTIAL (CANCER CENTER ONLY)
Abs Immature Granulocytes: 0.03 10*3/uL (ref 0.00–0.07)
Basophils Absolute: 0 10*3/uL (ref 0.0–0.1)
Basophils Relative: 1 %
Eosinophils Absolute: 0.1 10*3/uL (ref 0.0–0.5)
Eosinophils Relative: 4 %
HCT: 38.6 % — ABNORMAL LOW (ref 39.0–52.0)
Hemoglobin: 12.4 g/dL — ABNORMAL LOW (ref 13.0–17.0)
Immature Granulocytes: 1 %
Lymphocytes Relative: 29 %
Lymphs Abs: 1 10*3/uL (ref 0.7–4.0)
MCH: 28 pg (ref 26.0–34.0)
MCHC: 32.1 g/dL (ref 30.0–36.0)
MCV: 87.1 fL (ref 80.0–100.0)
Monocytes Absolute: 1.2 10*3/uL — ABNORMAL HIGH (ref 0.1–1.0)
Monocytes Relative: 36 %
Neutro Abs: 1 10*3/uL — ABNORMAL LOW (ref 1.7–7.7)
Neutrophils Relative %: 29 %
Platelet Count: 194 10*3/uL (ref 150–400)
RBC: 4.43 MIL/uL (ref 4.22–5.81)
RDW: 15 % (ref 11.5–15.5)
WBC Count: 3.5 10*3/uL — ABNORMAL LOW (ref 4.0–10.5)
nRBC: 0 % (ref 0.0–0.2)

## 2020-01-16 MED ORDER — SODIUM CHLORIDE 0.9% FLUSH
10.0000 mL | INTRAVENOUS | Status: DC | PRN
  Filled 2020-01-16: qty 10

## 2020-01-16 MED ORDER — PROCHLORPERAZINE MALEATE 10 MG PO TABS: 10.0000 mg | ORAL_TABLET | Freq: Four times a day (QID) | ORAL | 1 refills | Status: DC | PRN

## 2020-01-16 MED ORDER — SODIUM CHLORIDE 0.9% FLUSH
10.0000 mL | INTRAVENOUS | Status: DC | PRN
  Administered 2020-01-16: 10 mL via INTRAVENOUS
  Filled 2020-01-16: qty 10

## 2020-01-16 MED ORDER — HEPARIN SOD (PORK) LOCK FLUSH 100 UNIT/ML IV SOLN
500.0000 [IU] | Freq: Once | INTRAVENOUS | Status: AC
  Administered 2020-01-16: 500 [IU] via INTRAVENOUS
  Filled 2020-01-16: qty 5

## 2020-01-16 MED ORDER — HEPARIN SOD (PORK) LOCK FLUSH 100 UNIT/ML IV SOLN
500.0000 [IU] | Freq: Once | INTRAVENOUS | Status: DC
  Filled 2020-01-16: qty 5

## 2020-01-16 MED ORDER — SODIUM CHLORIDE 0.9% FLUSH
10.0000 mL | INTRAVENOUS | Status: DC | PRN
  Administered 2020-01-16: 10 mL
  Filled 2020-01-16: qty 10

## 2020-01-16 NOTE — Patient Instructions (Signed)

## 2020-01-16 NOTE — Progress Notes (Signed)
Patient observed for 15 minutes after booster covid vaccine without adverse effect.

## 2020-01-16 NOTE — Addendum Note (Signed)
Addended by: Tania Ade on: 01/16/2020 09:28 AM   Modules accepted: Orders, SmartSet

## 2020-01-16 NOTE — Progress Notes (Signed)
[name] OFFICE PROGRESS NOTE   Diagnosis: Pancreas cancer  INTERVAL HISTORY:   Alex [name] completed another cycle of gemcitabine/Abraxane on 01/02/2020.  No rash or fever.  He has persistent numbness and burning in the toes.  Gabapentin has helped with sleeping.  The numbness has not changed.  No abdominal pain.  Good appetite.  Objective:  Vital signs in last 24 hours:  Blood pressure (!) 114/58, pulse 77, temperature 97.9 F (36.6 C), temperature source Tympanic, resp. rate 17, height '5\' 6"'  (1.676 m), weight 209 lb 8 oz (95 kg), SpO2 98 %.    HEENT: No thrush Resp: Lungs with inspiratory rales at the right posterior base, no respiratory distress Cardio: Regular rate and rhythm GI: No hepatomegaly, no mass, left lower quadrant colostomy Vascular: Trace-1+ pitting edema at the lower leg bilaterally, left lower leg is slightly larger than the right side   Portacath/PICC-without erythema  Lab Results:  Lab Results  Component Value Date   WBC 3.5 (L) 01/16/2020   HGB 12.4 (L) 01/16/2020   HCT 38.6 (L) 01/16/2020   MCV 87.1 01/16/2020   PLT 194 01/16/2020   NEUTROABS 1.0 (L) 01/16/2020    CMP  Lab Results  Component Value Date   NA 138 01/02/2020   K 3.8 01/02/2020   CL 103 01/02/2020   CO2 28 01/02/2020   GLUCOSE 123 (H) 01/02/2020   BUN 15 01/02/2020   CREATININE 0.85 01/02/2020   CALCIUM 9.0 01/02/2020   PROT 6.1 (L) 01/02/2020   ALBUMIN 2.9 (L) 01/02/2020   AST 60 (H) 01/02/2020   ALT 28 01/02/2020   ALKPHOS 93 01/02/2020   BILITOT 0.7 01/02/2020   GFRNONAA >60 01/02/2020   GFRAA >60 01/02/2020    Lab Results  Component Value Date   CEA1 1.19 08/19/2018    Medications: I have reviewed the patient's current medications.   Assessment/Plan: 1. 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 Well-differentiated adenocarcinoma, 0/13 lymph nodes positive,  MSI-stable, no loss of mismatch repair protein expression ? Tumor involving the rectosigmoid junction ? Sigmoidoscopy 09/18/2017-distal sigmoid colon tumor beginning between 15 and 20 cm from the dentate line, completely obstructing ? CT abdomen/pelvis 09/14/2017-sigmoid thickening no evidence of metastatic disease ? 2 mm right upper lobe nodule on CT 08/04/2017 ? Colonoscopy 02/07/2018-polyps removed from the cecum, ascending colon, transverse colon, descending colon, rectosigmoid colon and rectum (tubular adenomas)  2. Right lung pneumonia/empyema March 2019  Right VATS, drainage of empyema, and decortication of the right lower lobe 08/31/2017  3.CVA in 2010  4.Rectal and active sigmoid polyps noted on flexible sigmoidoscopy 09/18/2017  5.Pancreas tail mass/right liver lesions and 4 mm splenic lesion on CT abdomen/pelvis 05/27/2018  MRI abdomen 07/27/2018-right liver lesions, rim-enhancing lesion in the pancreas tail  Ultrasound-guided biopsy of a right liver lesion 08/14/2018-metastatic adenocarcinoma, cytokeratin 7+, CDX-2 weak positive, cytokeratin 20 negative, consistent with metastatic pancreas cancer; MSI stable; tumor mutational burden 0  CT abdomen/pelvis 09/26/2018-increased size of previously noted liver lesions, new right liver lesion, 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  Cycle 6 gemcitabine/Abraxane 12/20/2018  Cycle 7 gemcitabine/Abraxane 01/03/2019  Cycle 8 gemcitabine/Abraxane 01/17/2019  Cycle 9  gemcitabine/Abraxane 01/31/2019  Cycle 10 gemcitabine/Abraxane 02/13/2019  Cycle 11  gemcitabine/Abraxane 02/28/2019 (Abraxane held secondary to neuropathy symptoms)  Cycle 12 gemcitabine/Abraxane 03/14/2019 (Abraxane held due to persistent neuropathy symptoms)  Cycle 13 gemcitabine/Abraxane 03/28/2019 (Abraxane held due to persistent neuropathy symptoms)  Cycle 14 gemcitabine/Abraxane 04/11/2019 (Abraxane held due to persistent neuropathy symptoms)  CT abdomen/pelvis 04/28/2019-decrease in size of pancreatic mass. Decrease in size of hepatic metastatic foci.   Cycle 15 gemcitabine12/05/2018(Abraxane held due to neuropathy symptoms)  Cycle 16 gemcitabine 05/16/2019 (Abraxane held due to neuropathy symptoms)  Cycle 17 gemcitabine 05/31/2019 (Abraxane on hold due to neuropathy symptoms)  Cycle 18 gemcitabine 06/13/2019 (Abraxane held)  Cycle 19 gemcitabine 06/27/2019 (Abraxane held)  Cycle 20 gemcitabine 07/11/2019 (Abraxane held)  Cycle 21 gemcitabine 07/25/2019 (Abraxane held)  CTabdomen/pelvis 08/05/2019-stable small liver lesions, stable pancreas mass, no evidence of disease progression  Cycle 22 gemcitabine 08/08/2019 (Abraxane held)  Cycle 23 gemcitabine 08/22/2019 (Abraxane held)  Cycle 24 gemcitabine 09/05/2019 (Abraxane held)  Cycle 25 gemcitabine 09/19/2019 (Abraxane held)  CT abdomen/pelvis 09/30/2019-no change in pancreas mass, slight enlargement of right liver metastases, largest measuring 1 cm, no new lesions  Cycle 26 gemcitabine/Abraxane 10/03/2019-Abraxane resumed at a dose of 100 mg/m  Cycle 27 gemcitabine/Abraxane 10/17/2019  Cycle 28 gemcitabine/Abraxane 10/31/2019  Cycle 29 gemcitabine/Abraxane 11/14/2019  Cycle 30 gemcitabine/Abraxane 11/28/2019  Cycle 31 gemcitabine/Abraxane 12/12/2019  CT abdomen/pelvis 12/25/2019-stable right hepatic lesion, no new liver lesions, stable pancreas tail mass, tiny bilateral lung base nodules, new compared to a chest CT 09/25/2017 and some have progressed compared to a's CT abdomen 08/05/2019  Cycle 32  gemcitabine/Abraxane 01/02/2020 (chemotherapy dose reduced due to neutropenia)  6.Port-A-Cath placement by Dr. 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  Neutropenia secondary to chemotherapy, chemotherapy held 12/26/2019     Disposition: Mr. Man appears unchanged.  He has neutropenia again today.  Chemotherapy will be held.  We decided to change the gemcitabine/Abraxane to an every 3-week schedule beginning 01/23/2020.  He will be scheduled for an office visit 02/13/2020. Mr. Heckmann received the COVID-19 booster vaccine today.  The CA 19-9 has stabilized since resuming Abraxane.  Neuropathy symptoms are unchanged.  He will be scheduled for a restaging CT evaluation in approximately 2 months.    Betsy Coder, MD  01/16/2020  8:45 AM

## 2020-01-17 LAB — CANCER ANTIGEN 19-9: CA 19-9: 52 U/mL — ABNORMAL HIGH (ref 0–35)

## 2020-01-19 ENCOUNTER — Telehealth: Payer: Self-pay | Admitting: Oncology

## 2020-01-19 NOTE — Telephone Encounter (Signed)
Scheduled appointments per 8/20 los. Left message for patient with appointments dates and times.

## 2020-01-23 ENCOUNTER — Other Ambulatory Visit: Payer: Self-pay

## 2020-01-23 ENCOUNTER — Inpatient Hospital Stay: Payer: Medicare Other

## 2020-01-23 VITALS — BP 119/69 | HR 62 | Temp 97.8°F | Resp 18

## 2020-01-23 DIAGNOSIS — C252 Malignant neoplasm of tail of pancreas: Secondary | ICD-10-CM

## 2020-01-23 DIAGNOSIS — Z5111 Encounter for antineoplastic chemotherapy: Secondary | ICD-10-CM | POA: Diagnosis not present

## 2020-01-23 DIAGNOSIS — Z95828 Presence of other vascular implants and grafts: Secondary | ICD-10-CM

## 2020-01-23 LAB — CBC WITH DIFFERENTIAL (CANCER CENTER ONLY)
Abs Immature Granulocytes: 0.03 10*3/uL (ref 0.00–0.07)
Basophils Absolute: 0.1 10*3/uL (ref 0.0–0.1)
Basophils Relative: 1 %
Eosinophils Absolute: 0.1 10*3/uL (ref 0.0–0.5)
Eosinophils Relative: 1 %
HCT: 41.6 % (ref 39.0–52.0)
Hemoglobin: 13.2 g/dL (ref 13.0–17.0)
Immature Granulocytes: 0 %
Lymphocytes Relative: 13 %
Lymphs Abs: 1.1 10*3/uL (ref 0.7–4.0)
MCH: 28.7 pg (ref 26.0–34.0)
MCHC: 31.7 g/dL (ref 30.0–36.0)
MCV: 90.4 fL (ref 80.0–100.0)
Monocytes Absolute: 1.3 10*3/uL — ABNORMAL HIGH (ref 0.1–1.0)
Monocytes Relative: 16 %
Neutro Abs: 5.7 10*3/uL (ref 1.7–7.7)
Neutrophils Relative %: 69 %
Platelet Count: 187 10*3/uL (ref 150–400)
RBC: 4.6 MIL/uL (ref 4.22–5.81)
RDW: 15.7 % — ABNORMAL HIGH (ref 11.5–15.5)
WBC Count: 8.2 10*3/uL (ref 4.0–10.5)
nRBC: 0 % (ref 0.0–0.2)

## 2020-01-23 LAB — CMP (CANCER CENTER ONLY)
ALT: 28 U/L (ref 0–44)
AST: 73 U/L — ABNORMAL HIGH (ref 15–41)
Albumin: 2.7 g/dL — ABNORMAL LOW (ref 3.5–5.0)
Alkaline Phosphatase: 93 U/L (ref 38–126)
Anion gap: 9 (ref 5–15)
BUN: 16 mg/dL (ref 8–23)
CO2: 29 mmol/L (ref 22–32)
Calcium: 8.5 mg/dL — ABNORMAL LOW (ref 8.9–10.3)
Chloride: 101 mmol/L (ref 98–111)
Creatinine: 1.03 mg/dL (ref 0.61–1.24)
GFR, Est AFR Am: >60 mL/min (ref >60)
GFR, Estimated: >60 mL/min (ref >60)
Glucose, Bld: 127 mg/dL — ABNORMAL HIGH (ref 70–99)
Potassium: 3.7 mmol/L (ref 3.5–5.1)
Sodium: 139 mmol/L (ref 135–145)
Total Bilirubin: 1.1 mg/dL (ref 0.3–1.2)
Total Protein: 5.7 g/dL — ABNORMAL LOW (ref 6.5–8.1)

## 2020-01-23 MED ORDER — PACLITAXEL PROTEIN-BOUND CHEMO INJECTION 100 MG
80.0000 mg/m2 | Freq: Once | INTRAVENOUS | Status: AC
  Administered 2020-01-23: 175 mg via INTRAVENOUS
  Filled 2020-01-23: qty 35

## 2020-01-23 MED ORDER — SODIUM CHLORIDE 0.9 % IV SOLN
800.0000 mg/m2 | Freq: Once | INTRAVENOUS | Status: AC
  Administered 2020-01-23: 1634 mg via INTRAVENOUS
  Filled 2020-01-23: qty 42.98

## 2020-01-23 MED ORDER — SODIUM CHLORIDE 0.9% FLUSH
10.0000 mL | INTRAVENOUS | Status: DC | PRN
  Administered 2020-01-23: 10 mL
  Filled 2020-01-23: qty 10

## 2020-01-23 MED ORDER — HEPARIN SOD (PORK) LOCK FLUSH 100 UNIT/ML IV SOLN
500.0000 [IU] | Freq: Once | INTRAVENOUS | Status: DC | PRN
  Filled 2020-01-23: qty 5

## 2020-01-23 MED ORDER — SODIUM CHLORIDE 0.9 % IV SOLN
Freq: Once | INTRAVENOUS | Status: AC
  Filled 2020-01-23: qty 250

## 2020-01-23 MED ORDER — SODIUM CHLORIDE 0.9% FLUSH
10.0000 mL | INTRAVENOUS | Status: DC | PRN
  Filled 2020-01-23: qty 10

## 2020-01-23 MED ORDER — PROCHLORPERAZINE MALEATE 10 MG PO TABS
10.0000 mg | ORAL_TABLET | Freq: Once | ORAL | Status: AC
  Administered 2020-01-23: 10 mg via ORAL

## 2020-01-23 NOTE — Patient Instructions (Signed)
[facility] Discharge Instructions for Patients Receiving Chemotherapy  Today you received the following chemotherapy agents: Gemzar, Abraxane   To help prevent nausea and vomiting after your treatment, we encourage you to take your nausea medication as directed.    If you develop nausea and vomiting that is not controlled by your nausea medication, call the clinic.   BELOW ARE SYMPTOMS THAT SHOULD BE REPORTED IMMEDIATELY:  *FEVER GREATER THAN 100.5 F  *CHILLS WITH OR WITHOUT FEVER  NAUSEA AND VOMITING THAT IS NOT CONTROLLED WITH YOUR NAUSEA MEDICATION  *UNUSUAL SHORTNESS OF BREATH  *UNUSUAL BRUISING OR BLEEDING  TENDERNESS IN MOUTH AND THROAT WITH OR WITHOUT PRESENCE OF ULCERS  *URINARY PROBLEMS  *BOWEL PROBLEMS  UNUSUAL RASH Items with * indicate a potential emergency and should be followed up as soon as possible.  Feel free to call the clinic should you have any questions or concerns. The clinic phone number is [phone].  Please show the [facility] at check-in to the Emergency Department and triage nurse.

## 2020-01-24 LAB — CANCER ANTIGEN 19-9: CA 19-9: 72 U/mL — ABNORMAL HIGH (ref 0–35)

## 2020-01-29 ENCOUNTER — Telehealth: Payer: Self-pay | Admitting: Oncology

## 2020-01-29 NOTE — Telephone Encounter (Signed)
Rescheduled appointments per 9/2 provider message. Left message for patient with appointment change.

## 2020-01-30 ENCOUNTER — Other Ambulatory Visit: Payer: 59

## 2020-01-30 ENCOUNTER — Ambulatory Visit: Payer: 59

## 2020-01-30 ENCOUNTER — Ambulatory Visit: Payer: 59 | Admitting: Nurse Practitioner

## 2020-02-08 ENCOUNTER — Other Ambulatory Visit: Payer: Self-pay | Admitting: Oncology

## 2020-02-13 ENCOUNTER — Inpatient Hospital Stay: Payer: Medicare Other | Attending: Nurse Practitioner | Admitting: Oncology

## 2020-02-13 ENCOUNTER — Other Ambulatory Visit: Payer: 59

## 2020-02-13 ENCOUNTER — Inpatient Hospital Stay: Payer: Medicare Other

## 2020-02-13 ENCOUNTER — Ambulatory Visit: Payer: 59

## 2020-02-13 ENCOUNTER — Ambulatory Visit: Payer: 59 | Admitting: Nurse Practitioner

## 2020-02-13 ENCOUNTER — Other Ambulatory Visit: Payer: Self-pay

## 2020-02-13 VITALS — BP 127/76 | HR 75 | Temp 97.8°F | Resp 20 | Ht 66.0 in | Wt 217.2 lb

## 2020-02-13 DIAGNOSIS — C252 Malignant neoplasm of tail of pancreas: Secondary | ICD-10-CM

## 2020-02-13 DIAGNOSIS — Z5111 Encounter for antineoplastic chemotherapy: Secondary | ICD-10-CM | POA: Diagnosis present

## 2020-02-13 DIAGNOSIS — Z23 Encounter for immunization: Secondary | ICD-10-CM | POA: Insufficient documentation

## 2020-02-13 DIAGNOSIS — C787 Secondary malignant neoplasm of liver and intrahepatic bile duct: Secondary | ICD-10-CM | POA: Insufficient documentation

## 2020-02-13 DIAGNOSIS — Z95828 Presence of other vascular implants and grafts: Secondary | ICD-10-CM

## 2020-02-13 LAB — CMP (CANCER CENTER ONLY)
ALT: 28 U/L (ref 0–44)
AST: 78 U/L — ABNORMAL HIGH (ref 15–41)
Albumin: 2.4 g/dL — ABNORMAL LOW (ref 3.5–5.0)
Alkaline Phosphatase: 131 U/L — ABNORMAL HIGH (ref 38–126)
Anion gap: 9 (ref 5–15)
BUN: 17 mg/dL (ref 8–23)
CO2: 26 mmol/L (ref 22–32)
Calcium: 8 mg/dL — ABNORMAL LOW (ref 8.9–10.3)
Chloride: 100 mmol/L (ref 98–111)
Creatinine: 1.19 mg/dL (ref 0.61–1.24)
GFR, Est AFR Am: >60 mL/min (ref >60)
GFR, Estimated: >60 mL/min (ref >60)
Glucose, Bld: 111 mg/dL — ABNORMAL HIGH (ref 70–99)
Potassium: 3.7 mmol/L (ref 3.5–5.1)
Sodium: 135 mmol/L (ref 135–145)
Total Bilirubin: 1.3 mg/dL — ABNORMAL HIGH (ref 0.3–1.2)
Total Protein: 5.7 g/dL — ABNORMAL LOW (ref 6.5–8.1)

## 2020-02-13 LAB — CBC WITH DIFFERENTIAL (CANCER CENTER ONLY)
Abs Immature Granulocytes: 0.07 10*3/uL (ref 0.00–0.07)
Basophils Absolute: 0.1 10*3/uL (ref 0.0–0.1)
Basophils Relative: 1 %
Eosinophils Absolute: 0 10*3/uL (ref 0.0–0.5)
Eosinophils Relative: 0 %
HCT: 39.7 % (ref 39.0–52.0)
Hemoglobin: 13 g/dL (ref 13.0–17.0)
Immature Granulocytes: 1 %
Lymphocytes Relative: 12 %
Lymphs Abs: 1.3 10*3/uL (ref 0.7–4.0)
MCH: 28.8 pg (ref 26.0–34.0)
MCHC: 32.7 g/dL (ref 30.0–36.0)
MCV: 88 fL (ref 80.0–100.0)
Monocytes Absolute: 1.5 10*3/uL — ABNORMAL HIGH (ref 0.1–1.0)
Monocytes Relative: 15 %
Neutro Abs: 7.3 10*3/uL (ref 1.7–7.7)
Neutrophils Relative %: 71 %
Platelet Count: 180 10*3/uL (ref 150–400)
RBC: 4.51 MIL/uL (ref 4.22–5.81)
RDW: 16.9 % — ABNORMAL HIGH (ref 11.5–15.5)
WBC Count: 10.2 10*3/uL (ref 4.0–10.5)
nRBC: 0 % (ref 0.0–0.2)

## 2020-02-13 MED ORDER — SODIUM CHLORIDE 0.9% FLUSH
10.0000 mL | INTRAVENOUS | Status: DC | PRN
  Administered 2020-02-13: 10 mL
  Filled 2020-02-13: qty 10

## 2020-02-13 MED ORDER — INFLUENZA VAC A&B SA ADJ QUAD 0.5 ML IM PRSY
0.5000 mL | PREFILLED_SYRINGE | Freq: Once | INTRAMUSCULAR | Status: AC
  Administered 2020-02-13: 0.5 mL via INTRAMUSCULAR

## 2020-02-13 MED ORDER — PROCHLORPERAZINE MALEATE 10 MG PO TABS
10.0000 mg | ORAL_TABLET | Freq: Once | ORAL | Status: AC
  Administered 2020-02-13: 10 mg via ORAL

## 2020-02-13 MED ORDER — SODIUM CHLORIDE 0.9 % IV SOLN
800.0000 mg/m2 | Freq: Once | INTRAVENOUS | Status: AC
  Administered 2020-02-13: 1634 mg via INTRAVENOUS
  Filled 2020-02-13: qty 42.98

## 2020-02-13 MED ORDER — HEPARIN SOD (PORK) LOCK FLUSH 100 UNIT/ML IV SOLN
500.0000 [IU] | Freq: Once | INTRAVENOUS | Status: AC | PRN
  Administered 2020-02-13: 500 [IU]
  Filled 2020-02-13: qty 5

## 2020-02-13 MED ORDER — INFLUENZA VAC A&B SA ADJ QUAD 0.5 ML IM PRSY
PREFILLED_SYRINGE | INTRAMUSCULAR | Status: AC
  Filled 2020-02-13: qty 0.5

## 2020-02-13 MED ORDER — SODIUM CHLORIDE 0.9 % IV SOLN
Freq: Once | INTRAVENOUS | Status: AC
  Filled 2020-02-13: qty 250

## 2020-02-13 MED ORDER — PROCHLORPERAZINE MALEATE 10 MG PO TABS
ORAL_TABLET | ORAL | Status: AC
  Filled 2020-02-13: qty 1

## 2020-02-13 NOTE — Patient Instructions (Signed)
[facility] °Discharge Instructions for Patients Receiving Chemotherapy ° °Today you received the following chemotherapy agents Gemzar ° °To help prevent nausea and vomiting after your treatment, we encourage you to take your nausea medication as directed. °  °If you develop nausea and vomiting that is not controlled by your nausea medication, call the clinic.  ° °BELOW ARE SYMPTOMS THAT SHOULD BE REPORTED IMMEDIATELY: °· *FEVER GREATER THAN 100.5 F °· *CHILLS WITH OR WITHOUT FEVER °· NAUSEA AND VOMITING THAT IS NOT CONTROLLED WITH YOUR NAUSEA MEDICATION °· *UNUSUAL SHORTNESS OF BREATH °· *UNUSUAL BRUISING OR BLEEDING °· TENDERNESS IN MOUTH AND THROAT WITH OR WITHOUT PRESENCE OF ULCERS °· *URINARY PROBLEMS °· *BOWEL PROBLEMS °· UNUSUAL RASH °Items with * indicate a potential emergency and should be followed up as soon as possible. ° °Feel free to call the clinic should you have any questions or concerns. The clinic phone number is [phone]. ° °Please show the CHEMO ALERT CARD at check-in to the Emergency Department and triage nurse. ° ° °

## 2020-02-13 NOTE — Progress Notes (Signed)
Per Dr. Stevphen Meuse to treat with increase in AST, T-bili and alk phos. Will also hold abraxane due to neuropathy.

## 2020-02-13 NOTE — Progress Notes (Signed)
[facility] OFFICE PROGRESS NOTE   Diagnosis: Pancreas cancer  INTERVAL HISTORY:   Mr. Boruff completed another cycle of gemcitabine/Abraxane on 01/23/2020.  No rash or fever.  He has stable numbness in the fingers and feet.  He has no difficulty with fine motor skills at the fingers, but stumbles when he walks.  Objective:  Vital signs in last 24 hours:  Blood pressure 127/76, pulse 75, temperature 97.8 F (36.6 C), temperature source Tympanic, resp. rate 20, height 5' 6" (1.676 m), weight 217 lb 3.2 oz (98.5 kg), SpO2 97 %.    HEENT: No thrush or ulcers Resp: Lungs with decreased breath sounds and rhonchi at the right posterior base, no respiratory distress Cardio: Regular rate and rhythm GI: No hepatomegaly, left lower quadrant colostomy, no mass Vascular: Trace pitting edema at the lower leg bilaterally Neuro: Mild to moderate loss of vibratory sense at the fingertips bilaterally   Portacath/PICC-without erythema  Lab Results:  Lab Results  Component Value Date   WBC 10.2 02/13/2020   HGB 13.0 02/13/2020   HCT 39.7 02/13/2020   MCV 88.0 02/13/2020   PLT 180 02/13/2020   NEUTROABS 7.3 02/13/2020    CMP  Lab Results  Component Value Date   NA 135 02/13/2020   K 3.7 02/13/2020   CL 100 02/13/2020   CO2 26 02/13/2020   GLUCOSE 111 (H) 02/13/2020   BUN 17 02/13/2020   CREATININE 1.19 02/13/2020   CALCIUM 8.0 (L) 02/13/2020   PROT 5.7 (L) 02/13/2020   ALBUMIN 2.4 (L) 02/13/2020   AST 78 (H) 02/13/2020   ALT 28 02/13/2020   ALKPHOS 131 (H) 02/13/2020   BILITOT 1.3 (H) 02/13/2020   GFRNONAA >60 02/13/2020   GFRAA >60 02/13/2020    Lab Results  Component Value Date   CEA1 1.19 08/19/2018     Medications: I have reviewed the patient's current medications.   Assessment/Plan: 1. 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 Well-differentiated adenocarcinoma, 0/13  lymph nodes positive, MSI-stable, no loss of mismatch repair protein expression ? Tumor involving the rectosigmoid junction ? Sigmoidoscopy 09/18/2017-distal sigmoid colon tumor beginning between 15 and 20 cm from the dentate line, completely obstructing ? CT abdomen/pelvis 09/14/2017-sigmoid thickening no evidence of metastatic disease ? 2 mm right upper lobe nodule on CT 08/04/2017 ? Colonoscopy 02/07/2018-polyps removed from the cecum, ascending colon, transverse colon, descending colon, rectosigmoid colon and rectum (tubular adenomas)  2. Right lung pneumonia/empyema March 2019  Right VATS, drainage of empyema, and decortication of the right lower lobe 08/31/2017  3.CVA in 2010  4.Rectal and active sigmoid polyps noted on flexible sigmoidoscopy 09/18/2017  5.Pancreas tail mass/right liver lesions and 4 mm splenic lesion on CT abdomen/pelvis 05/27/2018  MRI abdomen 07/27/2018-right liver lesions, rim-enhancing lesion in the pancreas tail  Ultrasound-guided biopsy of a right liver lesion 08/14/2018-metastatic adenocarcinoma, cytokeratin 7+, CDX-2 weak positive, cytokeratin 20 negative, consistent with metastatic pancreas cancer; MSI stable; tumor mutational burden 0  CT abdomen/pelvis 09/26/2018-increased size of previously noted liver lesions, new right liver lesion, 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  Cycle 6 gemcitabine/Abraxane 12/20/2018  Cycle 7 gemcitabine/Abraxane 01/03/2019  Cycle 8 gemcitabine/Abraxane  01/17/2019  Cycle 9 gemcitabine/Abraxane 01/31/2019  Cycle 10 gemcitabine/Abraxane  02/13/2019  Cycle 11 gemcitabine/Abraxane 02/28/2019 (Abraxane held secondary to neuropathy symptoms)  Cycle 12 gemcitabine/Abraxane 03/14/2019 (Abraxane held due to persistent neuropathy symptoms)  Cycle 13 gemcitabine/Abraxane 03/28/2019 (Abraxane held due to persistent neuropathy symptoms)  Cycle 14 gemcitabine/Abraxane 04/11/2019 (Abraxane held due to persistent neuropathy symptoms)  CT abdomen/pelvis 04/28/2019-decrease in size of pancreatic mass. Decrease in size of hepatic metastatic foci.   Cycle 15 gemcitabine12/05/2018(Abraxane held due to neuropathy symptoms)  Cycle 16 gemcitabine 05/16/2019 (Abraxane held due to neuropathy symptoms)  Cycle 17 gemcitabine 05/31/2019 (Abraxane on hold due to neuropathy symptoms)  Cycle 18 gemcitabine 06/13/2019 (Abraxane held)  Cycle 19 gemcitabine 06/27/2019 (Abraxane held)  Cycle 20 gemcitabine 07/11/2019 (Abraxane held)  Cycle 21 gemcitabine 07/25/2019 (Abraxane held)  CTabdomen/pelvis 08/05/2019-stable small liver lesions, stable pancreas mass, no evidence of disease progression  Cycle 22 gemcitabine 08/08/2019 (Abraxane held)  Cycle 23 gemcitabine 08/22/2019 (Abraxane held)  Cycle 24 gemcitabine 09/05/2019 (Abraxane held)  Cycle 25 gemcitabine 09/19/2019 (Abraxane held)  CT abdomen/pelvis 09/30/2019-no change in pancreas mass, slight enlargement of right liver metastases, largest measuring 1 cm, no new lesions  Cycle 26 gemcitabine/Abraxane 10/03/2019-Abraxane resumed at a dose of 100 mg/m  Cycle 27 gemcitabine/Abraxane 10/17/2019  Cycle 28 gemcitabine/Abraxane 10/31/2019  Cycle 29 gemcitabine/Abraxane 11/14/2019  Cycle 30 gemcitabine/Abraxane 11/28/2019  Cycle 31 gemcitabine/Abraxane 12/12/2019  CT abdomen/pelvis 12/25/2019-stable right hepatic lesion, no new liver lesions, stable pancreas tail mass, tiny bilateral lung base nodules, new compared to a chest CT 09/25/2017 and some have progressed compared to a's CT abdomen 08/05/2019  Cycle  32 gemcitabine/Abraxane 01/02/2020 (chemotherapy dose reduced due to neutropenia)  Cycle 33 gemcitabine/Abraxane 01/23/2020  Cycle 34 gemcitabine 02/13/2020 (Abraxane held secondary to neuropathy)  6.Port-A-Cath placement by Dr. 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  Neutropenia secondary to chemotherapy, chemotherapy held 12/26/2019   Disposition: Mr. Mccalla appears unchanged.  He has persistent neuropathy symptoms.  He did not have neuropathy prior to beginning chemotherapy.  The plan is to complete a cycle of gemcitabine today.  He will then undergo a restaging CT evaluation.  Gabapentin has not helped the neuropathy symptoms.  He will discontinue gabapentin.  Mr. Yordy will return for an office visit and chemotherapy in 3 weeks.  He will receive an influenza vaccine today.  Betsy Coder, MD  02/13/2020  8:27 AM

## 2020-02-14 LAB — CANCER ANTIGEN 19-9: CA 19-9: 80 U/mL — ABNORMAL HIGH (ref 0–35)

## 2020-02-16 ENCOUNTER — Telehealth: Payer: Self-pay | Admitting: Oncology

## 2020-02-16 NOTE — Telephone Encounter (Signed)
Scheduled appointments per 9/17 los. Called patient, no answer. Left a message on patient's voicemail with appointments dates and times.

## 2020-02-27 ENCOUNTER — Telehealth: Payer: Self-pay | Admitting: *Deleted

## 2020-02-27 NOTE — Telephone Encounter (Signed)
Called to report her father is sleeping a lot and not eating well. Not able to come to appointment next week. Asking to be called and put on speaker phone when he is being seen next week. Noted her request on appointment note.

## 2020-02-29 ENCOUNTER — Other Ambulatory Visit: Payer: Self-pay | Admitting: Oncology

## 2020-03-03 ENCOUNTER — Inpatient Hospital Stay (HOSPITAL_COMMUNITY)
Admission: EM | Admit: 2020-03-03 | Discharge: 2020-03-09 | DRG: 433 | Disposition: A | Discharge status: Home Health | Payer: Medicare Other | Attending: Internal Medicine | Admitting: Internal Medicine

## 2020-03-03 ENCOUNTER — Emergency Department (HOSPITAL_COMMUNITY): Payer: Medicare Other

## 2020-03-03 ENCOUNTER — Inpatient Hospital Stay: Payer: Medicare Other

## 2020-03-03 ENCOUNTER — Other Ambulatory Visit: Payer: Self-pay

## 2020-03-03 ENCOUNTER — Inpatient Hospital Stay (HOSPITAL_COMMUNITY): Payer: Medicare Other

## 2020-03-03 ENCOUNTER — Telehealth: Payer: Self-pay | Admitting: Emergency Medicine

## 2020-03-03 ENCOUNTER — Encounter (HOSPITAL_COMMUNITY): Payer: Self-pay

## 2020-03-03 DIAGNOSIS — W19XXXA Unspecified fall, initial encounter: Secondary | ICD-10-CM | POA: Diagnosis present

## 2020-03-03 DIAGNOSIS — C187 Malignant neoplasm of sigmoid colon: Secondary | ICD-10-CM | POA: Diagnosis present

## 2020-03-03 DIAGNOSIS — I10 Essential (primary) hypertension: Secondary | ICD-10-CM | POA: Diagnosis present

## 2020-03-03 DIAGNOSIS — D6959 Other secondary thrombocytopenia: Secondary | ICD-10-CM | POA: Diagnosis present

## 2020-03-03 DIAGNOSIS — Z7189 Other specified counseling: Secondary | ICD-10-CM | POA: Diagnosis not present

## 2020-03-03 DIAGNOSIS — Y929 Unspecified place or not applicable: Secondary | ICD-10-CM

## 2020-03-03 DIAGNOSIS — Z20822 Contact with and (suspected) exposure to covid-19: Secondary | ICD-10-CM | POA: Diagnosis present

## 2020-03-03 DIAGNOSIS — R7881 Bacteremia: Secondary | ICD-10-CM | POA: Diagnosis present

## 2020-03-03 DIAGNOSIS — R531 Weakness: Secondary | ICD-10-CM | POA: Diagnosis present

## 2020-03-03 DIAGNOSIS — K766 Portal hypertension: Secondary | ICD-10-CM | POA: Diagnosis present

## 2020-03-03 DIAGNOSIS — Z8673 Personal history of transient ischemic attack (TIA), and cerebral infarction without residual deficits: Secondary | ICD-10-CM

## 2020-03-03 DIAGNOSIS — C252 Malignant neoplasm of tail of pancreas: Secondary | ICD-10-CM | POA: Diagnosis present

## 2020-03-03 DIAGNOSIS — Y92009 Unspecified place in unspecified non-institutional (private) residence as the place of occurrence of the external cause: Secondary | ICD-10-CM

## 2020-03-03 DIAGNOSIS — R112 Nausea with vomiting, unspecified: Secondary | ICD-10-CM | POA: Diagnosis not present

## 2020-03-03 DIAGNOSIS — Z9049 Acquired absence of other specified parts of digestive tract: Secondary | ICD-10-CM

## 2020-03-03 DIAGNOSIS — D649 Anemia, unspecified: Secondary | ICD-10-CM | POA: Diagnosis present

## 2020-03-03 DIAGNOSIS — Z515 Encounter for palliative care: Secondary | ICD-10-CM | POA: Diagnosis not present

## 2020-03-03 DIAGNOSIS — Z7902 Long term (current) use of antithrombotics/antiplatelets: Secondary | ICD-10-CM

## 2020-03-03 DIAGNOSIS — R188 Other ascites: Secondary | ICD-10-CM | POA: Diagnosis present

## 2020-03-03 DIAGNOSIS — R140 Abdominal distension (gaseous): Secondary | ICD-10-CM | POA: Diagnosis not present

## 2020-03-03 DIAGNOSIS — I129 Hypertensive chronic kidney disease with stage 1 through stage 4 chronic kidney disease, or unspecified chronic kidney disease: Secondary | ICD-10-CM | POA: Diagnosis present

## 2020-03-03 DIAGNOSIS — I8510 Secondary esophageal varices without bleeding: Secondary | ICD-10-CM | POA: Diagnosis present

## 2020-03-03 DIAGNOSIS — C787 Secondary malignant neoplasm of liver and intrahepatic bile duct: Secondary | ICD-10-CM | POA: Diagnosis present

## 2020-03-03 DIAGNOSIS — T451X5A Adverse effect of antineoplastic and immunosuppressive drugs, initial encounter: Secondary | ICD-10-CM | POA: Diagnosis present

## 2020-03-03 DIAGNOSIS — N179 Acute kidney failure, unspecified: Secondary | ICD-10-CM

## 2020-03-03 DIAGNOSIS — E785 Hyperlipidemia, unspecified: Secondary | ICD-10-CM | POA: Diagnosis present

## 2020-03-03 DIAGNOSIS — Z823 Family history of stroke: Secondary | ICD-10-CM

## 2020-03-03 DIAGNOSIS — G629 Polyneuropathy, unspecified: Secondary | ICD-10-CM | POA: Diagnosis present

## 2020-03-03 DIAGNOSIS — Z7982 Long term (current) use of aspirin: Secondary | ICD-10-CM

## 2020-03-03 DIAGNOSIS — N182 Chronic kidney disease, stage 2 (mild): Secondary | ICD-10-CM | POA: Diagnosis present

## 2020-03-03 DIAGNOSIS — R4701 Aphasia: Secondary | ICD-10-CM | POA: Diagnosis present

## 2020-03-03 DIAGNOSIS — E7800 Pure hypercholesterolemia, unspecified: Secondary | ICD-10-CM | POA: Diagnosis present

## 2020-03-03 DIAGNOSIS — J189 Pneumonia, unspecified organism: Secondary | ICD-10-CM

## 2020-03-03 DIAGNOSIS — I864 Gastric varices: Secondary | ICD-10-CM | POA: Diagnosis present

## 2020-03-03 DIAGNOSIS — Z808 Family history of malignant neoplasm of other organs or systems: Secondary | ICD-10-CM

## 2020-03-03 DIAGNOSIS — D701 Agranulocytosis secondary to cancer chemotherapy: Secondary | ICD-10-CM | POA: Diagnosis present

## 2020-03-03 DIAGNOSIS — R180 Malignant ascites: Secondary | ICD-10-CM

## 2020-03-03 DIAGNOSIS — Z933 Colostomy status: Secondary | ICD-10-CM

## 2020-03-03 DIAGNOSIS — K7460 Unspecified cirrhosis of liver: Secondary | ICD-10-CM | POA: Diagnosis present

## 2020-03-03 DIAGNOSIS — I2510 Atherosclerotic heart disease of native coronary artery without angina pectoris: Secondary | ICD-10-CM | POA: Diagnosis present

## 2020-03-03 DIAGNOSIS — B957 Other staphylococcus as the cause of diseases classified elsewhere: Secondary | ICD-10-CM | POA: Diagnosis present

## 2020-03-03 DIAGNOSIS — Z833 Family history of diabetes mellitus: Secondary | ICD-10-CM

## 2020-03-03 DIAGNOSIS — Z8249 Family history of ischemic heart disease and other diseases of the circulatory system: Secondary | ICD-10-CM

## 2020-03-03 DIAGNOSIS — Z79899 Other long term (current) drug therapy: Secondary | ICD-10-CM

## 2020-03-03 LAB — COMPREHENSIVE METABOLIC PANEL
ALT: 34 U/L (ref 0–44)
AST: 61 U/L — ABNORMAL HIGH (ref 15–41)
Albumin: 2.5 g/dL — ABNORMAL LOW (ref 3.5–5.0)
Alkaline Phosphatase: 167 U/L — ABNORMAL HIGH (ref 38–126)
Anion gap: 8 (ref 5–15)
BUN: 42 mg/dL — ABNORMAL HIGH (ref 8–23)
CO2: 23 mmol/L (ref 22–32)
Calcium: 7.8 mg/dL — ABNORMAL LOW (ref 8.9–10.3)
Chloride: 100 mmol/L (ref 98–111)
Creatinine, Ser: 1.9 mg/dL — ABNORMAL HIGH (ref 0.61–1.24)
GFR calc non Af Amer: 35 mL/min — ABNORMAL LOW (ref >60)
Glucose, Bld: 158 mg/dL — ABNORMAL HIGH (ref 70–99)
Potassium: 5.1 mmol/L (ref 3.5–5.1)
Sodium: 131 mmol/L — ABNORMAL LOW (ref 135–145)
Total Bilirubin: 1.6 mg/dL — ABNORMAL HIGH (ref 0.3–1.2)
Total Protein: 6.3 g/dL — ABNORMAL LOW (ref 6.5–8.1)

## 2020-03-03 LAB — CBC WITH DIFFERENTIAL/PLATELET
Abs Immature Granulocytes: 0.07 10*3/uL (ref 0.00–0.07)
Basophils Absolute: 0.1 10*3/uL (ref 0.0–0.1)
Basophils Relative: 1 %
Eosinophils Absolute: 0.2 10*3/uL (ref 0.0–0.5)
Eosinophils Relative: 1 %
HCT: 40.4 % (ref 39.0–52.0)
Hemoglobin: 13.1 g/dL (ref 13.0–17.0)
Immature Granulocytes: 1 %
Lymphocytes Relative: 7 %
Lymphs Abs: 0.9 10*3/uL (ref 0.7–4.0)
MCH: 30 pg (ref 26.0–34.0)
MCHC: 32.4 g/dL (ref 30.0–36.0)
MCV: 92.7 fL (ref 80.0–100.0)
Monocytes Absolute: 2.5 10*3/uL — ABNORMAL HIGH (ref 0.1–1.0)
Monocytes Relative: 18 %
Neutro Abs: 9.7 10*3/uL — ABNORMAL HIGH (ref 1.7–7.7)
Neutrophils Relative %: 72 %
Platelets: 255 10*3/uL (ref 150–400)
RBC: 4.36 MIL/uL (ref 4.22–5.81)
RDW: 19.8 % — ABNORMAL HIGH (ref 11.5–15.5)
WBC: 13.4 10*3/uL — ABNORMAL HIGH (ref 4.0–10.5)
nRBC: 0 % (ref 0.0–0.2)

## 2020-03-03 LAB — BODY FLUID CELL COUNT WITH DIFFERENTIAL
Eos, Fluid: 0 %
Lymphs, Fluid: 6 %
Monocyte-Macrophage-Serous Fluid: 88 % (ref 50–90)
Neutrophil Count, Fluid: 6 % (ref 0–25)
Total Nucleated Cell Count, Fluid: 125 cu mm (ref 0–1000)

## 2020-03-03 LAB — LACTATE DEHYDROGENASE, PLEURAL OR PERITONEAL FLUID: LD, Fluid: 29 U/L — ABNORMAL HIGH (ref 3–23)

## 2020-03-03 LAB — HEMOGLOBIN A1C
Hgb A1c MFr Bld: 5.6 % (ref 4.8–5.6)
Mean Plasma Glucose: 114.02 mg/dL

## 2020-03-03 LAB — TSH: TSH: 3.191 u[IU]/mL (ref 0.350–4.500)

## 2020-03-03 LAB — CALCIUM: Calcium: 8 mg/dL — ABNORMAL LOW (ref 8.9–10.3)

## 2020-03-03 LAB — MAGNESIUM: Magnesium: 2.9 mg/dL — ABNORMAL HIGH (ref 1.7–2.4)

## 2020-03-03 LAB — RESPIRATORY PANEL BY RT PCR (FLU A&B, COVID)
Influenza A by PCR: NEGATIVE
Influenza B by PCR: NEGATIVE
SARS Coronavirus 2 by RT PCR: NEGATIVE

## 2020-03-03 LAB — PHOSPHORUS: Phosphorus: 3.9 mg/dL (ref 2.5–4.6)

## 2020-03-03 LAB — BRAIN NATRIURETIC PEPTIDE: B Natriuretic Peptide: 105.7 pg/mL — ABNORMAL HIGH (ref 0.0–100.0)

## 2020-03-03 LAB — APTT: aPTT: 49 seconds — ABNORMAL HIGH (ref 24–36)

## 2020-03-03 LAB — GLUCOSE, PLEURAL OR PERITONEAL FLUID: Glucose, Fluid: 142 mg/dL

## 2020-03-03 LAB — PROTEIN, PLEURAL OR PERITONEAL FLUID: Total protein, fluid: <3 g/dL

## 2020-03-03 LAB — ALBUMIN, PLEURAL OR PERITONEAL FLUID: Albumin, Fluid: <1 g/dL

## 2020-03-03 LAB — AMYLASE, PLEURAL OR PERITONEAL FLUID: Amylase, Fluid: 30 U/L

## 2020-03-03 LAB — PROTIME-INR
INR: 1.3 — ABNORMAL HIGH (ref 0.8–1.2)
Prothrombin Time: 15.4 seconds — ABNORMAL HIGH (ref 11.4–15.2)

## 2020-03-03 LAB — HIV ANTIBODY (ROUTINE TESTING W REFLEX): HIV Screen 4th Generation wRfx: NONREACTIVE

## 2020-03-03 MED ORDER — DOCUSATE SODIUM 100 MG PO CAPS
100.0000 mg | ORAL_CAPSULE | Freq: Two times a day (BID) | ORAL | Status: DC
  Administered 2020-03-03 – 2020-03-09 (×13): 100 mg via ORAL
  Filled 2020-03-03 (×13): qty 1

## 2020-03-03 MED ORDER — ONDANSETRON HCL 4 MG/2ML IJ SOLN: 4.0000 mg | Freq: Four times a day (QID) | INTRAMUSCULAR | Status: DC | PRN

## 2020-03-03 MED ORDER — SODIUM CHLORIDE 0.9 % IV SOLN: INTRAVENOUS | Status: DC

## 2020-03-03 MED ORDER — OXYCODONE HCL 5 MG PO TABS: 5.0000 mg | ORAL_TABLET | ORAL | Status: DC | PRN

## 2020-03-03 MED ORDER — SODIUM CHLORIDE 0.9% FLUSH
3.0000 mL | Freq: Two times a day (BID) | INTRAVENOUS | Status: DC
  Administered 2020-03-03 – 2020-03-09 (×8): 3 mL via INTRAVENOUS

## 2020-03-03 MED ORDER — HYDROMORPHONE HCL 1 MG/ML IJ SOLN: 0.5000 mg | INTRAMUSCULAR | Status: DC | PRN

## 2020-03-03 MED ORDER — SODIUM CHLORIDE 0.9 % IV SOLN
2.0000 g | Freq: Once | INTRAVENOUS | Status: AC
  Administered 2020-03-03: 2 g via INTRAVENOUS
  Filled 2020-03-03: qty 2

## 2020-03-03 MED ORDER — VANCOMYCIN HCL 2000 MG/400ML IV SOLN
2000.0000 mg | Freq: Once | INTRAVENOUS | Status: AC
  Administered 2020-03-03: 2000 mg via INTRAVENOUS
  Filled 2020-03-03 (×2): qty 400

## 2020-03-03 MED ORDER — ALBUTEROL SULFATE (2.5 MG/3ML) 0.083% IN NEBU: 2.5000 mg | INHALATION_SOLUTION | RESPIRATORY_TRACT | Status: DC | PRN

## 2020-03-03 MED ORDER — LIDOCAINE HCL 1 % IJ SOLN
INTRAMUSCULAR | Status: AC
  Filled 2020-03-03: qty 20

## 2020-03-03 MED ORDER — CLOPIDOGREL BISULFATE 75 MG PO TABS
75.0000 mg | ORAL_TABLET | Freq: Every day | ORAL | Status: DC
  Administered 2020-03-04 – 2020-03-09 (×6): 75 mg via ORAL
  Filled 2020-03-03 (×6): qty 1

## 2020-03-03 MED ORDER — ASPIRIN 81 MG PO CHEW
81.0000 mg | CHEWABLE_TABLET | Freq: Every day | ORAL | Status: DC
  Administered 2020-03-03 – 2020-03-09 (×7): 81 mg via ORAL
  Filled 2020-03-03 (×7): qty 1

## 2020-03-03 MED ORDER — AMLODIPINE BESYLATE 10 MG PO TABS
10.0000 mg | ORAL_TABLET | Freq: Every day | ORAL | Status: DC
  Administered 2020-03-04 – 2020-03-09 (×6): 10 mg via ORAL
  Filled 2020-03-03 (×6): qty 1

## 2020-03-03 MED ORDER — LEVALBUTEROL HCL 0.63 MG/3ML IN NEBU: 0.6300 mg | INHALATION_SOLUTION | Freq: Four times a day (QID) | RESPIRATORY_TRACT | Status: DC | PRN

## 2020-03-03 MED ORDER — SODIUM CHLORIDE 0.9 % IV SOLN
2.0000 g | INTRAVENOUS | Status: DC
  Administered 2020-03-04 – 2020-03-06 (×3): 2 g via INTRAVENOUS
  Filled 2020-03-03: qty 2
  Filled 2020-03-03: qty 20
  Filled 2020-03-03 (×2): qty 2

## 2020-03-03 MED ORDER — FUROSEMIDE 10 MG/ML IJ SOLN
20.0000 mg | Freq: Three times a day (TID) | INTRAMUSCULAR | Status: DC
  Administered 2020-03-03 – 2020-03-07 (×10): 20 mg via INTRAVENOUS
  Filled 2020-03-03 (×7): qty 2
  Filled 2020-03-03: qty 4
  Filled 2020-03-03 (×2): qty 2

## 2020-03-03 MED ORDER — SIMVASTATIN 20 MG PO TABS
20.0000 mg | ORAL_TABLET | Freq: Every day | ORAL | Status: DC
  Administered 2020-03-03 – 2020-03-08 (×6): 20 mg via ORAL
  Filled 2020-03-03 (×7): qty 1

## 2020-03-03 MED ORDER — SORBITOL 70 % SOLN
30.0000 mL | Freq: Every day | Status: DC | PRN
  Filled 2020-03-03: qty 30

## 2020-03-03 MED ORDER — ALBUMIN HUMAN 25 % IV SOLN
25.0000 g | Freq: Four times a day (QID) | INTRAVENOUS | Status: AC
  Administered 2020-03-03 – 2020-03-04 (×4): 25 g via INTRAVENOUS
  Filled 2020-03-03 (×4): qty 100

## 2020-03-03 MED ORDER — GABAPENTIN 300 MG PO CAPS
300.0000 mg | ORAL_CAPSULE | Freq: Every day | ORAL | Status: DC
  Administered 2020-03-03: 300 mg via ORAL
  Filled 2020-03-03: qty 1

## 2020-03-03 MED ORDER — ONDANSETRON HCL 4 MG PO TABS: 4.0000 mg | ORAL_TABLET | Freq: Four times a day (QID) | ORAL | Status: DC | PRN

## 2020-03-03 MED ORDER — ATENOLOL 25 MG PO TABS
25.0000 mg | ORAL_TABLET | Freq: Every day | ORAL | Status: DC
  Administered 2020-03-04 – 2020-03-09 (×6): 25 mg via ORAL
  Filled 2020-03-03 (×7): qty 1

## 2020-03-03 MED ORDER — ACETAMINOPHEN 325 MG PO TABS: 650.0000 mg | ORAL_TABLET | Freq: Four times a day (QID) | ORAL | Status: DC | PRN

## 2020-03-03 MED ORDER — SENNOSIDES-DOCUSATE SODIUM 8.6-50 MG PO TABS: 1.0000 | ORAL_TABLET | Freq: Every evening | ORAL | Status: DC | PRN

## 2020-03-03 MED ORDER — TRAZODONE HCL 50 MG PO TABS: 50.0000 mg | ORAL_TABLET | Freq: Every evening | ORAL | Status: DC | PRN

## 2020-03-03 MED ORDER — IOHEXOL 300 MG/ML  SOLN
75.0000 mL | Freq: Once | INTRAMUSCULAR | Status: AC | PRN
  Administered 2020-03-03: 75 mL via INTRAVENOUS

## 2020-03-03 MED ORDER — ACETAMINOPHEN 650 MG RE SUPP: 650.0000 mg | Freq: Four times a day (QID) | RECTAL | Status: DC | PRN

## 2020-03-03 MED ORDER — HEPARIN SODIUM (PORCINE) 5000 UNIT/ML IJ SOLN
5000.0000 [IU] | Freq: Three times a day (TID) | INTRAMUSCULAR | Status: DC
  Administered 2020-03-03 – 2020-03-09 (×19): 5000 [IU] via SUBCUTANEOUS
  Filled 2020-03-03 (×20): qty 1

## 2020-03-03 NOTE — ED Notes (Signed)
Pt transported to US

## 2020-03-03 NOTE — ED Triage Notes (Addendum)
Pt arrived via walk in, c/o slurred speak this morning, generalized weakness and stomach distention.  Pt states he was on way to drs appt and his sister noticed pt was slurring speech more than normal, called his PCP and was told to come to ED. Pt with hx of stroke. No neuro deficits in triage. Abd firm to touch.

## 2020-03-03 NOTE — Progress Notes (Signed)
A consult was received from an ED physician for vancomycin per pharmacy dosing.  The patient's profile has been reviewed for ht/wt/allergies/indication/available labs.    A one time order has been placed for vancomycin 2000 mg IV x1.  Further antibiotics/pharmacy consults should be ordered by admitting physician if indicated.                       Thank you, Lynelle Doctor 03/03/2020  2:46 PM

## 2020-03-03 NOTE — ED Provider Notes (Signed)
[facility] DEPT Provider Note   CSN: [id number] Arrival date & time: 03/03/20  1124     History Chief Complaint  Patient presents with  . Aphasia  . Weakness    Alex [name] is a 69 y.o. male presenting for evaluation of weakness.  Patient states possible days, he has felt gradually weaker.  He was going to see his doctor today, when his sister was concerned and instead brought him to the ER.  He states he has had difficulty walking due to his weakness.  He states he feels weak because he has been nauseous due to his chemo.  He denies recent fevers, chills, chest pain, shortness breath, abdominal pain, urinary symptoms, abnormal bowel movements.  Additional history obtained from patient's sister, [name].  She states today patient has been more out of it, confused but timeline of events.  She reports his speech is slightly slurred.  She reports his abdomen is much more swollen as is his extremities.  He had lots of difficulty getting to the car due to weakness.  Additional history obtained from chart review.  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  History of hypertension, CAD, hyperlipidemia, stroke, previous bowel obstruction   HPI     Past Medical History:  Diagnosis Date  . Abdominal distension 09/14/2017  . 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 Anemia 08/26/2017  . Aortic atherosclerosis ([place]) 08/27/2017  . Atherosclerosis of arteries   . Carotid artery occlusion   . Colon obstruction ([place])   . Colon polyps   . Community acquired pneumonia of right lower lobe of lung 08/04/2017  . Dehydration   . Empyema ([place]) 08/27/2017  . Empyema of right pleural space ([place]) 08/30/2017  . High cholesterol   . History of stroke 08/04/2017  . Hypertension   . Hypokalemia 09/14/2017  . Hyponatremia 08/26/2017  . Large bowel obstruction ([place]) 09/13/2017  . Lobar pneumonia ([place])  08/27/2017  . Malignant tumor of sigmoid colon ([address])   . Nausea & vomiting 09/14/2017  . Nausea and vomiting 09/14/2017  . Pleural effusion 08/25/2017  . Pleural effusion, right 08/27/2017  . Pneumonia   . Pulmonary nodule, right 08/27/2017  . Stroke [facility]) 2010   denies residual on 09/18/2017    Patient Active Problem List   Diagnosis Date Noted  . Ascites 03/03/2020  . Port-A-Cath in place 10/11/2018  . Goals of care, counseling/discussion 10/04/2018  . Cancer of pancreas, tail ([place]) 10/04/2018  . Genetic testing 08/26/2018  . Malignant tumor of sigmoid colon ([place])   . Colon obstruction ([place])   . Abdominal distension 09/14/2017  . Nausea & vomiting 09/14/2017  . Hypokalemia 09/14/2017  . HLD (hyperlipidemia) 09/14/2017  . Nausea and vomiting 09/14/2017  . Dehydration   . Empyema of right pleural space ([place]) 08/30/2017  . Lobar pneumonia ([place]) 08/27/2017  . Empyema ([place]) 08/27/2017  . Pleural effusion, right 08/27/2017  . Pulmonary nodule, right 08/27/2017  . Benign essential HTN 08/27/2017  . Aortic atherosclerosis ([place]) 08/27/2017  . Anemia 08/26/2017  . Pleural effusion 08/25/2017  . Community acquired pneumonia of right lower lobe of lung 08/04/2017  . History of stroke 08/04/2017  . Occlusion and stenosis of carotid artery without mention of cerebral infarction 09/07/2011    Past Surgical History:  Procedure Laterality Date  . ANKLE FRACTURE SURGERY Right 1984  . DECORTICATION Right 08/30/2017   Procedure: DECORTICATION of right  lower lung lobe;  Surgeon: Prescott Gum, Collier Salina, MD;  Location: [facility];  Service: Thoracic;  Laterality: Right;  . FLEXIBLE SIGMOIDOSCOPY N/A 09/18/2017   Procedure: FLEXIBLE SIGMOIDOSCOPY;  Surgeon: Jerene Bears, MD;  [facility] ENDOSCOPY;  Service: Gastroenterology;  Laterality: N/A;  . PARTIAL COLECTOMY N/A 09/20/2017   Procedure: OPEN PARTIAL COLECTOMY WITH COLOSTOMY;  Surgeon: Clovis Riley, MD;  Location: [facility];  Service: General;  Laterality:  N/A;  . PORTACATH PLACEMENT Right 09/20/2018   Procedure: INSERTION PORT-A-CATH WITH ULTRASOUND;  Surgeon: Clovis Riley, MD;  Location: [facility];  Service: General;  Laterality: Right;  Marland Kitchen VIDEO ASSISTED THORACOSCOPY (VATS)/EMPYEMA Right 08/30/2017   Procedure: VIDEO ASSISTED THORACOSCOPY (VATS)/EMPYEMA   ;  Surgeon: Ivin Poot, MD;  [facility] OR;  Service: Thoracic;  Laterality: Right;       Family History  Problem Relation Age of Onset  . Heart disease Other   . Hypertension Father   . Stroke Father   . Heart disease Father   . Diabetes Sister   . Diabetes Brother   . Head & neck cancer Brother     Social History   Tobacco Use  . Smoking status: Never Smoker  . Smokeless tobacco: Never Used  Vaping Use  . Vaping Use: Never used  Substance Use Topics  . Alcohol use: Not Currently    Alcohol/week: 0.0 standard drinks    Comment: 09/18/2017 "nothing since the 1990s"  . Drug use: Yes    Types: Marijuana    Comment: 09/18/2017 "nothing since the 1980s"    Home Medications Prior to Admission medications   Medication Sig Start Date End Date Taking? Authorizing Provider  acetaminophen (TYLENOL) 500 MG tablet Take 2 tablets (1,000 mg total) by mouth every 6 (six) hours as needed. Patient taking differently: Take 1,000 mg by mouth every 6 (six) hours as needed for moderate pain.  09/04/17   Barrett, Erin R, PA-C  amLODipine (NORVASC) 10 MG tablet Take 10 mg by mouth daily.    [provider]  aspirin 81 MG tablet Take 81 mg by mouth daily. 05/01/18   [provider]  atenolol (TENORMIN) 25 MG tablet Take 25 mg by mouth daily.    [provider]  clopidogrel (PLAVIX) 75 MG tablet Take 75 mg by mouth daily.    [provider]  gabapentin (NEURONTIN) 100 MG capsule Take 3 capsules (300 mg total) by mouth at bedtime. 12/29/19   Ladell Pier, MD  hydrochlorothiazide (HYDRODIURIL) 25 MG tablet Take 25 mg by mouth daily.     [provider]  lidocaine-prilocaine (EMLA) cream Apply 1 application topically as needed. Apply to portacath  1 1/2 -  2 hours prior to procedures as needed. 10/31/19   Owens Shark, NP  potassium chloride SA (KLOR-CON) 20 MEQ tablet Take 1 tablet (20 mEq total) by mouth daily. 07/11/19   Ladell Pier, MD  prochlorperazine (COMPAZINE) 10 MG tablet Take 1 tablet (10 mg total) by mouth every 6 (six) hours as needed for nausea or vomiting. Patient not taking: Reported on 02/13/2020 01/16/20   Ladell [place], MD  simvastatin (ZOCOR) 20 MG tablet Take 20 mg by mouth at bedtime.     [provider]    Allergies    Patient has no known allergies.  Review of Systems   Review of Systems  Cardiovascular: Positive for leg swelling.  Gastrointestinal: Positive for abdominal distention and nausea.  Allergic/Immunologic: Positive  for immunocompromised state.  Neurological: Positive for speech difficulty (per sister) and weakness.  Hematological: Bruises/bleeds easily.  All other systems reviewed and are negative.   Physical Exam Updated Vital Signs BP (!) 132/93 (BP Location: Left Arm)   Pulse 82   Temp 98.7 F (37.1 C) (Oral)   Resp 20   Ht 5\' 6"  (1.676 m)   Wt 98.9 kg   SpO2 99%   BMI 35.19 kg/m   Physical Exam Vitals and nursing note reviewed.  Constitutional:      General: He is not in acute distress.    Appearance: He is well-developed.     Comments: Appears chronically ill  HENT:     Head: Normocephalic and atraumatic.  Eyes:     Extraocular Movements: Extraocular movements intact.     Conjunctiva/sclera: Conjunctivae normal.     Pupils: Pupils are equal, round, and reactive to light.  Cardiovascular:     Rate and Rhythm: Normal rate and regular rhythm.     Pulses: Normal pulses.  Pulmonary:     Effort: Pulmonary effort is normal. No respiratory distress.     Breath sounds: Normal breath sounds. No wheezing.  Abdominal:     General: There is  distension.     Palpations: Abdomen is soft. There is no mass.     Tenderness: There is no abdominal tenderness. There is no guarding or rebound.     Comments: Distended, firm abd. No ttp. Colostomy in place. No signs of infection  Musculoskeletal:        General: Normal range of motion.     Cervical back: Normal range of motion and neck supple.     Right lower leg: Edema present.     Left lower leg: Edema present.     Comments: 2+ pitting edema of lower extremities.  Skin:    General: Skin is warm and dry.     Capillary Refill: Capillary refill takes less than 2 seconds.  Neurological:     Mental Status: He is alert and oriented to person, place, and time.     GCS: GCS eye subscore is 4. GCS verbal subscore is 5. GCS motor subscore is 6.     Cranial Nerves: Cranial nerves are intact.     Sensory: Sensation is intact.     Motor: Motor function is intact. No pronator drift.     Coordination: Coordination is intact.     Gait: Gait abnormal.     Comments: Alert and oriented.  No obvious speech abnormality.  R lower eyelid droop, otherwise CN intact.  Nose to finger intact. strength and sensation intact x4. Negative pronator drift. abnormal gait, pt states is baseline.      ED Results / Procedures / Treatments   Labs (all labs ordered are listed, but only abnormal results are displayed) Labs Reviewed  CBC WITH DIFFERENTIAL/PLATELET - Abnormal; Notable for the following components:      Result Value   WBC 13.4 (*)    RDW 19.8 (*)    Neutro Abs 9.7 (*)    Monocytes Absolute 2.5 (*)    All other components within normal limits  COMPREHENSIVE METABOLIC PANEL - Abnormal; Notable for the following components:   Sodium 131 (*)    Glucose, Bld 158 (*)    BUN 42 (*)    Creatinine, Ser 1.90 (*)    Calcium 7.8 (*)    Total Protein 6.3 (*)    Albumin 2.5 (*)    AST 61 (*)  Alkaline Phosphatase 167 (*)    Total Bilirubin 1.6 (*)    GFR calc non Af Amer 35 (*)    All other components  within normal limits  RESPIRATORY PANEL BY RT PCR (FLU A&B, COVID)  CULTURE, BLOOD (ROUTINE X 2)  CULTURE, BLOOD (ROUTINE X 2)  URINE CULTURE  EXPECTORATED SPUTUM ASSESSMENT W REFEX TO RESP CULTURE  URINALYSIS, ROUTINE W REFLEX MICROSCOPIC  CBC  CREATININE, SERUM  HIV ANTIBODY (ROUTINE TESTING W REFLEX)  CALCIUM  MAGNESIUM  PHOSPHORUS  BRAIN NATRIURETIC PEPTIDE  TSH  HEMOGLOBIN A1C  URINALYSIS, ROUTINE W REFLEX MICROSCOPIC    EKG EKG Interpretation  Date/Time:  Wednesday March 03 2020 11:35:49 EDT Ventricular Rate:  79 PR Interval:    QRS Duration: 85 QT Interval:  370 QTC Calculation: 427 R Axis:   86 Text Interpretation: Sinus rhythm Borderline right axis deviation Low voltage, extremity and precordial leads Baseline wander in lead(s) V6 12 Lead; [place] Poor data quality in current ECG precludes serial comparison Confirmed by Sherwood Gambler [phone]) on 03/03/2020 1:06:58 PM   Radiology DG Chest 2 View  Result Date: 03/03/2020 CLINICAL DATA:  Weakness. EXAM: CHEST - 2 VIEW COMPARISON:  September 20, 2018. FINDINGS: Stable cardiomediastinal silhouette. Right internal jugular Port-A-Cath is unchanged in position. No pneumothorax is noted. Left lung is clear. Elevated right hemidiaphragm is noted with mild right basilar subsegmental atelectasis or infiltrate. No significant pleural effusion is noted. Bony thorax is unremarkable. IMPRESSION: Elevated right hemidiaphragm with mild right basilar subsegmental atelectasis or infiltrate. Electronically Signed   By: Marijo Conception M.D.   On: 03/03/2020 12:30   CT Head Wo Contrast  Result Date: 03/03/2020 CLINICAL DATA:  Slurred speech, generalized weakness EXAM: CT HEAD WITHOUT CONTRAST TECHNIQUE: Contiguous axial images were obtained from the base of the skull through the vertex without intravenous contrast. COMPARISON:  MRI 02/15/2011 FINDINGS: Brain: No evidence of acute infarction, hemorrhage, hydrocephalus, extra-axial  collection or mass lesion/mass effect. Encephalomalacia within the posterior left temporoparietal region at site of previously seen infarct. Vascular: Atherosclerotic calcifications involving the large vessels of the skull base. No unexpected hyperdense vessel. Skull: Normal. Negative for fracture or focal lesion. Sinuses/Orbits: No acute finding. Other: None. IMPRESSION: No acute intracranial findings. Electronically Signed   By: Davina Poke D.O.   On: 03/03/2020 14:28   CT ABDOMEN PELVIS W CONTRAST  Result Date: 03/03/2020 CLINICAL DATA:  Abdominal distension. History of metastatic pancreas cancer. EXAM: CT ABDOMEN AND PELVIS WITH CONTRAST TECHNIQUE: Multidetector CT imaging of the abdomen and pelvis was performed using the standard protocol following bolus administration of intravenous contrast. CONTRAST:  30mL OMNIPAQUE IOHEXOL 300 MG/ML  SOLN COMPARISON:  12/25/2019 FINDINGS: Lower chest: Pleuroparenchymal scarring is again noted within the right lung. No substantial change in the appearance of tiny bilateral lung nodules within the imaged portions of the lower lung zones. No acute abnormality identified. Hepatobiliary: Previously characterized subcapsular metastatic lesion is not confidently seen on today's study. No new focal liver abnormality. Within the right hepatic lobe gallbladder unremarkable. No biliary ductal dilatation. Pancreas: No signs of pancreatic inflammation or main duct dilatation. The index lesion within distal tail of pancreas measures 2.0 x 1.6 cm, image 36/2. Previously 3.0 x 1.6 cm. Spleen: Normal in size without focal abnormality. Adrenals/Urinary Tract: Normal appearance of the adrenal glands. No kidney mass or hydronephrosis identified. Similar appearance of scarring and volume loss from the inferior pole cortex of the left kidney. Urinary bladder appears normal. Stomach/Bowel: Stomach is nondistended. No  bowel wall thickening, inflammation, or distension identified. Left  lower quadrant colostomy identified. 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 Vascular/Lymphatic: Aortic atherosclerosis. No aneurysm. The portal vein is patent. 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 No abdominopelvic adenopathy. Reproductive: Prostate is unremarkable. Other: Interval development of large volume of ascites within the abdomen and pelvis. No discrete peritoneal nodule. Musculoskeletal: No acute or significant osseous findings. IMPRESSION: 1. Interval development of large volume of ascites within the abdomen and pelvis. No discrete peritoneal nodule identified. Etiology is indeterminate. Differential considerations include (but not limited to): Portal venous hypertension, CHF, or peritoneal metastasis. Diagnostic and therapeutic paracentesis may be helpful for further investigation. 2. Decrease in size of distal tail of pancreas lesion. 3. Previously characterized subcapsular metastatic lesion within the liver is not confidently seen on today's study. No new focal liver abnormality identified. 4. Similar appearance of tiny bilateral lung nodules within the imaged portions of the lower lung zones. 5. Aortic atherosclerosis. Aortic Atherosclerosis (ICD10-I70.0). Electronically Signed   By: Kerby Moors M.D.   On: 03/03/2020 14:37    Procedures Procedures (including critical care time)  Medications Ordered in ED Medications  ceFEPIme (MAXIPIME) 2 g in sodium chloride 0.9 % 100 mL IVPB (has no administration in time range)  vancomycin (VANCOREADY) IVPB 2000 mg/400 mL (has no administration in time range)  heparin injection 5,000 Units (has no administration in time range)  sodium chloride flush (NS) 0.9 % injection 3 mL (has no administration in time range)  traZODone (DESYREL) tablet 50 mg (has no administration in time range)  docusate sodium (COLACE) capsule 100 mg (has no administration in time range)  senna-docusate (Senokot-S) tablet 1 tablet  (has no administration in time range)  sorbitol 70 % solution 30 mL (has no administration in time range)  ondansetron (ZOFRAN) tablet 4 mg (has no administration in time range)    Or  ondansetron (ZOFRAN) injection 4 mg (has no administration in time range)  albuterol (PROVENTIL) (2.5 MG/3ML) 0.083% nebulizer solution 2.5 mg (has no administration in time range)  levalbuterol (XOPENEX) nebulizer solution 0.63 mg (has no administration in time range)  acetaminophen (TYLENOL) tablet 650 mg (has no administration in time range)    Or  acetaminophen (TYLENOL) suppository 650 mg (has no administration in time range)  oxyCODONE (Oxy IR/ROXICODONE) immediate release tablet 5 mg (has no administration in time range)  HYDROmorphone (DILAUDID) injection 0.5-1 mg (has no administration in time range)  0.9 %  sodium chloride infusion (has no administration in time range)  cefTRIAXone (ROCEPHIN) 1 g in sodium chloride 0.9 % 100 mL IVPB (has no administration in time range)  iohexol (OMNIPAQUE) 300 MG/ML solution 75 mL (75 mLs Intravenous Contrast Given 03/03/20 1359)    ED Course  I have reviewed the triage vital signs and the nursing notes.  Pertinent labs & imaging results that were available during my care of the patient were reviewed by me and considered in my medical decision making (see chart for details).    MDM Rules/Calculators/A&P                          Patient presenting for evaluation of generalized weakness.  There is also concern about abdominal distention and peripheral edema.  On exam, patient was chronically ill.  His abdomen is very distended, patient states this is not atypical for him.  He does have significant pitting edema in all extremities. Neuro exam mostly reassuring,  abnormal gait, but pt states is baseline. LSN was last night, as such pt does not qualify for code stroke. Will order CT head. Labs ordered. Will order ct abd pelvis for further evaluation of distention.    Labs show mild leukocytosis of 13.4.  Chest x-ray viewed interpreted by me, does show some right lower lobe haziness, concern for atelectasis versus infiltrate.  This patient is on chemo, has mild leukocytosis, will cover for possible pneumonia.  CMP shows mild AKI, creatinine 1.9, baseline close to 1.  GFR down to 35.  Concerning in the setting of third spacing/ascites.  Will have to be hydrated slowly.  CT head negative for acute findings.   CT abdomen pelvis consistent with significant ascites, likely malignant due to patient's history.  Case discussed with attending, Dr. Regenia Skeeter evaluated the patient.  Discussed findings with patient sister.  Discussed plan for admission, they are agreeable.  Discussed with Dr. Roger Shelter from [facility] hospitalist service, pt to be admitted.   Final Clinical Impression(s) / ED Diagnoses Final diagnoses:  Weakness  AKI (acute kidney injury) ([place])  Malignant ascites  Community acquired pneumonia, unspecified laterality    Rx / DC Orders ED Discharge Orders    None       Franchot Heidelberg, PA-C 03/03/20 1512    Sherwood Gambler, MD 03/03/20 [phone]

## 2020-03-03 NOTE — Plan of Care (Signed)
Patient admitted to unit all careplans initiated

## 2020-03-03 NOTE — Procedures (Signed)
PROCEDURE SUMMARY:  Successful US guided paracentesis from right abdomen  Yielded 5L of light yellow fluid.  No immediate complications.  Pt tolerated well.   Specimen sent for labs.  EBL < 2 mL  Theresa Duty, NP 03/03/2020 4:30 PM

## 2020-03-03 NOTE — Telephone Encounter (Signed)
Received call from pt's sister [name] reporting that since yesterday (and worsening today) pt's stomach and arms are swollen, his speech is slurred, his memory is deteriorating, and he cannot walk/falls easily.  Requesting advisement on next steps.  Advised per NP Lattie Haw to call EMS and go to [facility] at this time.  [name] states she will call back with any further questions/concerns but agreed to ED at this time.

## 2020-03-03 NOTE — ED Notes (Signed)
ED TO INPATIENT HANDOFF REPORT  Name/Age/Gender Alex [name] 69 y.o. male  Code Status    Code Status Orders  (From admission, onward)         Start     Ordered   03/03/20 1504  Full code  Continuous        03/03/20 1506        Code Status History    Date Active Date Inactive Code Status Order ID Comments User Context   09/14/2017 0405 09/25/2017 2107 Full Code [id number]  Ivor Costa, MD ED   08/26/2017 0118 09/04/2017 1346 Full Code [id number]  Jani Gravel, MD Inpatient   08/04/2017 2000 08/05/2017 1543 Full Code [id number]  Etta Quill, DO ED   Advance Care Planning Activity      Home/SNF/Other Home  Chief Complaint Ascites [R18.8]  Level of Care/Admitting Diagnosis ED Disposition    ED Disposition Condition [place]: [facility] [[id number]]  Level of Care: Med-Surg [16]  May admit patient to [facility] or [facility] if equivalent level of care is available:: Yes  Covid Evaluation: Asymptomatic Screening Protocol (No Symptoms)  Diagnosis: Ascites [789381]  Admitting Physician: Acquanetta Sit  Attending Physician: Acquanetta Sit  Estimated length of stay: 3 - 4 days  Certification:: I certify this patient will need inpatient services for at least 2 midnights       Medical History Past Medical History:  Diagnosis Date  . Abdominal distension 09/14/2017  . Adenocarcinoma of sigmoid colon (Start) 2019   with involvement of rectosigmoid junction  . Anemia 08/26/2017  . Aortic atherosclerosis ([place]) 08/27/2017  . Atherosclerosis of arteries   . Carotid artery occlusion   . Colon obstruction ([place])   . Colon polyps   . Community acquired pneumonia of right lower lobe of lung 08/04/2017  . Dehydration   . Empyema ([place]) 08/27/2017  . Empyema of right pleural space ([place]) 08/30/2017  . High cholesterol   . History of stroke 08/04/2017  . Hypertension   . Hypokalemia 09/14/2017  . Hyponatremia 08/26/2017  .  Large bowel obstruction ([place]) 09/13/2017  . Lobar pneumonia ([place]) 08/27/2017  . Malignant tumor of sigmoid colon ([place])   . Nausea & vomiting 09/14/2017  . Nausea and vomiting 09/14/2017  . Pleural effusion 08/25/2017  . Pleural effusion, right 08/27/2017  . Pneumonia   . Pulmonary nodule, right 08/27/2017  . Stroke [facility]) 2010   denies residual on 09/18/2017    Allergies No Known Allergies  IV Location/Drains/Wounds Patient Lines/Drains/Airways Status    Active Line/Drains/Airways    Name Placement date Placement time Site Days   Implanted Port 09/20/18 Right Chest 09/20/18  1040  Chest  530   Peripheral IV 03/03/20 Left Antecubital 03/03/20  1709  Antecubital  less than 1   Colostomy LUQ 09/20/17  0930  LUQ  895   Incision (Closed) 08/30/17 Chest Right 08/30/17  1553   916   Incision (Closed) 09/20/17 Abdomen Other (Comment) 09/20/17  1053   895   Incision (Closed) 08/14/18 Abdomen Right;Lateral;Upper 08/14/18  0848   567   Incision (Closed) 09/20/18 Chest Right 09/20/18  1107   530          Labs/Imaging Results for orders placed or performed during the hospital encounter of 03/03/20 (from the past 48 hour(s))  Brain natriuretic peptide     Status: Abnormal   Collection Time: 03/03/20 12:30 PM  Result Value Ref Range  B Natriuretic Peptide 105.7 (H) 0.0 - 100.0 pg/mL    Comment: Performed at [facility], [place] [address], [place], [place] [address]  Calcium     Status: Abnormal   Collection Time: 03/03/20 12:37 PM  Result Value Ref Range   Calcium 8.0 (L) 8.9 - 10.3 mg/dL    Comment: Performed at [facility], [place] [address], [place], [place] [address]  Magnesium     Status: Abnormal   Collection Time: 03/03/20 12:37 PM  Result Value Ref Range   Magnesium 2.9 (H) 1.7 - 2.4 mg/dL    Comment: Performed at [facility], [place] [address] [place], [place] [address]  Phosphorus     Status: None   Collection Time: 03/03/20 12:37  PM  Result Value Ref Range   Phosphorus 3.9 2.5 - 4.6 mg/dL    Comment: Performed at [facility], [place] [address], [place], [place] [address]  CBC with Differential     Status: Abnormal   Collection Time: 03/03/20 12:38 PM  Result Value Ref Range   WBC 13.4 (H) 4.0 - 10.5 K/uL   RBC 4.36 4.22 - 5.81 MIL/uL   Hemoglobin 13.1 13.0 - 17.0 g/dL   HCT 40.4 39 - 52 %   MCV 92.7 80.0 - 100.0 fL   MCH 30.0 26.0 - 34.0 pg   MCHC 32.4 30.0 - 36.0 g/dL   RDW 19.8 (H) 11.5 - 15.5 %   Platelets 255 150 - 400 K/uL   nRBC 0.0 0.0 - 0.2 %   Neutrophils Relative % 72 %   Neutro Abs 9.7 (H) 1.7 - 7.7 K/uL   Lymphocytes Relative 7 %   Lymphs Abs 0.9 0.7 - 4.0 K/uL   Monocytes Relative 18 %   Monocytes Absolute 2.5 (H) 0 - 1 K/uL   Eosinophils Relative 1 %   Eosinophils Absolute 0.2 0 - 0 K/uL   Basophils Relative 1 %   Basophils Absolute 0.1 0 - 0 K/uL   Immature Granulocytes 1 %   Abs Immature Granulocytes 0.07 0.00 - 0.07 K/uL    Comment: Performed at [facility], [place] [address], [place], [place] [address]  Comprehensive metabolic panel     Status: Abnormal   Collection Time: 03/03/20 12:38 PM  Result Value Ref Range   Sodium 131 (L) 135 - 145 mmol/L   Potassium 5.1 3.5 - 5.1 mmol/L   Chloride 100 98 - 111 mmol/L   CO2 23 22 - 32 mmol/L   Glucose, Bld 158 (H) 70 - 99 mg/dL    Comment: Glucose reference range applies only to samples taken after fasting for at least 8 hours.   BUN 42 (H) 8 - 23 mg/dL   Creatinine, Ser 1.90 (H) 0.61 - 1.24 mg/dL   Calcium 7.8 (L) 8.9 - 10.3 mg/dL   Total Protein 6.3 (L) 6.5 - 8.1 g/dL   Albumin 2.5 (L) 3.5 - 5.0 g/dL   AST 61 (H) 15 - 41 U/L   ALT 34 0 - 44 U/L   Alkaline Phosphatase 167 (H) 38 - 126 U/L   Total Bilirubin 1.6 (H) 0.3 - 1.2 mg/dL   GFR calc non Af Amer 35 (L) >60 mL/min   Anion gap 8 5 - 15    Comment: Performed at [facility], [place] [address] [place], [place] [address]   Respiratory Panel by RT PCR (Flu A&B, Covid) - Nasopharyngeal Swab  Status: None   Collection Time: 03/03/20  2:18 PM   Specimen: Nasopharyngeal Swab  Result Value Ref Range   SARS Coronavirus 2 by RT PCR NEGATIVE NEGATIVE    Comment: (NOTE) SARS-CoV-2 target nucleic acids are NOT DETECTED.  The SARS-CoV-2 RNA is generally detectable in upper respiratoy specimens during the acute phase of infection. The lowest concentration of SARS-CoV-2 viral copies this assay can detect is 131 copies/mL. A negative result does not preclude SARS-Cov-2 infection and should not be used as the sole basis for treatment or other patient management decisions. A negative result may occur with  improper specimen collection/handling, submission of specimen other than nasopharyngeal swab, presence of viral mutation(s) within the areas targeted by this assay, and inadequate number of viral copies (<131 copies/mL). A negative result must be combined with clinical observations, patient history, and epidemiological information. The expected result is Negative.  Fact Sheet for Patients:  PinkCheek.be  Fact Sheet for Healthcare Providers:  GravelBags.it  This test is no t yet approved or cleared by the [place] FDA and  has been authorized for detection and/or diagnosis of SARS-CoV-2 by FDA under an Emergency Use Authorization (EUA). This EUA will remain  in effect (meaning this test can be used) for the duration of the COVID-19 declaration under Section 564(b)(1) of the Act, 21 U.S.C. section 360bbb-3(b)(1), unless the authorization is terminated or revoked sooner.     Influenza A by PCR NEGATIVE NEGATIVE   Influenza B by PCR NEGATIVE NEGATIVE    Comment: (NOTE) The Xpert Xpress SARS-CoV-2/FLU/RSV assay is intended as an aid in  the diagnosis of influenza from Nasopharyngeal swab specimens and  should not be used as a sole basis for  treatment. Nasal washings and  aspirates are unacceptable for Xpert Xpress SARS-CoV-2/FLU/RSV  testing.  Fact Sheet for Patients: PinkCheek.be  Fact Sheet for Healthcare Providers: GravelBags.it  This test is not yet approved or cleared by the [place] FDA and  has been authorized for detection and/or diagnosis of SARS-CoV-2 by  FDA under an Emergency Use Authorization (EUA). This EUA will remain  in effect (meaning this test can be used) for the duration of the  Covid-19 declaration under Section 564(b)(1) of the Act, 21  U.S.C. section 360bbb-3(b)(1), unless the authorization is  terminated or revoked. Performed at [facility], [place] [address], [place], [place] [address]   Protime-INR     Status: Abnormal   Collection Time: 03/03/20  3:04 PM  Result Value Ref Range   Prothrombin Time 15.4 (H) 11.4 - 15.2 seconds   INR 1.3 (H) 0.8 - 1.2    Comment: (NOTE) INR goal varies based on device and disease states. Performed at [facility], [place] [address], [place], [place] [address]   APTT     Status: Abnormal   Collection Time: 03/03/20  3:04 PM  Result Value Ref Range   aPTT 49 (H) 24 - 36 seconds    Comment:        IF BASELINE aPTT IS ELEVATED, SUGGEST PATIENT RISK ASSESSMENT BE USED TO DETERMINE APPROPRIATE ANTICOAGULANT THERAPY. Performed at [facility], [place] [address], [place], [place] [address]   Lactate dehydrogenase (pleural or peritoneal fluid)     Status: Abnormal   Collection Time: 03/03/20  4:38 PM  Result Value Ref Range   LD, Fluid 29 (H) 3 - 23 U/L    Comment: (NOTE) Results should be evaluated in conjunction with serum values  Fluid Type-FLDH CYTO PERI     Comment: Performed at [facility], [place] [address] [place], [place] [address]  Albumin, pleural or peritoneal fluid     Status: None   Collection Time: 03/03/20   4:38 PM  Result Value Ref Range   Albumin, Fluid <1.0 g/dL    Comment: (NOTE) No normal range established for this test Results should be evaluated in conjunction with serum values    Fluid Type-FALB CYTO PERI     Comment: Performed at [facility], [place] [address] [place], [place] [address]  Protein, pleural or peritoneal fluid     Status: None   Collection Time: 03/03/20  4:38 PM  Result Value Ref Range   Total protein, fluid <3.0 g/dL    Comment: (NOTE) No normal range established for this test Results should be evaluated in conjunction with serum values    Fluid Type-FTP CYTO PERI     Comment: Performed at [facility], [place] [address] [place], [place] [address]  Glucose, pleural or peritoneal fluid     Status: None   Collection Time: 03/03/20  4:38 PM  Result Value Ref Range   Glucose, Fluid 142 mg/dL    Comment: (NOTE) No normal range established for this test Results should be evaluated in conjunction with serum values    Fluid Type-FGLU CYTO PERI     Comment: Performed at [facility], [place] [address], [place], [place] [address]  Hemoglobin A1c     Status: None   Collection Time: 03/03/20  5:00 PM  Result Value Ref Range   Hgb A1c MFr Bld 5.6 4.8 - 5.6 %    Comment: (NOTE) Pre diabetes:          5.7%-6.4%  Diabetes:              >6.4%  Glycemic control for   <7.0% adults with diabetes    Mean Plasma Glucose 114.02 mg/dL    Comment: Performed at [place] [address] [place], [place] [address]  TSH     Status: None   Collection Time: 03/03/20  5:00 PM  Result Value Ref Range   TSH 3.191 0.350 - 4.500 uIU/mL    Comment: Performed by a 3rd Generation assay with a functional sensitivity of <=0.01 uIU/mL. Performed at [facility], [place] [address] [place], [place] [address]    DG Chest 2 View  Result Date: 03/03/2020 CLINICAL DATA:  Weakness. EXAM: CHEST - 2 VIEW  COMPARISON:  September 20, 2018. FINDINGS: Stable cardiomediastinal silhouette. Right internal jugular Port-A-Cath is unchanged in position. No pneumothorax is noted. Left lung is clear. Elevated right hemidiaphragm is noted with mild right basilar subsegmental atelectasis or infiltrate. No significant pleural effusion is noted. Bony thorax is unremarkable. IMPRESSION: Elevated right hemidiaphragm with mild right basilar subsegmental atelectasis or infiltrate. Electronically Signed   By: Marijo Conception M.D.   On: 03/03/2020 12:30   CT Head Wo Contrast  Result Date: 03/03/2020 CLINICAL DATA:  Slurred speech, generalized weakness EXAM: CT HEAD WITHOUT CONTRAST TECHNIQUE: Contiguous axial images were obtained from the base of the skull through the vertex without intravenous contrast. COMPARISON:  MRI 02/15/2011 FINDINGS: Brain: No evidence of acute infarction, hemorrhage, hydrocephalus, extra-axial collection or mass lesion/mass effect. Encephalomalacia within the posterior left temporoparietal region at site of previously seen infarct. Vascular: Atherosclerotic calcifications involving the large vessels of the skull base. No unexpected hyperdense vessel. Skull: Normal.  Negative for fracture or focal lesion. Sinuses/Orbits: No acute finding. Other: None. IMPRESSION: No acute intracranial findings. Electronically Signed   By: Davina Poke D.O.   On: 03/03/2020 14:28   CT ABDOMEN PELVIS W CONTRAST  Result Date: 03/03/2020 CLINICAL DATA:  Abdominal distension. History of metastatic pancreas cancer. EXAM: CT ABDOMEN AND PELVIS WITH CONTRAST TECHNIQUE: Multidetector CT imaging of the abdomen and pelvis was performed using the standard protocol following bolus administration of intravenous contrast. CONTRAST:  9mL OMNIPAQUE IOHEXOL 300 MG/ML  SOLN COMPARISON:  12/25/2019 FINDINGS: Lower chest: Pleuroparenchymal scarring is again noted within the right lung. No substantial change in the appearance of tiny bilateral  lung nodules within the imaged portions of the lower lung zones. No acute abnormality identified. Hepatobiliary: Previously characterized subcapsular metastatic lesion is not confidently seen on today's study. No new focal liver abnormality. Within the right hepatic lobe gallbladder unremarkable. No biliary ductal dilatation. Pancreas: No signs of pancreatic inflammation or main duct dilatation. The index lesion within distal tail of pancreas measures 2.0 x 1.6 cm, image 36/2. Previously 3.0 x 1.6 cm. Spleen: Normal in size without focal abnormality. Adrenals/Urinary Tract: Normal appearance of the adrenal glands. No kidney mass or hydronephrosis identified. Similar appearance of scarring and volume loss from the inferior pole cortex of the left kidney. Urinary bladder appears normal. Stomach/Bowel: Stomach is nondistended. No bowel wall thickening, inflammation, or distension identified. Left lower quadrant colostomy identified. 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 Vascular/Lymphatic: Aortic atherosclerosis. No aneurysm. The portal vein is patent. 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 No abdominopelvic adenopathy. Reproductive: Prostate is unremarkable. Other: Interval development of large volume of ascites within the abdomen and pelvis. No discrete peritoneal nodule. Musculoskeletal: No acute or significant osseous findings. IMPRESSION: 1. Interval development of large volume of ascites within the abdomen and pelvis. No discrete peritoneal nodule identified. Etiology is indeterminate. Differential considerations include (but not limited to): Portal venous hypertension, CHF, or peritoneal metastasis. Diagnostic and therapeutic paracentesis may be helpful for further investigation. 2. Decrease in size of distal tail of pancreas lesion. 3. Previously characterized subcapsular metastatic lesion within the liver is not confidently seen on today's study. No new focal liver  abnormality identified. 4. Similar appearance of tiny bilateral lung nodules within the imaged portions of the lower lung zones. 5. Aortic atherosclerosis. Aortic Atherosclerosis (ICD10-I70.0). Electronically Signed   By: Kerby Moors M.D.   On: 03/03/2020 14:37   US Paracentesis  Result Date: 03/03/2020 INDICATION: Patient with a history of colon cancer presents with new onset ascites. Interventional radiology asked to perform a therapeutic and diagnostic paracentesis. EXAM: ULTRASOUND GUIDED PARACENTESIS MEDICATIONS: 1% lidocaine 10 mL COMPLICATIONS: None immediate. PROCEDURE: Informed written consent was obtained from the patient after a discussion of the risks, benefits and alternatives to treatment. A timeout was performed prior to the initiation of the procedure. Initial ultrasound scanning demonstrates a large amount of ascites within the right lower abdominal quadrant. The right lower abdomen was prepped and draped in the usual sterile fashion. 1% lidocaine was used for local anesthesia. Following this, a 19 gauge, 7-cm, Yueh catheter was introduced. An ultrasound image was saved for documentation purposes. The paracentesis was performed. The catheter was removed and a dressing was applied. The patient tolerated the procedure well without immediate post procedural complication. FINDINGS: A total of approximately 5 L of clear fluid was removed. Samples were sent to the laboratory as requested by the clinical team. IMPRESSION: Successful ultrasound-guided paracentesis yielding  5 liters of peritoneal fluid. Read by: Soyla Dryer, NP Electronically Signed   By: Lucrezia Europe M.D.   On: 03/03/2020 16:31    Pending Labs Unresulted Labs (From admission, onward)          Start     Ordered   03/04/20 9675  Basic metabolic panel  Daily,   R      03/03/20 1506   03/04/20 0500  CBC  Daily,   R      03/03/20 1506   03/03/20 1648  Aerobic Culture (superficial specimen)  RELEASE UPON ORDERING,   STAT         03/03/20 1648   03/03/20 1648  Lipase, Fluid  RELEASE UPON ORDERING,   STAT        03/03/20 1648   03/03/20 1648  Body fluid cell count with differential  RELEASE UPON ORDERING,   STAT        03/03/20 1648   03/03/20 1648  Body fluid culture  RELEASE UPON ORDERING,   STAT        03/03/20 1648   03/03/20 1648  Amylase, pleural or peritoneal fluid  RELEASE UPON ORDERING,   STAT        03/03/20 1648   03/03/20 1520  HIV Antibody (routine testing w rflx)  (HIV Antibody (Routine testing w reflex) panel)  Add-on,   AD        03/03/20 1519   03/03/20 1506  Culture, sputum-assessment  Once,   R        03/03/20 1506   03/03/20 1504  Urine culture  Once,   STAT        03/03/20 1506   03/03/20 1438  Blood culture (routine x 2)  BLOOD CULTURE X 2,   STAT      03/03/20 1437   03/03/20 1204  Urinalysis, Routine w reflex microscopic  Once,   STAT        03/03/20 1205          Vitals/Pain Today's Vitals   03/03/20 1131 03/03/20 1133 03/03/20 1656 03/03/20 1735  BP: (!) 132/93  115/87   Pulse: 82  80 88  Resp: 20  17   Temp: 98.7 F (37.1 C)  97.7 F (36.5 C)   TempSrc: Oral  Oral   SpO2: 99%  97% 97%  Weight:  98.9 kg    Height:  5\' 6"  (1.676 m)      Isolation Precautions No active isolations  Medications Medications  vancomycin (VANCOREADY) IVPB 2000 mg/400 mL (2,000 mg Intravenous New Bag/Given 03/03/20 1812)  heparin injection 5,000 Units (5,000 Units Subcutaneous Given 03/03/20 1726)  sodium chloride flush (NS) 0.9 % injection 3 mL (3 mLs Intravenous Given 03/03/20 1734)  traZODone (DESYREL) tablet 50 mg (has no administration in time range)  docusate sodium (COLACE) capsule 100 mg (100 mg Oral Given 03/03/20 1732)  senna-docusate (Senokot-S) tablet 1 tablet (has no administration in time range)  sorbitol 70 % solution 30 mL (has no administration in time range)  ondansetron (ZOFRAN) tablet 4 mg (has no administration in time range)    Or  ondansetron (ZOFRAN) injection 4  mg (has no administration in time range)  albuterol (PROVENTIL) (2.5 MG/3ML) 0.083% nebulizer solution 2.5 mg (has no administration in time range)  levalbuterol (XOPENEX) nebulizer solution 0.63 mg (has no administration in time range)  acetaminophen (TYLENOL) tablet 650 mg (has no administration in time range)    Or  acetaminophen (TYLENOL) suppository 650 mg (  has no administration in time range)  oxyCODONE (Oxy IR/ROXICODONE) immediate release tablet 5 mg (has no administration in time range)  HYDROmorphone (DILAUDID) injection 0.5-1 mg (has no administration in time range)  cefTRIAXone (ROCEPHIN) 2 g in sodium chloride 0.9 % 100 mL IVPB (has no administration in time range)  aspirin chewable tablet 81 mg (has no administration in time range)  amLODipine (NORVASC) tablet 10 mg (has no administration in time range)  atenolol (TENORMIN) tablet 25 mg (has no administration in time range)  simvastatin (ZOCOR) tablet 20 mg (has no administration in time range)  clopidogrel (PLAVIX) tablet 75 mg (has no administration in time range)  gabapentin (NEURONTIN) capsule 300 mg (has no administration in time range)  lidocaine (XYLOCAINE) 1 % (with pres) injection (has no administration in time range)  albumin human 25 % solution 25 g (has no administration in time range)  furosemide (LASIX) injection 20 mg (20 mg Intravenous Given 03/03/20 1728)  iohexol (OMNIPAQUE) 300 MG/ML solution 75 mL (75 mLs Intravenous Contrast Given 03/03/20 1359)  ceFEPIme (MAXIPIME) 2 g in sodium chloride 0.9 % 100 mL IVPB (0 g Intravenous Stopped 03/03/20 1811)    Mobility walks

## 2020-03-03 NOTE — H&P (Addendum)
History and Physical   Patient: Alex [name]                            PCP: Antonietta Jewel, MD                    DOB: 06-10-1950            DOA: 03/03/2020 OYD:[id number]             DOS: 03/03/2020, 4:31 PM  Patient coming from:   HOME  I have personally reviewed patient's medical records, in electronic medical records, including:  [facility], and care everywhere.    Chief Complaint:   Chief Complaint  Patient presents with  . Aphasia  . Weakness    History of present illness:    Alex [name] is a 69 y.o. 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 ) Presented with progressive weakness, fall, progressive distention of of his abdomen.  Patient Denies having: Fever, Chills, Cough, SOB, Chest Pain, Abd pain, N/V/D, headache, dizziness, lightheadedness,  Dysuria, Joint pain, rash, open wounds   ED Course:  Upon arrival hemodynamically stable, T-max 98.7, CBC WBC 13.4 - any shift, BMP within normal [place] with exception of elevated BUN 42, creatinine 1.90, INR 1.3 Respiratory panel influenza A/B negative SARS-CoV-2 negative  CT abdomen pelvis large volume of ascites within the Decrease in size of distal tail of pancreas lesion. Previously characterized subcapsular metastatic lesion within the liver is not confidently seen on today's study. No new focal liver abnormality identified. Marland Kitchen.Similar appearance of tiny bilateral lung nodules within the imaged portions of the lower lung zones.  CT of the head - No acute intracranial findings.       Review of Systems: As per HPI, otherwise 10 point review of systems were negative.   ----------------------------------------------------------------------------------------------------------------------  No Known Allergies  Home MEDs:    Prior to Admission medications   Medication Sig Start Date End Date Taking? Authorizing Provider  acetaminophen (TYLENOL) 500 MG tablet Take 2 tablets (1,000 mg total) by mouth every 6 (six) hours as needed. Patient taking differently: Take 1,000 mg by mouth every 6 (six) hours as needed for moderate pain.  09/04/17   Barrett, Erin R, PA-C  amLODipine (NORVASC) 10 MG tablet Take 10 mg by mouth daily.    [provider]  aspirin 81 MG tablet Take 81 mg by mouth daily. 05/01/18   [provider]  atenolol (TENORMIN) 25 MG tablet Take 25 mg by mouth daily.    [provider]  clopidogrel (PLAVIX) 75 MG tablet Take 75 mg by mouth daily.    [provider]  gabapentin (NEURONTIN) 100 MG capsule Take 3 capsules (300 mg total) by mouth at bedtime. 12/29/19   Ladell Pier, MD  hydrochlorothiazide (HYDRODIURIL) 25 MG tablet Take 25 mg by mouth daily.    [provider]  lidocaine-prilocaine (EMLA) cream Apply 1 application topically as needed. Apply to portacath  1 1/2 -  2 hours prior to procedures as needed. 10/31/19   Owens Shark, NP  potassium chloride SA (KLOR-CON) 20 MEQ tablet Take 1 tablet (20 mEq total) by mouth daily. 07/11/19   Ladell Pier, MD  prochlorperazine (COMPAZINE) 10 MG tablet Take 1 tablet (10 mg total) by mouth every  6 (six) hours as needed for nausea or vomiting. Patient not taking: Reported on 02/13/2020 01/16/20   Ladell Pier, MD  simvastatin (ZOCOR) 20 MG tablet Take 20 mg by mouth at bedtime.     [provider]    PRN MEDs: acetaminophen **OR** acetaminophen, albuterol, HYDROmorphone (DILAUDID) injection, levalbuterol, ondansetron **OR** ondansetron (ZOFRAN) IV, oxyCODONE, senna-docusate, sorbitol, traZODone  Past Medical History:  Diagnosis Date  . Abdominal distension 09/14/2017  . 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 Anemia 08/26/2017  . Aortic  atherosclerosis ([place]) 08/27/2017  . Atherosclerosis of arteries   . Carotid artery occlusion   . Colon obstruction ([place])   . Colon polyps   . Community acquired pneumonia of right lower lobe of lung 08/04/2017  . Dehydration   . Empyema ([place]) 08/27/2017  . Empyema of right pleural space ([place]) 08/30/2017  . High cholesterol   . History of stroke 08/04/2017  . Hypertension   . Hypokalemia 09/14/2017  . Hyponatremia 08/26/2017  . Large bowel obstruction ([place]) 09/13/2017  . Lobar pneumonia ([place]) 08/27/2017  . Malignant tumor of sigmoid colon ([place])   . Nausea & vomiting 09/14/2017  . Nausea and vomiting 09/14/2017  . Pleural effusion 08/25/2017  . Pleural effusion, right 08/27/2017  . Pneumonia   . Pulmonary nodule, right 08/27/2017  . Stroke [facility]) 2010   denies residual on 09/18/2017    Past Surgical History:  Procedure Laterality Date  . ANKLE FRACTURE SURGERY Right 1984  . DECORTICATION Right 08/30/2017   Procedure: DECORTICATION of right lower lung lobe;  Surgeon: Prescott Gum, Collier Salina, MD;  Location: [place];  Service: Thoracic;  Laterality: Right;  . FLEXIBLE SIGMOIDOSCOPY N/A 09/18/2017   Procedure: FLEXIBLE SIGMOIDOSCOPY;  Surgeon: Jerene Bears, MD;  Location[facility] ENDOSCOPY;  Service: Gastroenterology;  Laterality: N/A;  . PARTIAL COLECTOMY N/A 09/20/2017   Procedure: OPEN PARTIAL COLECTOMY WITH COLOSTOMY;  Surgeon: Clovis Riley, MD;  Location: [place];  Service: General;  Laterality: N/A;  . PORTACATH PLACEMENT Right 09/20/2018   Procedure: INSERTION PORT-A-CATH WITH ULTRASOUND;  Surgeon: Clovis Riley, MD;  Location: [place];  Service: General;  Laterality: Right;  Marland Kitchen VIDEO ASSISTED THORACOSCOPY (VATS)/EMPYEMA Right 08/30/2017   Procedure: VIDEO ASSISTED THORACOSCOPY (VATS)/EMPYEMA   ;  Surgeon: Ivin Poot, MD;  Location: [place];  Service: Thoracic;  Laterality: Right;     reports that he has never smoked. He has never used smokeless tobacco. He reports previous alcohol use.  He reports current drug use. Drug: Marijuana.   Family History  Problem Relation Age of Onset  . Heart disease Other   . Hypertension Father   . Stroke Father   . Heart disease Father   . Diabetes Sister   . Diabetes Brother   . Head & neck cancer Brother     Physical Exam:   Vitals:   03/03/20 1131 03/03/20 1133  BP: (!) 132/93   Pulse: 82   Resp: 20   Temp: 98.7 F (37.1 C)   TempSrc: Oral   SpO2: 99%   Weight:  98.9 kg  Height:  5\' 6"  (1.676 m)   Constitutional: NAD, calm, comfortable Eyes: PERRL, lids and conjunctivae normal ENMT: Mucous membranes are moist. Posterior pharynx clear of any exudate or lesions.Normal dentition.  Neck: normal, supple, no masses, no thyromegaly Respiratory: clear to auscultation bilaterally, no wheezing, no crackles. Normal respiratory effort. No accessory muscle use.  Cardiovascular: Regular rate  and rhythm, no murmurs / rubs / gallops. No extremity edema. 2+ pedal pulses. No carotid bruits.  Abdomen: Extensive abdominal distention, with a fluid shift -but nontender -colostomy bag in place Bowel sounds positive.  Musculoskeletal: no clubbing / cyanosis. No joint deformity upper and lower extremities. Good ROM, no contractures. Normal muscle tone.  Neurologic: CN II-XII grossly intact. Sensation intact, DTR normal. Strength 5/5 in all 4.  Psychiatric: Normal judgment and insight. Alert and oriented x 3. Normal mood.  Skin: no rashes, lesions, ulcers. No induration Wounds: per nursing documentation         Labs on admission:    I have personally reviewed following labs and imaging studies  CBC: Recent Labs  Lab 03/03/20 1238  WBC 13.4*  NEUTROABS 9.7*  HGB 13.1  HCT 40.4  MCV 92.7  PLT 518   Basic Metabolic Panel: Recent Labs  Lab 03/03/20 1238  NA 131*  K 5.1  CL 100  CO2 23  GLUCOSE 158*  BUN 42*  CREATININE 1.90*  CALCIUM 7.8*   GFR: Estimated Creatinine Clearance: 40.9 mL/min (A) (by C-G formula based on  SCr of 1.9 mg/dL (H)). Liver Function Tests: Recent Labs  Lab 03/03/20 1238  AST 61*  ALT 34  ALKPHOS 167*  BILITOT 1.6*  PROT 6.3*  ALBUMIN 2.5*   No results for input(s): LIPASE, AMYLASE in the last 168 hours. No results for input(s): AMMONIA in the last 168 hours. Coagulation Profile: Recent Labs  Lab 03/03/20 1504  INR 1.3*   Cardiac Enzymes: No results for input(s): CKTOTAL, CKMB, CKMBINDEX, TROPONINI in the last 168 hours. BNP (last 3 results) No results for input(s): PROBNP in the last 8760 hours. HbA1C: No results for input(s): HGBA1C in the last 72 hours. CBG: No results for input(s): GLUCAP in the last 168 hours. Lipid Profile: No results for input(s): CHOL, HDL, LDLCALC, TRIG, CHOLHDL, LDLDIRECT in the last 72 hours. Thyroid Function Tests: No results for input(s): TSH, T4TOTAL, FREET4, T3FREE, THYROIDAB in the last 72 hours. Anemia Panel: No results for input(s): VITAMINB12, FOLATE, FERRITIN, TIBC, IRON, RETICCTPCT in the last 72 hours. Urine analysis:    Component Value Date/Time   COLORURINE YELLOW 09/14/2017 0211   APPEARANCEUR CLEAR 09/14/2017 0211   LABSPEC 1.039 (H) 09/14/2017 0211   PHURINE 5.0 09/14/2017 0211   GLUCOSEU NEGATIVE 09/14/2017 0211   HGBUR NEGATIVE 09/14/2017 0211   BILIRUBINUR NEGATIVE 09/14/2017 0211   KETONESUR 20 (A) 09/14/2017 0211   PROTEINUR 30 (A) 09/14/2017 0211   UROBILINOGEN 1.0 02/15/2011 0431   NITRITE NEGATIVE 09/14/2017 0211   LEUKOCYTESUR NEGATIVE 09/14/2017 0211     Radiologic Exams on Admission:   DG Chest 2 View  Result Date: 03/03/2020 CLINICAL DATA:  Weakness. EXAM: CHEST - 2 VIEW COMPARISON:  September 20, 2018. FINDINGS: Stable cardiomediastinal silhouette. Right internal jugular Port-A-Cath is unchanged in position. No pneumothorax is noted. Left lung is clear. Elevated right hemidiaphragm is noted with mild right basilar subsegmental atelectasis or infiltrate. No significant pleural effusion is noted. Bony  thorax is unremarkable. IMPRESSION: Elevated right hemidiaphragm with mild right basilar subsegmental atelectasis or infiltrate. Electronically Signed   By: Marijo Conception M.D.   On: 03/03/2020 12:30   CT Head Wo Contrast  Result Date: 03/03/2020 CLINICAL DATA:  Slurred speech, generalized weakness EXAM: CT HEAD WITHOUT CONTRAST TECHNIQUE: Contiguous axial images were obtained from the base of the skull through the vertex without intravenous contrast. COMPARISON:  MRI 02/15/2011 FINDINGS: Brain: No evidence of acute  infarction, hemorrhage, hydrocephalus, extra-axial collection or mass lesion/mass effect. Encephalomalacia within the posterior left temporoparietal region at site of previously seen infarct. Vascular: Atherosclerotic calcifications involving the large vessels of the skull base. No unexpected hyperdense vessel. Skull: Normal. Negative for fracture or focal lesion. Sinuses/Orbits: No acute finding. Other: None. IMPRESSION: No acute intracranial findings. Electronically Signed   By: Davina Poke D.O.   On: 03/03/2020 14:28   CT ABDOMEN PELVIS W CONTRAST  Result Date: 03/03/2020 CLINICAL DATA:  Abdominal distension. History of metastatic pancreas cancer. EXAM: CT ABDOMEN AND PELVIS WITH CONTRAST TECHNIQUE: Multidetector CT imaging of the abdomen and pelvis was performed using the standard protocol following bolus administration of intravenous contrast. CONTRAST:  63mL OMNIPAQUE IOHEXOL 300 MG/ML  SOLN COMPARISON:  12/25/2019 FINDINGS: Lower chest: Pleuroparenchymal scarring is again noted within the right lung. No substantial change in the appearance of tiny bilateral lung nodules within the imaged portions of the lower lung zones. No acute abnormality identified. Hepatobiliary: Previously characterized subcapsular metastatic lesion is not confidently seen on today's study. No new focal liver abnormality. Within the right hepatic lobe gallbladder unremarkable. No biliary ductal dilatation.  Pancreas: No signs of pancreatic inflammation or main duct dilatation. The index lesion within distal tail of pancreas measures 2.0 x 1.6 cm, image 36/2. Previously 3.0 x 1.6 cm. Spleen: Normal in size without focal abnormality. Adrenals/Urinary Tract: Normal appearance of the adrenal glands. No kidney mass or hydronephrosis identified. Similar appearance of scarring and volume loss from the inferior pole cortex of the left kidney. Urinary bladder appears normal. Stomach/Bowel: Stomach is nondistended. No bowel wall thickening, inflammation, or distension identified. Left lower quadrant colostomy identified. 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 Vascular/Lymphatic: Aortic atherosclerosis. No aneurysm. The portal vein is patent. 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 No abdominopelvic adenopathy. Reproductive: Prostate is unremarkable. Other: Interval development of large volume of ascites within the abdomen and pelvis. No discrete peritoneal nodule. Musculoskeletal: No acute or significant osseous findings. IMPRESSION: 1. Interval development of large volume of ascites within the abdomen and pelvis. No discrete peritoneal nodule identified. Etiology is indeterminate. Differential considerations include (but not limited to): Portal venous hypertension, CHF, or peritoneal metastasis. Diagnostic and therapeutic paracentesis may be helpful for further investigation. 2. Decrease in size of distal tail of pancreas lesion. 3. Previously characterized subcapsular metastatic lesion within the liver is not confidently seen on today's study. No new focal liver abnormality identified. 4. Similar appearance of tiny bilateral lung nodules within the imaged portions of the lower lung zones. 5. Aortic atherosclerosis. Aortic Atherosclerosis (ICD10-I70.0). Electronically Signed   By: Kerby Moors M.D.   On: 03/03/2020 14:37    EKG:   Independently reviewed.  Orders placed or  performed during the hospital encounter of 03/03/20  . ED EKG  . ED EKG  . EKG 12-Lead  . EKG 12-Lead  . EKG 12-Lead   ---------------------------------------------------------------------------------------------------------------------------------------    Assessment / Plan:   Principal Problem:   Ascites Active Problems:   History of stroke   Anemia   Benign essential HTN   Abdominal distension   Nausea & vomiting   Nausea and vomiting   Cancer of pancreas, tail (HCC)      Ascites -Large new ascites with history of metastatic pancreatic cancer -CT abdomen pelvis reviewed -Ultrasound-guided paracentesis was ordered already completed yielding 5 liters of fluid Will replace albumin Cytology labs ordered >> to be followed -Patient's oncologist consulted Dr. Benay Spice (Dr. Irene Limbo Dr. Call) -Empiric  antibiotics initiated with cefepime, switch to Rocephin   Cancer of pancreas, tail (HCC)-with mets -Under treatment with Dr. Benay Spice -CT abdomen pelvis reviewed  Mild anasarca -Patient will need diuretics -Once albumin is repleted diuretics will be initiated   Progressive generalized weaknesses,-falls Consulting PT OT for evaluation  Acute renal insufficiency -Creatinine elevated from baseline -Need to be monitored -patient needs diuretics if creatinine continue to get worse We will consider consulting nephrology  Possible [facility] pneumonia -Imaging superimposed by extensive ascites -Cultures been obtained -Antibiotics as above  Active Problems:   History of stroke -we will continue home medication of aspirin, Plavix, statins   Anemia -anemia of chronic disease -monitoring H&H closely   Benign essential HTN -currently stable continue home medication including Norvasc, with holding HCTZ due to elevated creatinine   Abdominal distension -due to ascites, will monitor closely   Nausea & vomiting -as needed antiemetics   Cultures:  03/03/2020 blood cultures x2  >> Urine culture  Sputum cultures 03/03/2020 Ascites/body fluid cultures, Gram stain  Influenza A/B negative SARS-CoV-2 negative  Antimicrobial: -03/03/2020 cefepime 2 g x 1 dose in ED 03/03/2020 Rocephin 1 g every 24 hours  Consults called:  None .  We will attempt to reach his oncologist Dr. Benay Spice -------------------------------------------------------------------------------------------------------------------------------------------- DVT prophylaxis: SCD/Compression stockings Code Status:   Code Status: Full Code   Admission status: Patient will be admitted as Inpatient, with a greater than 2 midnight length of stay.   Family Communication: Patient sister present at bedside -plan of care discussed in detail With patient and his sister -expressed understanding and agreement to current plan   ---------------------------------------------------------------------------------------------------------------------------------------------   Disposition Plan: >3 days Status is: Inpatient  Remains inpatient appropriate because:Inpatient level of care appropriate due to severity of illness   Dispo: The patient is from: Home              Anticipated d/c is to: Home              Anticipated d/c date is: 3 days              Patient currently is not medically stable to d/c.   ---------------------------------------------------------------------------------------------------------------------------------------------  Time spent: > than  88  Min.   SIGNED: Deatra James, MD, FACP, FHM. [facility],  Pager (Please use [email or web address] to page to text)  If 7PM-7AM, please contact night-coverage [email or web address] 03/03/2020, 4:31 PM

## 2020-03-04 ENCOUNTER — Ambulatory Visit (HOSPITAL_COMMUNITY): Payer: Medicare Other

## 2020-03-04 DIAGNOSIS — C252 Malignant neoplasm of tail of pancreas: Secondary | ICD-10-CM | POA: Diagnosis not present

## 2020-03-04 DIAGNOSIS — Z7189 Other specified counseling: Secondary | ICD-10-CM | POA: Diagnosis not present

## 2020-03-04 DIAGNOSIS — R180 Malignant ascites: Secondary | ICD-10-CM | POA: Diagnosis not present

## 2020-03-04 DIAGNOSIS — Z515 Encounter for palliative care: Secondary | ICD-10-CM

## 2020-03-04 LAB — BASIC METABOLIC PANEL
Anion gap: 8 (ref 5–15)
BUN: 44 mg/dL — ABNORMAL HIGH (ref 8–23)
CO2: 22 mmol/L (ref 22–32)
Calcium: 7.6 mg/dL — ABNORMAL LOW (ref 8.9–10.3)
Chloride: 100 mmol/L (ref 98–111)
Creatinine, Ser: 1.82 mg/dL — ABNORMAL HIGH (ref 0.61–1.24)
GFR calc non Af Amer: 37 mL/min — ABNORMAL LOW (ref >60)
Glucose, Bld: 107 mg/dL — ABNORMAL HIGH (ref 70–99)
Potassium: 4.8 mmol/L (ref 3.5–5.1)
Sodium: 130 mmol/L — ABNORMAL LOW (ref 135–145)

## 2020-03-04 LAB — BLOOD CULTURE ID PANEL (REFLEXED) - BCID2

## 2020-03-04 LAB — CBC
HCT: 32.9 % — ABNORMAL LOW (ref 39.0–52.0)
Hemoglobin: 10.7 g/dL — ABNORMAL LOW (ref 13.0–17.0)
MCH: 30.2 pg (ref 26.0–34.0)
MCHC: 32.5 g/dL (ref 30.0–36.0)
MCV: 92.9 fL (ref 80.0–100.0)
Platelets: 151 10*3/uL (ref 150–400)
RBC: 3.54 MIL/uL — ABNORMAL LOW (ref 4.22–5.81)
RDW: 19.9 % — ABNORMAL HIGH (ref 11.5–15.5)
WBC: 9.1 10*3/uL (ref 4.0–10.5)
nRBC: 0 % (ref 0.0–0.2)

## 2020-03-04 LAB — GLUCOSE, CAPILLARY: Glucose-Capillary: 99 mg/dL (ref 70–99)

## 2020-03-04 MED ORDER — CHLORHEXIDINE GLUCONATE CLOTH 2 % EX PADS
6.0000 | MEDICATED_PAD | Freq: Every day | CUTANEOUS | Status: DC
  Administered 2020-03-04 – 2020-03-09 (×6): 6 via TOPICAL

## 2020-03-04 MED ORDER — SODIUM CHLORIDE 0.9% FLUSH: 10.0000 mL | INTRAVENOUS | Status: DC | PRN

## 2020-03-04 MED ORDER — VANCOMYCIN HCL 1250 MG/250ML IV SOLN
1250.0000 mg | INTRAVENOUS | Status: DC
  Administered 2020-03-04: 1250 mg via INTRAVENOUS
  Filled 2020-03-04: qty 250

## 2020-03-04 MED ORDER — SODIUM CHLORIDE 0.9% FLUSH
10.0000 mL | Freq: Two times a day (BID) | INTRAVENOUS | Status: DC
  Administered 2020-03-04 – 2020-03-08 (×5): 10 mL

## 2020-03-04 MED ORDER — SODIUM CHLORIDE 0.9 % IV SOLN
INTRAVENOUS | Status: DC | PRN
  Administered 2020-03-04: 250 mL via INTRAVENOUS

## 2020-03-04 NOTE — Progress Notes (Signed)
PHARMACY - PHYSICIAN COMMUNICATION CRITICAL VALUE ALERT - BLOOD CULTURE IDENTIFICATION (BCID)  Alex [name] is an 69 y.o. male with pancreatic cancer who presented to [facility] on 03/03/2020 with a chief complaint of weakness, s/p paracentesis for ascites.  Assessment:  Both sets of blood cultures from portacath growing staph epidermidis  Name of physician (or Provider) Contacted: Dr. Lupita Leash  Current antibiotics: ceftriaxone  Changes to prescribed antibiotics recommended:  Recommendations accepted by provider - adding vancomycin pending final cultures and clinical improvement  Results for orders placed or performed during the hospital encounter of 03/03/20  Blood Culture ID Panel (Reflexed) (Collected: 03/03/2020  4:49 PM)  Result Value Ref Range   Enterococcus faecalis NOT DETECTED NOT DETECTED   Enterococcus Faecium NOT DETECTED NOT DETECTED   Listeria monocytogenes NOT DETECTED NOT DETECTED   Staphylococcus species DETECTED (A) NOT DETECTED   Staphylococcus aureus (BCID) NOT DETECTED NOT DETECTED   Staphylococcus epidermidis DETECTED (A) NOT DETECTED   Staphylococcus lugdunensis NOT DETECTED NOT DETECTED   Streptococcus species NOT DETECTED NOT DETECTED   Streptococcus agalactiae NOT DETECTED NOT DETECTED   Streptococcus pneumoniae NOT DETECTED NOT DETECTED   Streptococcus pyogenes NOT DETECTED NOT DETECTED   A.calcoaceticus-baumannii NOT DETECTED NOT DETECTED   Bacteroides fragilis NOT DETECTED NOT DETECTED   Enterobacterales NOT DETECTED NOT DETECTED   Enterobacter cloacae complex NOT DETECTED NOT DETECTED   Escherichia coli NOT DETECTED NOT DETECTED   Klebsiella aerogenes NOT DETECTED NOT DETECTED   Klebsiella oxytoca NOT DETECTED NOT DETECTED   Klebsiella pneumoniae NOT DETECTED NOT DETECTED   Proteus species NOT DETECTED NOT DETECTED   Salmonella species NOT DETECTED NOT DETECTED   Serratia marcescens NOT DETECTED NOT DETECTED   Haemophilus influenzae NOT DETECTED NOT  DETECTED   Neisseria meningitidis NOT DETECTED NOT DETECTED   Pseudomonas aeruginosa NOT DETECTED NOT DETECTED   Stenotrophomonas maltophilia NOT DETECTED NOT DETECTED   Candida albicans NOT DETECTED NOT DETECTED   Candida auris NOT DETECTED NOT DETECTED   Candida glabrata NOT DETECTED NOT DETECTED   Candida krusei NOT DETECTED NOT DETECTED   Candida parapsilosis NOT DETECTED NOT DETECTED   Candida tropicalis NOT DETECTED NOT DETECTED   Cryptococcus neoformans/gattii NOT DETECTED NOT DETECTED   Methicillin resistance mecA/C DETECTED (A) NOT DETECTED    Peggyann Juba, PharmD, BCPS Pharmacy: [phone] 03/04/2020  11:55 AM

## 2020-03-04 NOTE — Evaluation (Signed)
Occupational Therapy Evaluation Patient Details Name: Alex [name] MRN: [id number] DOB: 04-Mar-1951 Today's Date: 03/04/2020    History of Present Illness Pt admitted with weakness and abdominal distention 2* Ascites in the setting of Pancreatic CA with liver mets.  Pt with hx of partial colectomy, colostomy and CVA (2010)  - pt states largely affected speech    Clinical Impression   Alex [name] is a 69 year old man with the above medical history who presents today near his baseline. On evaluation patient demonstrates good upper body strength, ability to donn lower body clothing and demonstrates the physical abilities to perform ADLs. Patient required use of RW for ambulation due to initial imbalance with standing. Patient reported he's unsteady at home. Patient reports living with his son and provided with near 24/7 assistance. Patient appears to be near his baseline without reports of deficits. No OT needs at this time.    Follow Up Recommendations  No OT follow up    Equipment Recommendations  None recommended by OT    Recommendations for Other Services       Precautions / Restrictions Precautions Precautions: Fall Restrictions Weight Bearing Restrictions: No      Mobility Bed Mobility Overal bed mobility: Needs Assistance Bed Mobility: Supine to Sit     Supine to sit: Min assist     General bed mobility comments: min assist to bring trunk to upright  Transfers Overall transfer level: Needs assistance Equipment used: None Transfers: Sit to/from Stand Sit to Stand: Min assist         General transfer comment: min assist for balance with transition    Balance Overall balance assessment: Independent Sitting-balance support: No upper extremity supported;Feet supported Sitting balance-Leahy Scale: Good     Standing balance support: Bilateral upper extremity supported Standing balance-Leahy Scale: Poor                             ADL  either performed or assessed with clinical judgement   ADL Overall ADL's : Needs assistance/impaired Eating/Feeding: Independent   Grooming: Independent   Upper Body Bathing: Supervision/ safety   Lower Body Bathing: Supervison/ safety   Upper Body Dressing : Supervision/safety   Lower Body Dressing: Supervision/safety Lower Body Dressing Details (indicate cue type and reason): Able to donn socks at side of bed Toilet Transfer: Supervision/safety;BSC;Ambulation;RW;Regular Toilet   Toileting- Clothing Manipulation and Hygiene: Supervision/safety;Sit to/from stand               Vision Baseline Vision/History: No visual deficits       Perception     Praxis      Pertinent Vitals/Pain Pain Assessment: No/denies pain     Hand Dominance     Extremity/Trunk Assessment Upper Extremity Assessment Upper Extremity Assessment: Overall WFL for tasks assessed   Lower Extremity Assessment Lower Extremity Assessment: Defer to PT evaluation   Cervical / Trunk Assessment Cervical / Trunk Assessment: Normal   Communication Communication Communication: Expressive difficulties   Cognition Arousal/Alertness: Awake/alert Behavior During Therapy: Flat affect Overall Cognitive Status: Within Functional Limits for tasks assessed                                     General Comments       Exercises     Shoulder Instructions      Home Living Family/patient expects to be discharged  to:: Private residence Living Arrangements: Children Available Help at Discharge: Family Type of Home: House Home Access: Stairs to enter Technical brewer of Steps: 2   [place]: One level     Bathroom Shower/Tub: Teacher, early years/pre: [place]: None          Prior Functioning/Environment Level of Independence: Independent        Comments: Pt states he steadies on furntiture at home and relies on son if he leaves the house         OT Problem List:        OT Treatment/Interventions:      OT Goals(Current goals can be found in the care plan section) Acute Rehab OT Goals Patient Stated Goal: Regain IND  OT Goal Formulation: All assessment and education complete, DC therapy  OT Frequency:     Barriers to D/C:            Co-evaluation   Reason for Co-Treatment: For patient/therapist safety;To address functional/ADL transfers PT goals addressed during session: Mobility/safety with mobility OT goals addressed during session: ADL's and self-care      AM-PAC OT "6 Clicks" Daily Activity     Outcome Measure Help from another person eating meals?: None Help from another person taking care of personal grooming?: None Help from another person toileting, which includes using toliet, bedpan, or urinal?: A Little Help from another person bathing (including washing, rinsing, drying)?: A Little Help from another person to put on and taking off regular upper body clothing?: A Little Help from another person to put on and taking off regular lower body clothing?: A Little 6 Click Score: 20   End of Session Equipment Utilized During Treatment: Gait belt;Rolling walker Nurse Communication: Mobility status  Activity Tolerance: Patient tolerated treatment well Patient left: in chair;with call bell/phone within reach;with chair alarm set  OT Visit Diagnosis: Muscle weakness (generalized) (M62.81)                Time: 6160-7371 OT Time Calculation (min): 18 min Charges:  OT General Charges $OT Visit: 1 Visit OT Evaluation $OT Eval Low Complexity: 1 Low  Kees Idrovo, OTR/L [place]  Office [phone] Pager: [phone]   Lenward Chancellor 03/04/2020, 1:29 PM

## 2020-03-04 NOTE — Evaluation (Signed)
Physical Therapy Evaluation Patient Details Name: Alex [name] MRN: [id number] DOB: 06-23-1950 Today's Date: 03/04/2020   History of Present Illness  Pt admitted with weakness and abdominal distention 2* Ascites in the setting of Pancreatic CA with liver mets.  Pt with hx of partial colectomy, colostomy and CVA (2010)  - pt states largely affected speech   Clinical Impression  Pt admitted as above and presenting with functional mobility limitations 2* generalized weakness and balance deficits.  Pt should progress to dc home with assist of family and could benefit on HHPT follow up - dependent on acute stay progress.    Follow Up Recommendations Home health PT    Equipment Recommendations  Rolling walker with 5" wheels    Recommendations for Other Services       Precautions / Restrictions Precautions Precautions: Fall Restrictions Weight Bearing Restrictions: No      Mobility  Bed Mobility Overal bed mobility: Needs Assistance Bed Mobility: Supine to Sit     Supine to sit: Min assist     General bed mobility comments: min assist to bring trunk to upright  Transfers Overall transfer level: Needs assistance Equipment used: None Transfers: Sit to/from Stand Sit to Stand: Min assist         General transfer comment: min assist for balance with transition  Ambulation/Gait Ambulation/Gait assistance: Min assist;Mod assist;+2 physical assistance;+2 safety/equipment Gait Distance (Feet): 220 Feet Assistive device: Rolling walker (2 wheeled);1 person hand held assist Gait Pattern/deviations: Step-to pattern;Step-through pattern;Decreased step length - right;Decreased step length - left;Shuffle;Trunk flexed;Wide base of support     General Gait Details: Ambulation attempted sans AD but pt very unsteady and at high risk of falling.  Marked improvment in stability with use of RW.  Cues for posture and position from RW  Stairs            Wheelchair Mobility     Modified Rankin (Stroke Patients Only)       Balance Overall balance assessment: Needs assistance Sitting-balance support: No upper extremity supported;Feet supported Sitting balance-Leahy Scale: Good     Standing balance support: Bilateral upper extremity supported Standing balance-Leahy Scale: Poor                               Pertinent Vitals/Pain Pain Assessment: No/denies pain    Home Living Family/patient expects to be discharged to:: Private residence Living Arrangements: Children Available Help at Discharge: Family Type of Home: House Home Access: Stairs to enter   Technical brewer of Steps: 2 Home Layout: One level Home Equipment: None      Prior Function Level of Independence: Independent         Comments: Pt states he steadies on furntiture at home and relies on son if he leaves the house     Hand Dominance        Extremity/Trunk Assessment   Upper Extremity Assessment Upper Extremity Assessment: Defer to OT evaluation    Lower Extremity Assessment Lower Extremity Assessment: Generalized weakness       Communication   Communication: Expressive difficulties  Cognition Arousal/Alertness: Awake/alert Behavior During Therapy: WFL for tasks assessed/performed Overall Cognitive Status: Within Functional Limits for tasks assessed                                        General Comments  Exercises     Assessment/Plan    PT Assessment Patient needs continued PT services  PT Problem List Decreased strength;Decreased activity tolerance;Decreased balance;Decreased mobility;Decreased knowledge of use of DME;Obesity       PT Treatment Interventions DME instruction;Gait training;Stair training;Functional mobility training;Therapeutic activities;Therapeutic exercise;Patient/family education;Balance training    PT Goals (Current goals can be found in the Care Plan section)  Acute Rehab PT Goals Patient  Stated Goal: Regain IND  PT Goal Formulation: With patient Time For Goal Achievement: 03/18/20 Potential to Achieve Goals: Good    Frequency Min 3X/week   Barriers to discharge        Co-evaluation PT/OT/SLP Co-Evaluation/Treatment: Yes Reason for Co-Treatment: For patient/therapist safety PT goals addressed during session: Mobility/safety with mobility OT goals addressed during session: ADL's and self-care       AM-PAC PT "6 Clicks" Mobility  Outcome Measure Help needed turning from your back to your side while in a flat bed without using bedrails?: None Help needed moving from lying on your back to sitting on the side of a flat bed without using bedrails?: A Little Help needed moving to and from a bed to a chair (including a wheelchair)?: A Little Help needed standing up from a chair using your arms (e.g., wheelchair or bedside chair)?: A Little Help needed to walk in hospital room?: A Lot Help needed climbing 3-5 steps with a railing? : A Lot 6 Click Score: 17    End of Session Equipment Utilized During Treatment: Gait belt Activity Tolerance: Patient tolerated treatment well Patient left: in chair;with call bell/phone within reach;with chair alarm set Nurse Communication: Mobility status PT Visit Diagnosis: Unsteadiness on feet (R26.81);Muscle weakness (generalized) (M62.81);Difficulty in walking, not elsewhere classified (R26.2)    Time: 1203-1227 PT Time Calculation (min) (ACUTE ONLY): 24 min   Charges:   PT Evaluation $PT Eval Low Complexity: 1 Low          [place] Pager [phone] Office [phone]   Hazely Sealey 03/04/2020, 12:42 PM

## 2020-03-04 NOTE — Progress Notes (Addendum)
IP PROGRESS NOTE  Subjective: [name] is a 69 year old man with metastatic pancreas cancer well-known to the [facility].  He is under active treatment with gemcitabine/Abraxane.  Most recently he received Abraxane on 02/13/2020, Abraxane held secondary to neuropathy.  He presented to the emergency department yesterday with progressive weakness and abdominal distention.  His son is at bedside.  He reports [name] has been weak recently, unsteady on his feet.  [name] reports he is feeling better since the paracentesis yesterday.  He specifically notes that his breathing is better.  He reports being up with a walker.  Objective: Vital signs in last 24 hours: Temp:  [97.6 F (36.4 C)-98.5 F (36.9 C)] 97.7 F (36.5 C) (10/07 1420) Pulse Rate:  [63-88] 63 (10/07 1420) Resp:  [17-19] 18 (10/07 1420) BP: (101-124)/(60-87) 101/60 (10/07 1420) SpO2:  [97 %-99 %] 99 % (10/07 1420)  Intake/Output from previous day: 10/06 0701 - 10/07 0700 In: 183 [P.O.:180; I.V.:3] Out: 50 [Urine:50] Intake/Output this shift: Total I/O In: 240 [P.O.:240] Out: 550 [Urine:550]  HEENT: No thrush. Resp: Lungs clear bilaterally. Cardio: Regular rate and rhythm. GI: Abdomen is markedly distended consistent with ascites.  Fluid leaking from paracentesis site at the right abdomen. Vascular: Pitting edema lower legs bilaterally. Neuro: Alert and oriented.  Follows commands.  Motor strength 5/5.  Finger-to-nose intact. Port-A-Cath without erythema.  Lab Results:  Recent Labs    03/03/20 1238 03/04/20 0330  WBC 13.4* 9.1  HGB 13.1 10.7*  HCT 40.4 32.9*  PLT 255 151   BMET Recent Labs    03/03/20 1238 03/04/20 0330  NA 131* 130*  K 5.1 4.8  CL 100 100  CO2 23 22  GLUCOSE 158* 107*  BUN 42* 44*  CREATININE 1.90* 1.82*  CALCIUM 7.8* 7.6*    Studies/Results: DG Chest 2 View  Result Date: 03/03/2020 CLINICAL DATA:  Weakness. EXAM: CHEST - 2 VIEW COMPARISON:  September 20, 2018.  FINDINGS: Stable cardiomediastinal silhouette. Right internal jugular Port-A-Cath is unchanged in position. No pneumothorax is noted. Left lung is clear. Elevated right hemidiaphragm is noted with mild right basilar subsegmental atelectasis or infiltrate. No significant pleural effusion is noted. Bony thorax is unremarkable. IMPRESSION: Elevated right hemidiaphragm with mild right basilar subsegmental atelectasis or infiltrate. Electronically Signed   By: Marijo Conception M.D.   On: 03/03/2020 12:30   CT Head Wo Contrast  Result Date: 03/03/2020 CLINICAL DATA:  Slurred speech, generalized weakness EXAM: CT HEAD WITHOUT CONTRAST TECHNIQUE: Contiguous axial images were obtained from the base of the skull through the vertex without intravenous contrast. COMPARISON:  MRI 02/15/2011 FINDINGS: Brain: No evidence of acute infarction, hemorrhage, hydrocephalus, extra-axial collection or mass lesion/mass effect. Encephalomalacia within the posterior left temporoparietal region at site of previously seen infarct. Vascular: Atherosclerotic calcifications involving the large vessels of the skull base. No unexpected hyperdense vessel. Skull: Normal. Negative for fracture or focal lesion. Sinuses/Orbits: No acute finding. Other: None. IMPRESSION: No acute intracranial findings. Electronically Signed   By: Davina Poke D.O.   On: 03/03/2020 14:28   CT ABDOMEN PELVIS W CONTRAST  Result Date: 03/03/2020 CLINICAL DATA:  Abdominal distension. History of metastatic pancreas cancer. EXAM: CT ABDOMEN AND PELVIS WITH CONTRAST TECHNIQUE: Multidetector CT imaging of the abdomen and pelvis was performed using the standard protocol following bolus administration of intravenous contrast. CONTRAST:  74m OMNIPAQUE IOHEXOL 300 MG/ML  SOLN COMPARISON:  12/25/2019 FINDINGS: Lower chest: Pleuroparenchymal scarring is again noted within the right lung. No substantial  change in the appearance of tiny bilateral lung nodules within the  imaged portions of the lower lung zones. No acute abnormality identified. Hepatobiliary: Previously characterized subcapsular metastatic lesion is not confidently seen on today's study. No new focal liver abnormality. Within the right hepatic lobe gallbladder unremarkable. No biliary ductal dilatation. Pancreas: No signs of pancreatic inflammation or main duct dilatation. The index lesion within distal tail of pancreas measures 2.0 x 1.6 cm, image 36/2. Previously 3.0 x 1.6 cm. Spleen: Normal in size without focal abnormality. Adrenals/Urinary Tract: Normal appearance of the adrenal glands. No kidney mass or hydronephrosis identified. Similar appearance of scarring and volume loss from the inferior pole cortex of the left kidney. Urinary bladder appears normal. Stomach/Bowel: Stomach is nondistended. No bowel wall thickening, inflammation, or distension identified. Left lower quadrant colostomy identified. 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 Vascular/Lymphatic: Aortic atherosclerosis. No aneurysm. The portal vein is patent. 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 No abdominopelvic adenopathy. Reproductive: Prostate is unremarkable. Other: Interval development of large volume of ascites within the abdomen and pelvis. No discrete peritoneal nodule. Musculoskeletal: No acute or significant osseous findings. IMPRESSION: 1. Interval development of large volume of ascites within the abdomen and pelvis. No discrete peritoneal nodule identified. Etiology is indeterminate. Differential considerations include (but not limited to): Portal venous hypertension, CHF, or peritoneal metastasis. Diagnostic and therapeutic paracentesis may be helpful for further investigation. 2. Decrease in size of distal tail of pancreas lesion. 3. Previously characterized subcapsular metastatic lesion within the liver is not confidently seen on today's study. No new focal liver abnormality identified. 4.  Similar appearance of tiny bilateral lung nodules within the imaged portions of the lower lung zones. 5. Aortic atherosclerosis. Aortic Atherosclerosis (ICD10-I70.0). Electronically Signed   By: Kerby Moors M.D.   On: 03/03/2020 14:37   US Paracentesis  Result Date: 03/03/2020 INDICATION: Patient with a history of colon cancer presents with new onset ascites. Interventional radiology asked to perform a therapeutic and diagnostic paracentesis. EXAM: ULTRASOUND GUIDED PARACENTESIS MEDICATIONS: 1% lidocaine 10 mL COMPLICATIONS: None immediate. PROCEDURE: Informed written consent was obtained from the patient after a discussion of the risks, benefits and alternatives to treatment. A timeout was performed prior to the initiation of the procedure. Initial ultrasound scanning demonstrates a large amount of ascites within the right lower abdominal quadrant. The right lower abdomen was prepped and draped in the usual sterile fashion. 1% lidocaine was used for local anesthesia. Following this, a 19 gauge, 7-cm, Yueh catheter was introduced. An ultrasound image was saved for documentation purposes. The paracentesis was performed. The catheter was removed and a dressing was applied. The patient tolerated the procedure well without immediate post procedural complication. FINDINGS: A total of approximately 5 L of clear fluid was removed. Samples were sent to the laboratory as requested by the clinical team. IMPRESSION: Successful ultrasound-guided paracentesis yielding 5 liters of peritoneal fluid. Read by: Soyla Dryer, NP Electronically Signed   By: Lucrezia Europe M.D.   On: 03/03/2020 16:31    Medications: reviewed  Assessment/Plan: 1. 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 Well-differentiated adenocarcinoma, 0/13 lymph nodes positive, MSI-stable, no loss of mismatch repair protein expression ? Tumor involving the rectosigmoid  junction ? Sigmoidoscopy 09/18/2017-distal sigmoid colon tumor beginning between 15 and 20 cm from the dentate line, completely obstructing ? CT abdomen/pelvis 09/14/2017-sigmoid thickening no evidence of metastatic disease ? 2 mm right upper lobe nodule on CT  08/04/2017 ? Colonoscopy 02/07/2018-polyps removed from the cecum, ascending colon, transverse colon, descending colon, rectosigmoid colon and rectum (tubular adenomas)  2. Right lung pneumonia/empyema March 2019  Right VATS, drainage of empyema, and decortication of the right lower lobe 08/31/2017  3.CVA in 2010  4.Rectal and active sigmoid polyps noted on flexible sigmoidoscopy 09/18/2017  5.Pancreas tail mass/right liver lesions and 4 mm splenic lesion on CT abdomen/pelvis 05/27/2018  MRI abdomen 07/27/2018-right liver lesions, rim-enhancing lesion in the pancreas tail  Ultrasound-guided biopsy of a right liver lesion 08/14/2018-metastatic adenocarcinoma, cytokeratin 7+, CDX-2 weak positive, cytokeratin 20 negative, consistent with metastatic pancreas cancer; MSI stable; tumor mutational burden 0  CT abdomen/pelvis 09/26/2018-increased size of previously noted liver lesions, new right liver lesion, 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  Cycle 6 gemcitabine/Abraxane 12/20/2018  Cycle 7 gemcitabine/Abraxane 01/03/2019  Cycle 8 gemcitabine/Abraxane 01/17/2019  Cycle 9 gemcitabine/Abraxane 01/31/2019  Cycle 10 gemcitabine/Abraxane 02/13/2019  Cycle 11 gemcitabine/Abraxane 02/28/2019 (Abraxane held secondary to neuropathy symptoms)  Cycle 12  gemcitabine/Abraxane 03/14/2019 (Abraxane held due to persistent neuropathy symptoms)  Cycle 13 gemcitabine/Abraxane 03/28/2019 (Abraxane held due to persistent neuropathy symptoms)  Cycle 14 gemcitabine/Abraxane 04/11/2019 (Abraxane held due to persistent neuropathy symptoms)  CT abdomen/pelvis 04/28/2019-decrease in size of pancreatic mass. Decrease in size of hepatic metastatic foci.   Cycle 15 gemcitabine12/05/2018(Abraxane held due to neuropathy symptoms)  Cycle 16 gemcitabine 05/16/2019 (Abraxane held due to neuropathy symptoms)  Cycle 17 gemcitabine 05/31/2019 (Abraxane on hold due to neuropathy symptoms)  Cycle 18 gemcitabine 06/13/2019 (Abraxane held)  Cycle 19 gemcitabine 06/27/2019 (Abraxane held)  Cycle 20 gemcitabine 07/11/2019 (Abraxane held)  Cycle 21 gemcitabine 07/25/2019 (Abraxane held)  CTabdomen/pelvis 08/05/2019-stable small liver lesions, stable pancreas mass, no evidence of disease progression  Cycle 22 gemcitabine 08/08/2019 (Abraxane held)  Cycle 23 gemcitabine 08/22/2019 (Abraxane held)  Cycle 24 gemcitabine 09/05/2019 (Abraxane held)  Cycle 25 gemcitabine 09/19/2019 (Abraxane held)  CT abdomen/pelvis 09/30/2019-no change in pancreas mass, slight enlargement of right liver metastases, largest measuring 1 cm, no new lesions  Cycle 26 gemcitabine/Abraxane 10/03/2019-Abraxane resumed at a dose of 100 mg/m  Cycle 27 gemcitabine/Abraxane 10/17/2019  Cycle 28 gemcitabine/Abraxane 10/31/2019  Cycle 29 gemcitabine/Abraxane 11/14/2019  Cycle 30 gemcitabine/Abraxane 11/28/2019  Cycle 31 gemcitabine/Abraxane 12/12/2019  CT abdomen/pelvis 12/25/2019-stable right hepatic lesion, no new liver lesions, stable pancreas tail mass, tiny bilateral lung base nodules, new compared to a chest CT 09/25/2017 and some have progressed compared to a's CT abdomen 08/05/2019  Cycle 32 gemcitabine/Abraxane 01/02/2020 (chemotherapy dose reduced due to neutropenia)  Cycle 33 gemcitabine/Abraxane  01/23/2020  Cycle 34 gemcitabine 02/13/2020 (Abraxane held secondary to neuropathy)  CT abdomen/pelvis 03/03/2020-no significant change in the appearance of tiny bilateral lung nodules at the lower lung zones.  Previously characterized subcapsular metastatic liver lesion not seen on current study.  No new focal liver abnormality.  No biliary ductal dilatation.  Index lesion distal tail of pancreas smaller.  Interval development of large volume ascites within the abdomen and pelvis.  No discrete peritoneal nodule.  Portal vein patent.  Esophageal and gastric varices identified similar to prior exam.  6.Port-A-Cath placement by Dr. 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  Hospitalization  03/03/2020 with weakness, marked abdominal distention   CT abdomen/pelvis 03/03/2020-no significant change in the appearance of tiny bilateral lung nodules at the lower lung zones.  Previously characterized subcapsular metastatic liver lesion not seen on current study.  No new focal liver abnormality.  No biliary ductal dilatation.  Index lesion distal tail of pancreas smaller.  Interval development of large volume ascites within the abdomen and pelvis.  No discrete peritoneal nodule.  Portal vein patent.  Esophageal and gastric varices identified similar to prior exam.  Brain CT 03/03/2020-negative  Paracentesis 03/04/2019 21-5 L of fluid removed.  Cytology, other studies pending.  10.  1 set of blood cultures from the Port-A-Cath positive for staph epidermidis   [name] is a 70 year old man with metastatic pancreas cancer currently on active treatment with gemcitabine/Abraxane.  Most recent treatment was 02/13/2020, gemcitabine alone, Abraxane held due to neuropathy.  He was admitted yesterday with weakness and abdominal distention.  CT scan shows large volume ascites.  He underwent a paracentesis yesterday with 5 L of  fluid removed.  Cytology is pending.  Recommendations: 1. Follow-up cytology, if disease progression is confirmed consider hospice versus salvage chemotherapy 2. Consider GI consult for evaluation and treatment of portal hypertension   LOS: 1 day    Ned Card, ANP/GNP-BC 03/04/2020 2:43 PM [name] was interviewed and examined.  He is well-known to [place] with a history of metastatic pancreas cancer, currently being treated with gemcitabine chemotherapy.  He presented with failure to thrive and increased ascites.  The etiology of the ascites is unclear.  Cytology is pending.  There is no evidence of progressive pancreas cancer other than the development of ascites.  The acute development of ascites would be unusual for malignancy.  Does he have cirrhosis?  I suspect the blood culture is a contaminant.   I will check on him 03/05/2020.

## 2020-03-04 NOTE — Consult Note (Signed)
Consultation Note Date: 03/04/2020   Patient Name: Alex [name]  DOB: 12/09/1950  MRN: [id number]  Age / Sex: 69 y.o., male  PCP: Antonietta Jewel, MD Referring Physician: Antonieta Pert, MD  Reason for Consultation: Establishing goals of care  HPI/Patient Profile: 69 y.o. male  with past medical history of  admitted on 03/03/2020 with confusion, weakness, and worsened abdominal swelling.  He is s/p 5L paracentesis.  He is on broad spectrum abx with concern for possible PNA.  He did have one positive blood culture from his port and vanc added while await further culture data.  Palliative consulted for [place].   Clinical Assessment and Goals of Care: I met today with Alex [name].  He reports feeling much better than admission.  Reports that he spoke with Dr. Benay Spice and they are awaiting results of cytology, but thought at this point is that this may be more of a separate liver issue rather than cancer that is causing ascites.  We discussed clinical course as well as wishes moving forward in regard to advanced directives.  Discussed the fact that he came into the hospital with confusion and this may quite possibly happen again in the future.  He was open to conversation and we discussed recommendation to outline his wishes for the future to ensure that his care is in line with what he desires.  He reports that family has been encouraging him to complete living will and he has one at home but has not really reviewed it.  We discussed surrogate decision making and how to focus on interventions that are most likely to allow him to spend quality time with family.    I also introduced MOST form and left one for his review.  He will call if he desires to complete this admission.  Questions and concerns addressed.   PMT will continue to support holistically.  SUMMARY OF RECOMMENDATIONS   - Full code/full scope - He would like  to name his son as his HCPOA.  Spiritual care consult placed. - Introduced MOST for as a way to outline his care wishes moving forward in the even that he again becomes unable to speak for himself.  He will review for and discuss with family.  He will reach out if he desires to complete MOST prior to discharge.     Primary Diagnoses: Present on Admission: . Ascites . Anemia . Abdominal distension . Nausea & vomiting . 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 Nausea and vomiting . Benign essential HTN   I have reviewed the medical record, interviewed the patient and family, and examined the patient. The following aspects are pertinent.  Past Medical History:  Diagnosis Date  . Abdominal distension 09/14/2017  . 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 Anemia 08/26/2017  . Aortic atherosclerosis ([place]) 08/27/2017  . Atherosclerosis of arteries   . Carotid artery occlusion   . Colon obstruction ([place])   . Colon polyps   . Community acquired pneumonia  of right lower lobe of lung 08/04/2017  . Dehydration   . Empyema ([place]) 08/27/2017  . Empyema of right pleural space ([place]) 08/30/2017  . High cholesterol   . History of stroke 08/04/2017  . Hypertension   . Hypokalemia 09/14/2017  . Hyponatremia 08/26/2017  . Large bowel obstruction ([place]) 09/13/2017  . Lobar pneumonia ([place]) 08/27/2017  . Malignant tumor of sigmoid colon ([place])   . Nausea & vomiting 09/14/2017  . Nausea and vomiting 09/14/2017  . Pleural effusion 08/25/2017  . Pleural effusion, right 08/27/2017  . Pneumonia   . Pulmonary nodule, right 08/27/2017  . Stroke [facility]) 2010   denies residual on 09/18/2017   Social History   Socioeconomic History  . Marital status: Divorced    Spouse name: Not on file  . Number of children: 2  . Years of education: Not on file  . Highest education level: Not on file  Occupational History  . Not on file  Tobacco Use  . Smoking status: Never Smoker  .  Smokeless tobacco: Never Used  Vaping Use  . Vaping Use: Never used  Substance and Sexual Activity  . Alcohol use: Not Currently    Alcohol/week: 0.0 standard drinks    Comment: 09/18/2017 "nothing since the 1990s"  . Drug use: Yes    Types: Marijuana    Comment: 09/18/2017 "nothing since the 1980s"  . Sexual activity: Not Currently  Other Topics Concern  . Not on file  Social History Narrative  . Not on file   Social Determinants of Health   Financial Resource Strain:   . Difficulty of Paying Living Expenses: Not on file  Food Insecurity:   . Worried About Charity fundraiser in the Last Year: Not on file  . Ran Out of Food in the Last Year: Not on file  Transportation Needs:   . Lack of Transportation (Medical): Not on file  . Lack of Transportation (Non-Medical): Not on file  Physical Activity:   . Days of Exercise per Week: Not on file  . Minutes of Exercise per Session: Not on file  Stress:   . Feeling of Stress : Not on file  Social Connections:   . Frequency of Communication with Friends and Family: Not on file  . Frequency of Social Gatherings with Friends and Family: Not on file  . Attends Religious Services: Not on file  . Active Member of Clubs or Organizations: Not on file  . Attends Archivist Meetings: Not on file  . Marital Status: Not on file   Family History  Problem Relation Age of Onset  . Heart disease Other   . Hypertension Father   . Stroke Father   . Heart disease Father   . Diabetes Sister   . Diabetes Brother   . Head & neck cancer Brother    Scheduled Meds: . amLODipine  10 mg Oral Daily  . aspirin  81 mg Oral Daily  . atenolol  25 mg Oral Daily  . Chlorhexidine Gluconate Cloth  6 each Topical Daily  . clopidogrel  75 mg Oral Daily  . docusate sodium  100 mg Oral BID  . furosemide  20 mg Intravenous Q8H  . heparin  5,000 Units Subcutaneous Q8H  . simvastatin  20 mg Oral QHS  . sodium chloride flush  10-40 mL Intracatheter  Q12H  . sodium chloride flush  3 mL Intravenous Q12H   Continuous Infusions: . sodium chloride 250 mL (03/04/20 1251)  . cefTRIAXone (  ROCEPHIN)  IV 2 g (03/04/20 6546)  . vancomycin     PRN Meds:.sodium chloride, acetaminophen **OR** acetaminophen, albuterol, HYDROmorphone (DILAUDID) injection, levalbuterol, ondansetron **OR** ondansetron (ZOFRAN) IV, oxyCODONE, senna-docusate, sodium chloride flush, sorbitol, traZODone Medications Prior to Admission:  Prior to Admission medications   Medication Sig Start Date End Date Taking? Authorizing Provider  acetaminophen (TYLENOL) 500 MG tablet Take 2 tablets (1,000 mg total) by mouth every 6 (six) hours as needed. Patient taking differently: Take 500 mg by mouth every 6 (six) hours as needed for moderate pain.  09/04/17  Yes Barrett, Erin R, PA-C  amLODipine (NORVASC) 10 MG tablet Take 10 mg by mouth daily.   Yes [provider]  aspirin 81 MG tablet Take 81 mg by mouth daily. 05/01/18  Yes [provider]  atenolol (TENORMIN) 25 MG tablet Take 25 mg by mouth daily.   Yes [provider]  clopidogrel (PLAVIX) 75 MG tablet Take 75 mg by mouth daily.   Yes [provider]  gabapentin (NEURONTIN) 100 MG capsule Take 3 capsules (300 mg total) by mouth at bedtime. 12/29/19  Yes Ladell Pier, MD  hydrochlorothiazide (HYDRODIURIL) 25 MG tablet Take 25 mg by mouth daily.   Yes [provider]  lidocaine-prilocaine (EMLA) cream Apply 1 application topically as needed. Apply to portacath  1 1/2 -  2 hours prior to procedures as needed. Patient taking differently: Apply 1 application topically as needed (for port access).  10/31/19  Yes Owens Shark, NP  potassium chloride SA (KLOR-CON) 20 MEQ tablet Take 1 tablet (20 mEq total) by mouth daily. 07/11/19  Yes Ladell Pier, MD  prochlorperazine (COMPAZINE) 10 MG tablet Take 1 tablet (10 mg total) by mouth every 6 (six) hours as needed for nausea or vomiting. Patient  taking differently: Take 10 mg by mouth daily.  01/16/20  Yes Ladell Pier, MD  simvastatin (ZOCOR) 20 MG tablet Take 20 mg by mouth at bedtime.    Yes [provider]   No Known Allergies Review of Systems  Constitutional: Positive for activity change and fatigue.  Gastrointestinal: Positive for abdominal distention.  Neurological: Positive for weakness.  Psychiatric/Behavioral: Positive for confusion. Negative for sleep disturbance.   Physical Exam General: Alert, awake, in no acute distress.  HEENT: No bruits, no goiter, no JVD Heart: Regular rate and rhythm. Lungs: Good air movement, clear Ext: No significant edema Skin: Warm and dry Neuro: Grossly intact, nonfocal.   Vital Signs: BP 101/60 (BP Location: Right Arm)   Pulse 63   Temp 97.7 F (36.5 C) (Oral)   Resp 18   Ht '5\' 6"'  (1.676 m)   Wt 98.9 kg   SpO2 99%   BMI 35.19 kg/m  Pain Scale: 0-10   Pain Score: 0-No pain   SpO2: SpO2: 99 % O2 Device:SpO2: 99 % O2 Flow Rate: .   IO: Intake/output summary:   Intake/Output Summary (Last 24 hours) at 03/04/2020 2051 Last data filed at 03/04/2020 1300 Gross per 24 hour  Intake 860 ml  Output 600 ml  Net 260 ml    LBM: Last BM Date: 03/04/20 Baseline Weight: Weight: 98.9 kg Most recent weight: Weight: 98.9 kg     Palliative Assessment/Data:   Flowsheet Rows     Most Recent Value  Intake Tab  Referral Department Hospitalist  Unit at Time of Referral Med/Surg Unit  Palliative Care Primary Diagnosis Cancer  Date Notified 03/03/20  Palliative Care Type New Palliative care  Reason  for referral Clarify Goals of Care  Date of Admission 03/03/20  Date first seen by Palliative Care 03/04/20  # of days Palliative referral response time 1 Day(s)  # of days IP prior to Palliative referral 0  Clinical Assessment  Palliative Performance Scale Score 70%  Psychosocial & Spiritual Assessment  Palliative Care Outcomes  Patient/Family meeting held? Yes    Who was at the meeting? Patient  Palliative Care Outcomes Clarified goals of care    Start: 5208 End: 1700 Time Total: 50 minutes Greater than 50%  of this time was spent counseling and coordinating care related to the above assessment and plan.  Signed by: Micheline Rough, MD   Please contact Palliative Medicine Team phone at [phone] for questions and concerns.  For individual provider: See Shea Evans

## 2020-03-04 NOTE — Progress Notes (Signed)
PROGRESS NOTE    Alex [name]  ZOX:[id number] DOB: [birth year] DOA: 03/03/2020 PCP: Antonietta Jewel, MD   Chief Complaint  Patient presents with  . Aphasia  . 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 Learta Codding, HTN, CAD, HLD, stroke, adenocarcinoma sigmoid resection colostomy, chronic anemia presented with progressive weakness fall progressive distention of abdomen.  CT abdomen in the ED showed decrease in size of distal tail of pancreas lesion, large volume ascites, metastatic lesion within the liver not confidently seen on this study compared to previous 1 and no new focal liver abnormalities, bilateral lung nodules CT head no finding.  Patient was admitted underwent abdominal paracentesis with 5 L fluid removal.  Subjective: Patient feels much better today. Abdomen is less distended. afebrile overnight.  No acute events.  Assessment & Plan:  Large volume Ascites in the setting of metastatic pancreatic cancer.  Status post 5 L paracentesis fluid removal by IR.  Dr. Benay Spice from oncology on consult.  Fluid analysis shows neutrophil count 6, LDH 29, Gram stain no organisms seen, WBC present, culture pending.  Protein less than 3. Cytology pending.  Gram-positive cocci in 1 blood culture from Port-A-Cath. added vancomycin for now pending further culture data.  Possible community-acquired pneumonia on empiric antibiotics.  Repeat imaging in am.  AKI likely in the setting of ascites/diuretics/decreased oral intake.  Monitor closely. Encouraged oral hydration Recent Labs  Lab 03/03/20 1238 03/04/20 0330  BUN 42* 44*  CREATININE 1.90* 1.82*   Cancer of pancreas, tail: CT head demonstrates a decrease in size of the mass as well as metastatic lesion in the liver.  Dr. Learta Codding has been consulted from oncology- I have notified him today.  History of stroke/HTN/HLD: BP is controlled.  Continue his home aspirin, Plavix,  amlodipine, atenolol, Lasix and simvastatin.  Hyponatremia sodium at 130.  Monitor closely. Avoid ivf 2/2 ascites.  Anemia: Noted some drop in hemoglobin likely from anemia of chronic disease/malignancy.  Monitor Recent Labs  Lab 03/03/20 1238 03/04/20 0330  HGB 13.1 10.7*  HCT 40.4 32.9*   Nausea & vomiting: Continue supportive measures.  As needed Zofran.  Nutrition: Diet Order            Diet regular Room service appropriate? Yes; Fluid consistency: Thin  Diet effective now                  Body mass index is 35.19 kg/m.  DVT prophylaxis: heparin injection 5,000 Units Start: 03/03/20 1600 SCDs Start: 03/03/20 1504 Place TED hose Start: 03/03/20 1504 Code Status:   Code Status: Full Code  Family Communication: plan of care discussed with patient at bedside.  Status is: Inpatient Remains inpatient appropriate because:IV treatments appropriate due to intensity of illness or inability to take PO and Inpatient level of care appropriate due to severity of illness  Dispo: The patient is from: Home              Anticipated d/c is to: Home PT/OT evaluation today.              Anticipated d/c date is: 2 days              Patient currently is not medically stable to d/c. Consultants:see note  Procedures:see note  Culture/Microbiology    Component Value Date/Time   SDES PERITONEAL 03/03/2020 [place] 03/03/2020 1638   CULT PENDING 03/03/2020 1638   REPTSTATUS PENDING 03/03/2020 1638  Other culture-see note  Medications: Scheduled Meds: . amLODipine  10 mg Oral Daily  . aspirin  81 mg Oral Daily  . atenolol  25 mg Oral Daily  . clopidogrel  75 mg Oral Daily  . docusate sodium  100 mg Oral BID  . furosemide  20 mg Intravenous Q8H  . gabapentin  300 mg Oral QHS  . heparin  5,000 Units Subcutaneous Q8H  . simvastatin  20 mg Oral QHS  . sodium chloride flush  3 mL Intravenous Q12H   Continuous Infusions: . albumin human 25 g (03/04/20 0426)  .  cefTRIAXone (ROCEPHIN)  IV 2 g (03/04/20 1610)    Antimicrobials: Anti-infectives (From admission, onward)   Start     Dose/Rate Route Frequency Ordered Stop   03/04/20 0500  cefTRIAXone (ROCEPHIN) 2 g in sodium chloride 0.9 % 100 mL IVPB        2 g 200 mL/hr over 30 Minutes Intravenous Every 24 hours 03/03/20 1511     03/03/20 1500  vancomycin (VANCOREADY) IVPB 2000 mg/400 mL        2,000 mg 200 mL/hr over 120 Minutes Intravenous  Once 03/03/20 1447 03/03/20 2012   03/03/20 1445  ceFEPIme (MAXIPIME) 2 g in sodium chloride 0.9 % 100 mL IVPB        2 g 200 mL/hr over 30 Minutes Intravenous  Once 03/03/20 1435 03/03/20 1811     Objective: Vitals: Today's Vitals   03/03/20 1905 03/03/20 2045 03/04/20 0227 03/04/20 0540  BP:  115/75 114/71 124/73  Pulse:  79 67 75  Resp:  19 18 17   Temp:  97.6 F (36.4 C) 98.5 F (36.9 C) 97.7 F (36.5 C)  TempSrc:  Oral Oral Oral  SpO2:  99% 99% 98%  Weight:      Height:      PainSc: 0-No pain       Intake/Output Summary (Last 24 hours) at 03/04/2020 0745 Last data filed at 03/04/2020 0600 Gross per 24 hour  Intake 183 ml  Output 50 ml  Net 133 ml   Filed Weights   03/03/20 1133  Weight: 98.9 kg   Weight change:   Intake/Output from previous day: 10/06 0701 - 10/07 0700 In: 183 [P.O.:180; I.V.:3] Out: 50 [Urine:50] Intake/Output this shift: No intake/output data recorded.  Examination: General exam: AAOx3 ,NAD, weak appearing. HEENT:Oral mucosa moist, Ear/Nose WNL grossly,dentition normal. Respiratory system: bilaterally clear,no wheezing or crackles,no use of accessory muscle, non tender. Cardiovascular system: S1 & S2 +, regular, No JVD. Gastrointestinal system: Abdomen soft, moderately distended, nontender,BS+. Nervous System:Alert, awake, moving extremities and grossly nonfocal Extremities: leg edema, distal peripheral pulses palpable.  Skin: No rashes,no icterus. MSK: Normal muscle bulk,tone, power  Data Reviewed: I  have personally reviewed following labs and imaging studies CBC: Recent Labs  Lab 03/03/20 1238 03/04/20 0330  WBC 13.4* 9.1  NEUTROABS 9.7*  --   HGB 13.1 10.7*  HCT 40.4 32.9*  MCV 92.7 92.9  PLT 255 960   Basic Metabolic Panel: Recent Labs  Lab 03/03/20 1237 03/03/20 1238 03/04/20 0330  NA  --  131* 130*  K  --  5.1 4.8  CL  --  100 100  CO2  --  23 22  GLUCOSE  --  158* 107*  BUN  --  42* 44*  CREATININE  --  1.90* 1.82*  CALCIUM 8.0* 7.8* 7.6*  MG 2.9*  --   --   PHOS 3.9  --   --  GFR: Estimated Creatinine Clearance: 42.7 mL/min (A) (by C-G formula based on SCr of 1.82 mg/dL (H)). Liver Function Tests: Recent Labs  Lab 03/03/20 1238  AST 61*  ALT 34  ALKPHOS 167*  BILITOT 1.6*  PROT 6.3*  ALBUMIN 2.5*   No results for input(s): LIPASE, AMYLASE in the last 168 hours. No results for input(s): AMMONIA in the last 168 hours. Coagulation Profile: Recent Labs  Lab 03/03/20 1504  INR 1.3*   Cardiac Enzymes: No results for input(s): CKTOTAL, CKMB, CKMBINDEX, TROPONINI in the last 168 hours. BNP (last 3 results) No results for input(s): PROBNP in the last 8760 hours. HbA1C: Recent Labs    03/03/20 1700  HGBA1C 5.6   CBG: No results for input(s): GLUCAP in the last 168 hours. Lipid Profile: No results for input(s): CHOL, HDL, LDLCALC, TRIG, CHOLHDL, LDLDIRECT in the last 72 hours. Thyroid Function Tests: Recent Labs    03/03/20 1700  TSH 3.191   Anemia Panel: No results for input(s): VITAMINB12, FOLATE, FERRITIN, TIBC, IRON, RETICCTPCT in the last 72 hours. Sepsis Labs: No results for input(s): PROCALCITON, LATICACIDVEN in the last 168 hours.  Recent Results (from the past 240 hour(s))  Respiratory Panel by RT PCR (Flu A&B, Covid) - Nasopharyngeal Swab     Status: None   Collection Time: 03/03/20  2:18 PM   Specimen: Nasopharyngeal Swab  Result Value Ref Range Status   SARS Coronavirus 2 by RT PCR NEGATIVE NEGATIVE Final    Comment:  (NOTE) SARS-CoV-2 target nucleic acids are NOT DETECTED.  The SARS-CoV-2 RNA is generally detectable in upper respiratoy specimens during the acute phase of infection. The lowest concentration of SARS-CoV-2 viral copies this assay can detect is 131 copies/mL. A negative result does not preclude SARS-Cov-2 infection and should not be used as the sole basis for treatment or other patient management decisions. A negative result may occur with  improper specimen collection/handling, submission of specimen other than nasopharyngeal swab, presence of viral mutation(s) within the areas targeted by this assay, and inadequate number of viral copies (<131 copies/mL). A negative result must be combined with clinical observations, patient history, and epidemiological information. The expected result is Negative.  Fact Sheet for Patients:  PinkCheek.be  Fact Sheet for Healthcare Providers:  GravelBags.it  This test is no t yet approved or cleared by the [place] FDA and  has been authorized for detection and/or diagnosis of SARS-CoV-2 by FDA under an Emergency Use Authorization (EUA). This EUA will remain  in effect (meaning this test can be used) for the duration of the COVID-19 declaration under Section 564(b)(1) of the Act, 21 U.S.C. section 360bbb-3(b)(1), unless the authorization is terminated or revoked sooner.     Influenza A by PCR NEGATIVE NEGATIVE Final   Influenza B by PCR NEGATIVE NEGATIVE Final    Comment: (NOTE) The Xpert Xpress SARS-CoV-2/FLU/RSV assay is intended as an aid in  the diagnosis of influenza from Nasopharyngeal swab specimens and  should not be used as a sole basis for treatment. Nasal washings and  aspirates are unacceptable for Xpert Xpress SARS-CoV-2/FLU/RSV  testing.  Fact Sheet for Patients: PinkCheek.be  Fact Sheet for Healthcare  Providers: GravelBags.it  This test is not yet approved or cleared by the [place] FDA and  has been authorized for detection and/or diagnosis of SARS-CoV-2 by  FDA under an Emergency Use Authorization (EUA). This EUA will remain  in effect (meaning this test can be used) for the duration of the  Covid-19 declaration  under Section 564(b)(1) of the Act, 21  U.S.C. section 360bbb-3(b)(1), unless the authorization is  terminated or revoked. Performed at [facility], [place] [address] [place], [place] [address]   Body fluid culture     Status: None (Preliminary result)   Collection Time: 03/03/20  4:38 PM   Specimen: PATH Cytology Peritoneal fluid  Result Value Ref Range Status   Specimen Description PERITONEAL  Final   Special Requests NONE  Final   Gram Stain   Final    WBC PRESENT, PREDOMINANTLY MONONUCLEAR NO ORGANISMS SEEN CYTOSPIN SMEAR Gram Stain Report Called to,Read Back By and Verified With: C.SIMPSON AT 1848 ON 03/03/20 BY N.THOMPSON Performed at [facility], [place] [address] [place], [place] [address]    Culture PENDING  Incomplete   Report Status PENDING  Incomplete     Radiology Studies: DG Chest 2 View  Result Date: 03/03/2020 CLINICAL DATA:  Weakness. EXAM: CHEST - 2 VIEW COMPARISON:  September 20, 2018. FINDINGS: Stable cardiomediastinal silhouette. Right internal jugular Port-A-Cath is unchanged in position. No pneumothorax is noted. Left lung is clear. Elevated right hemidiaphragm is noted with mild right basilar subsegmental atelectasis or infiltrate. No significant pleural effusion is noted. Bony thorax is unremarkable. IMPRESSION: Elevated right hemidiaphragm with mild right basilar subsegmental atelectasis or infiltrate. Electronically Signed   By: Marijo Conception M.D.   On: 03/03/2020 12:30   CT Head Wo Contrast  Result Date: 03/03/2020 CLINICAL DATA:  Slurred speech, generalized weakness EXAM:  CT HEAD WITHOUT CONTRAST TECHNIQUE: Contiguous axial images were obtained from the base of the skull through the vertex without intravenous contrast. COMPARISON:  MRI 02/15/2011 FINDINGS: Brain: No evidence of acute infarction, hemorrhage, hydrocephalus, extra-axial collection or mass lesion/mass effect. Encephalomalacia within the posterior left temporoparietal region at site of previously seen infarct. Vascular: Atherosclerotic calcifications involving the large vessels of the skull base. No unexpected hyperdense vessel. Skull: Normal. Negative for fracture or focal lesion. Sinuses/Orbits: No acute finding. Other: None. IMPRESSION: No acute intracranial findings. Electronically Signed   By: Davina Poke D.O.   On: 03/03/2020 14:28   CT ABDOMEN PELVIS W CONTRAST  Result Date: 03/03/2020 CLINICAL DATA:  Abdominal distension. History of metastatic pancreas cancer. EXAM: CT ABDOMEN AND PELVIS WITH CONTRAST TECHNIQUE: Multidetector CT imaging of the abdomen and pelvis was performed using the standard protocol following bolus administration of intravenous contrast. CONTRAST:  39mL OMNIPAQUE IOHEXOL 300 MG/ML  SOLN COMPARISON:  12/25/2019 FINDINGS: Lower chest: Pleuroparenchymal scarring is again noted within the right lung. No substantial change in the appearance of tiny bilateral lung nodules within the imaged portions of the lower lung zones. No acute abnormality identified. Hepatobiliary: Previously characterized subcapsular metastatic lesion is not confidently seen on today's study. No new focal liver abnormality. Within the right hepatic lobe gallbladder unremarkable. No biliary ductal dilatation. Pancreas: No signs of pancreatic inflammation or main duct dilatation. The index lesion within distal tail of pancreas measures 2.0 x 1.6 cm, image 36/2. Previously 3.0 x 1.6 cm. Spleen: Normal in size without focal abnormality. Adrenals/Urinary Tract: Normal appearance of the adrenal glands. No kidney mass or  hydronephrosis identified. Similar appearance of scarring and volume loss from the inferior pole cortex of the left kidney. Urinary bladder appears normal. Stomach/Bowel: Stomach is nondistended. No bowel wall thickening, inflammation, or distension identified. Left lower quadrant colostomy identified. 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 Vascular/Lymphatic: Aortic atherosclerosis. No aneurysm. The portal vein is patent. 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 No abdominopelvic adenopathy. Reproductive: Prostate is unremarkable. Other: Interval development of large volume of ascites within the abdomen and pelvis. No discrete peritoneal nodule. Musculoskeletal: No acute or significant osseous findings. IMPRESSION: 1. Interval development of large volume of ascites within the abdomen and pelvis. No discrete peritoneal nodule identified. Etiology is indeterminate. Differential considerations include (but not limited to): Portal venous hypertension, CHF, or peritoneal metastasis. Diagnostic and therapeutic paracentesis may be helpful for further investigation. 2. Decrease in size of distal tail of pancreas lesion. 3. Previously characterized subcapsular metastatic lesion within the liver is not confidently seen on today's study. No new focal liver abnormality identified. 4. Similar appearance of tiny bilateral lung nodules within the imaged portions of the lower lung zones. 5. Aortic atherosclerosis. Aortic Atherosclerosis (ICD10-I70.0). Electronically Signed   By: Kerby Moors M.D.   On: 03/03/2020 14:37   US Paracentesis  Result Date: 03/03/2020 INDICATION: Patient with a history of colon cancer presents with new onset ascites. Interventional radiology asked to perform a therapeutic and diagnostic paracentesis. EXAM: ULTRASOUND GUIDED PARACENTESIS MEDICATIONS: 1% lidocaine 10 mL COMPLICATIONS: None immediate. PROCEDURE: Informed written consent was obtained from the  patient after a discussion of the risks, benefits and alternatives to treatment. A timeout was performed prior to the initiation of the procedure. Initial ultrasound scanning demonstrates a large amount of ascites within the right lower abdominal quadrant. The right lower abdomen was prepped and draped in the usual sterile fashion. 1% lidocaine was used for local anesthesia. Following this, a 19 gauge, 7-cm, Yueh catheter was introduced. An ultrasound image was saved for documentation purposes. The paracentesis was performed. The catheter was removed and a dressing was applied. The patient tolerated the procedure well without immediate post procedural complication. FINDINGS: A total of approximately 5 L of clear fluid was removed. Samples were sent to the laboratory as requested by the clinical team. IMPRESSION: Successful ultrasound-guided paracentesis yielding 5 liters of peritoneal fluid. Read by: Soyla Dryer, NP Electronically Signed   By: Lucrezia Europe M.D.   On: 03/03/2020 16:31     LOS: 1 day   Antonieta Pert, MD [facility]  03/04/2020, 7:45 AM

## 2020-03-04 NOTE — Progress Notes (Signed)
Pt still leaking from paracentesis site on rt side.  Also a small hole has appeared on the same side and it is leaking blood. MD notified.

## 2020-03-04 NOTE — Consult Note (Signed)
[facility] Nurse ostomy consult note Stoma type/location: LLQ colostomy with parastomal hernia Stomal assessment/size: 2 and 1/8 inches round, raised, red, and moist with lumen at center Peristomal assessment: large parastomal hernia, does not reduce in the supine position. Small area of Peristomal MASD located 1.5inches from stoma at 9 o'clock. This is dusted with stoma powder prior to pouching. Treatment options for stomal/peristomal skin: change pouching system to 2 piece with floating flange Output: small amount of soft, 1/2 inch stool balls, light brown Ostomy pouching: 2pc. 2 and 2/4 inch pouching system with skin barrier ring Education provided: Patient taught that with his stoma size, he is nearly at the maximum cutting surface of the 1-piece pouch he has been using, which may be resulting in leakages. Enrolled patient in [place] program: No. Patient is already established with an ostomy supplier in conjunction with his previous admission.  Patient states that his diet has been altered due to chemotherapy and that he has good family support. He ahs ben consuming most liquids, soups, drinks but has some appetite today and has ordered [place] for breakfast.  His son cares for his ostomy and his daughter orders the supplies.  Additional supplies provided to bedside:  6 skin barrier rings, 6 2 and 3/4 inch skin barriers, 6 2 and 3/4 inch pouches  [facility] nursing team will not follow, but will remain available to this patient, the nursing and medical teams.  Please re-consult if needed. Thanks, Maudie Flakes, MSN, RN, [place], Arther Abbott  Pager# [phone]

## 2020-03-05 ENCOUNTER — Telehealth: Payer: Self-pay | Admitting: Nurse Practitioner

## 2020-03-05 ENCOUNTER — Inpatient Hospital Stay: Payer: Medicare Other | Attending: Nurse Practitioner | Admitting: Nurse Practitioner

## 2020-03-05 ENCOUNTER — Inpatient Hospital Stay: Payer: Medicare Other

## 2020-03-05 DIAGNOSIS — R140 Abdominal distension (gaseous): Secondary | ICD-10-CM

## 2020-03-05 DIAGNOSIS — R188 Other ascites: Secondary | ICD-10-CM | POA: Diagnosis not present

## 2020-03-05 DIAGNOSIS — C252 Malignant neoplasm of tail of pancreas: Secondary | ICD-10-CM | POA: Insufficient documentation

## 2020-03-05 DIAGNOSIS — R7881 Bacteremia: Secondary | ICD-10-CM

## 2020-03-05 DIAGNOSIS — K766 Portal hypertension: Secondary | ICD-10-CM

## 2020-03-05 DIAGNOSIS — N179 Acute kidney failure, unspecified: Secondary | ICD-10-CM | POA: Diagnosis not present

## 2020-03-05 DIAGNOSIS — Z5111 Encounter for antineoplastic chemotherapy: Secondary | ICD-10-CM | POA: Insufficient documentation

## 2020-03-05 DIAGNOSIS — C787 Secondary malignant neoplasm of liver and intrahepatic bile duct: Secondary | ICD-10-CM | POA: Insufficient documentation

## 2020-03-05 DIAGNOSIS — R180 Malignant ascites: Secondary | ICD-10-CM | POA: Diagnosis not present

## 2020-03-05 LAB — CBC
HCT: 29.5 % — ABNORMAL LOW (ref 39.0–52.0)
Hemoglobin: 9.7 g/dL — ABNORMAL LOW (ref 13.0–17.0)
MCH: 29.9 pg (ref 26.0–34.0)
MCHC: 32.9 g/dL (ref 30.0–36.0)
MCV: 91 fL (ref 80.0–100.0)
Platelets: 118 10*3/uL — ABNORMAL LOW (ref 150–400)
RBC: 3.24 MIL/uL — ABNORMAL LOW (ref 4.22–5.81)
RDW: 19.6 % — ABNORMAL HIGH (ref 11.5–15.5)
WBC: 6.7 10*3/uL (ref 4.0–10.5)
nRBC: 0 % (ref 0.0–0.2)

## 2020-03-05 LAB — LIPASE, FLUID: Lipase-Fluid: 58 U/L

## 2020-03-05 LAB — BASIC METABOLIC PANEL
Anion gap: 8 (ref 5–15)
BUN: 47 mg/dL — ABNORMAL HIGH (ref 8–23)
CO2: 24 mmol/L (ref 22–32)
Calcium: 7.7 mg/dL — ABNORMAL LOW (ref 8.9–10.3)
Chloride: 101 mmol/L (ref 98–111)
Creatinine, Ser: 1.66 mg/dL — ABNORMAL HIGH (ref 0.61–1.24)
GFR calc non Af Amer: 42 mL/min — ABNORMAL LOW (ref >60)
Glucose, Bld: 102 mg/dL — ABNORMAL HIGH (ref 70–99)
Potassium: 3.9 mmol/L (ref 3.5–5.1)
Sodium: 133 mmol/L — ABNORMAL LOW (ref 135–145)

## 2020-03-05 LAB — CYTOLOGY - NON PAP

## 2020-03-05 LAB — GLUCOSE, CAPILLARY: Glucose-Capillary: 88 mg/dL (ref 70–99)

## 2020-03-05 LAB — SODIUM, URINE, RANDOM: Sodium, Ur: <10 mmol/L

## 2020-03-05 MED ORDER — SPIRONOLACTONE 25 MG PO TABS
50.0000 mg | ORAL_TABLET | Freq: Every day | ORAL | Status: DC
  Administered 2020-03-05 – 2020-03-09 (×5): 50 mg via ORAL
  Filled 2020-03-05 (×5): qty 2

## 2020-03-05 MED ORDER — VANCOMYCIN HCL 1500 MG/300ML IV SOLN
1500.0000 mg | INTRAVENOUS | Status: DC
  Administered 2020-03-05 – 2020-03-07 (×3): 1500 mg via INTRAVENOUS
  Filled 2020-03-05 (×4): qty 300

## 2020-03-05 NOTE — Progress Notes (Addendum)
Pharmacy Antibiotic Note  Alex [name] is a 69 y.o. male with PMH of metastatic pancreatic cancer admitted on 03/03/2020 with large volume ascites, s/p paracentesis. Blood cultures from port-a-cath showed 2 species of staph (MRSE) and peripheral blood cultures NGTD. ID consulted and recommends repeating peripheral blood cultures and continuing Vancomycin for now. Per ID, if peripheral blood cultures positive, then will need echo, workup for endocarditis, and removal of port-a-cath. Pharmacy has been consulted for Vancomycin dosing.  Plan:  Resume Vancomycin at 1500mg  IV q24h  Vancomycin trough level at steady state, as indicated  Monitor renal function, cultures, clinical course, further ID recommendations   Height: 5\' 6"  (167.6 cm) Weight: 98.7 kg (217 lb 9.5 oz) IBW/kg (Calculated) : 63.8  Temp (24hrs), Avg:98.1 F (36.7 C), Min:98 F (36.7 C), Max:98.2 F (36.8 C)  Recent Labs  Lab 03/03/20 1238 03/04/20 0330 03/05/20 0314  WBC 13.4* 9.1 6.7  CREATININE 1.90* 1.82* 1.66*    Estimated Creatinine Clearance: 46.9 mL/min (A) (by C-G formula based on SCr of 1.66 mg/dL (H)).    No Known Allergies  Antimicrobials this admission: 10/6 Cefepime x 1 10/6 Vancomycin >> 10/7 Ceftriaxone >>  Microbiology results: 10/6 port-a-cath BCx: Staph epi/Staph hominis 10/6 peripheral BCx: NGTD 10/6 peritoneal fluid: NGTD 10/8 repeat peripheral BCx: ordered  Thank you for allowing pharmacy to be a part of this patient's care.    Lindell Spar, PharmD, BCPS Clinical Pharmacist  03/05/2020 6:25 PM

## 2020-03-05 NOTE — Progress Notes (Signed)
PROGRESS NOTE    Alex [name]  HUT:[id number] DOB: 1951/02/05 DOA: 03/03/2020 PCP: Antonietta Jewel, MD   Chief Complaint  Patient presents with  . Aphasia  . 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 Learta Codding, HTN, CAD, HLD, stroke, adenocarcinoma sigmoid resection colostomy, chronic anemia presented with progressive weakness fall progressive distention of abdomen.  CT abdomen in the ED showed decrease in size of distal tail of pancreas lesion, large volume ascites, metastatic lesion within the liver not confidently seen on this study compared to previous 1 and no new focal liver abnormalities, bilateral lung nodules CT head no finding.  Patient was admitted underwent abdominal paracentesis with 5 L fluid removal.  Subjective: Afebrile overnight on room air. Patient leaking ascitic fluid from paracentesis site Creatinine downtrending 1.6. Has bilateral leg edema.  Assessment & Plan:  Large volume Ascites in the setting of metastatic pancreatic cancer.s/p 5 L paracentesis fluid removal by IR.  Dr. Benay Spice from oncology following, awaiting on cytology. Fluid analysis shows neutrophil count 6, LDH 29, Gram stain no organisms seen, WBC present, culture pending.  Protein less than 3.  Continue with empiric antibiotics, continue IV Lasix.  Portal hypertension, will obtain GI evaluation per oncology.  Continue Lasix.  Staph epidermidis bacteremia in  1 blood culture from Port-A-Cath.  Likely colonization.  Patient's pharmacy will DC vancomycin and monitor.  Will discuss with infectious disease. he is afebrile and no leicocytosis.  Anasarca lower leg edema.  Continue iv Lasix  Possible community-acquired pneumonia on empiric antibiotics.He has no respiratory symptoms.  AKI likely in the setting of ascites/diuretics/decreased oral intake.  Creatinine is improving slowly.  Encourage oral hydration.  Tolerating IV Lasix. Recent  Labs  Lab 03/03/20 1238 03/04/20 0330 03/05/20 0314  BUN 42* 44* 47*  CREATININE 1.90* 1.82* 1.66*   Cancer of pancreas, tail: CT head demonstrates a decrease in size of the mass as well as metastatic lesion in the liver.  Dr. 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  Hyponatremia sodium improving,Monitor closely. Avoid ivf 2/2 ascites.  Anemia: Noted some drop in hemoglobin likely from anemia of chronic disease/malignancy.  Monitor and transfuse if less than 7 g or symptomatic.   Recent Labs  Lab 03/03/20 1238 03/04/20 0330 03/05/20 0314  HGB 13.1 10.7* 9.7*  HCT 40.4 32.9* 29.5*   Nausea & vomiting: Tolerating diet.  Continue supportive measures.    Nutrition: Diet Order            Diet regular Room service appropriate? Yes; Fluid consistency: Thin  Diet effective now                  Body mass index is 35.12 kg/m.  DVT prophylaxis: heparin injection 5,000 Units Start: 03/03/20 1600 SCDs Start: 03/03/20 1504 Place TED hose Start: 03/03/20 1504 Code Status:   Code Status: Full Code  Family Communication: plan of care discussed with patient at bedside.  Status is: Inpatient Remains inpatient appropriate because:IV treatments appropriate due to intensity of illness or inability to take PO and Inpatient level of care appropriate due to severity of illness  Dispo: The patient is from: Home  Anticipated d/c is to: Home PT/OT               Anticipated d/c date is: 2 days              Patient currently is not medically stable to d/c. Consultants:see note  Procedures:see note  Culture/Microbiology    Component Value Date/Time   SDES  03/03/2020 1709    BLOOD LEFT  ANTECUBITAL Performed at [facility] [address], [place], [place] [address]    [place]  03/03/2020 1709    BOTTLES DRAWN AEROBIC AND ANAEROBIC Blood Culture adequate volume Performed at [facility] [address], [place], [place] [address]    CULT  03/03/2020 1709    NO GROWTH 2 DAYS Performed at [facility], [place] [address] [place], [place] [address]    REPTSTATUS PENDING 03/03/2020 1709    Other culture-see note  Medications: Scheduled Meds: . amLODipine  10 mg Oral Daily  . aspirin  81 mg Oral Daily  . atenolol  25 mg Oral Daily  . Chlorhexidine Gluconate Cloth  6 each Topical Daily  . clopidogrel  75 mg Oral Daily  . docusate sodium  100 mg Oral BID  . furosemide  20 mg Intravenous Q8H  . heparin  5,000 Units Subcutaneous Q8H  . simvastatin  20 mg Oral QHS  . sodium chloride flush  10-40 mL Intracatheter Q12H  . sodium chloride flush  3 mL Intravenous Q12H   Continuous Infusions: . sodium chloride Stopped (03/04/20 1854)  . cefTRIAXone (ROCEPHIN)  IV Stopped (03/05/20 0541)  . vancomycin Stopped (03/04/20 2247)    Antimicrobials: Anti-infectives (From admission, onward)   Start     Dose/Rate Route Frequency Ordered Stop   03/04/20 2200  vancomycin (VANCOREADY) IVPB 1250 mg/250 mL        1,250 mg 166.7 mL/hr over 90 Minutes Intravenous Every 24 hours 03/04/20 1155     03/04/20 0500  cefTRIAXone (ROCEPHIN) 2 g in sodium chloride 0.9 % 100 mL IVPB        2 g 200 mL/hr over 30 Minutes Intravenous Every 24 hours 03/03/20 1511     03/03/20 1500  vancomycin (VANCOREADY) IVPB 2000 mg/400 mL        2,000 mg 200 mL/hr over 120 Minutes Intravenous  Once 03/03/20 1447 03/03/20 2012   03/03/20 1445  ceFEPIme (MAXIPIME) 2 g in sodium chloride 0.9 % 100 mL IVPB        2 g 200 mL/hr over 30 Minutes Intravenous  Once 03/03/20 1435 03/03/20 1811     Objective: Vitals: Today's Vitals   03/04/20 1920 03/04/20 2111  03/05/20 0500 03/05/20 0553  BP:  130/70  117/73  Pulse:  74  83  Resp:  18  18  Temp:  98 F (36.7 C)  98.1 F (36.7 C)  TempSrc:  Oral  Oral  SpO2:  96%  97%  Weight:   98.7 kg   Height:      PainSc: 0-No pain       Intake/Output Summary (Last 24 hours) at 03/05/2020 0809 Last data filed at 03/05/2020 0751 Gross per 24 hour  Intake 1698.29 ml  Output 1950 ml  Net -251.71 ml   Filed Weights   03/03/20 1133 03/05/20 0500  Weight: 98.9 kg 98.7 kg   Weight change: -0.184 kg  Intake/Output from previous day: 10/07 0701 - 10/08 0700 In: 1698.3 [P.O.:960; I.V.:55.4; IV Piggyback:682.9] Out: 1550 [Urine:1550] Intake/Output this  shift: Total I/O In: -  Out: 400 [Urine:400]  Examination: General exam: AAOx3, NAD, weak appearing. HEENT:Oral mucosa moist, Ear/Nose WNL grossly, dentition normal. Respiratory system: bilaterally diminished at base,no wheezing or crackles,no use of accessory muscle Cardiovascular system: S1 & S2 +, No JVD,. Gastrointestinal system: Abdomen soft, NT,ND, BS+ Nervous System:Alert, awake, moving extremities and grossly nonfocal Extremities: b/l ankle edema, distal peripheral pulses palpable.  Skin: No rashes,no icterus. MSK: Normal muscle bulk,tone, power  Data Reviewed: I have personally reviewed following labs and imaging studies CBC: Recent Labs  Lab 03/03/20 1238 03/04/20 0330 03/05/20 0314  WBC 13.4* 9.1 6.7  NEUTROABS 9.7*  --   --   HGB 13.1 10.7* 9.7*  HCT 40.4 32.9* 29.5*  MCV 92.7 92.9 91.0  PLT 255 151 284*   Basic Metabolic Panel: Recent Labs  Lab 03/03/20 1237 03/03/20 1238 03/04/20 0330 03/05/20 0314  NA  --  131* 130* 133*  K  --  5.1 4.8 3.9  CL  --  100 100 101  CO2  --  23 22 24   GLUCOSE  --  158* 107* 102*  BUN  --  42* 44* 47*  CREATININE  --  1.90* 1.82* 1.66*  CALCIUM 8.0* 7.8* 7.6* 7.7*  MG 2.9*  --   --   --   PHOS 3.9  --   --   --    GFR: Estimated Creatinine Clearance: 46.9 mL/min (A) (by C-G  formula based on SCr of 1.66 mg/dL (H)). Liver Function Tests: Recent Labs  Lab 03/03/20 1238  AST 61*  ALT 34  ALKPHOS 167*  BILITOT 1.6*  PROT 6.3*  ALBUMIN 2.5*   No results for input(s): LIPASE, AMYLASE in the last 168 hours. No results for input(s): AMMONIA in the last 168 hours. Coagulation Profile: Recent Labs  Lab 03/03/20 1504  INR 1.3*   Cardiac Enzymes: No results for input(s): CKTOTAL, CKMB, CKMBINDEX, TROPONINI in the last 168 hours. BNP (last 3 results) No results for input(s): PROBNP in the last 8760 hours. HbA1C: Recent Labs    03/03/20 1700  HGBA1C 5.6   CBG: Recent Labs  Lab 03/04/20 0750 03/05/20 0740  GLUCAP 99 88   Lipid Profile: No results for input(s): CHOL, HDL, LDLCALC, TRIG, CHOLHDL, LDLDIRECT in the last 72 hours. Thyroid Function Tests: Recent Labs    03/03/20 1700  TSH 3.191   Anemia Panel: No results for input(s): VITAMINB12, FOLATE, FERRITIN, TIBC, IRON, RETICCTPCT in the last 72 hours. Sepsis Labs: No results for input(s): PROCALCITON, LATICACIDVEN in the last 168 hours.  Recent Results (from the past 240 hour(s))  Respiratory Panel by RT PCR (Flu A&B, Covid) - Nasopharyngeal Swab     Status: None   Collection Time: 03/03/20  2:18 PM   Specimen: Nasopharyngeal Swab  Result Value Ref Range Status   SARS Coronavirus 2 by RT PCR NEGATIVE NEGATIVE Final    Comment: (NOTE) SARS-CoV-2 target nucleic acids are NOT DETECTED.  The SARS-CoV-2 RNA is generally detectable in upper respiratoy specimens during the acute phase of infection. The lowest concentration of SARS-CoV-2 viral copies this assay can detect is 131 copies/mL. A negative result does not preclude SARS-Cov-2 infection and should not be used as the sole basis for treatment or other patient management decisions. A negative result may occur with  improper specimen collection/handling, submission of specimen other than nasopharyngeal swab, presence of viral mutation(s)  within the areas targeted by this assay, and inadequate number of viral copies (<131 copies/mL).  A negative result must be combined with clinical observations, patient history, and epidemiological information. The expected result is Negative.  Fact Sheet for Patients:  PinkCheek.be  Fact Sheet for Healthcare Providers:  GravelBags.it  This test is no t yet approved or cleared by the [place] FDA and  has been authorized for detection and/or diagnosis of SARS-CoV-2 by FDA under an Emergency Use Authorization (EUA). This EUA will remain  in effect (meaning this test can be used) for the duration of the COVID-19 declaration under Section 564(b)(1) of the Act, 21 U.S.C. section 360bbb-3(b)(1), unless the authorization is terminated or revoked sooner.     Influenza A by PCR NEGATIVE NEGATIVE Final   Influenza B by PCR NEGATIVE NEGATIVE Final    Comment: (NOTE) The Xpert Xpress SARS-CoV-2/FLU/RSV assay is intended as an aid in  the diagnosis of influenza from Nasopharyngeal swab specimens and  should not be used as a sole basis for treatment. Nasal washings and  aspirates are unacceptable for Xpert Xpress SARS-CoV-2/FLU/RSV  testing.  Fact Sheet for Patients: PinkCheek.be  Fact Sheet for Healthcare Providers: GravelBags.it  This test is not yet approved or cleared by the [place] FDA and  has been authorized for detection and/or diagnosis of SARS-CoV-2 by  FDA under an Emergency Use Authorization (EUA). This EUA will remain  in effect (meaning this test can be used) for the duration of the  Covid-19 declaration under Section 564(b)(1) of the Act, 21  U.S.C. section 360bbb-3(b)(1), unless the authorization is  terminated or revoked. Performed at [facility], [place] [address], [place], [place] [address]   Body fluid culture     Status:  None (Preliminary result)   Collection Time: 03/03/20  4:38 PM   Specimen: PATH Cytology Peritoneal fluid  Result Value Ref Range Status   Specimen Description PERITONEAL  Final   Special Requests NONE  Final   Gram Stain   Final    WBC PRESENT, PREDOMINANTLY MONONUCLEAR NO ORGANISMS SEEN CYTOSPIN SMEAR Gram Stain Report Called to,Read Back By and Verified With: C.SIMPSON AT 1848 ON 03/03/20 BY N.THOMPSON Performed at [facility], [place] [address] [place], [place] [address]    Culture PENDING  Incomplete   Report Status PENDING  Incomplete  Aerobic Culture (superficial specimen)     Status: None (Preliminary result)   Collection Time: 03/03/20  4:38 PM   Specimen: PATH Cytology Peritoneal fluid  Result Value Ref Range Status   Specimen Description   Final    PERITONEAL Performed at [facility] [address], [place], [place] [address]    Special Requests   Final    NONE Performed at [facility], [place] [address] [place], [place] [address]    Gram Stain PENDING  Incomplete   Culture   Final    NO GROWTH < 24 HOURS Performed at [facility], [place] [address], [place], [place] [address]    Report Status PENDING  Incomplete  Blood culture (routine x 2)     Status: None (Preliminary result)   Collection Time: 03/03/20  4:49 PM   Specimen: BLOOD  Result Value Ref Range Status   Specimen Description   Final    BLOOD PORTA CATH RIGHT CHEST Performed at [facility] [address], [place], [place] [address]    Special Requests   Final    BOTTLES DRAWN AEROBIC AND ANAEROBIC Blood Culture adequate volume Performed at [facility],  [place] [address], [place], [place] [address]    Culture  Setup Time   Final    GRAM POSITIVE COCCI IN CLUSTERS IN BOTH AEROBIC AND ANAEROBIC BOTTLES CRITICAL RESULT CALLED TO, READ BACK BY AND VERIFIED WITH: PHARMD C.SHADE AT 1100 ON 03/04/2020 BY  T.SAAD Performed at [facility], [place] [address] [place], [place] [address]    Culture GRAM POSITIVE COCCI  Final   Report Status PENDING  Incomplete  Blood Culture ID Panel (Reflexed)     Status: Abnormal   Collection Time: 03/03/20  4:49 PM  Result Value Ref Range Status   Enterococcus faecalis NOT DETECTED NOT DETECTED Final   Enterococcus Faecium NOT DETECTED NOT DETECTED Final   Listeria monocytogenes NOT DETECTED NOT DETECTED Final   Staphylococcus species DETECTED (A) NOT DETECTED Final    Comment: CRITICAL RESULT CALLED TO, READ BACK BY AND VERIFIED WITH: PHARMD C.SHADE AT 1100 ON 03/04/2020 BY T.SAAD    Staphylococcus aureus (BCID) NOT DETECTED NOT DETECTED Final   Staphylococcus epidermidis DETECTED (A) NOT DETECTED Final    Comment: Methicillin (oxacillin) resistant coagulase negative staphylococcus. Possible blood culture contaminant (unless isolated from more than one blood culture draw or clinical case suggests pathogenicity). No antibiotic treatment is indicated for blood  culture contaminants. CRITICAL RESULT CALLED TO, READ BACK BY AND VERIFIED WITH: PHARMD C.SHADE AT 1100 ON 03/04/2020 BY T.SAAD    Staphylococcus lugdunensis NOT DETECTED NOT DETECTED Final   Streptococcus species NOT DETECTED NOT DETECTED Final   Streptococcus agalactiae NOT DETECTED NOT DETECTED Final   Streptococcus pneumoniae NOT DETECTED NOT DETECTED Final   Streptococcus pyogenes NOT DETECTED NOT DETECTED Final   A.calcoaceticus-baumannii NOT DETECTED NOT DETECTED Final   Bacteroides fragilis NOT DETECTED NOT DETECTED Final   Enterobacterales NOT DETECTED NOT DETECTED Final   Enterobacter cloacae complex NOT DETECTED NOT DETECTED Final   Escherichia coli NOT DETECTED NOT DETECTED Final   Klebsiella aerogenes NOT DETECTED NOT DETECTED Final   Klebsiella oxytoca NOT DETECTED NOT DETECTED Final   Klebsiella pneumoniae NOT DETECTED NOT DETECTED Final   Proteus species NOT DETECTED NOT  DETECTED Final   Salmonella species NOT DETECTED NOT DETECTED Final   Serratia marcescens NOT DETECTED NOT DETECTED Final   Haemophilus influenzae NOT DETECTED NOT DETECTED Final   Neisseria meningitidis NOT DETECTED NOT DETECTED Final   Pseudomonas aeruginosa NOT DETECTED NOT DETECTED Final   Stenotrophomonas maltophilia NOT DETECTED NOT DETECTED Final   Candida albicans NOT DETECTED NOT DETECTED Final   Candida auris NOT DETECTED NOT DETECTED Final   Candida glabrata NOT DETECTED NOT DETECTED Final   Candida krusei NOT DETECTED NOT DETECTED Final   Candida parapsilosis NOT DETECTED NOT DETECTED Final   Candida tropicalis NOT DETECTED NOT DETECTED Final   Cryptococcus neoformans/gattii NOT DETECTED NOT DETECTED Final   Methicillin resistance mecA/C DETECTED (A) NOT DETECTED Final    Comment: CRITICAL RESULT CALLED TO, READ BACK BY AND VERIFIED WITH: PHARMD C.SHADE AT 1100 ON 03/04/2020 BY T.SAAD Performed at [facility], [place] [address], [place], [place] [address]   Blood culture (routine x 2)     Status: None (Preliminary result)   Collection Time: 03/03/20  5:09 PM   Specimen: BLOOD  Result Value Ref Range Status   Specimen Description   Final    BLOOD LEFT ANTECUBITAL Performed at [facility] [address], [place], [place] [address]    Special Requests   Final    BOTTLES DRAWN  AEROBIC AND ANAEROBIC Blood Culture adequate volume Performed at [facility] [address], [place], [place] [address]    Culture   Final    NO GROWTH 2 DAYS Performed at [facility] [address] [place],  [address]    Report Status PENDING  Incomplete     Radiology Studies: DG Chest 2 View  Result Date: 03/03/2020 CLINICAL DATA:  Weakness. EXAM: CHEST - 2 VIEW COMPARISON:  September 20, 2018. FINDINGS: Stable cardiomediastinal silhouette. Right internal jugular Port-A-Cath is unchanged in position. No pneumothorax is noted. Left lung  is clear. Elevated right hemidiaphragm is noted with mild right basilar subsegmental atelectasis or infiltrate. No significant pleural effusion is noted. Bony thorax is unremarkable. IMPRESSION: Elevated right hemidiaphragm with mild right basilar subsegmental atelectasis or infiltrate. Electronically Signed   By: Marijo Conception M.D.   On: 03/03/2020 12:30   CT Head Wo Contrast  Result Date: 03/03/2020 CLINICAL DATA:  Slurred speech, generalized weakness EXAM: CT HEAD WITHOUT CONTRAST TECHNIQUE: Contiguous axial images were obtained from the base of the skull through the vertex without intravenous contrast. COMPARISON:  MRI 02/15/2011 FINDINGS: Brain: No evidence of acute infarction, hemorrhage, hydrocephalus, extra-axial collection or mass lesion/mass effect. Encephalomalacia within the posterior left temporoparietal region at site of previously seen infarct. Vascular: Atherosclerotic calcifications involving the large vessels of the skull base. No unexpected hyperdense vessel. Skull: Normal. Negative for fracture or focal lesion. Sinuses/Orbits: No acute finding. Other: None. IMPRESSION: No acute intracranial findings. Electronically Signed   By: Davina Poke D.O.   On: 03/03/2020 14:28   CT ABDOMEN PELVIS W CONTRAST  Result Date: 03/03/2020 CLINICAL DATA:  Abdominal distension. History of metastatic pancreas cancer. EXAM: CT ABDOMEN AND PELVIS WITH CONTRAST TECHNIQUE: Multidetector CT imaging of the abdomen and pelvis was performed using the standard protocol following bolus administration of intravenous contrast. CONTRAST:  17mL OMNIPAQUE IOHEXOL 300 MG/ML  SOLN COMPARISON:  12/25/2019 FINDINGS: Lower chest: Pleuroparenchymal scarring is again noted within the right lung. No substantial change in the appearance of tiny bilateral lung nodules within the imaged portions of the lower lung zones. No acute abnormality identified. Hepatobiliary: Previously characterized subcapsular metastatic lesion is  not confidently seen on today's study. No new focal liver abnormality. Within the right hepatic lobe gallbladder unremarkable. No biliary ductal dilatation. Pancreas: No signs of pancreatic inflammation or main duct dilatation. The index lesion within distal tail of pancreas measures 2.0 x 1.6 cm, image 36/2. Previously 3.0 x 1.6 cm. Spleen: Normal in size without focal abnormality. Adrenals/Urinary Tract: Normal appearance of the adrenal glands. No kidney mass or hydronephrosis identified. Similar appearance of scarring and volume loss from the inferior pole cortex of the left kidney. Urinary bladder appears normal. Stomach/Bowel: Stomach is nondistended. No bowel wall thickening, inflammation, or distension identified. Left lower quadrant colostomy identified. 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 Vascular/Lymphatic: Aortic atherosclerosis. No aneurysm. The portal vein is patent. 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 No abdominopelvic adenopathy. Reproductive: Prostate is unremarkable. Other: Interval development of large volume of ascites within the abdomen and pelvis. No discrete peritoneal nodule. Musculoskeletal: No acute or significant osseous findings. IMPRESSION: 1. Interval development of large volume of ascites within the abdomen and pelvis. No discrete peritoneal nodule identified. Etiology is indeterminate. Differential considerations include (but not limited to): Portal venous hypertension, CHF, or peritoneal metastasis. Diagnostic and therapeutic paracentesis may be helpful for further investigation. 2. Decrease in size of distal tail  of pancreas lesion. 3. Previously characterized subcapsular metastatic lesion within the liver is not confidently seen on today's study. No new focal liver abnormality identified. 4. Similar appearance of tiny bilateral lung nodules within the imaged portions of the lower lung zones. 5. Aortic atherosclerosis. Aortic  Atherosclerosis (ICD10-I70.0). Electronically Signed   By: Kerby Moors M.D.   On: 03/03/2020 14:37   US Paracentesis  Result Date: 03/03/2020 INDICATION: Patient with a history of colon cancer presents with new onset ascites. Interventional radiology asked to perform a therapeutic and diagnostic paracentesis. EXAM: ULTRASOUND GUIDED PARACENTESIS MEDICATIONS: 1% lidocaine 10 mL COMPLICATIONS: None immediate. PROCEDURE: Informed written consent was obtained from the patient after a discussion of the risks, benefits and alternatives to treatment. A timeout was performed prior to the initiation of the procedure. Initial ultrasound scanning demonstrates a large amount of ascites within the right lower abdominal quadrant. The right lower abdomen was prepped and draped in the usual sterile fashion. 1% lidocaine was used for local anesthesia. Following this, a 19 gauge, 7-cm, Yueh catheter was introduced. An ultrasound image was saved for documentation purposes. The paracentesis was performed. The catheter was removed and a dressing was applied. The patient tolerated the procedure well without immediate post procedural complication. FINDINGS: A total of approximately 5 L of clear fluid was removed. Samples were sent to the laboratory as requested by the clinical team. IMPRESSION: Successful ultrasound-guided paracentesis yielding 5 liters of peritoneal fluid. Read by: Soyla Dryer, NP Electronically Signed   By: Lucrezia Europe M.D.   On: 03/03/2020 16:31     LOS: 2 days   Antonieta Pert, MD [facility]  03/05/2020, 8:09 AM

## 2020-03-05 NOTE — Telephone Encounter (Signed)
Alex [name], patient is currently at [facility] and he will most likely be discharged home within the next 24 hours. Pls contact the patient on Monday 10/11  and send him to our lab to have a BMP on Thurs 10/14 and schedule him for a  follow up in the office in 2 weeks. Dr. Hilarie Fredrickson is his primary GI. Appointment can be with me or Dr. Hilarie Fredrickson.  thx

## 2020-03-05 NOTE — Care Management Important Message (Signed)
Important Message  Patient Details IM Letter given to the Patient Name: Alex [name] MRN: [id number] Date of Birth: 02/03/1951   Medicare Important Message Given:  Yes     Kerin Salen 03/05/2020, 12:05 PM

## 2020-03-05 NOTE — Progress Notes (Signed)
Physical Therapy Treatment Patient Details Name: Alex [name] MRN: [id number] DOB: [birth year] Today's Date: 03/05/2020    History of Present Illness Pt admitted with weakness and abdominal distention 2* Ascites in the setting of Pancreatic CA with liver mets.  Pt with hx of partial colectomy, colostomy and CVA (2010)  - pt states largely affected speech     PT Comments    Pt eager to mobilize and with noted improved stability but continued limited endurance.   Follow Up Recommendations  Home health PT     Equipment Recommendations  Rolling walker with 5" wheels    Recommendations for Other Services       Precautions / Restrictions Precautions Precautions: Fall Restrictions Weight Bearing Restrictions: No    Mobility  Bed Mobility Overal bed mobility: Needs Assistance Bed Mobility: Supine to Sit;Sit to Supine     Supine to sit: Supervision Sit to supine: Min guard   General bed mobility comments: use of bed rail to move to sitting but no physical assist; min guard to bring LEs up onto bed on return  Transfers Overall transfer level: Needs assistance Equipment used: Rolling walker (2 wheeled) Transfers: Sit to/from Stand Sit to Stand: Min guard         General transfer comment: cues for use of UEs to self assist  Ambulation/Gait Ambulation/Gait assistance: Min assist Gait Distance (Feet): 220 Feet Assistive device: Rolling walker (2 wheeled);1 person hand held assist Gait Pattern/deviations: Step-to pattern;Step-through pattern;Decreased step length - right;Decreased step length - left;Shuffle;Trunk flexed;Wide base of support Gait velocity: decr   General Gait Details: cues for posture and position from RW; improved stability noted from yesterday   Stairs             Wheelchair Mobility    Modified Rankin (Stroke Patients Only)       Balance Overall balance assessment: Independent Sitting-balance support: No upper extremity  supported;Feet supported Sitting balance-Leahy Scale: Good     Standing balance support: Bilateral upper extremity supported Standing balance-Leahy Scale: Poor                              Cognition Arousal/Alertness: Awake/alert Behavior During Therapy: Flat affect Overall Cognitive Status: Within Functional Limits for tasks assessed                                        Exercises      General Comments        Pertinent Vitals/Pain Pain Assessment: No/denies pain    Home Living                      Prior Function            PT Goals (current goals can now be found in the care plan section) Acute Rehab PT Goals Patient Stated Goal: Regain IND  PT Goal Formulation: With patient Time For Goal Achievement: 03/18/20 Potential to Achieve Goals: Good Progress towards PT goals: Progressing toward goals    Frequency    Min 3X/week      PT Plan Current plan remains appropriate    Co-evaluation              AM-PAC PT "6 Clicks" Mobility   Outcome Measure  Help needed turning from your back to your side while in a flat bed without  using bedrails?: None Help needed moving from lying on your back to sitting on the side of a flat bed without using bedrails?: A Little Help needed moving to and from a bed to a chair (including a wheelchair)?: A Little Help needed standing up from a chair using your arms (e.g., wheelchair or bedside chair)?: A Little Help needed to walk in hospital room?: A Little Help needed climbing 3-5 steps with a railing? : A Little 6 Click Score: 19    End of Session Equipment Utilized During Treatment: Gait belt Activity Tolerance: Patient tolerated treatment well Patient left: in bed;with call bell/phone within reach Nurse Communication: Mobility status PT Visit Diagnosis: Unsteadiness on feet (R26.81);Muscle weakness (generalized) (M62.81);Difficulty in walking, not elsewhere classified (R26.2)      Time: 5597-4163 PT Time Calculation (min) (ACUTE ONLY): 22 min  Charges:  $Gait Training: 8-22 mins                     [place] Pager [phone] Office [phone]    Makale Pindell 03/05/2020, 3:54 PM

## 2020-03-05 NOTE — Consult Note (Addendum)
[facility]ious Disease  Total days of antibiotics 3               Reason for Consult: bacteremia    Referring Physician: Maren Beach  Principal Problem:   Ascites Active Problems:   History of stroke   Anemia   Benign essential HTN   Abdominal distension   Nausea & vomiting   Nausea and vomiting   Cancer of pancreas, tail (HCC)    HPI: Alex [name] is a 69 y.o. male with hx of metastatic pancreatic cancer on chemotherapy of gemcitabine/abraxane and follows with dr Benay Spice. He was admitted on 10/6 for feeling progressively weaker with unsteady gait, and sustained a fall at home where he suffered skin tissue tear to right fore arm, but in addition to diffuse weakness he has had increased abdominal girth. On admit, his wbc is 9/1, blood cx from portacath showed 2 species of staph (MRSE) and peripheral blood cx ngtd. He was started on vancomycin plus ceftriaxone. He also did undergo 5L  paracentesis. No growth on cx at 48 hrs. Had 100 PMNS in ascites fluid. ID consulted to whether portacath needs to be pulled. He feels slightly better, less swelling to his abdomen but still not at his baseline. He states that his legs are also swollen nontender  Past Medical History:  Diagnosis Date  . Abdominal distension 09/14/2017  . 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 Anemia 08/26/2017  . Aortic atherosclerosis ([place]) 08/27/2017  . Atherosclerosis of arteries   . Carotid artery occlusion   . Colon obstruction ([place])   . Colon polyps   . Community acquired pneumonia of right lower lobe of lung 08/04/2017  . Dehydration   . Empyema ([place]) 08/27/2017  . Empyema of right pleural space ([place]) 08/30/2017  . High cholesterol   . History of stroke 08/04/2017  . Hypertension   . Hypokalemia 09/14/2017  . Hyponatremia 08/26/2017  . Large bowel obstruction ([place]) 09/13/2017  . Lobar pneumonia ([place]) 08/27/2017  . Malignant tumor of sigmoid colon  ([place])   . Nausea & vomiting 09/14/2017  . Nausea and vomiting 09/14/2017  . Pleural effusion 08/25/2017  . Pleural effusion, right 08/27/2017  . Pneumonia   . Pulmonary nodule, right 08/27/2017  . Stroke [facility]) 2010   denies residual on 09/18/2017    Allergies: No Known Allergies    MEDICATIONS: . amLODipine  10 mg Oral Daily  . aspirin  81 mg Oral Daily  . atenolol  25 mg Oral Daily  . Chlorhexidine Gluconate Cloth  6 each Topical Daily  . clopidogrel  75 mg Oral Daily  . docusate sodium  100 mg Oral BID  . furosemide  20 mg Intravenous Q8H  . heparin  5,000 Units Subcutaneous Q8H  . simvastatin  20 mg Oral QHS  . sodium chloride flush  10-40 mL Intracatheter Q12H  . sodium chloride flush  3 mL Intravenous Q12H  . spironolactone  50 mg Oral Daily    Social History   Tobacco Use  . Smoking status: Never Smoker  . Smokeless tobacco: Never Used  Vaping Use  . Vaping Use: Never used  Substance Use Topics  . Alcohol use: Not Currently    Alcohol/week: 0.0 standard drinks    Comment: 09/18/2017 "nothing since the 1990s"  . Drug use: Yes    Types: Marijuana    Comment: 09/18/2017 "nothing since the 1980s"    Family History  Problem Relation Age of Onset  . Heart disease Other   . Hypertension Father   . Stroke Father   . Heart disease Father   . Diabetes Sister   . Diabetes Brother   . Head & neck cancer Brother      Review of Systems  Constitutional: +fatigue and weakness. Negative for fever, chills, diaphoresis, activity change, appetite change, fatigue and unexpected weight change.  HENT: Negative for congestion, sore throat, rhinorrhea, sneezing, trouble swallowing and sinus pressure.  Eyes: Negative for photophobia and visual disturbance.  Respiratory: Negative for cough, chest tightness, shortness of breath, wheezing and stridor.  Cardiovascular: Negative for chest pain, palpitations and leg swelling.  Gastrointestinal: +abdominal girth, Negative for nausea,  vomiting, abdominal pain, diarrhea, constipation, blood in stool, abdominal distention and anal bleeding.  Genitourinary: Negative for dysuria, hematuria, flank pain and difficulty urinating.  Musculoskeletal: Negative for myalgias, back pain, joint swelling, arthralgias and gait problem.  Skin: Negative for color change, pallor, rash and wound.  Neurological: Negative for dizziness, tremors, weakness and light-headedness.  Hematological: Negative for adenopathy. Does not bruise/bleed easily.  Psychiatric/Behavioral: Negative for behavioral problems, confusion, sleep disturbance, dysphoric mood, decreased concentration and agitation.     OBJECTIVE: Temp:  [98 F (36.7 C)-98.2 F (36.8 C)] 98.2 F (36.8 C) (10/08 1405) Pulse Rate:  [74-83] 75 (10/08 1405) Resp:  [18] 18 (10/08 1405) BP: (117-131)/(70-76) 131/76 (10/08 1405) SpO2:  [96 %-97 %] 97 % (10/08 1405) Weight:  [98.7 kg] 98.7 kg (10/08 0500) Physical Exam  Constitutional: He is oriented to person, place, and time. He appears well-developed and well-nourished. No distress.  HENT:  Mouth/Throat: Oropharynx is clear and moist. No oropharyngeal exudate.  Cardiovascular: Normal rate, regular rhythm and normal heart sounds. Exam reveals no gallop and no friction rub.  No murmur heard.  Chest wall = portacath is c/d/i Pulmonary/Chest: Effort normal and breath sounds normal. No respiratory distress. He has no wheezes.  Abdominal: Soft. Bowel sounds are normal. + distension. There is no tenderness.  Lymphadenopathy:  He has no cervical adenopathy.  Ext: right arm skin tear -wrapped Neurological: He is alert and oriented to person, place, and time.  Skin: Skin is warm and dry. No rash noted. No erythema.  Psychiatric: He has a normal mood and affect. His behavior is normal.     LABS: Results for orders placed or performed during the hospital encounter of 03/03/20 (from the past 48 hour(s))  Lactate dehydrogenase (pleural or  peritoneal fluid)     Status: Abnormal   Collection Time: 03/03/20  4:38 PM  Result Value Ref Range   LD, Fluid 29 (H) 3 - 23 U/L    Comment: (NOTE) Results should be evaluated in conjunction with serum values    Fluid Type-FLDH CYTO PERI     Comment: Performed at [facility], [place] [address], [place], [place] [address]  Body fluid cell count with differential     Status: Abnormal   Collection Time: 03/03/20  4:38 PM  Result Value Ref Range   Fluid Type-FCT CYTO PERI    Color, Fluid STRAW (A) YELLOW   Appearance, Fluid HAZY (A) CLEAR   Total Nucleated Cell Count, Fluid 125 0 - 1,000 cu mm   Neutrophil Count, Fluid 6 0 - 25 %   Lymphs, Fluid 6 %   Monocyte-Macrophage-Serous Fluid 88 50 - 90 %   Eos, Fluid 0 %   Other Cells, Fluid CORRELATE WITH CYTOLOGY. %    Comment: Performed  at [facility], [place] [address] [place], [place] [address]  Body fluid culture     Status: None (Preliminary result)   Collection Time: 03/03/20  4:38 PM   Specimen: PATH Cytology Peritoneal fluid  Result Value Ref Range   Specimen Description      PERITONEAL Performed at [place] [address], [place], [place] [address]    Special Requests      NONE Performed at [facility], [place] [address] [place], [place] [address]    Gram Stain      WBC PRESENT, PREDOMINANTLY MONONUCLEAR NO ORGANISMS SEEN CYTOSPIN SMEAR Gram Stain Report Called to,Read Back By and Verified With: C.SIMPSON AT 1848 ON 03/03/20 BY N.THOMPSON Performed at [facility], [place] [address], [place], [place] [address]    Culture      NO GROWTH 1 DAY Performed at [place] [address] [place], [place] [address]    Report Status PENDING   Amylase, pleural or peritoneal fluid     Status: None   Collection Time: 03/03/20  4:38 PM  Result Value Ref Range   Amylase, Fluid 30 U/L    Comment: NO NORMAL RANGE ESTABLISHED FOR  THIS TEST Performed at [facility], [address] [place], [place] [address]    Fluid Type-FAMY CYTO PERI     Comment: Performed at [facility], [place] [address], [place], [place] [address]  Albumin, pleural or peritoneal fluid     Status: None   Collection Time: 03/03/20  4:38 PM  Result Value Ref Range   Albumin, Fluid <1.0 g/dL    Comment: (NOTE) No normal range established for this test Results should be evaluated in conjunction with serum values    Fluid Type-FALB CYTO PERI     Comment: Performed at [facility], [place] [address], [place], [place] [address]  Protein, pleural or peritoneal fluid     Status: None   Collection Time: 03/03/20  4:38 PM  Result Value Ref Range   Total protein, fluid <3.0 g/dL    Comment: (NOTE) No normal range established for this test Results should be evaluated in conjunction with serum values    Fluid Type-FTP CYTO PERI     Comment: Performed at [facility], [place] [address], [place],  [address]  Glucose, pleural or peritoneal fluid     Status: None   Collection Time: 03/03/20  4:38 PM  Result Value Ref Range   Glucose, Fluid 142 mg/dL    Comment: (NOTE) No normal range established for this test Results should be evaluated in conjunction with serum values    Fluid Type-FGLU CYTO PERI     Comment: Performed at [facility], [place] [address] [place],  [address]  Aerobic Culture (superficial specimen)     Status: None (Preliminary result)   Collection Time: 03/03/20  4:38 PM   Specimen: PATH Cytology Peritoneal fluid  Result Value Ref Range   Specimen Description      PERITONEAL Performed at [place] [address], [place],  [address]    Special Requests      NONE Performed at [facility], [place] [address], [place], [place] [address]    Gram Stain NO WBC SEEN NO ORGANISMS SEEN      Culture      NO GROWTH 2 DAYS Performed at [facility], [place]  [address], [place], [place] [address]    Report Status PENDING   Cytology - Non PAP;     Status: None   Collection Time: 03/03/20  4:38 PM  Result Value Ref Range   CYTOLOGY - NON GYN      CYTOLOGY - NON PAP CASE: [id number] PATIENT: Cleophas Dunker Non-Gynecological Cytology Report     Clinical History: 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 ASCITES, PARACENTESIS:   FINAL MICROSCOPIC DIAGNOSIS: - No malignant cells identified - Reactive mesothelial cells present  SPECIMEN ADEQUACY: Satisfactory for evaluation  GROSS: Received is/are 1000 ccs of cloudy light yellow fluid. (CM:cm) Prepared: Smears:  0 Concentration method (Thin Prep):  1 Cell block:  1 Additional studies:  Also included is 1 Hematology slide labeled [id number].      Final Diagnosis performed by Thressa Sheller, MD.   Electronically signed 03/05/2020 Technical component performed at [facility], [place] [address], [place], [place] [address].  Professional component performed at [employer], [place] [address], [place], [place] [address].  Immunohistochemistry Technical component (if applicable) was performed at  [facility]. [address] [place], [place], [place] [address].   IMMUNOHISTOCHEMISTRY DISCLAIMER (if applicable): Some of these immunohistochemical stains may have been developed and the performance characteristics determine by [facility]. Some may not have been cleared or approved by the U.S. Food and Drug Administration. The FDA has determined that such clearance or approval is not necessary. This test is used for clinical purposes. It should not be regarded as investigational or for research. This laboratory is certified under the [place] (CLIA-88) as qualified to perform high  complexity clinical laboratory testing.  The controls stained appropriately.   Blood culture (routine x 2)     Status: Abnormal (Preliminary result)   Collection Time: 03/03/20  4:49 PM   Specimen: BLOOD  Result Value Ref Range   Specimen Description      BLOOD PORTA CATH RIGHT CHEST Performed at [place] [address] [place], [place] [address]    Special Requests      BOTTLES DRAWN AEROBIC AND ANAEROBIC Blood Culture adequate volume Performed at [place] [address] [place], [place] [address]    Culture  Setup Time      GRAM POSITIVE COCCI IN CLUSTERS IN BOTH AEROBIC AND ANAEROBIC BOTTLES CRITICAL RESULT CALLED TO, READ BACK BY AND VERIFIED WITH: PHARMD C.SHADE AT 1100 ON 03/04/2020 BY T.SAAD    Culture (A)     STAPHYLOCOCCUS HOMINIS STAPHYLOCOCCUS EPIDERMIDIS THE SIGNIFICANCE OF ISOLATING THIS ORGANISM FROM A SINGLE SET OF BLOOD CULTURES WHEN MULTIPLE SETS ARE DRAWN IS UNCERTAIN. PLEASE NOTIFY THE MICROBIOLOGY DEPARTMENT WITHIN ONE WEEK IF SPECIATION AND SENSITIVITIES ARE REQUIRED. Performed at [facility], [place] [address], [place], [place] [address]    Report Status PENDING   Blood Culture ID Panel (Reflexed)     Status: Abnormal   Collection Time: 03/03/20  4:49 PM  Result Value Ref Range   Enterococcus faecalis NOT DETECTED NOT DETECTED   Enterococcus Faecium NOT DETECTED NOT DETECTED   Listeria monocytogenes NOT DETECTED NOT DETECTED   Staphylococcus species DETECTED (A) NOT DETECTED    Comment: CRITICAL RESULT CALLED TO, READ BACK BY AND VERIFIED WITH: PHARMD C.SHADE AT 1100 ON 03/04/2020 BY T.SAAD    Staphylococcus aureus (BCID) NOT DETECTED NOT DETECTED   Staphylococcus epidermidis DETECTED (A) NOT DETECTED  Comment: Methicillin (oxacillin) resistant coagulase negative staphylococcus. Possible blood culture contaminant (unless isolated from more than one blood culture draw or clinical case suggests  pathogenicity). No antibiotic treatment is indicated for blood  culture contaminants. CRITICAL RESULT CALLED TO, READ BACK BY AND VERIFIED WITH: PHARMD C.SHADE AT 1100 ON 03/04/2020 BY T.SAAD    Staphylococcus lugdunensis NOT DETECTED NOT DETECTED   Streptococcus species NOT DETECTED NOT DETECTED   Streptococcus agalactiae NOT DETECTED NOT DETECTED   Streptococcus pneumoniae NOT DETECTED NOT DETECTED   Streptococcus pyogenes NOT DETECTED NOT DETECTED   A.calcoaceticus-baumannii NOT DETECTED NOT DETECTED   Bacteroides fragilis NOT DETECTED NOT DETECTED   Enterobacterales NOT DETECTED NOT DETECTED   Enterobacter cloacae complex NOT DETECTED NOT DETECTED   Escherichia coli NOT DETECTED NOT DETECTED   Klebsiella aerogenes NOT DETECTED NOT DETECTED   Klebsiella oxytoca NOT DETECTED NOT DETECTED   Klebsiella pneumoniae NOT DETECTED NOT DETECTED   Proteus species NOT DETECTED NOT DETECTED   Salmonella species NOT DETECTED NOT DETECTED   Serratia marcescens NOT DETECTED NOT DETECTED   Haemophilus influenzae NOT DETECTED NOT DETECTED   Neisseria meningitidis NOT DETECTED NOT DETECTED   Pseudomonas aeruginosa NOT DETECTED NOT DETECTED   Stenotrophomonas maltophilia NOT DETECTED NOT DETECTED   Candida albicans NOT DETECTED NOT DETECTED   Candida auris NOT DETECTED NOT DETECTED   Candida glabrata NOT DETECTED NOT DETECTED   Candida krusei NOT DETECTED NOT DETECTED   Candida parapsilosis NOT DETECTED NOT DETECTED   Candida tropicalis NOT DETECTED NOT DETECTED   Cryptococcus neoformans/gattii NOT DETECTED NOT DETECTED   Methicillin resistance mecA/C DETECTED (A) NOT DETECTED    Comment: CRITICAL RESULT CALLED TO, READ BACK BY AND VERIFIED WITH: PHARMD C.SHADE AT 1100 ON 03/04/2020 BY T.SAAD Performed at [facility], [place] [address], [place], [place] [address]   Hemoglobin A1c     Status: None   Collection Time: 03/03/20  5:00 PM  Result Value Ref Range   Hgb A1c MFr Bld 5.6 4.8 - 5.6  %    Comment: (NOTE) Pre diabetes:          5.7%-6.4%  Diabetes:              >6.4%  Glycemic control for   <7.0% adults with diabetes    Mean Plasma Glucose 114.02 mg/dL    Comment: Performed at [place] [address] [place], [place] [address]  TSH     Status: None   Collection Time: 03/03/20  5:00 PM  Result Value Ref Range   TSH 3.191 0.350 - 4.500 uIU/mL    Comment: Performed by a 3rd Generation assay with a functional sensitivity of <=0.01 uIU/mL. Performed at [facility], [place] [address], [place], [place] [address]   Blood culture (routine x 2)     Status: None (Preliminary result)   Collection Time: 03/03/20  5:09 PM   Specimen: BLOOD  Result Value Ref Range   Specimen Description      BLOOD LEFT ANTECUBITAL Performed at [facility], [place] [address] [place], [place] [address]    Special Requests      BOTTLES DRAWN AEROBIC AND ANAEROBIC Blood Culture adequate volume Performed at [place] [address] [place], [place] [address]    Culture      NO GROWTH 2 DAYS Performed at [facility], [place] [address] [place], [place] [address]    Report Status PENDING   Basic  metabolic panel     Status: Abnormal   Collection Time: 03/04/20  3:30 AM  Result Value Ref Range   Sodium 130 (L) 135 - 145 mmol/L   Potassium 4.8 3.5 - 5.1 mmol/L   Chloride 100 98 - 111 mmol/L   CO2 22 22 - 32 mmol/L   Glucose, Bld 107 (H) 70 - 99 mg/dL    Comment: Glucose reference range applies only to samples taken after fasting for at least 8 hours.   BUN 44 (H) 8 - 23 mg/dL   Creatinine, Ser 1.82 (H) 0.61 - 1.24 mg/dL   Calcium 7.6 (L) 8.9 - 10.3 mg/dL   GFR calc non Af Amer 37 (L) >60 mL/min   Anion gap 8 5 - 15    Comment: Performed at [facility], [place] [address] [place], [place] [address]  CBC     Status: Abnormal   Collection Time: 03/04/20  3:30 AM  Result Value Ref Range   WBC 9.1  4.0 - 10.5 K/uL   RBC 3.54 (L) 4.22 - 5.81 MIL/uL   Hemoglobin 10.7 (L) 13.0 - 17.0 g/dL   HCT 32.9 (L) 39 - 52 %   MCV 92.9 80.0 - 100.0 fL   MCH 30.2 26.0 - 34.0 pg   MCHC 32.5 30.0 - 36.0 g/dL   RDW 19.9 (H) 11.5 - 15.5 %   Platelets 151 150 - 400 K/uL   nRBC 0.0 0.0 - 0.2 %    Comment: Performed at [facility], [place] [address], [place], [place] [address]  Glucose, capillary     Status: None   Collection Time: 03/04/20  7:50 AM  Result Value Ref Range   Glucose-Capillary 99 70 - 99 mg/dL    Comment: Glucose reference range applies only to samples taken after fasting for at least 8 hours.  Basic metabolic panel     Status: Abnormal   Collection Time: 03/05/20  3:14 AM  Result Value Ref Range   Sodium 133 (L) 135 - 145 mmol/L   Potassium 3.9 3.5 - 5.1 mmol/L    Comment: DELTA CHECK NOTED   Chloride 101 98 - 111 mmol/L   CO2 24 22 - 32 mmol/L   Glucose, Bld 102 (H) 70 - 99 mg/dL    Comment: Glucose reference range applies only to samples taken after fasting for at least 8 hours.   BUN 47 (H) 8 - 23 mg/dL   Creatinine, Ser 1.66 (H) 0.61 - 1.24 mg/dL   Calcium 7.7 (L) 8.9 - 10.3 mg/dL   GFR calc non Af Amer 42 (L) >60 mL/min   Anion gap 8 5 - 15    Comment: Performed at [facility], [place] [address], [place], [place] [address]  CBC     Status: Abnormal   Collection Time: 03/05/20  3:14 AM  Result Value Ref Range   WBC 6.7 4.0 - 10.5 K/uL   RBC 3.24 (L) 4.22 - 5.81 MIL/uL   Hemoglobin 9.7 (L) 13.0 - 17.0 g/dL   HCT 29.5 (L) 39 - 52 %   MCV 91.0 80.0 - 100.0 fL   MCH 29.9 26.0 - 34.0 pg   MCHC 32.9 30.0 - 36.0 g/dL   RDW 19.6 (H) 11.5 - 15.5 %   Platelets 118 (L) 150 - 400 K/uL    Comment: REPEATED TO VERIFY PLATELET COUNT CONFIRMED BY SMEAR SPECIMEN CHECKED FOR CLOTS Immature Platelet Fraction may be clinically indicated, consider ordering this additional  test [id number]    nRBC 0.0 0.0 - 0.2 %    Comment: Performed at [facility], [place] [address], [place], [place] [address]  Glucose, capillary     Status: None   Collection Time: 03/05/20  7:40 AM  Result Value Ref Range   Glucose-Capillary 88 70 - 99 mg/dL    Comment: Glucose reference range applies only to samples taken after fasting for at least 8 hours.    MICRO: 10/6 blood cx peripheral NGTD 10/6 portacath cx = 2 species of staph IMAGING: US Paracentesis  Result Date: 03/03/2020 INDICATION: Patient with a history of colon cancer presents with new onset ascites. Interventional radiology asked to perform a therapeutic and diagnostic paracentesis. EXAM: ULTRASOUND GUIDED PARACENTESIS MEDICATIONS: 1% lidocaine 10 mL COMPLICATIONS: None immediate. PROCEDURE: Informed written consent was obtained from the patient after a discussion of the risks, benefits and alternatives to treatment. A timeout was performed prior to the initiation of the procedure. Initial ultrasound scanning demonstrates a large amount of ascites within the right lower abdominal quadrant. The right lower abdomen was prepped and draped in the usual sterile fashion. 1% lidocaine was used for local anesthesia. Following this, a 19 gauge, 7-cm, Yueh catheter was introduced. An ultrasound image was saved for documentation purposes. The paracentesis was performed. The catheter was removed and a dressing was applied. The patient tolerated the procedure well without immediate post procedural complication. FINDINGS: A total of approximately 5 L of clear fluid was removed. Samples were sent to the laboratory as requested by the clinical team. IMPRESSION: Successful ultrasound-guided paracentesis yielding 5 liters of peritoneal fluid. Read by: Soyla Dryer, NP Electronically Signed   By: Lucrezia Europe M.D.   On: 03/03/2020 16:31    Assessment/Plan: 69yo M immunocompromised host, feeling poorly on admit found to have worsening ascites, infectious work up found + portacath cx but negative peripheral  culture. Not necessarily sure if patient had true bacteremia since he had 2 different species isolated on his portacath and peripheral cultures are negative  -repeat peripheral blood cx - if peripheral blood cx are positive then will need to get echo and work up for endocarditis, and removal of portacath - continue with vancomycin, for now, can consider treatment for 7 days- repeat peripheral cultures at completion of treatment  Ascites = does not appear to be bacterial peritonitis, may benefit from repeat paracentesis  portacath = still using for chemo for metastatic pancreatic cancer   Caren Griffins B. [place] for Infectious Diseases [phone]

## 2020-03-05 NOTE — Consult Note (Addendum)
Referring Provider: Dr. Antonieta Pert Primary Care Physician:  Antonietta Jewel, MD Primary Gastroenterologist:  Dr. Hilarie Fredrickson    Reason for Consultation: Ascites, portal hypertension    HPI: Alex [name] is a 69 y.o. male with a past medical history of hypertension, coronary artery disease, hyperlipidemia, CVA 2010 on Plavix, pneumonia, adenocarcinoma to the sigmoid colon s/p partial colectomy and colostomy 09/20/2017,  pancreatic cancer with metastasis to the liver diagnosed 04/2018. He is followed by oncologist Dr. Benay Spice, he most recently received Gemcitabine and Abraxane on 02/13/2020. Abraxane is now on hold secondary to neuropathy.   He presented to [facility] emergency room on 03/03/2020 with altered mental status, slurred speech and swelling to his arms and abdomen x 2 days. Labs in the ED showed a sodium level of 131.  BUN 42.  Creatinine 1.90.  Alk phos 167.  Albumin 2.5.  AST 61.  ALT 34.  Total bili 1.6.  INR 1.3.  WBC 13.4.  An abdominal/pelvic CT scan showed a large volume of ascites, decrease in size of the distal tail of pancreas lesion and tiny bilateral lung nodules. Prior hepatic subcapsular metastatic lesions not seen, no new focal abnormality. The portal vein was patent with esophageal and gastric varices. He was started on Rocephin for SBP prophylaxis. Albumin 25gm  IV Q 6 hrs. A paracentesis was done with removal of 5 L of yellow peritoneal fluid.  No evidence of SBP.  SAAG > 1.1 which is consistent with portal hypertension. Cytology pending.  A GI consult was requested by Dr. Benay Spice for further portal hypertension management.  Currently, he reports feeling much better after his paracentesis. No further SOB. He ambulated up in the hall with PT without distress. No chest pain. He reported having swelling to his legs and abdomen for about 2 days prior to presenting to the ER. No history of liver disease, prior to his diagnosis of pancreatic cancer with liver metastasis. He  denies history of alcohol abuse. He previously drank 4 to 5 beers on the weekends from the age of 55 to his mid 92's. No alcohol for at least 23 years. No family history of liver disease. No N/V. No dysphagia or heartburn. No upper or lower abdominal pain. His colostomy stoma has protruded out from his abdominal wall for the past 2 weeks, no change in his colostomy output. He typically passes brown soft to loose stool, empties his colostomy bag at least twice daily. No bloody or black stools. His sister, [name], is at the bedside.   ED course: Sodium 131.  Potassium 5.1.  Glucose 158.  BUN 42.  Creatinine 1.90.  Calcium 7.8.  Alk phos 167.  Albumin 2.5.  AST 61.  ALT 34.  Total bili 1.6.  WBC 13.4.  Hemoglobin 13.1.  Hematocrit 40.4.  MCV 97.2.  Platelet 255.  INR 1.3.  BNP 105.7. TSH 3.191. Sars corona virus negative. Influenza A and B negative.   Abdominal/pelvic CT with contrast: 1. Interval development of large volume of ascites within the abdomen and pelvis. No discrete peritoneal nodule identified. The portal vein is patent. 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 Etiology is indeterminate. Differential considerations include (but not limited to): Portal venous hypertension, CHF, or peritoneal metastasis. Diagnostic and therapeutic paracentesis may be helpful for further investigation. 2. Decrease in size of distal tail of pancreas lesion. 3. Previously characterized subcapsular metastatic lesion within the liver is not confidently seen on today's study. No new focal liver abnormality  identified. 4. Similar appearance of tiny bilateral lung nodules within the imaged portions of the lower lung zones. 5. Aortic atherosclerosis.  Colonoscopy 02/07/2018 by Dr. Hilarie Fredrickson:  - Widely patent end colostomy with healthy appearing mucosa in the descending colon. - One 5 mm TA polyp in the cecum, removed with a cold snare. Resected and retrieved. - Four 3 to 6 mm polyps in  the ascending colon, removed with a cold snare. Resected and retrieved. - Six 4 to 8 mm TA polyps in the transverse colon, removed with a cold snare. Resected and retrieved. - Two 4 to 6 mm TA polyps in the descending colon, removed with a cold snare. Resected and retrieved. - Diverticulosis in the descending colon. - Non-patent end rectosigmoid stump, characterized by visible sutures and polypoid tissue. Biopsies were benign.  - One 6 mm TA polyp at the recto-sigmoid colon, removed with a hot snare. Resected and retrieved. - One 10 mm TA polyp in the rectum, removed with a hot snare. Resected and retrieved. - Internal hemorrhoids.  Flexible sigmoidoscopy 09/18/2017: - Likely malignant completely obstructing tumor in the distal sigmoid colon. Biopsied. Tattooed. - Two 5 to 12 mm polyps in the rectum and at the recto-sigmoid colon. Resection not attempted. - Internal hemorrhoids  ECHO 08/27/2017: - Left ventricle: The cavity size was normal. Wall thickness was  normal. Systolic function was vigorous. The estimated ejection  fraction was in the range of 65% to 70%. Wall motion was normal;  there were no regional wall motion abnormalities. Features are  consistent with a pseudonormal left ventricular filling pattern,  with concomitant abnormal relaxation and increased filling  pressure (grade 2 diastolic dysfunction).  - Aortic valve: There was mild regurgitation.   Past Medical History:  Diagnosis Date  . Abdominal distension 09/14/2017  . Adenocarcinoma of sigmoid colon ([place]) 2019   with involvement of rectosigmoid junction  . Anemia 08/26/2017  . Aortic atherosclerosis ([place]) 08/27/2017  . Atherosclerosis of arteries   . Carotid artery occlusion   . Colon obstruction ([place])   . Colon polyps   . Community acquired pneumonia of right lower lobe of lung 08/04/2017  . Dehydration   . Empyema ([place]) 08/27/2017  . Empyema of right pleural space ([place]) 08/30/2017  . High  cholesterol   . History of stroke 08/04/2017  . Hypertension   . Hypokalemia 09/14/2017  . Hyponatremia 08/26/2017  . Large bowel obstruction ([place]) 09/13/2017  . Lobar pneumonia ([place]) 08/27/2017  . Malignant tumor of sigmoid colon ([place])   . Nausea & vomiting 09/14/2017  . Nausea and vomiting 09/14/2017  . Pleural effusion 08/25/2017  . Pleural effusion, right 08/27/2017  . Pneumonia   . Pulmonary nodule, right 08/27/2017  . Stroke [facility]) 2010   denies residual on 09/18/2017    Past Surgical History:  Procedure Laterality Date  . ANKLE FRACTURE SURGERY Right 1984  . DECORTICATION Right 08/30/2017   Procedure: DECORTICATION of right lower lung lobe;  Surgeon: Prescott Gum, Collier Salina, MD;  Location: [place];  Service: Thoracic;  Laterality: Right;  . FLEXIBLE SIGMOIDOSCOPY N/A 09/18/2017   Procedure: FLEXIBLE SIGMOIDOSCOPY;  Surgeon: Jerene Bears, MD;  Location: [facility] ENDOSCOPY;  Service: Gastroenterology;  Laterality: N/A;  . PARTIAL COLECTOMY N/A 09/20/2017   Procedure: OPEN PARTIAL COLECTOMY WITH COLOSTOMY;  Surgeon: Clovis Riley, MD;  Location: [place];  Service: General;  Laterality: N/A;  . PORTACATH PLACEMENT Right 09/20/2018   Procedure: INSERTION PORT-A-CATH WITH ULTRASOUND;  Surgeon: Clovis Riley, MD;  Location:  [place];  Service: General;  Laterality: Right;  Marland Kitchen VIDEO ASSISTED THORACOSCOPY (VATS)/EMPYEMA Right 08/30/2017   Procedure: VIDEO ASSISTED THORACOSCOPY (VATS)/EMPYEMA   ;  Surgeon: Ivin Poot, MD;  Location: [place];  Service: Thoracic;  Laterality: Right;    Prior to Admission medications   Medication Sig Start Date End Date Taking? Authorizing Provider  acetaminophen (TYLENOL) 500 MG tablet Take 2 tablets (1,000 mg total) by mouth every 6 (six) hours as needed. Patient taking differently: Take 500 mg by mouth every 6 (six) hours as needed for moderate pain.  09/04/17  Yes Barrett, Erin R, PA-C  amLODipine (NORVASC) 10 MG tablet Take 10 mg by mouth daily.   Yes  [provider]  aspirin 81 MG tablet Take 81 mg by mouth daily. 05/01/18  Yes [provider]  atenolol (TENORMIN) 25 MG tablet Take 25 mg by mouth daily.   Yes [provider]  clopidogrel (PLAVIX) 75 MG tablet Take 75 mg by mouth daily.   Yes [provider]  gabapentin (NEURONTIN) 100 MG capsule Take 3 capsules (300 mg total) by mouth at bedtime. 12/29/19  Yes Ladell Pier, MD  hydrochlorothiazide (HYDRODIURIL) 25 MG tablet Take 25 mg by mouth daily.   Yes [provider]  lidocaine-prilocaine (EMLA) cream Apply 1 application topically as needed. Apply to portacath  1 1/2 -  2 hours prior to procedures as needed. Patient taking differently: Apply 1 application topically as needed (for port access).  10/31/19  Yes Owens Shark, NP  potassium chloride SA (KLOR-CON) 20 MEQ tablet Take 1 tablet (20 mEq total) by mouth daily. 07/11/19  Yes Ladell Pier, MD  prochlorperazine (COMPAZINE) 10 MG tablet Take 1 tablet (10 mg total) by mouth every 6 (six) hours as needed for nausea or vomiting. Patient taking differently: Take 10 mg by mouth daily.  01/16/20  Yes Ladell Pier, MD  simvastatin (ZOCOR) 20 MG tablet Take 20 mg by mouth at bedtime.    Yes [provider]    Current Facility-Administered Medications  Medication Dose Route Frequency Provider Last Rate Last Admin  . 0.9 %  sodium chloride infusion   Intravenous PRN Antonieta Pert, MD   Stopped at 03/04/20 1854  . acetaminophen (TYLENOL) tablet 650 mg  650 mg Oral Q6H PRN Shahmehdi, Seyed A, MD       Or  . acetaminophen (TYLENOL) suppository 650 mg  650 mg Rectal Q6H PRN Shahmehdi, Seyed A, MD      . albuterol (PROVENTIL) (2.5 MG/3ML) 0.083% nebulizer solution 2.5 mg  2.5 mg Nebulization Q2H PRN Shahmehdi, Seyed A, MD      . amLODipine (NORVASC) tablet 10 mg  10 mg Oral Daily Shahmehdi, Seyed A, MD   10 mg at 03/05/20 0949  . aspirin chewable tablet 81 mg  81 mg Oral Daily Shahmehdi,  Seyed A, MD   81 mg at 03/05/20 0949  . atenolol (TENORMIN) tablet 25 mg  25 mg Oral Daily Shahmehdi, Seyed A, MD   25 mg at 03/05/20 0950  . cefTRIAXone (ROCEPHIN) 2 g in sodium chloride 0.9 % 100 mL IVPB  2 g Intravenous Q24H Deatra James, MD   Stopped at 03/05/20 0541  . Chlorhexidine Gluconate Cloth 2 % PADS 6 each  6 each Topical Daily Antonieta Pert, MD   6 each at 03/05/20 0950  . clopidogrel (PLAVIX) tablet 75 mg  75 mg Oral Daily Shahmehdi, Valeria Batman, MD   75  mg at 03/05/20 0949  . docusate sodium (COLACE) capsule 100 mg  100 mg Oral BID Shahmehdi, Seyed A, MD   100 mg at 03/05/20 0949  . furosemide (LASIX) injection 20 mg  20 mg Intravenous Q8H Shahmehdi, Seyed A, MD   20 mg at 03/05/20 0511  . heparin injection 5,000 Units  5,000 Units Subcutaneous Q8H Skipper Cliche A, MD   5,000 Units at 03/05/20 0511  . HYDROmorphone (DILAUDID) injection 0.5-1 mg  0.5-1 mg Intravenous Q2H PRN Shahmehdi, Seyed A, MD      . levalbuterol (XOPENEX) nebulizer solution 0.63 mg  0.63 mg Nebulization Q6H PRN Shahmehdi, Seyed A, MD      . ondansetron (ZOFRAN) tablet 4 mg  4 mg Oral Q6H PRN Shahmehdi, Seyed A, MD       Or  . ondansetron (ZOFRAN) injection 4 mg  4 mg Intravenous Q6H PRN Shahmehdi, Seyed A, MD      . oxyCODONE (Oxy IR/ROXICODONE) immediate release tablet 5 mg  5 mg Oral Q4H PRN Shahmehdi, Seyed A, MD      . senna-docusate (Senokot-S) tablet 1 tablet  1 tablet Oral QHS PRN Shahmehdi, Seyed A, MD      . simvastatin (ZOCOR) tablet 20 mg  20 mg Oral QHS Shahmehdi, Seyed A, MD   20 mg at 03/04/20 2110  . sodium chloride flush (NS) 0.9 % injection 10-40 mL  10-40 mL Intracatheter Q12H Antonieta Pert, MD   10 mL at 03/04/20 2111  . sodium chloride flush (NS) 0.9 % injection 10-40 mL  10-40 mL Intracatheter PRN Kc, Ramesh, MD      . sodium chloride flush (NS) 0.9 % injection 3 mL  3 mL Intravenous Q12H Shahmehdi, Seyed A, MD   3 mL at 03/04/20 2111  . sorbitol 70 % solution 30 mL  30 mL Oral Daily PRN  Shahmehdi, Seyed A, MD      . traZODone (DESYREL) tablet 50 mg  50 mg Oral QHS PRN Deatra James, MD        Allergies as of 03/03/2020  . (No Known Allergies)    Family History  Problem Relation Age of Onset  . Heart disease Other   . Hypertension Father   . Stroke Father   . Heart disease Father   . Diabetes Sister   . Diabetes Brother   . Head & neck cancer Brother     Social History   Socioeconomic History  . Marital status: Divorced    Spouse name: Not on file  . Number of children: 2  . Years of education: Not on file  . Highest education level: Not on file  Occupational History  . Not on file  Tobacco Use  . Smoking status: Never Smoker  . Smokeless tobacco: Never Used  Vaping Use  . Vaping Use: Never used  Substance and Sexual Activity  . Alcohol use: Not Currently    Alcohol/week: 0.0 standard drinks    Comment: 09/18/2017 "nothing since the 1990s"  . Drug use: Yes    Types: Marijuana    Comment: 09/18/2017 "nothing since the 1980s"  . Sexual activity: Not Currently  Other Topics Concern  . Not on file  Social History Narrative  . Not on file   Social Determinants of Health   Financial Resource Strain:   . Difficulty of Paying Living Expenses: Not on file  Food Insecurity:   . Worried About Charity fundraiser in the Last Year: Not on file  .  Ran Out of Food in the Last Year: Not on file  Transportation Needs:   . Lack of Transportation (Medical): Not on file  . Lack of Transportation (Non-Medical): Not on file  Physical Activity:   . Days of Exercise per Week: Not on file  . Minutes of Exercise per Session: Not on file  Stress:   . Feeling of Stress : Not on file  Social Connections:   . Frequency of Communication with Friends and Family: Not on file  . Frequency of Social Gatherings with Friends and Family: Not on file  . Attends Religious Services: Not on file  . Active Member of Clubs or Organizations: Not on file  . Attends Theatre manager Meetings: Not on file  . Marital Status: Not on file  Intimate Partner Violence:   . Fear of Current or Ex-Partner: Not on file  . Emotionally Abused: Not on file  . Physically Abused: Not on file  . Sexually Abused: Not on file    Review of Systems: Gen: Denies fever, sweats or chills. No weight loss.  CV: Denies chest pain, palpitations or edema. Resp: SOB.  GI: See HPI.   GU : Denies urinary burning, blood in urine, increased urinary frequency or incontinence. MS: Denies joint pain, muscles aches or weakness. Derm: Denies rash, itchiness, skin lesions or unhealing ulcers. Psych: Denies depression, anxiety or memory loss.  Heme: Denies easy bruising, bleeding. Neuro:  + paresthesias.  Endo:  Denies any problems with DM, thyroid or adrenal function.  Physical Exam: Vital signs in last 24 hours: Temp:  [97.7 F (36.5 C)-98.1 F (36.7 C)] 98.1 F (36.7 C) (10/08 0553) Pulse Rate:  [63-83] 83 (10/08 0553) Resp:  [18] 18 (10/08 0553) BP: (101-130)/(60-73) 117/73 (10/08 0553) SpO2:  [96 %-99 %] 97 % (10/08 0553) Weight:  [98.7 kg] 98.7 kg (10/08 0500) Last BM Date: 03/04/20 General:  Alert 69 year old male in NAD. Ambulating up in the hall with a walker with PT.  Head:  Normocephalic and atraumatic. Eyes:  No scleral icterus. Conjunctiva pink. Ears:  Normal auditory acuity. Nose:  No deformity, discharge or lesions. Mouth:  Poor dentition.  No ulcers or lesions.  Neck:  Supple. No lymphadenopathy or thyromegaly.  Lungs: Breath sounds clear throughout.  Heart:  RRR, no murmurs.  Abdomen:  Large, distended abdomen with ascites but the abdomen is not tense.  Left mid abdomen with colostomy stoma protrudes 2 1/2 inches from the abdominal wall, brown soft to liquid stool in colostomy bag.  Rectal: Deferred. Musculoskeletal:  Symmetrical without gross deformities.  Pulses:  Normal pulses noted. Extremities: UEs with trace edema. LEs with 1 + edema.  Neurologic:   Alert and  oriented x4. No focal deficits.  Skin:  Intact without significant lesions or rashes. Psych:  Alert and cooperative. Normal mood and affect.  Intake/Output from previous day: 10/07 0701 - 10/08 0700 In: 1698.3 [P.O.:960; I.V.:55.4; IV Piggyback:682.9] Out: 1550 [Urine:1550] Intake/Output this shift: Total I/O In: 480 [P.O.:480] Out: 400 [Urine:400]  Lab Results: Recent Labs    03/03/20 1238 03/04/20 0330 03/05/20 0314  WBC 13.4* 9.1 6.7  HGB 13.1 10.7* 9.7*  HCT 40.4 32.9* 29.5*  PLT 255 151 118*   BMET Recent Labs    03/03/20 1238 03/04/20 0330 03/05/20 0314  NA 131* 130* 133*  K 5.1 4.8 3.9  CL 100 100 101  CO2 _0 GLUCOSE 158* 107* 102*  BUN 42* 44* 47*  CREATININE 1.90*  1.82* 1.66*  CALCIUM 7.8* 7.6* 7.7*   LFT Recent Labs    03/03/20 1238  PROT 6.3*  ALBUMIN 2.5*  AST 61*  ALT 34  ALKPHOS 167*  BILITOT 1.6*   PT/INR Recent Labs    03/03/20 1504  LABPROT 15.4*  INR 1.3*   Hepatitis Panel No results for input(s): HEPBSAG, HCVAB, HEPAIGM, HEPBIGM in the last 72 hours.    Studies/Results: DG Chest 2 View  Result Date: 03/03/2020 CLINICAL DATA:  Weakness. EXAM: CHEST - 2 VIEW COMPARISON:  September 20, 2018. FINDINGS: Stable cardiomediastinal silhouette. Right internal jugular Port-A-Cath is unchanged in position. No pneumothorax is noted. Left lung is clear. Elevated right hemidiaphragm is noted with mild right basilar subsegmental atelectasis or infiltrate. No significant pleural effusion is noted. Bony thorax is unremarkable. IMPRESSION: Elevated right hemidiaphragm with mild right basilar subsegmental atelectasis or infiltrate. Electronically Signed   By: Marijo Conception M.D.   On: 03/03/2020 12:30   CT Head Wo Contrast  Result Date: 03/03/2020 CLINICAL DATA:  Slurred speech, generalized weakness EXAM: CT HEAD WITHOUT CONTRAST TECHNIQUE: Contiguous axial images were obtained from the base of the skull through the vertex without  intravenous contrast. COMPARISON:  MRI 02/15/2011 FINDINGS: Brain: No evidence of acute infarction, hemorrhage, hydrocephalus, extra-axial collection or mass lesion/mass effect. Encephalomalacia within the posterior left temporoparietal region at site of previously seen infarct. Vascular: Atherosclerotic calcifications involving the large vessels of the skull base. No unexpected hyperdense vessel. Skull: Normal. Negative for fracture or focal lesion. Sinuses/Orbits: No acute finding. Other: None. IMPRESSION: No acute intracranial findings. Electronically Signed   By: Davina Poke D.O.   On: 03/03/2020 14:28   CT ABDOMEN PELVIS W CONTRAST  Result Date: 03/03/2020 CLINICAL DATA:  Abdominal distension. History of metastatic pancreas cancer. EXAM: CT ABDOMEN AND PELVIS WITH CONTRAST TECHNIQUE: Multidetector CT imaging of the abdomen and pelvis was performed using the standard protocol following bolus administration of intravenous contrast. CONTRAST:  39m OMNIPAQUE IOHEXOL 300 MG/ML  SOLN COMPARISON:  12/25/2019 FINDINGS: Lower chest: Pleuroparenchymal scarring is again noted within the right lung. No substantial change in the appearance of tiny bilateral lung nodules within the imaged portions of the lower lung zones. No acute abnormality identified. Hepatobiliary: Previously characterized subcapsular metastatic lesion is not confidently seen on today's study. No new focal liver abnormality. Within the right hepatic lobe gallbladder unremarkable. No biliary ductal dilatation. Pancreas: No signs of pancreatic inflammation or main duct dilatation. The index lesion within distal tail of pancreas measures 2.0 x 1.6 cm, image 36/2. Previously 3.0 x 1.6 cm. Spleen: Normal in size without focal abnormality. Adrenals/Urinary Tract: Normal appearance of the adrenal glands. No kidney mass or hydronephrosis identified. Similar appearance of scarring and volume loss from the inferior pole cortex of the left kidney. Urinary  bladder appears normal. Stomach/Bowel: Stomach is nondistended. No bowel wall thickening, inflammation, or distension identified. Left lower quadrant colostomy identified. 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 Vascular/Lymphatic: Aortic atherosclerosis. No aneurysm. The portal vein is patent. 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 No abdominopelvic adenopathy. Reproductive: Prostate is unremarkable. Other: Interval development of large volume of ascites within the abdomen and pelvis. No discrete peritoneal nodule. Musculoskeletal: No acute or significant osseous findings. IMPRESSION: 1. Interval development of large volume of ascites within the abdomen and pelvis. No discrete peritoneal nodule identified. Etiology is indeterminate. Differential considerations include (but not limited to): Portal venous hypertension, CHF, or peritoneal metastasis. Diagnostic and therapeutic paracentesis may be  helpful for further investigation. 2. Decrease in size of distal tail of pancreas lesion. 3. Previously characterized subcapsular metastatic lesion within the liver is not confidently seen on today's study. No new focal liver abnormality identified. 4. Similar appearance of tiny bilateral lung nodules within the imaged portions of the lower lung zones. 5. Aortic atherosclerosis. Aortic Atherosclerosis (ICD10-I70.0). Electronically Signed   By: Kerby Moors M.D.   On: 03/03/2020 14:37   US Paracentesis  Result Date: 03/03/2020 INDICATION: Patient with a history of colon cancer presents with new onset ascites. Interventional radiology asked to perform a therapeutic and diagnostic paracentesis. EXAM: ULTRASOUND GUIDED PARACENTESIS MEDICATIONS: 1% lidocaine 10 mL COMPLICATIONS: None immediate. PROCEDURE: Informed written consent was obtained from the patient after a discussion of the risks, benefits and alternatives to treatment. A timeout was performed prior to the initiation of the  procedure. Initial ultrasound scanning demonstrates a large amount of ascites within the right lower abdominal quadrant. The right lower abdomen was prepped and draped in the usual sterile fashion. 1% lidocaine was used for local anesthesia. Following this, a 19 gauge, 7-cm, Yueh catheter was introduced. An ultrasound image was saved for documentation purposes. The paracentesis was performed. The catheter was removed and a dressing was applied. The patient tolerated the procedure well without immediate post procedural complication. FINDINGS: A total of approximately 5 L of clear fluid was removed. Samples were sent to the laboratory as requested by the clinical team. IMPRESSION: Successful ultrasound-guided paracentesis yielding 5 liters of peritoneal fluid. Read by: Soyla Dryer, NP Electronically Signed   By: Lucrezia Europe M.D.   On: 03/03/2020 16:31    IMPRESSION/PLAN:  70.  69 year old male with a history of colon cancer s/p resection and colostomy 08/2017 and  pancreatic cancer 04/2018 with hepatic metastasis admitted to the hospital with generalized weakness and new onset ascites. Elevated LFTs. CTAP 10/6 showed a large volume of ascites, decrease in size of the distal tail of pancreas lesion and tiny bilateral lung nodules. Prior hepatic subcapsular metastatic lesions not seen, no new focal abnormality. The portal vein was patent with esophageal and gastric varices. S/P paracentesis 10/6 removed 5L of peritoneal fluid. No evidence of SBP. SAAG > 1.1 which is consistent with cirrhosis/portal HTN.  Cytology pending.  -Monitor LFTs daily -No plans for further cirrhosis serologies as an inpatient  -Await paracentesis cytology results  -No evidence of SBP, ok to discontinue Rocephin unless required for + blood cultures from his med port  ( + for staph hominis and staph epidermidis) defer to the hospitalist  -Continue IV albumin for now -Continue Lasix 45m  IV Q 8 hrs for now with close monitoring of  creatinine level.  -Add Spironolactone 517mpo QD with close monitoring of creatinine and potassium level -No plans for EGD at this juncture  -No plans for abdominal MRI  2. Hyponatremia, improving   3. AKI. Creatinine level improving. Cr. 1.82 -> 1.66  4. Anemia. Hg 10.7 -> 9.7 (Admission Hg 13.1). Patient remains on Plavix. No signs of active GI bleeding in setting of esophageal/gastric varices per CTAP on Plavix.  -Repeat CBC in am  5. Thrombocytopenia. PLT 255 -> 151 -> 118.   6. History of CVA on Plavix    Further recommendations per Dr. PeVerta Ellen Dorathy Daft10/12/2019, 11:01 AM   GI ATTENDING  History, laboratories, x-rays, prior endoscopy reports reviewed.  Patient seen and examined.  Agree with comprehensive consultation note as outlined above.  Unfortunate [age]ear old with  a history of colon cancer status post resection and subsequent colostomy as well as metastatic pancreatic cancer for which he is undergoing active treatment (temporary interruption for neuropathy-see oncology note).  Presents now with anasarca and symptomatic, new onset, ascites.  No evidence for SBP after large-volume paracentesis.  Cytology pending.  SAAG is greater than 1.1.  Imaging shows changes of portal hypertension (varices).  Received IV Lasix.  On Plavix for history of CVA.  Overall prognosis poor.  Etiology of likely underlying cirrhosis uncertain, though academic.  Follow-up on cytology to rule out malignant ascites.  In addition to Lasix, would add Aldactone 50 mg daily.  Closely monitor electrolytes and renal function.  May need periodic paracentesis.  Would not be too aggressive with diuresis at this point.  Would recommend discharging home on Lasix 40 mg daily and Aldactone 50 mg daily.  Low-sodium diet recommended as well.  If antibiotics were for possible SBP, these can be discontinued.  He should monitor his weights on a daily basis as an outpatient and record.  Outpatient GI follow-up  will be arranged, including blood work.  His primary gastroenterologist is Dr. Hilarie Fredrickson.  Please call for questions.  We will sign off.  Docia Chuck. Geri Seminole., M.D. [facility] Division of Gastroenterology

## 2020-03-05 NOTE — Progress Notes (Signed)
Pt alert and aware in bed sister at bedside. Pt states he is doing pretty good today. I explained to him my reason for being there was to help with and AD.  The chaplain was able to complete the AD paperwork. I offered caring and supportive presence, prayers and blessings.

## 2020-03-05 NOTE — Progress Notes (Signed)
IP PROGRESS NOTE  Subjective: [name] reports feeling better.  He is ambulating.  He continues to have neuropathy symptoms in the feet.  Objective: Vital signs in last 24 hours: Temp:  [98 F (36.7 C)-98.2 F (36.8 C)] 98.2 F (36.8 C) (10/08 1405) Pulse Rate:  [74-83] 75 (10/08 1405) Resp:  [18] 18 (10/08 1405) BP: (117-131)/(70-76) 131/76 (10/08 1405) SpO2:  [96 %-97 %] 97 % (10/08 1405) Weight:  [217 lb 9.5 oz (98.7 kg)] 217 lb 9.5 oz (98.7 kg) (10/08 0500)  Intake/Output from previous day: 10/07 0701 - 10/08 0700 In: 1698.3 [P.O.:960; I.V.:55.4; IV Piggyback:682.9] Out: 1550 [Urine:1550] Intake/Output this shift: Total I/O In: 720 [P.O.:720] Out: 400 [Urine:400]  HEENT: No thrush. Resp: Lungs clear anteriorly Cardio: Regular rate and rhythm. GI: Abdomen is markedly distended consistent with ascites.  Left lower quadrant colostomy Vascular: Pitting edema lower legs bilaterally. Port-A-Cath without erythema.  Lab Results:  Recent Labs    03/04/20 0330 03/05/20 0314  WBC 9.1 6.7  HGB 10.7* 9.7*  HCT 32.9* 29.5*  PLT 151 118*   BMET Recent Labs    03/04/20 0330 03/05/20 0314  NA 130* 133*  K 4.8 3.9  CL 100 101  CO2 22 24  GLUCOSE 107* 102*  BUN 44* 47*  CREATININE 1.82* 1.66*  CALCIUM 7.6* 7.7*    Studies/Results: US Paracentesis  Result Date: 03/03/2020 INDICATION: Patient with a history of colon cancer presents with new onset ascites. Interventional radiology asked to perform a therapeutic and diagnostic paracentesis. EXAM: ULTRASOUND GUIDED PARACENTESIS MEDICATIONS: 1% lidocaine 10 mL COMPLICATIONS: None immediate. PROCEDURE: Informed written consent was obtained from the patient after a discussion of the risks, benefits and alternatives to treatment. A timeout was performed prior to the initiation of the procedure. Initial ultrasound scanning demonstrates a large amount of ascites within the right lower abdominal quadrant. The right lower abdomen  was prepped and draped in the usual sterile fashion. 1% lidocaine was used for local anesthesia. Following this, a 19 gauge, 7-cm, Yueh catheter was introduced. An ultrasound image was saved for documentation purposes. The paracentesis was performed. The catheter was removed and a dressing was applied. The patient tolerated the procedure well without immediate post procedural complication. FINDINGS: A total of approximately 5 L of clear fluid was removed. Samples were sent to the laboratory as requested by the clinical team. IMPRESSION: Successful ultrasound-guided paracentesis yielding 5 liters of peritoneal fluid. Read by: Soyla Dryer, NP Electronically Signed   By: Lucrezia Europe M.D.   On: 03/03/2020 16:31    Medications: reviewed  Assessment/Plan: 1. 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 Well-differentiated adenocarcinoma, 0/13 lymph nodes positive, MSI-stable, no loss of mismatch repair protein expression ? Tumor involving the rectosigmoid junction ? Sigmoidoscopy 09/18/2017-distal sigmoid colon tumor beginning between 15 and 20 cm from the dentate line, completely obstructing ? CT abdomen/pelvis 09/14/2017-sigmoid thickening no evidence of metastatic disease ? 2 mm right upper lobe nodule on CT 08/04/2017 ? Colonoscopy 02/07/2018-polyps removed from the cecum, ascending colon, transverse colon, descending colon, rectosigmoid colon and rectum (tubular adenomas)  2. Right lung pneumonia/empyema March 2019  Right VATS, drainage of empyema, and decortication of the right lower lobe 08/31/2017  3.CVA in 2010  4.Rectal and active sigmoid polyps noted on flexible sigmoidoscopy 09/18/2017  5.Pancreas tail mass/right liver lesions and 4 mm splenic lesion on CT abdomen/pelvis 05/27/2018  MRI abdomen 07/27/2018-right liver lesions, rim-enhancing lesion in the pancreas tail  Ultrasound-guided  biopsy of a right liver lesion 08/14/2018-metastatic adenocarcinoma, cytokeratin 7+, CDX-2 weak positive, cytokeratin 20 negative, consistent with metastatic pancreas cancer; MSI stable; tumor mutational burden 0  CT abdomen/pelvis 09/26/2018-increased size of previously noted liver lesions, new right liver lesion, 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  Cycle 6 gemcitabine/Abraxane 12/20/2018  Cycle 7 gemcitabine/Abraxane 01/03/2019  Cycle 8 gemcitabine/Abraxane 01/17/2019  Cycle 9 gemcitabine/Abraxane 01/31/2019  Cycle 10 gemcitabine/Abraxane 02/13/2019  Cycle 11 gemcitabine/Abraxane 02/28/2019 (Abraxane held secondary to neuropathy symptoms)  Cycle 12 gemcitabine/Abraxane 03/14/2019 (Abraxane held due to persistent neuropathy symptoms)  Cycle 13 gemcitabine/Abraxane 03/28/2019 (Abraxane held due to persistent neuropathy symptoms)  Cycle 14 gemcitabine/Abraxane 04/11/2019 (Abraxane held due to persistent neuropathy symptoms)  CT abdomen/pelvis 04/28/2019-decrease in size of pancreatic mass. Decrease in size of hepatic metastatic foci.   Cycle 15 gemcitabine12/05/2018(Abraxane held due to neuropathy symptoms)  Cycle 16 gemcitabine 05/16/2019 (Abraxane held due to neuropathy symptoms)  Cycle 17 gemcitabine 05/31/2019 (Abraxane on hold due to neuropathy symptoms)  Cycle 18 gemcitabine 06/13/2019 (Abraxane held)  Cycle 19 gemcitabine 06/27/2019 (Abraxane held)  Cycle 20 gemcitabine 07/11/2019 (Abraxane held)  Cycle 21 gemcitabine 07/25/2019 (Abraxane held)  CTabdomen/pelvis 08/05/2019-stable small liver lesions, stable  pancreas mass, no evidence of disease progression  Cycle 22 gemcitabine 08/08/2019 (Abraxane held)  Cycle 23 gemcitabine 08/22/2019 (Abraxane held)  Cycle 24 gemcitabine 09/05/2019 (Abraxane held)  Cycle 25 gemcitabine 09/19/2019 (Abraxane held)  CT abdomen/pelvis 09/30/2019-no change in pancreas mass, slight enlargement of right liver metastases, largest measuring 1 cm, no new lesions  Cycle 26 gemcitabine/Abraxane 10/03/2019-Abraxane resumed at a dose of 100 mg/m  Cycle 27 gemcitabine/Abraxane 10/17/2019  Cycle 28 gemcitabine/Abraxane 10/31/2019  Cycle 29 gemcitabine/Abraxane 11/14/2019  Cycle 30 gemcitabine/Abraxane 11/28/2019  Cycle 31 gemcitabine/Abraxane 12/12/2019  CT abdomen/pelvis 12/25/2019-stable right hepatic lesion, no new liver lesions, stable pancreas tail mass, tiny bilateral lung base nodules, new compared to a chest CT 09/25/2017 and some have progressed compared to a's CT abdomen 08/05/2019  Cycle 32 gemcitabine/Abraxane 01/02/2020 (chemotherapy dose reduced due to neutropenia)  Cycle 33 gemcitabine/Abraxane 01/23/2020  Cycle 34 gemcitabine 02/13/2020 (Abraxane held secondary to neuropathy)  CT abdomen/pelvis 03/03/2020-no significant change in the appearance of tiny bilateral lung nodules at the lower lung zones.  Previously characterized subcapsular metastatic liver lesion not seen on current study.  No new focal liver abnormality.  No biliary ductal dilatation.  Index lesion distal tail of pancreas smaller.  Interval development of large volume ascites within the abdomen and pelvis.  No discrete peritoneal nodule.  Portal vein patent.  Esophageal and gastric varices identified similar to prior exam.  6.Port-A-Cath placement by Dr. 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  Hospitalization 03/03/2020 with weakness, marked abdominal distention   CT abdomen/pelvis 03/03/2020-no  significant change in the appearance of tiny bilateral lung nodules at the lower lung zones.  Previously characterized subcapsular metastatic liver lesion not seen on current study.  No new focal liver abnormality.  No biliary ductal dilatation.  Index lesion distal tail of pancreas smaller.  Interval development of large volume ascites within the abdomen and pelvis.  No discrete peritoneal nodule.  Portal vein patent.  Esophageal and  gastric varices identified similar to prior exam.  Brain CT 03/03/2020-negative  Paracentesis 03/04/2019 21-5 L of fluid removed.  Cytology negative.  10.  1 set of blood cultures from the Port-A-Cath positive for staph epidermidis  11.  Mild thrombocytopenia-likely secondary to chemotherapy  Mr. Demartini appears stable today.  He has persistent abdominal distention.  The cytology from the ascitic fluid is negative.  There is no clear evidence of progressive pancreas cancer.  He is stable for discharge from an oncology standpoint if he is ambulatory and capable of self-care.    Recommendations: 1. Management of ascites/portal hypertension per the gastroenterology service 2. Please call oncology as needed, outpatient follow-up will be scheduled at the Cancer center   LOS: 2 days    Betsy Coder, MD 03/05/2020 2:27 PM

## 2020-03-06 DIAGNOSIS — R188 Other ascites: Secondary | ICD-10-CM | POA: Diagnosis not present

## 2020-03-06 LAB — BASIC METABOLIC PANEL
Anion gap: 11 (ref 5–15)
BUN: 49 mg/dL — ABNORMAL HIGH (ref 8–23)
CO2: 23 mmol/L (ref 22–32)
Calcium: 7.7 mg/dL — ABNORMAL LOW (ref 8.9–10.3)
Chloride: 102 mmol/L (ref 98–111)
Creatinine, Ser: 1.36 mg/dL — ABNORMAL HIGH (ref 0.61–1.24)
GFR, Estimated: 53 mL/min — ABNORMAL LOW (ref >60)
Glucose, Bld: 81 mg/dL (ref 70–99)
Potassium: 3.4 mmol/L — ABNORMAL LOW (ref 3.5–5.1)
Sodium: 136 mmol/L (ref 135–145)

## 2020-03-06 LAB — CULTURE, BLOOD (ROUTINE X 2): Special Requests: ADEQUATE

## 2020-03-06 LAB — CBC
HCT: 31.1 % — ABNORMAL LOW (ref 39.0–52.0)
Hemoglobin: 10.1 g/dL — ABNORMAL LOW (ref 13.0–17.0)
MCH: 29.6 pg (ref 26.0–34.0)
MCHC: 32.5 g/dL (ref 30.0–36.0)
MCV: 91.2 fL (ref 80.0–100.0)
Platelets: 121 10*3/uL — ABNORMAL LOW (ref 150–400)
RBC: 3.41 MIL/uL — ABNORMAL LOW (ref 4.22–5.81)
RDW: 19.5 % — ABNORMAL HIGH (ref 11.5–15.5)
WBC: 7.7 10*3/uL (ref 4.0–10.5)
nRBC: 0 % (ref 0.0–0.2)

## 2020-03-06 LAB — AEROBIC CULTURE W GRAM STAIN (SUPERFICIAL SPECIMEN)
Culture: NO GROWTH
Gram Stain: NONE SEEN

## 2020-03-06 LAB — GLUCOSE, CAPILLARY: Glucose-Capillary: 87 mg/dL (ref 70–99)

## 2020-03-06 NOTE — Plan of Care (Signed)
  Problem: Education: Goal: Knowledge of General Education information will improve Description: Including pain rating scale, medication(s)/side effects and non-pharmacologic comfort measures Outcome: Progressing   Problem: Clinical Measurements: Goal: Diagnostic test results will improve Outcome: Progressing   

## 2020-03-06 NOTE — Progress Notes (Signed)
Physical Therapy Treatment Patient Details Name: Alex [name] MRN: [id number] DOB: [birth year] Today's Date: 03/06/2020    History of Present Illness Pt admitted with weakness and abdominal distention 2* Ascites in the setting of Pancreatic CA with liver mets.  Pt with hx of partial colectomy, colostomy and CVA (2010)  - pt states largely affected speech     PT Comments    Pt up to mobilize in halls with noted improvement in activity tolerance and continued improvement in stability but continues to require RW for balance.   Follow Up Recommendations  Home health PT     Equipment Recommendations  Rolling walker with 5" wheels    Recommendations for Other Services       Precautions / Restrictions Precautions Precautions: Fall Restrictions Weight Bearing Restrictions: No    Mobility  Bed Mobility Overal bed mobility: Needs Assistance Bed Mobility: Supine to Sit     Supine to sit: Supervision     General bed mobility comments: use of bed rail to move to sitting but no physical assist  Transfers Overall transfer level: Needs assistance Equipment used: Rolling walker (2 wheeled) Transfers: Sit to/from Stand Sit to Stand: Min guard         General transfer comment: cues for use of UEs to self assist  Ambulation/Gait Ambulation/Gait assistance: Min assist;Min guard Gait Distance (Feet): 440 Feet Assistive device: Rolling walker (2 wheeled) Gait Pattern/deviations: Step-to pattern;Step-through pattern;Decreased step length - right;Decreased step length - left;Shuffle;Trunk flexed Gait velocity: decr   General Gait Details: cues for posture and position from RW; improved stability noted from yesterday   Stairs             Wheelchair Mobility    Modified Rankin (Stroke Patients Only)       Balance Overall balance assessment: Independent Sitting-balance support: No upper extremity supported;Feet supported Sitting balance-Leahy Scale: Good      Standing balance support: No upper extremity supported Standing balance-Leahy Scale: Fair                              Cognition Arousal/Alertness: Awake/alert Behavior During Therapy: WFL for tasks assessed/performed Overall Cognitive Status: Within Functional Limits for tasks assessed                                        Exercises      General Comments        Pertinent Vitals/Pain Pain Assessment: No/denies pain    Home Living                      Prior Function            PT Goals (current goals can now be found in the care plan section) Acute Rehab PT Goals Patient Stated Goal: Regain IND  PT Goal Formulation: With patient Time For Goal Achievement: 03/18/20 Potential to Achieve Goals: Good Progress towards PT goals: Progressing toward goals    Frequency    Min 3X/week      PT Plan Current plan remains appropriate    Co-evaluation              AM-PAC PT "6 Clicks" Mobility   Outcome Measure  Help needed turning from your back to your side while in a flat bed without using bedrails?: None Help needed moving from lying  on your back to sitting on the side of a flat bed without using bedrails?: A Little Help needed moving to and from a bed to a chair (including a wheelchair)?: A Little Help needed standing up from a chair using your arms (e.g., wheelchair or bedside chair)?: A Little Help needed to walk in hospital room?: A Little Help needed climbing 3-5 steps with a railing? : A Little 6 Click Score: 19    End of Session Equipment Utilized During Treatment: Gait belt Activity Tolerance: Patient tolerated treatment well Patient left: in chair;with call bell/phone within reach;with chair alarm set Nurse Communication: Mobility status PT Visit Diagnosis: Unsteadiness on feet (R26.81);Muscle weakness (generalized) (M62.81);Difficulty in walking, not elsewhere classified (R26.2)     Time: 0312-8118 PT Time  Calculation (min) (ACUTE ONLY): 17 min  Charges:  $Gait Training: 8-22 mins                     [place] Pager [phone] Office [phone]    London Tarnowski 03/06/2020, 9:40 AM

## 2020-03-06 NOTE — Plan of Care (Signed)
  Problem: Education: Goal: Knowledge of General Education information will improve Description: Including pain rating scale, medication(s)/side effects and non-pharmacologic comfort measures 03/06/2020 1010 by Olen Cordial, RN Outcome: Progressing 03/06/2020 1010 by Olen Cordial, RN Outcome: Progressing   Problem: Clinical Measurements: Goal: Will remain free from infection 03/06/2020 1010 by Olen Cordial, RN Outcome: Progressing 03/06/2020 1010 by Olen Cordial, RN Outcome: Progressing   Problem: Clinical Measurements: Goal: Diagnostic test results will improve 03/06/2020 1010 by Olen Cordial, RN Outcome: Progressing 03/06/2020 1010 by Olen Cordial, RN Outcome: Progressing

## 2020-03-06 NOTE — Progress Notes (Signed)
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 Learta Codding, HTN, CAD, HLD, stroke, adenocarcinoma sigmoid resection colostomy, chronic anemia presented with progressive weakness fall progressive distention of abdomen.  CT abdomen in the ED showed decrease in size of distal tail of pancreas lesion, large volume ascites, metastatic lesion within the liver not confidently seen on this study compared to previous 1 and no new focal liver abnormalities, bilateral lung nodules CT head no finding. Patient was admitted underwent abdominal paracentesis with 5 L fluid removal-cytology was sent.  No evidence of SBP.Patient on empiric ceftriaxone. GI was consulted with his portal hypertension and ascites.  Was seen by Dr. Learta Codding from oncology.  Subjective:  Feels distended abdomen but no pain.  Leg swelling improving on Lasix.  Overall feeling much better.  No shortness of breath. Creatinine downtrending  Assessment & Plan:  Large volume Ascites with SAAG greater than 1.1 with evidence of portal hypertension/varices etiology of likely underlying cirrhosis uncertain.  Seen by GI, and IV Lasix advised Aldactone 50 mg daily and monitor electrolytes renal function closely.  Minute.  A paracentesis and would not do aggressive with the dialysis and advised Lasix 40 mg daily and Aldactone 50 on discharge low-sodium diet, okay to stop ceftriaxone as no evidence of SBP.  Cytology without evidence of malignant cells.  Patient feels bili is distended, I will see if we can do another paracentesis while he is here.  Staph epidermidis/Staph Hominis from port cath culture 1 bottle, peripheral culture NGTD, repeated 10/8, Seen by ID Dr Allean Found his immunocompromised status advised to continue vancomycin  x1 week and repeat peripheral cultures at completion of treatment.  Possible community-acquired pneumonia: less likely pneumonia,he has no respiratory symptoms.  AKI likely in the setting of ascites/decreased oral intake.  Creatinine is nicely improving at 1.3.  Change to oral Lasix. Recent Labs  Lab 03/03/20 1238 03/04/20 0330 03/05/20 0314 03/06/20 0345  BUN 42* 44* 47* 49*  CREATININE 1.90* 1.82* 1.66* 1.36*   Cancer of pancreas, tail: CT head demonstrates a decrease in size of the mass as well as metastatic lesion in the liver.  Dr. Learta Codding from oncology is following, appreciate input. Cytology from ascitic fluid negative for malignancy-oncology is planning for outpatient follow-up.  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  Hyponatremia sodium improving,Monitor closely. Avoid ivf 2/2 ascites.  Anemia: HB at 10.1 g stable.  Monitor   Recent Labs  Lab 03/03/20 1238 03/04/20 0330 03/05/20 0314 03/06/20 0345  HGB 13.1 10.7* 9.7* 10.1*  HCT 40.4 32.9* 29.5* 31.1*   Nausea & vomiting: Tolerating diet.  Continue supportive measures.    Nutrition: Diet Order            Diet regular Room service appropriate? Yes; Fluid consistency: Thin  Diet effective now                 Body mass index is 35.12 kg/m.  DVT prophylaxis: heparin injection 5,000 Units Start: 03/03/20 1600 SCDs Start: 03/03/20 1504 Place TED hose Start: 03/03/20 1504 Code Status:   Code Status: Full Code  Family Communication: plan of care discussed with patient at  bedside.  Status is: Inpatient Remains inpatient appropriate because:IV treatments appropriate due to intensity of illness or inability to take PO and Inpatient level of care appropriate due to severity of illness  Dispo: The patient is from: Home              Anticipated  d/c is to: Home PT/OT               Anticipated d/c date is: 2 days              Patient currently is not medically stable to d/c. Consultants:see note  Procedures:see note  Culture/Microbiology    Component Value Date/Time   SDES  03/05/2020 1733    BLOOD LEFT ANTECUBITAL Performed at [facility] [address] [place], [place] [address]    SDES  03/05/2020 1733    BLOOD LEFT HAND Performed at [facility], [place] [address], [place], [place] [address]    [place]  03/05/2020 1733    BOTTLES DRAWN AEROBIC AND ANAEROBIC Blood Culture adequate volume Performed at [facility] [address], [place], [place] [address]    [place]  03/05/2020 1733    BOTTLES DRAWN AEROBIC AND ANAEROBIC Blood Culture adequate volume Performed at [facility] [address], [place], [place] [address]    CULT  03/05/2020 1733    NO GROWTH < 12 HOURS Performed at [facility] [address] [place], [place] [address]    CULT  03/05/2020 1733    NO GROWTH < 12 HOURS Performed at [facility] [address] [place], [place] [address]    REPTSTATUS PENDING 03/05/2020 1733   REPTSTATUS PENDING 03/05/2020 1733    Other culture-see note  Medications: Scheduled Meds:  amLODipine  10 mg Oral Daily   aspirin  81 mg Oral Daily   atenolol  25 mg Oral Daily   Chlorhexidine Gluconate Cloth  6 each Topical Daily   clopidogrel  75 mg Oral Daily   docusate sodium  100 mg Oral BID   furosemide  20 mg Intravenous Q8H   heparin  5,000 Units Subcutaneous Q8H   simvastatin  20 mg Oral QHS   sodium chloride flush  10-40 mL Intracatheter Q12H   sodium chloride flush  3 mL Intravenous Q12H   spironolactone  50 mg Oral Daily   Continuous Infusions:  sodium chloride Stopped (03/04/20 1854)   cefTRIAXone (ROCEPHIN)  IV 2 g (03/06/20 0554)   vancomycin 1,500 mg (03/05/20 2248)     Antimicrobials: Anti-infectives (From admission, onward)   Start     Dose/Rate Route Frequency Ordered Stop   03/05/20 2000  vancomycin (VANCOREADY) IVPB 1500 mg/300 mL        1,500 mg 150 mL/hr over 120 Minutes Intravenous Every 24 hours 03/05/20 1819     03/04/20 2200  vancomycin (VANCOREADY) IVPB 1250 mg/250 mL  Status:  Discontinued        1,250 mg 166.7 mL/hr over 90 Minutes Intravenous Every 24 hours 03/04/20 1155 03/05/20 1059   03/04/20 0500  cefTRIAXone (ROCEPHIN) 2 g in sodium chloride 0.9 % 100 mL IVPB        2 g 200 mL/hr over 30 Minutes Intravenous Every 24 hours 03/03/20 1511     03/03/20 1500  vancomycin (VANCOREADY) IVPB 2000 mg/400 mL        2,000 mg 200 mL/hr over 120 Minutes Intravenous  Once 03/03/20 1447 03/03/20 2012  03/03/20 1445  ceFEPIme (MAXIPIME) 2 g in sodium chloride 0.9 % 100 mL IVPB        2 g 200 mL/hr over 30 Minutes Intravenous  Once 03/03/20 1435 03/03/20 1811     Objective: Vitals: Today's Vitals   03/05/20 1551 03/05/20 2151 03/06/20 0539 03/06/20 0710  BP:  130/79 109/61   Pulse:  75 65   Resp:  (!) 21 14   Temp:  98 F (36.7 C) 97.8 F (36.6 C)   TempSrc:      SpO2:  98% 97%   Weight:      Height:      PainSc: 0-No pain   0-No pain    Intake/Output Summary (Last 24 hours) at 03/06/2020 1153 Last data filed at 03/06/2020 0900 Gross per 24 hour  Intake 720 ml  Output 650 ml  Net 70 ml   Filed Weights   03/03/20 1133 03/05/20 0500  Weight: 98.9 kg 98.7 kg   Weight change:   Intake/Output from previous day: 10/08 0701 - 10/09 0700 In: 960 [P.O.:960] Out: 650 [Urine:550; Stool:100] Intake/Output this shift: Total I/O In: 240 [P.O.:240] Out: 400 [Urine:400]  Examination: General exam: AAOx3 , NAD, weak appearing. HEENT:Oral mucosa moist, Ear/Nose WNL grossly, dentition normal. Respiratory system: bilaterally clear,no wheezing or crackles,no use of accessory muscle Cardiovascular system: S1 & S2 +, No  JVD,. Gastrointestinal system: Abdomen soft, distended and non-tender,BS+ Nervous System:Alert, awake, moving extremities and grossly nonfocal Extremities:  ankle edema- improving,distal peripheral pulses palpable.  Skin: No rashes,no icterus. MSK: Normal muscle bulk,tone, power  Data Reviewed: I have personally reviewed following labs and imaging studies CBC: Recent Labs  Lab 03/03/20 1238 03/04/20 0330 03/05/20 0314 03/06/20 0345  WBC 13.4* 9.1 6.7 7.7  NEUTROABS 9.7*  --   --   --   HGB 13.1 10.7* 9.7* 10.1*  HCT 40.4 32.9* 29.5* 31.1*  MCV 92.7 92.9 91.0 91.2  PLT 255 151 118* 226*   Basic Metabolic Panel: Recent Labs  Lab 03/03/20 1237 03/03/20 1238 03/04/20 0330 03/05/20 0314 03/06/20 0345  NA  --  131* 130* 133* 136  K  --  5.1 4.8 3.9 3.4*  CL  --  100 100 101 102  CO2  --  23 22 24 23   GLUCOSE  --  158* 107* 102* 81  BUN  --  42* 44* 47* 49*  CREATININE  --  1.90* 1.82* 1.66* 1.36*  CALCIUM 8.0* 7.8* 7.6* 7.7* 7.7*  MG 2.9*  --   --   --   --   PHOS 3.9  --   --   --   --    GFR: Estimated Creatinine Clearance: 57.2 mL/min (A) (by C-G formula based on SCr of 1.36 mg/dL (H)). Liver Function Tests: Recent Labs  Lab 03/03/20 1238  AST 61*  ALT 34  ALKPHOS 167*  BILITOT 1.6*  PROT 6.3*  ALBUMIN 2.5*   No results for input(s): LIPASE, AMYLASE in the last 168 hours. No results for input(s): AMMONIA in the last 168 hours. Coagulation Profile: Recent Labs  Lab 03/03/20 1504  INR 1.3*   Cardiac Enzymes: No results for input(s): CKTOTAL, CKMB, CKMBINDEX, TROPONINI in the last 168 hours. BNP (last 3 results) No results for input(s): PROBNP in the last 8760 hours. HbA1C: Recent Labs    03/03/20 1700  HGBA1C 5.6   CBG: Recent Labs  Lab 03/04/20 0750 03/05/20 0740 03/06/20 0734  GLUCAP 99 88 87   Lipid Profile: No  results for input(s): CHOL, HDL, LDLCALC, TRIG, CHOLHDL, LDLDIRECT in the last 72 hours. Thyroid Function Tests: Recent Labs     03/03/20 1700  TSH 3.191   Anemia Panel: No results for input(s): VITAMINB12, FOLATE, FERRITIN, TIBC, IRON, RETICCTPCT in the last 72 hours. Sepsis Labs: No results for input(s): PROCALCITON, LATICACIDVEN in the last 168 hours.  Recent Results (from the past 240 hour(s))  Respiratory Panel by RT PCR (Flu A&B, Covid) - Nasopharyngeal Swab     Status: None   Collection Time: 03/03/20  2:18 PM   Specimen: Nasopharyngeal Swab  Result Value Ref Range Status   SARS Coronavirus 2 by RT PCR NEGATIVE NEGATIVE Final    Comment: (NOTE) SARS-CoV-2 target nucleic acids are NOT DETECTED.  The SARS-CoV-2 RNA is generally detectable in upper respiratoy specimens during the acute phase of infection. The lowest concentration of SARS-CoV-2 viral copies this assay can detect is 131 copies/mL. A negative result does not preclude SARS-Cov-2 infection and should not be used as the sole basis for treatment or other patient management decisions. A negative result may occur with  improper specimen collection/handling, submission of specimen other than nasopharyngeal swab, presence of viral mutation(s) within the areas targeted by this assay, and inadequate number of viral copies (<131 copies/mL). A negative result must be combined with clinical observations, patient history, and epidemiological information. The expected result is Negative.  Fact Sheet for Patients:  PinkCheek.be  Fact Sheet for Healthcare Providers:  GravelBags.it  This test is no t yet approved or cleared by the [place] FDA and  has been authorized for detection and/or diagnosis of SARS-CoV-2 by FDA under an Emergency Use Authorization (EUA). This EUA will remain  in effect (meaning this test can be used) for the duration of the COVID-19 declaration under Section 564(b)(1) of the Act, 21 U.S.C. section 360bbb-3(b)(1), unless the authorization is terminated or revoked  sooner.     Influenza A by PCR NEGATIVE NEGATIVE Final   Influenza B by PCR NEGATIVE NEGATIVE Final    Comment: (NOTE) The Xpert Xpress SARS-CoV-2/FLU/RSV assay is intended as an aid in  the diagnosis of influenza from Nasopharyngeal swab specimens and  should not be used as a sole basis for treatment. Nasal washings and  aspirates are unacceptable for Xpert Xpress SARS-CoV-2/FLU/RSV  testing.  Fact Sheet for Patients: PinkCheek.be  Fact Sheet for Healthcare Providers: GravelBags.it  This test is not yet approved or cleared by the [place] FDA and  has been authorized for detection and/or diagnosis of SARS-CoV-2 by  FDA under an Emergency Use Authorization (EUA). This EUA will remain  in effect (meaning this test can be used) for the duration of the  Covid-19 declaration under Section 564(b)(1) of the Act, 21  U.S.C. section 360bbb-3(b)(1), unless the authorization is  terminated or revoked. Performed at [facility], [place] [address] [place], [place] [address]   Body fluid culture     Status: None (Preliminary result)   Collection Time: 03/03/20  4:38 PM   Specimen: PATH Cytology Peritoneal fluid  Result Value Ref Range Status   Specimen Description   Final    PERITONEAL Performed at [facility] [address], [place], [place] [address]    Special Requests   Final    NONE Performed at [facility], [place] [address] [place], [place] [address]    Gram Stain   Final    WBC PRESENT, PREDOMINANTLY MONONUCLEAR NO ORGANISMS SEEN CYTOSPIN SMEAR Gram  Stain Report Called to,Read Back By and Verified With: C.SIMPSON AT 1848 ON 03/03/20 BY N.THOMPSON Performed at [facility], [place] [address], [place], [place] [address]    Culture   Final    NO GROWTH 2 DAYS Performed at [facility] [address] [place], [place] [address]    Report  Status PENDING  Incomplete  Aerobic Culture (superficial specimen)     Status: None   Collection Time: 03/03/20  4:38 PM   Specimen: PATH Cytology Peritoneal fluid  Result Value Ref Range Status   Specimen Description   Final    PERITONEAL Performed at [facility] [address] [place], [place] [address]    Special Requests   Final    NONE Performed at [facility], [place] [address], [place], [place] [address]    Gram Stain NO WBC SEEN NO ORGANISMS SEEN   Final   Culture   Final    NO GROWTH 2 DAYS Performed at [facility], [place] [address], [place], [place] [address]    Report Status 03/06/2020 FINAL  Final  Blood culture (routine x 2)     Status: Abnormal   Collection Time: 03/03/20  4:49 PM   Specimen: BLOOD  Result Value Ref Range Status   Specimen Description   Final    BLOOD PORTA CATH RIGHT CHEST Performed at [facility] [address], [place], [place] [address]    Special Requests   Final    BOTTLES DRAWN AEROBIC AND ANAEROBIC Blood Culture adequate volume Performed at [facility] [address] [place], [place] [address]    Culture  Setup Time   Final    GRAM POSITIVE COCCI IN CLUSTERS IN BOTH AEROBIC AND ANAEROBIC BOTTLES CRITICAL RESULT CALLED TO, READ BACK BY AND VERIFIED WITH: PHARMD C.SHADE AT 1100 ON 03/04/2020 BY T.SAAD    Culture (A)  Final    STAPHYLOCOCCUS HOMINIS STAPHYLOCOCCUS EPIDERMIDIS THE SIGNIFICANCE OF ISOLATING THIS ORGANISM FROM A SINGLE SET OF BLOOD CULTURES WHEN MULTIPLE SETS ARE DRAWN IS UNCERTAIN. PLEASE NOTIFY THE MICROBIOLOGY DEPARTMENT WITHIN ONE WEEK IF SPECIATION AND SENSITIVITIES ARE REQUIRED. Performed at [facility], [place] [address] [place], [place] [address]    Report Status 03/06/2020 FINAL  Final  Blood Culture ID Panel (Reflexed)     Status: Abnormal   Collection Time: 03/03/20  4:49 PM  Result Value Ref Range Status    Enterococcus faecalis NOT DETECTED NOT DETECTED Final   Enterococcus Faecium NOT DETECTED NOT DETECTED Final   Listeria monocytogenes NOT DETECTED NOT DETECTED Final   Staphylococcus species DETECTED (A) NOT DETECTED Final    Comment: CRITICAL RESULT CALLED TO, READ BACK BY AND VERIFIED WITH: PHARMD C.SHADE AT 1100 ON 03/04/2020 BY T.SAAD    Staphylococcus aureus (BCID) NOT DETECTED NOT DETECTED Final   Staphylococcus epidermidis DETECTED (A) NOT DETECTED Final    Comment: Methicillin (oxacillin) resistant coagulase negative staphylococcus. Possible blood culture contaminant (unless isolated from more than one blood culture draw or clinical case suggests pathogenicity). No antibiotic treatment is indicated for blood  culture contaminants. CRITICAL RESULT CALLED TO, READ BACK BY AND VERIFIED WITH: PHARMD C.SHADE AT 1100 ON 03/04/2020 BY T.SAAD    Staphylococcus lugdunensis NOT DETECTED NOT DETECTED Final   Streptococcus species NOT DETECTED NOT DETECTED Final   Streptococcus agalactiae NOT DETECTED NOT DETECTED Final   Streptococcus pneumoniae NOT DETECTED NOT DETECTED Final   Streptococcus pyogenes  NOT DETECTED NOT DETECTED Final   A.calcoaceticus-baumannii NOT DETECTED NOT DETECTED Final   Bacteroides fragilis NOT DETECTED NOT DETECTED Final   Enterobacterales NOT DETECTED NOT DETECTED Final   Enterobacter cloacae complex NOT DETECTED NOT DETECTED Final   Escherichia coli NOT DETECTED NOT DETECTED Final   Klebsiella aerogenes NOT DETECTED NOT DETECTED Final   Klebsiella oxytoca NOT DETECTED NOT DETECTED Final   Klebsiella pneumoniae NOT DETECTED NOT DETECTED Final   Proteus species NOT DETECTED NOT DETECTED Final   Salmonella species NOT DETECTED NOT DETECTED Final   Serratia marcescens NOT DETECTED NOT DETECTED Final   Haemophilus influenzae NOT DETECTED NOT DETECTED Final   Neisseria meningitidis NOT DETECTED NOT DETECTED Final   Pseudomonas aeruginosa NOT DETECTED NOT DETECTED  Final   Stenotrophomonas maltophilia NOT DETECTED NOT DETECTED Final   Candida albicans NOT DETECTED NOT DETECTED Final   Candida auris NOT DETECTED NOT DETECTED Final   Candida glabrata NOT DETECTED NOT DETECTED Final   Candida krusei NOT DETECTED NOT DETECTED Final   Candida parapsilosis NOT DETECTED NOT DETECTED Final   Candida tropicalis NOT DETECTED NOT DETECTED Final   Cryptococcus neoformans/gattii NOT DETECTED NOT DETECTED Final   Methicillin resistance mecA/C DETECTED (A) NOT DETECTED Final    Comment: CRITICAL RESULT CALLED TO, READ BACK BY AND VERIFIED WITH: PHARMD C.SHADE AT 1100 ON 03/04/2020 BY T.SAAD Performed at [facility], [place] [address], [place], [place] [address]   Blood culture (routine x 2)     Status: None (Preliminary result)   Collection Time: 03/03/20  5:09 PM   Specimen: BLOOD  Result Value Ref Range Status   Specimen Description   Final    BLOOD LEFT ANTECUBITAL Performed at [facility] [address], [place], [place] [address]    Special Requests   Final    BOTTLES DRAWN AEROBIC AND ANAEROBIC Blood Culture adequate volume Performed at [facility] [address], [place], [place] [address]    Culture   Final    NO GROWTH 3 DAYS Performed at [facility], [place] [address], [place], [place] [address]    Report Status PENDING  Incomplete  Culture, blood (Routine X 2) w Reflex to ID Panel     Status: None (Preliminary result)   Collection Time: 03/05/20  5:33 PM   Specimen: BLOOD  Result Value Ref Range Status   Specimen Description   Final    BLOOD LEFT ANTECUBITAL Performed at [facility] [address] [place], [place] [address]    Special Requests   Final    BOTTLES DRAWN AEROBIC AND ANAEROBIC Blood Culture adequate volume Performed at [facility] [address] [place], [place] [address]    Culture   Final    NO GROWTH < 12 HOURS Performed at  [facility] [address] [place], [place] [address]    Report Status PENDING  Incomplete  Culture, blood (Routine X 2) w Reflex to ID Panel     Status: None (Preliminary result)   Collection Time: 03/05/20  5:33 PM   Specimen: BLOOD LEFT HAND  Result Value Ref Range Status   Specimen Description   Final    BLOOD LEFT HAND Performed at [facility] [address], [place],  [address]    Special Requests   Final    BOTTLES DRAWN AEROBIC AND ANAEROBIC Blood Culture adequate volume Performed at [facility], [address] [place], [place] [address]    Culture   Final    NO GROWTH < 12 HOURS Performed at [facility] [address] [place], [place] [address]    Report Status PENDING  Incomplete     Radiology Studies: No results found.   LOS: 3 days   Antonieta Pert, MD [facility]  03/06/2020, 11:53 AM

## 2020-03-07 ENCOUNTER — Inpatient Hospital Stay (HOSPITAL_COMMUNITY): Payer: Medicare Other

## 2020-03-07 DIAGNOSIS — R188 Other ascites: Secondary | ICD-10-CM | POA: Diagnosis not present

## 2020-03-07 LAB — COMPREHENSIVE METABOLIC PANEL
ALT: 23 U/L (ref 0–44)
AST: 45 U/L — ABNORMAL HIGH (ref 15–41)
Albumin: 2.8 g/dL — ABNORMAL LOW (ref 3.5–5.0)
Alkaline Phosphatase: 118 U/L (ref 38–126)
Anion gap: 9 (ref 5–15)
BUN: 51 mg/dL — ABNORMAL HIGH (ref 8–23)
CO2: 24 mmol/L (ref 22–32)
Calcium: 7.7 mg/dL — ABNORMAL LOW (ref 8.9–10.3)
Chloride: 100 mmol/L (ref 98–111)
Creatinine, Ser: 1.53 mg/dL — ABNORMAL HIGH (ref 0.61–1.24)
GFR, Estimated: 46 mL/min — ABNORMAL LOW (ref >60)
Glucose, Bld: 101 mg/dL — ABNORMAL HIGH (ref 70–99)
Potassium: 3.4 mmol/L — ABNORMAL LOW (ref 3.5–5.1)
Sodium: 133 mmol/L — ABNORMAL LOW (ref 135–145)
Total Bilirubin: 0.9 mg/dL (ref 0.3–1.2)
Total Protein: 5.6 g/dL — ABNORMAL LOW (ref 6.5–8.1)

## 2020-03-07 LAB — CBC
HCT: 32.4 % — ABNORMAL LOW (ref 39.0–52.0)
Hemoglobin: 10.8 g/dL — ABNORMAL LOW (ref 13.0–17.0)
MCH: 30.3 pg (ref 26.0–34.0)
MCHC: 33.3 g/dL (ref 30.0–36.0)
MCV: 90.8 fL (ref 80.0–100.0)
Platelets: 128 10*3/uL — ABNORMAL LOW (ref 150–400)
RBC: 3.57 MIL/uL — ABNORMAL LOW (ref 4.22–5.81)
RDW: 19.5 % — ABNORMAL HIGH (ref 11.5–15.5)
WBC: 9.1 10*3/uL (ref 4.0–10.5)
nRBC: 0 % (ref 0.0–0.2)

## 2020-03-07 LAB — BODY FLUID CULTURE: Culture: NO GROWTH

## 2020-03-07 LAB — GLUCOSE, CAPILLARY: Glucose-Capillary: 107 mg/dL — ABNORMAL HIGH (ref 70–99)

## 2020-03-07 MED ORDER — FUROSEMIDE 40 MG PO TABS
40.0000 mg | ORAL_TABLET | Freq: Every day | ORAL | Status: DC
  Administered 2020-03-08 – 2020-03-09 (×2): 40 mg via ORAL
  Filled 2020-03-07 (×3): qty 1

## 2020-03-07 MED ORDER — LIDOCAINE HCL 1 % IJ SOLN
INTRAMUSCULAR | Status: AC
  Filled 2020-03-07: qty 20

## 2020-03-07 MED ORDER — POTASSIUM CHLORIDE CRYS ER 20 MEQ PO TBCR
40.0000 meq | EXTENDED_RELEASE_TABLET | Freq: Once | ORAL | Status: AC
  Administered 2020-03-07: 40 meq via ORAL
  Filled 2020-03-07: qty 2

## 2020-03-07 NOTE — Procedures (Signed)
PROCEDURE SUMMARY:  Successful US guided paracentesis from right lateral abdomen Yielded 3400 ml of clear yellow fluid.  No immediate complications.  Pt tolerated well.   Specimen sent for labs.  EBL < 2 ml  Theresa Duty, NP 03/07/2020 1:16 PM

## 2020-03-07 NOTE — Plan of Care (Signed)
  Problem: Education: Goal: Knowledge of General Education information will improve Description: Including pain rating scale, medication(s)/side effects and non-pharmacologic comfort measures Outcome: Progressing   Problem: Clinical Measurements: Goal: Will remain free from infection Outcome: Progressing   Problem: Clinical Measurements: Goal: Ability to maintain clinical measurements within normal limits will improve Outcome: Progressing

## 2020-03-07 NOTE — Progress Notes (Signed)
Physical Therapy Treatment Patient Details Name: Alex [name] MRN: [id number] DOB: 1951-02-17 Today's Date: 03/07/2020    History of Present Illness Pt admitted with weakness and abdominal distention 2* Ascites in the setting of Pancreatic CA with liver mets.  Pt with hx of partial colectomy, colostomy and CVA (2010)  - pt states largely affected speech     PT Comments    Pt up to ambulate in halls with min guard assist but disrupted by colostomy bag leakage.  RN in and pt assisted with hygiene and change of clothes before completing ambulation task.   Follow Up Recommendations  Home health PT     Equipment Recommendations  Rolling walker with 5" wheels    Recommendations for Other Services       Precautions / Restrictions Precautions Precautions: Fall Restrictions Weight Bearing Restrictions: No    Mobility  Bed Mobility Overal bed mobility: Needs Assistance Bed Mobility: Supine to Sit     Supine to sit: Supervision     General bed mobility comments: use of bed rail to move to sitting but no physical assist  Transfers Overall transfer level: Needs assistance Equipment used: Rolling walker (2 wheeled) Transfers: Sit to/from Stand Sit to Stand: Supervision         General transfer comment: cues for use of UEs to self assist  Ambulation/Gait Ambulation/Gait assistance: Min guard Gait Distance (Feet): 475 Feet Assistive device: Rolling walker (2 wheeled) Gait Pattern/deviations: Step-to pattern;Step-through pattern;Decreased step length - right;Decreased step length - left;Shuffle;Trunk flexed Gait velocity: decr   General Gait Details: cues for posture and position from RW; improved stability noted from yesterday   Stairs             Wheelchair Mobility    Modified Rankin (Stroke Patients Only)       Balance Overall balance assessment: Independent Sitting-balance support: No upper extremity supported;Feet supported Sitting  balance-Leahy Scale: Good     Standing balance support: No upper extremity supported Standing balance-Leahy Scale: Fair                              Cognition Arousal/Alertness: Awake/alert Behavior During Therapy: WFL for tasks assessed/performed Overall Cognitive Status: Within Functional Limits for tasks assessed                                        Exercises      General Comments        Pertinent Vitals/Pain Pain Assessment: No/denies pain    Home Living                      Prior Function            PT Goals (current goals can now be found in the care plan section) Acute Rehab PT Goals Patient Stated Goal: Regain IND  PT Goal Formulation: With patient Time For Goal Achievement: 03/18/20 Potential to Achieve Goals: Good Progress towards PT goals: Progressing toward goals    Frequency    Min 3X/week      PT Plan Current plan remains appropriate    Co-evaluation              AM-PAC PT "6 Clicks" Mobility   Outcome Measure  Help needed turning from your back to your side while in a flat bed without using bedrails?:  None Help needed moving from lying on your back to sitting on the side of a flat bed without using bedrails?: A Little Help needed moving to and from a bed to a chair (including a wheelchair)?: A Little Help needed standing up from a chair using your arms (e.g., wheelchair or bedside chair)?: A Little Help needed to walk in hospital room?: A Little Help needed climbing 3-5 steps with a railing? : A Little 6 Click Score: 19    End of Session   Activity Tolerance: Patient tolerated treatment well Patient left: in chair;with call bell/phone within reach;with chair alarm set Nurse Communication: Mobility status PT Visit Diagnosis: Unsteadiness on feet (R26.81);Muscle weakness (generalized) (M62.81);Difficulty in walking, not elsewhere classified (R26.2)     Time: 7573-2256 PT Time Calculation  (min) (ACUTE ONLY): 27 min  Charges:  $Gait Training: 8-22 mins                     Damar Pager [phone] Office [phone]    Alex [name] 03/07/2020, 10:29 AM

## 2020-03-07 NOTE — Progress Notes (Signed)
PROGRESS NOTE    ISSAIAH SEABROOKS  XTK:[id number] DOB: 28-Oct-1950 DOA: 03/03/2020 PCP: Antonietta Jewel, MD   Chief Complaint  Patient presents with  . Aphasia  . 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 Learta Codding, HTN, CAD, HLD, stroke, adenocarcinoma sigmoid resection colostomy, chronic anemia presented with progressive weakness fall progressive distention of abdomen.  CT abdomen in the ED showed decrease in size of distal tail of pancreas lesion, large volume ascites, metastatic lesion within the liver not confidently seen on this study compared to previous 1 and no new focal liver abnormalities, bilateral lung nodules CT head no finding. Patient was admitted underwent abdominal paracentesis with 5 L fluid removal-cytology was sent.  No evidence of SBP.Patient on empiric ceftriaxone. GI was consulted with his portal hypertension and ascites.  Was seen by Dr. Learta Codding from oncology. Patient's large-volume ascites and portal hypertension likely from underlying cirrhosis as per GI and they will follow-up as outpatient.  Subjective:  aFebrile overnight, no acute events. Feeling distended in abdomen and would like to have paracentesis. Legs still swollen but improving. Denies nausea vomiting chest pain fever chills.  Assessment & Plan:  Large volume Ascites with SAAG greater than 1.1 with evidence of portal hypertension/varices etiology of likely underlying cirrhosis uncertain.  Seen by GI, . Cont on  IV Lasix,Aldactone 50 mg ( new), monitor lytes. Repeat paracentesis to help with his abd distension- change to po lasix. > Will need to cont on low sodium diet. Off abx as  no evidence of SBP.  Cytology without evidence of malignant cells.  Staph epidermidis/Staph Hominis from port cath culture  in1 bottle, peripheral culture NGTD, repeated 10/8- NGTD.Seen by ID Dr Allean Found his immunocompromised status advised to continue  vancomycin x1 week and repeat peripheral cultures at completion of treatment.  Possible community-acquired pneumonia: less likely pneumonia,he has no respiratory symptoms.  AKI likely in the setting of ascites/decreased oral intake. Creatinine has remained stable at 1.3-1.6. Monitor, stop IV Lasix-change to po. Recent Labs  Lab 03/03/20 1238 03/04/20 0330 03/05/20 0314 03/06/20 0345 03/07/20 0410  BUN 42* 44* 47* 49* 51*  CREATININE 1.90* 1.82* 1.66* 1.36* 1.53*   Cancer of pancreas, tail: Reports he is on chemotherapy q 3 wk- last chemo was mid-September.  CT head demonstrates a decrease in size of the mass as well as metastatic lesion in the liver.  Dr. Learta Codding from oncology is following, appreciate input. Cytology from ascitic fluid negative for malignancy-oncology is planning for outpatient follow-up.  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  History of stroke/HTN/HLD: BP is stable continue his home asa,Plavix, amlodipine, atenolol,and simvastatin.  Hyponatremia sodium improving,Monitor closely. Avoid ivf 2/2 ascites. Hypokalemia: will replete with potassium chloride  Anemia: hb stable in 10 gm range. likely from chronic disease.   Nausea & vomiting: Tolerating diet.  Continue supportive measures.    Morbid obesity BMI  Body mass index is 35.12 kg/m: Will benefit with weight loss and healthy lifestyle  Nutrition: Diet Order            Diet regular Room service appropriate? Yes; Fluid consistency: Thin  Diet effective now                DVT prophylaxis: heparin injection 5,000 Units Start: 03/03/20 1600 SCDs Start: 03/03/20 1504 Place TED hose Start: 03/03/20 1504 Code Status:   Code Status: Full Code  Family Communication: plan of care discussed with patient  at bedside.  Status is: Inpatient Remains inpatient appropriate because:IV treatments appropriate due to intensity of illness or inability to take PO and Inpatient level of care  appropriate due to severity of illness for continued need of IV antibiotics.  Dispo: The patient is from: Home              Anticipated d/c is to: Home PT/OT               Anticipated d/c date is: 2 days              Patient currently is not medically stable to d/c. Consultants:see note  Procedures:see note  Culture/Microbiology    Component Value Date/Time   SDES  03/05/2020 1733    BLOOD LEFT ANTECUBITAL Performed at [facility] [address], [place], [place] [address]    SDES  03/05/2020 1733    BLOOD LEFT HAND Performed at [facility], [place] [address] [place], [place] [address]    [place]  03/05/2020 1733    BOTTLES DRAWN AEROBIC AND ANAEROBIC Blood Culture adequate volume Performed at [facility] [address] [place], [place] [address]    [place]  03/05/2020 1733    BOTTLES DRAWN AEROBIC AND ANAEROBIC Blood Culture adequate volume Performed at [facility] [address], [place], [place] [address]    CULT  03/05/2020 1733    NO GROWTH < 12 HOURS Performed at [facility] [address] [place], [place] [address]    CULT  03/05/2020 1733    NO GROWTH < 12 HOURS Performed at [facility] [address], [place], [place] [address]    REPTSTATUS PENDING 03/05/2020 1733   REPTSTATUS PENDING 03/05/2020 1733    Other culture-see note  Medications: Scheduled Meds: . amLODipine  10 mg Oral Daily  . aspirin  81 mg Oral Daily  . atenolol  25 mg Oral Daily  . Chlorhexidine Gluconate Cloth  6 each Topical Daily  . clopidogrel  75 mg Oral Daily  . docusate sodium  100 mg Oral BID  . furosemide  20 mg Intravenous Q8H  . heparin  5,000 Units Subcutaneous Q8H  . simvastatin  20 mg Oral QHS  . sodium chloride flush  10-40 mL Intracatheter Q12H  . sodium chloride flush  3 mL Intravenous Q12H  . spironolactone  50 mg Oral Daily   Continuous Infusions: . sodium  chloride Stopped (03/04/20 1854)  . vancomycin Stopped (03/06/20 2149)    Antimicrobials: Anti-infectives (From admission, onward)   Start     Dose/Rate Route Frequency Ordered Stop   03/05/20 2000  vancomycin (VANCOREADY) IVPB 1500 mg/300 mL        1,500 mg 150 mL/hr over 120 Minutes Intravenous Every 24 hours 03/05/20 1819     03/04/20 2200  vancomycin (VANCOREADY) IVPB 1250 mg/250 mL  Status:  Discontinued        1,250 mg 166.7 mL/hr over 90 Minutes Intravenous Every 24 hours 03/04/20 1155 03/05/20 1059   03/04/20 0500  cefTRIAXone (ROCEPHIN) 2 g in sodium chloride 0.9 % 100 mL IVPB  Status:  Discontinued        2 g 200 mL/hr over 30 Minutes Intravenous Every 24 hours 03/03/20 1511 03/06/20 1224   03/03/20 1500  vancomycin (VANCOREADY) IVPB 2000 mg/400 mL        2,000 mg 200 mL/hr over 120 Minutes Intravenous  Once 03/03/20 1447 03/03/20 2012  03/03/20 1445  ceFEPIme (MAXIPIME) 2 g in sodium chloride 0.9 % 100 mL IVPB        2 g 200 mL/hr over 30 Minutes Intravenous  Once 03/03/20 1435 03/03/20 1811     Objective: Vitals: Today's Vitals   03/06/20 1800 03/06/20 1949 03/06/20 2143 03/07/20 0609  BP:   120/83 136/82  Pulse:   72 79  Resp:   16 16  Temp:   98.3 F (36.8 C) 98.7 F (37.1 C)  TempSrc:   Oral Oral  SpO2:   99% 97%  Weight:      Height:      PainSc: 0-No pain 0-No pain      Intake/Output Summary (Last 24 hours) at 03/07/2020 0726 Last data filed at 03/07/2020 0600 Gross per 24 hour  Intake 1580 ml  Output 1250 ml  Net 330 ml   Filed Weights   03/03/20 1133 03/05/20 0500  Weight: 98.9 kg 98.7 kg   Weight change:   Intake/Output from previous day: 10/09 0701 - 10/10 0700 In: 2297 [P.O.:600; I.V.:280; IV Piggyback:700] Out: 1250 [Urine:1150; Stool:100] Intake/Output this shift: No intake/output data recorded.  Examination: General exam: AAOx3 , NAD, weak appearing. HEENT:Oral mucosa moist, Ear/Nose WNL grossly, dentition normal. Respiratory  system: bilaterally clear,no wheezing or crackles,no use of accessory muscle Cardiovascular system: S1 & S2 +, No JVD,. Gastrointestinal system: Abdomen soft, very distended, BS+.  Colostomy in place with stool Nervous System:Alert, awake, moving extremities and grossly nonfocal Extremities: ankle edema+, distal peripheral pulses palpable.  Skin: No rashes,no icterus. MSK: Normal muscle bulk,tone, power  Data Reviewed: I have personally reviewed following labs and imaging studies CBC: Recent Labs  Lab 03/03/20 1238 03/04/20 0330 03/05/20 0314 03/06/20 0345 03/07/20 0410  WBC 13.4* 9.1 6.7 7.7 9.1  NEUTROABS 9.7*  --   --   --   --   HGB 13.1 10.7* 9.7* 10.1* 10.8*  HCT 40.4 32.9* 29.5* 31.1* 32.4*  MCV 92.7 92.9 91.0 91.2 90.8  PLT 255 151 118* 121* 989*   Basic Metabolic Panel: Recent Labs  Lab 03/03/20 1237 03/03/20 1237 03/03/20 1238 03/04/20 0330 03/05/20 0314 03/06/20 0345 03/07/20 0410  NA  --   --  131* 130* 133* 136 133*  K  --   --  5.1 4.8 3.9 3.4* 3.4*  CL  --   --  100 100 101 102 100  CO2  --   --  23 22 24 23 24   GLUCOSE  --   --  158* 107* 102* 81 101*  BUN  --   --  42* 44* 47* 49* 51*  CREATININE  --   --  1.90* 1.82* 1.66* 1.36* 1.53*  CALCIUM 8.0*   < > 7.8* 7.6* 7.7* 7.7* 7.7*  MG 2.9*  --   --   --   --   --   --   PHOS 3.9  --   --   --   --   --   --    < > = values in this interval not displayed.   GFR: Estimated Creatinine Clearance: 50.8 mL/min (A) (by C-G formula based on SCr of 1.53 mg/dL (H)). Liver Function Tests: Recent Labs  Lab 03/03/20 1238 03/07/20 0410  AST 61* 45*  ALT 34 23  ALKPHOS 167* 118  BILITOT 1.6* 0.9  PROT 6.3* 5.6*  ALBUMIN 2.5* 2.8*   No results for input(s): LIPASE, AMYLASE in the last 168 hours. No results for input(s): AMMONIA  in the last 168 hours. Coagulation Profile: Recent Labs  Lab 03/03/20 1504  INR 1.3*   Cardiac Enzymes: No results for input(s): CKTOTAL, CKMB, CKMBINDEX, TROPONINI in the  last 168 hours. BNP (last 3 results) No results for input(s): PROBNP in the last 8760 hours. HbA1C: No results for input(s): HGBA1C in the last 72 hours. CBG: Recent Labs  Lab 03/04/20 0750 03/05/20 0740 03/06/20 0734  GLUCAP 99 88 87   Lipid Profile: No results for input(s): CHOL, HDL, LDLCALC, TRIG, CHOLHDL, LDLDIRECT in the last 72 hours. Thyroid Function Tests: No results for input(s): TSH, T4TOTAL, FREET4, T3FREE, THYROIDAB in the last 72 hours. Anemia Panel: No results for input(s): VITAMINB12, FOLATE, FERRITIN, TIBC, IRON, RETICCTPCT in the last 72 hours. Sepsis Labs: No results for input(s): PROCALCITON, LATICACIDVEN in the last 168 hours.  Recent Results (from the past 240 hour(s))  Respiratory Panel by RT PCR (Flu A&B, Covid) - Nasopharyngeal Swab     Status: None   Collection Time: 03/03/20  2:18 PM   Specimen: Nasopharyngeal Swab  Result Value Ref Range Status   SARS Coronavirus 2 by RT PCR NEGATIVE NEGATIVE Final    Comment: (NOTE) SARS-CoV-2 target nucleic acids are NOT DETECTED.  The SARS-CoV-2 RNA is generally detectable in upper respiratoy specimens during the acute phase of infection. The lowest concentration of SARS-CoV-2 viral copies this assay can detect is 131 copies/mL. A negative result does not preclude SARS-Cov-2 infection and should not be used as the sole basis for treatment or other patient management decisions. A negative result may occur with  improper specimen collection/handling, submission of specimen other than nasopharyngeal swab, presence of viral mutation(s) within the areas targeted by this assay, and inadequate number of viral copies (<131 copies/mL). A negative result must be combined with clinical observations, patient history, and epidemiological information. The expected result is Negative.  Fact Sheet for Patients:  PinkCheek.be  Fact Sheet for Healthcare Providers:    GravelBags.it  This test is no t yet approved or cleared by the [place] FDA and  has been authorized for detection and/or diagnosis of SARS-CoV-2 by FDA under an Emergency Use Authorization (EUA). This EUA will remain  in effect (meaning this test can be used) for the duration of the COVID-19 declaration under Section 564(b)(1) of the Act, 21 U.S.C. section 360bbb-3(b)(1), unless the authorization is terminated or revoked sooner.     Influenza A by PCR NEGATIVE NEGATIVE Final   Influenza B by PCR NEGATIVE NEGATIVE Final    Comment: (NOTE) The Xpert Xpress SARS-CoV-2/FLU/RSV assay is intended as an aid in  the diagnosis of influenza from Nasopharyngeal swab specimens and  should not be used as a sole basis for treatment. Nasal washings and  aspirates are unacceptable for Xpert Xpress SARS-CoV-2/FLU/RSV  testing.  Fact Sheet for Patients: PinkCheek.be  Fact Sheet for Healthcare Providers: GravelBags.it  This test is not yet approved or cleared by the [place] FDA and  has been authorized for detection and/or diagnosis of SARS-CoV-2 by  FDA under an Emergency Use Authorization (EUA). This EUA will remain  in effect (meaning this test can be used) for the duration of the  Covid-19 declaration under Section 564(b)(1) of the Act, 21  U.S.C. section 360bbb-3(b)(1), unless the authorization is  terminated or revoked. Performed at [facility], [place] [address], [place], [place] [address]   Body fluid culture     Status: None (Preliminary result)   Collection Time: 03/03/20  4:38 PM  Specimen: PATH Cytology Peritoneal fluid  Result Value Ref Range Status   Specimen Description   Final    PERITONEAL Performed at [facility] [address], [place], [place] [address]    Special Requests   Final    NONE Performed at [facility],  [place] [address] [place], [place] [address]    Gram Stain   Final    WBC PRESENT, PREDOMINANTLY MONONUCLEAR NO ORGANISMS SEEN CYTOSPIN SMEAR Gram Stain Report Called to,Read Back By and Verified With: C.SIMPSON AT 1848 ON 03/03/20 BY N.THOMPSON Performed at [facility], [place] [address], [place], [place] [address]    Culture   Final    NO GROWTH 2 DAYS Performed at [facility] [address] [place], [place] [address]    Report Status PENDING  Incomplete  Aerobic Culture (superficial specimen)     Status: None   Collection Time: 03/03/20  4:38 PM   Specimen: PATH Cytology Peritoneal fluid  Result Value Ref Range Status   Specimen Description   Final    PERITONEAL Performed at [facility] [address], [place], [place] [address]    Special Requests   Final    NONE Performed at [facility], [place] [address], [place], [place] [address]    Gram Stain NO WBC SEEN NO ORGANISMS SEEN   Final   Culture   Final    NO GROWTH 2 DAYS Performed at [facility], [place] [address], [place], [place] [address]    Report Status 03/06/2020 FINAL  Final  Blood culture (routine x 2)     Status: Abnormal   Collection Time: 03/03/20  4:49 PM   Specimen: BLOOD  Result Value Ref Range Status   Specimen Description   Final    BLOOD PORTA CATH RIGHT CHEST Performed at [facility] [address] [place], [place] [address]    Special Requests   Final    BOTTLES DRAWN AEROBIC AND ANAEROBIC Blood Culture adequate volume Performed at [facility] [address], [place], [place] [address]    Culture  Setup Time   Final    GRAM POSITIVE COCCI IN CLUSTERS IN BOTH AEROBIC AND ANAEROBIC BOTTLES CRITICAL RESULT CALLED TO, READ BACK BY AND VERIFIED WITH: PHARMD C.SHADE AT 1100 ON 03/04/2020 BY T.SAAD    Culture (A)  Final    STAPHYLOCOCCUS HOMINIS STAPHYLOCOCCUS EPIDERMIDIS THE SIGNIFICANCE  OF ISOLATING THIS ORGANISM FROM A SINGLE SET OF BLOOD CULTURES WHEN MULTIPLE SETS ARE DRAWN IS UNCERTAIN. PLEASE NOTIFY THE MICROBIOLOGY DEPARTMENT WITHIN ONE WEEK IF SPECIATION AND SENSITIVITIES ARE REQUIRED. Performed at [facility], [place] [address], [place], [place] [address]    Report Status 03/06/2020 FINAL  Final  Blood Culture ID Panel (Reflexed)     Status: Abnormal   Collection Time: 03/03/20  4:49 PM  Result Value Ref Range Status   Enterococcus faecalis NOT DETECTED NOT DETECTED Final   Enterococcus Faecium NOT DETECTED NOT DETECTED Final   Listeria monocytogenes NOT DETECTED NOT DETECTED Final   Staphylococcus species DETECTED (A) NOT DETECTED Final    Comment: CRITICAL RESULT CALLED TO, READ BACK BY AND VERIFIED WITH: PHARMD C.SHADE AT 1100 ON 03/04/2020 BY T.SAAD    Staphylococcus aureus (BCID) NOT DETECTED NOT DETECTED Final   Staphylococcus epidermidis DETECTED (A) NOT DETECTED Final    Comment: Methicillin (oxacillin) resistant coagulase negative staphylococcus. Possible blood culture contaminant (unless isolated  from more than one blood culture draw or clinical case suggests pathogenicity). No antibiotic treatment is indicated for blood  culture contaminants. CRITICAL RESULT CALLED TO, READ BACK BY AND VERIFIED WITH: PHARMD C.SHADE AT 1100 ON 03/04/2020 BY T.SAAD    Staphylococcus lugdunensis NOT DETECTED NOT DETECTED Final   Streptococcus species NOT DETECTED NOT DETECTED Final   Streptococcus agalactiae NOT DETECTED NOT DETECTED Final   Streptococcus pneumoniae NOT DETECTED NOT DETECTED Final   Streptococcus pyogenes NOT DETECTED NOT DETECTED Final   A.calcoaceticus-baumannii NOT DETECTED NOT DETECTED Final   Bacteroides fragilis NOT DETECTED NOT DETECTED Final   Enterobacterales NOT DETECTED NOT DETECTED Final   Enterobacter cloacae complex NOT DETECTED NOT DETECTED Final   Escherichia coli NOT DETECTED NOT DETECTED Final   Klebsiella aerogenes NOT DETECTED  NOT DETECTED Final   Klebsiella oxytoca NOT DETECTED NOT DETECTED Final   Klebsiella pneumoniae NOT DETECTED NOT DETECTED Final   Proteus species NOT DETECTED NOT DETECTED Final   Salmonella species NOT DETECTED NOT DETECTED Final   Serratia marcescens NOT DETECTED NOT DETECTED Final   Haemophilus influenzae NOT DETECTED NOT DETECTED Final   Neisseria meningitidis NOT DETECTED NOT DETECTED Final   Pseudomonas aeruginosa NOT DETECTED NOT DETECTED Final   Stenotrophomonas maltophilia NOT DETECTED NOT DETECTED Final   Candida albicans NOT DETECTED NOT DETECTED Final   Candida auris NOT DETECTED NOT DETECTED Final   Candida glabrata NOT DETECTED NOT DETECTED Final   Candida krusei NOT DETECTED NOT DETECTED Final   Candida parapsilosis NOT DETECTED NOT DETECTED Final   Candida tropicalis NOT DETECTED NOT DETECTED Final   Cryptococcus neoformans/gattii NOT DETECTED NOT DETECTED Final   Methicillin resistance mecA/C DETECTED (A) NOT DETECTED Final    Comment: CRITICAL RESULT CALLED TO, READ BACK BY AND VERIFIED WITH: PHARMD C.SHADE AT 1100 ON 03/04/2020 BY T.SAAD Performed at [facility], [place] [address] [place], [place] [address]   Blood culture (routine x 2)     Status: None (Preliminary result)   Collection Time: 03/03/20  5:09 PM   Specimen: BLOOD  Result Value Ref Range Status   Specimen Description   Final    BLOOD LEFT ANTECUBITAL Performed at [facility] [address], [place], [place] [address]    Special Requests   Final    BOTTLES DRAWN AEROBIC AND ANAEROBIC Blood Culture adequate volume Performed at [facility] [address] [place], [place] [address]    Culture   Final    NO GROWTH 3 DAYS Performed at [facility], [place] [address], [place], [place] [address]    Report Status PENDING  Incomplete  Culture, blood (Routine X 2) w Reflex to ID Panel     Status: None (Preliminary result)   Collection Time: 03/05/20  5:33  PM   Specimen: BLOOD  Result Value Ref Range Status   Specimen Description   Final    BLOOD LEFT ANTECUBITAL Performed at [facility] [address] [place], [place] [address]    Special Requests   Final    BOTTLES DRAWN AEROBIC AND ANAEROBIC Blood Culture adequate volume Performed at [facility] [address], [place], [place] [address]    Culture   Final    NO GROWTH < 12 HOURS Performed at [facility] [address] [place], [place] [address]    Report Status PENDING  Incomplete  Culture, blood (Routine X 2) w Reflex to ID Panel  Status: None (Preliminary result)   Collection Time: 03/05/20  5:33 PM   Specimen: BLOOD LEFT HAND  Result Value Ref Range Status   Specimen Description   Final    BLOOD LEFT HAND Performed at [facility] [address], [place], [place] [address]    Special Requests   Final    BOTTLES DRAWN AEROBIC AND ANAEROBIC Blood Culture adequate volume Performed at [facility] [address], [place], [name] [address]    Culture   Final    NO GROWTH < 12 HOURS Performed at [facility] [address], [place], [place] [address]    Report Status PENDING  Incomplete     Radiology Studies: No results found.   LOS: 4 days   Antonieta Pert, MD [facility]  03/07/2020, 7:26 AM

## 2020-03-08 ENCOUNTER — Other Ambulatory Visit: Payer: Self-pay

## 2020-03-08 DIAGNOSIS — C252 Malignant neoplasm of tail of pancreas: Secondary | ICD-10-CM | POA: Diagnosis not present

## 2020-03-08 DIAGNOSIS — R188 Other ascites: Secondary | ICD-10-CM | POA: Diagnosis not present

## 2020-03-08 LAB — CBC
HCT: 31.7 % — ABNORMAL LOW (ref 39.0–52.0)
Hemoglobin: 10.4 g/dL — ABNORMAL LOW (ref 13.0–17.0)
MCH: 30.1 pg (ref 26.0–34.0)
MCHC: 32.8 g/dL (ref 30.0–36.0)
MCV: 91.6 fL (ref 80.0–100.0)
Platelets: 117 10*3/uL — ABNORMAL LOW (ref 150–400)
RBC: 3.46 MIL/uL — ABNORMAL LOW (ref 4.22–5.81)
RDW: 19.6 % — ABNORMAL HIGH (ref 11.5–15.5)
WBC: 8.7 10*3/uL (ref 4.0–10.5)
nRBC: 0 % (ref 0.0–0.2)

## 2020-03-08 LAB — COMPREHENSIVE METABOLIC PANEL
ALT: 22 U/L (ref 0–44)
AST: 42 U/L — ABNORMAL HIGH (ref 15–41)
Albumin: 2.6 g/dL — ABNORMAL LOW (ref 3.5–5.0)
Alkaline Phosphatase: 113 U/L (ref 38–126)
Anion gap: 9 (ref 5–15)
BUN: 51 mg/dL — ABNORMAL HIGH (ref 8–23)
CO2: 23 mmol/L (ref 22–32)
Calcium: 7.6 mg/dL — ABNORMAL LOW (ref 8.9–10.3)
Chloride: 100 mmol/L (ref 98–111)
Creatinine, Ser: 1.47 mg/dL — ABNORMAL HIGH (ref 0.61–1.24)
GFR, Estimated: 48 mL/min — ABNORMAL LOW (ref >60)
Glucose, Bld: 106 mg/dL — ABNORMAL HIGH (ref 70–99)
Potassium: 3.5 mmol/L (ref 3.5–5.1)
Sodium: 132 mmol/L — ABNORMAL LOW (ref 135–145)
Total Bilirubin: 0.9 mg/dL (ref 0.3–1.2)
Total Protein: 5.4 g/dL — ABNORMAL LOW (ref 6.5–8.1)

## 2020-03-08 LAB — CULTURE, BLOOD (ROUTINE X 2)
Culture: NO GROWTH
Special Requests: ADEQUATE

## 2020-03-08 LAB — VANCOMYCIN, TROUGH: Vancomycin Tr: 30 ug/mL (ref 15–20)

## 2020-03-08 LAB — GLUCOSE, CAPILLARY: Glucose-Capillary: 88 mg/dL (ref 70–99)

## 2020-03-08 MED ORDER — VANCOMYCIN HCL IN DEXTROSE 1-5 GM/200ML-% IV SOLN
1000.0000 mg | INTRAVENOUS | Status: DC
  Administered 2020-03-09: 1000 mg via INTRAVENOUS
  Filled 2020-03-08: qty 200

## 2020-03-08 NOTE — Progress Notes (Signed)
Physical Therapy Treatment Patient Details Name: Alex [name] MRN: [id number] DOB: 08/23/1950 Today's Date: 03/08/2020    History of Present Illness Pt admitted with weakness and abdominal distention 2* Ascites in the setting of Pancreatic CA with liver mets.  Pt with hx of partial colectomy, colostomy and CVA (2010)  - pt states largely affected speech     PT Comments    Pt in good spirits, mobilizing better with each day and hoping for dc home tomorrow.     Follow Up Recommendations  Home health PT     Equipment Recommendations  Rolling walker with 5" wheels    Recommendations for Other Services       Precautions / Restrictions Precautions Precautions: Fall Restrictions Weight Bearing Restrictions: No    Mobility  Bed Mobility Overal bed mobility: Needs Assistance Bed Mobility: Supine to Sit     Supine to sit: Modified independent (Device/Increase time)     General bed mobility comments: use of bed rail to move to sitting but no physical assist  Transfers Overall transfer level: Needs assistance Equipment used: Rolling walker (2 wheeled) Transfers: Sit to/from Stand Sit to Stand: Supervision         General transfer comment: Pt self cues for use of UEs to self assist  Ambulation/Gait Ambulation/Gait assistance: Min guard Gait Distance (Feet): 450 Feet Assistive device: Rolling walker (2 wheeled) Gait Pattern/deviations: Step-through pattern;Decreased step length - right;Decreased step length - left;Steppage;Trunk flexed Gait velocity: decr   General Gait Details: cues for posture and position from [employer] Mobility    Modified Rankin (Stroke Patients Only)       Balance Overall balance assessment: Independent Sitting-balance support: No upper extremity supported;Feet supported Sitting balance-Leahy Scale: Good     Standing balance support: No upper extremity supported Standing balance-Leahy Scale:  Fair                              Cognition Arousal/Alertness: Awake/alert Behavior During Therapy: WFL for tasks assessed/performed Overall Cognitive Status: Within Functional Limits for tasks assessed                                        Exercises      General Comments        Pertinent Vitals/Pain Pain Assessment: No/denies pain    Home Living                      Prior Function            PT Goals (current goals can now be found in the care plan section) Acute Rehab PT Goals Patient Stated Goal: Regain IND  PT Goal Formulation: With patient Time For Goal Achievement: 03/18/20 Potential to Achieve Goals: Good Progress towards PT goals: Progressing toward goals    Frequency    Min 3X/week      PT Plan Current plan remains appropriate    Co-evaluation              AM-PAC PT "6 Clicks" Mobility   Outcome Measure  Help needed turning from your back to your side while in a flat bed without using bedrails?: None Help needed moving from lying on your back to sitting on the side of a  flat bed without using bedrails?: A Little Help needed moving to and from a bed to a chair (including a wheelchair)?: A Little Help needed standing up from a chair using your arms (e.g., wheelchair or bedside chair)?: A Little Help needed to walk in hospital room?: A Little Help needed climbing 3-5 steps with a railing? : A Little 6 Click Score: 19    End of Session Equipment Utilized During Treatment: Gait belt Activity Tolerance: Patient tolerated treatment well Patient left: in chair;with call bell/phone within reach;with chair alarm set Nurse Communication: Mobility status PT Visit Diagnosis: Unsteadiness on feet (R26.81);Muscle weakness (generalized) (M62.81);Difficulty in walking, not elsewhere classified (R26.2)     Time: 2060-1561 PT Time Calculation (min) (ACUTE ONLY): 20 min  Charges:  $Gait Training: 8-22 mins                      [place] Pager [phone] Office [phone]    Castiel Lauricella 03/08/2020, 9:28 AM

## 2020-03-08 NOTE — TOC Initial Note (Addendum)
Transition of Care [facility]) - Initial/Assessment Note    Patient Details  Name: Alex [name] MRN: [id number] Date of Birth: 1951-01-22  Transition of Care [facility]) CM/SW Contact:    Lennart Pall, LCSW Phone Number: 03/08/2020, 3:06 PM  Clinical Narrative:                 Met with pt to review potential dc needs.  He confirms that he lives with his son who provides 24/7 assistance.  He is aware that PT has recommended HHPT follow up and rolling walker and is agreeable. Referrals made - see below.  Pt denies any concerns about dc and hopeful this may happen tomorrow.  TOC will continue to follow.  Expected Discharge Plan: [place] Barriers to Discharge: Continued Medical Work up   Patient Goals and CMS Choice Patient states their goals for this hospitalization and ongoing recovery are:: return home CMS Medicare.gov Compare Post Acute Care list provided to:: Patient Choice offered to / list presented to : Patient  Expected Discharge Plan and Services Expected Discharge Plan: [place] In-house Referral: Clinical Social Work   Post Acute Care Choice: Home Health, Durable Medical Equipment Living arrangements for the past 2 months: [place]                 DME Arranged: Walker rolling DME Agency: [employer] Date DME Agency Contacted: 03/08/20 Time DME Agency Contacted: 59 Representative spoke with at DME Agency: Freda Munro HH Arranged: PT [place] Agency: [facility] Date [place]: 03/08/20 Time [place]: 64 Representative spoke with at [place]: [place]  Prior Living Arrangements/Services Living arrangements for the past 2 months: [place] with:: Adult Children Patient language and need for interpreter reviewed:: Yes Do you feel safe going back to the place where you live?: Yes      Need for Family Participation in Patient Care: Yes (Comment) Care giver support system in place?: Yes  (comment)   Criminal Activity/Legal Involvement Pertinent to Current Situation/Hospitalization: No - Comment as needed  Activities of Daily Living Home Assistive Devices/Equipment: None ADL Screening (condition at time of admission) Patient's cognitive ability adequate to safely complete daily activities?: Yes Is the patient deaf or have difficulty hearing?: No Does the patient have difficulty seeing, even when wearing glasses/contacts?: No Does the patient have difficulty concentrating, remembering, or making decisions?: No Patient able to express need for assistance with ADLs?: Yes Does the patient have difficulty dressing or bathing?: No Independently performs ADLs?: Yes (appropriate for developmental age) Does the patient have difficulty walking or climbing stairs?: No Weakness of Legs: None Weakness of Arms/Hands: None  Permission Sought/Granted   Permission granted to share information with : Yes, Verbal Permission Granted  Share Information with NAME: Kamran Coker     Permission granted to share info w Relationship: son  Permission granted to share info w Contact Information: [phone]  Emotional Assessment Appearance:: Appears stated age Attitude/Demeanor/Rapport: Gracious, Engaged Affect (typically observed): Accepting, Pleasant Orientation: : Oriented to Self, Oriented to Place, Oriented to  Time, Oriented to Situation Alcohol / Substance Use: Not Applicable Psych Involvement: No (comment)  Admission diagnosis:  Weakness [R53.1] Malignant ascites [R18.0] AKI (acute kidney injury) ([place]) [N17.9] Ascites [R18.8] Community acquired pneumonia, unspecified laterality [J18.9] Patient Active Problem List   Diagnosis Date Noted  . AKI (acute kidney injury) ([place])   . Bacteremia   . Ascites 03/03/2020  . Port-A-Cath in place 10/11/2018  .  Goals of care, counseling/discussion 10/04/2018  . Cancer of pancreas, tail ([place]) 10/04/2018  . Genetic testing 08/26/2018  .  Malignant tumor of sigmoid colon ([place])   . Colon obstruction ([place])   . Abdominal distension 09/14/2017  . Nausea & vomiting 09/14/2017  . Hypokalemia 09/14/2017  . HLD (hyperlipidemia) 09/14/2017  . Nausea and vomiting 09/14/2017  . Dehydration   . Empyema of right pleural space ([place]) 08/30/2017  . Lobar pneumonia ([place]) 08/27/2017  . Empyema ([place]) 08/27/2017  . Pleural effusion, right 08/27/2017  . Pulmonary nodule, right 08/27/2017  . Benign essential HTN 08/27/2017  . Aortic atherosclerosis ([place]) 08/27/2017  . Anemia 08/26/2017  . Pleural effusion 08/25/2017  . Community acquired pneumonia of right lower lobe of lung 08/04/2017  . History of stroke 08/04/2017  . Occlusion and stenosis of carotid artery without mention of cerebral infarction 09/07/2011   PCP:  Antonietta Jewel, MD Pharmacy:   [facility] - [address] [place] [address] [place] [address] Phone: [phone] Fax: [phone]     Social Determinants of Health (SDOH) Interventions    Readmission Risk Interventions Readmission Risk Prevention Plan 03/08/2020  Transportation Screening Complete  PCP or Specialist Appt within 3-5 Days Complete  HRI or [place] Complete  Social Work Consult for [place] Planning/Counseling Complete  Some recent data might be hidden

## 2020-03-08 NOTE — Progress Notes (Addendum)
IP PROGRESS NOTE  Subjective: The patient underwent repeat paracentesis on 03/07/2020. Abdomen remains distended, but he reports overall his discomfort has improved. Continues to ambulate without difficulty. He continues to have neuropathy in his feet.  Objective: Vital signs in last 24 hours: Temp:  [97.5 F (36.4 C)-98 F (36.7 C)] 98 F (36.7 C) (10/11 0602) Pulse Rate:  [66-72] 72 (10/11 0602) Resp:  [18] 18 (10/11 0602) BP: (117-131)/(73-80) 131/77 (10/11 0602) SpO2:  [97 %-99 %] 97 % (10/11 0602)  Intake/Output from previous day: 10/10 0701 - 10/11 0700 In: 1160.7 [P.O.:720; I.V.:140.6; IV Piggyback:300.1] Out: 1517 [Urine:1200; Stool:450] Intake/Output this shift: Total I/O In: 240 [P.O.:240] Out: 400 [Urine:400]  HEENT: No thrush. Resp: Lungs clear anteriorly Cardio: Regular rate and rhythm. GI: Abdomen is markedly distended consistent with ascites.  Left lower quadrant colostomy Vascular: Pitting edema lower legs bilaterally. Port-A-Cath without erythema.  Lab Results:  Recent Labs    03/07/20 0410 03/08/20 0326  WBC 9.1 8.7  HGB 10.8* 10.4*  HCT 32.4* 31.7*  PLT 128* 117*   BMET Recent Labs    03/07/20 0410 03/08/20 0326  NA 133* 132*  K 3.4* 3.5  CL 100 100  CO2 24 23  GLUCOSE 101* 106*  BUN 51* 51*  CREATININE 1.53* 1.47*  CALCIUM 7.7* 7.6*    Studies/Results: US Paracentesis  Result Date: 03/07/2020 INDICATION: Patient with a history of pancreatic cancer and recurrent large volume ascites. Interventional radiology asked to perform a therapeutic and diagnostic paracentesis. EXAM: ULTRASOUND GUIDED  PARACENTESIS MEDICATIONS: 1% lidocaine 10 mL COMPLICATIONS: None immediate. PROCEDURE: Informed written consent was obtained from the patient after a discussion of the risks, benefits and alternatives to treatment. A timeout was performed prior to the initiation of the procedure. Initial ultrasound scanning demonstrates a large amount of ascites  within the right lower abdominal quadrant. The right lower abdomen was prepped and draped in the usual sterile fashion. 1% lidocaine was used for local anesthesia. Following this, a 19 gauge, 7-cm, Yueh catheter was introduced. An ultrasound image was saved for documentation purposes. The paracentesis was performed. The catheter was inadvertently removed midway through the procedure. Ultrasound imaging demonstrated additional fluid. The area was prepped and draped again in the usual sterile fashion and another Teressa Lower was reinserted. The remainder of the fluid was drained without further complication. The catheter was removed and a dressing was applied. The patient tolerated the procedure well without immediate post procedural complication. FINDINGS: A total of approximately 3.4 L of clear yellow fluid was removed. Samples were sent to the laboratory as requested by the clinical team. IMPRESSION: Successful ultrasound-guided paracentesis yielding 3.4 liters of peritoneal fluid. Read by: Soyla Dryer, NP Electronically Signed   By: Lucrezia Europe M.D.   On: 03/07/2020 13:19    Medications: reviewed  Assessment/Plan: 1. 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 Well-differentiated adenocarcinoma, 0/13 lymph nodes positive, MSI-stable, no loss of mismatch repair protein expression ? Tumor involving the rectosigmoid junction ? Sigmoidoscopy 09/18/2017-distal sigmoid colon tumor beginning between 15 and 20 cm from the dentate line, completely obstructing ? CT abdomen/pelvis 09/14/2017-sigmoid thickening no evidence of metastatic disease ? 2 mm right upper lobe nodule on CT 08/04/2017 ? Colonoscopy 02/07/2018-polyps removed from the cecum, ascending colon, transverse colon, descending colon, rectosigmoid colon and rectum (tubular adenomas)  2. Right lung pneumonia/empyema March 2019  Right VATS, drainage of empyema, and  decortication of the right lower lobe 08/31/2017  3.CVA  in 2010  4.Rectal and active sigmoid polyps noted on flexible sigmoidoscopy 09/18/2017  5.Pancreas tail mass/right liver lesions and 4 mm splenic lesion on CT abdomen/pelvis 05/27/2018  MRI abdomen 07/27/2018-right liver lesions, rim-enhancing lesion in the pancreas tail  Ultrasound-guided biopsy of a right liver lesion 08/14/2018-metastatic adenocarcinoma, cytokeratin 7+, CDX-2 weak positive, cytokeratin 20 negative, consistent with metastatic pancreas cancer; MSI stable; tumor mutational burden 0  CT abdomen/pelvis 09/26/2018-increased size of previously noted liver lesions, new right liver lesion, 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  Cycle 6 gemcitabine/Abraxane 12/20/2018  Cycle 7 gemcitabine/Abraxane 01/03/2019  Cycle 8 gemcitabine/Abraxane 01/17/2019  Cycle 9 gemcitabine/Abraxane 01/31/2019  Cycle 10 gemcitabine/Abraxane 02/13/2019  Cycle 11 gemcitabine/Abraxane 02/28/2019 (Abraxane held secondary to neuropathy symptoms)  Cycle 12 gemcitabine/Abraxane 03/14/2019 (Abraxane held due to persistent neuropathy symptoms)  Cycle 13 gemcitabine/Abraxane 03/28/2019 (Abraxane held due to persistent neuropathy symptoms)  Cycle 14 gemcitabine/Abraxane 04/11/2019 (Abraxane held due to persistent neuropathy symptoms)  CT abdomen/pelvis 04/28/2019-decrease in size of pancreatic mass. Decrease in size of hepatic metastatic foci.   Cycle 15 gemcitabine12/05/2018(Abraxane held due to neuropathy symptoms)  Cycle 16 gemcitabine 05/16/2019 (Abraxane held due to  neuropathy symptoms)  Cycle 17 gemcitabine 05/31/2019 (Abraxane on hold due to neuropathy symptoms)  Cycle 18 gemcitabine 06/13/2019 (Abraxane held)  Cycle 19 gemcitabine 06/27/2019 (Abraxane held)  Cycle 20 gemcitabine 07/11/2019 (Abraxane held)  Cycle 21 gemcitabine 07/25/2019 (Abraxane held)  CTabdomen/pelvis 08/05/2019-stable small liver lesions, stable pancreas mass, no evidence of disease progression  Cycle 22 gemcitabine 08/08/2019 (Abraxane held)  Cycle 23 gemcitabine 08/22/2019 (Abraxane held)  Cycle 24 gemcitabine 09/05/2019 (Abraxane held)  Cycle 25 gemcitabine 09/19/2019 (Abraxane held)  CT abdomen/pelvis 09/30/2019-no change in pancreas mass, slight enlargement of right liver metastases, largest measuring 1 cm, no new lesions  Cycle 26 gemcitabine/Abraxane 10/03/2019-Abraxane resumed at a dose of 100 mg/m  Cycle 27 gemcitabine/Abraxane 10/17/2019  Cycle 28 gemcitabine/Abraxane 10/31/2019  Cycle 29 gemcitabine/Abraxane 11/14/2019  Cycle 30 gemcitabine/Abraxane 11/28/2019  Cycle 31 gemcitabine/Abraxane 12/12/2019  CT abdomen/pelvis 12/25/2019-stable right hepatic lesion, no new liver lesions, stable pancreas tail mass, tiny bilateral lung base nodules, new compared to a chest CT 09/25/2017 and some have progressed compared to a's CT abdomen 08/05/2019  Cycle 32 gemcitabine/Abraxane 01/02/2020 (chemotherapy dose reduced due to neutropenia)  Cycle 33 gemcitabine/Abraxane 01/23/2020  Cycle 34 gemcitabine 02/13/2020 (Abraxane held secondary to neuropathy)  CT abdomen/pelvis 03/03/2020-no significant change in the appearance of tiny bilateral lung nodules at the lower lung zones.  Previously characterized subcapsular metastatic liver lesion not seen on current study.  No new focal liver abnormality.  No biliary ductal dilatation.  Index lesion distal tail of pancreas smaller.  Interval development of large volume ascites within the abdomen and pelvis.  No discrete peritoneal nodule.  Portal vein  patent.  Esophageal and gastric varices identified similar to prior exam.  6.Port-A-Cath placement by Dr. 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  Hospitalization 03/03/2020 with weakness, marked abdominal distention   CT abdomen/pelvis 03/03/2020-no significant change in the appearance of tiny bilateral lung nodules at the lower lung zones.  Previously characterized subcapsular metastatic liver lesion not seen on current study.  No new  focal liver abnormality.  No biliary ductal dilatation.  Index lesion distal tail of pancreas smaller.  Interval development of large volume ascites within the abdomen and pelvis.  No discrete peritoneal nodule.  Portal vein patent.  Esophageal and gastric varices identified similar to prior exam.  Brain CT 03/03/2020-negative  Paracentesis 03/04/2019 21-5 L of fluid removed.  Cytology negative.  10.  1 set of blood cultures from the Port-A-Cath positive for staph epidermidis-treated with course of vancomycin 11.  Mild thrombocytopenia-likely secondary to chemotherapy  [name] appears stable today.  He has persistent abdominal distention. Cytology from initial paracentesis performed on 03/03/2020 was negative for malignancy. Repeat cytology from 03/07/2020 is pending. He has no clear-cut evidence of pancreatic cancer progression. The patient has known liver cirrhosis and ascites likely related to his underlying liver disease.  The patient is stable from our standpoint to be discharged to home. We will arrange for outpatient follow-up at the cancer center.  Recommendations: 1. Management of ascites/portal hypertension per the gastroenterology service 2. Scheduling message sent to cancer center to arrange for outpatient follow-up on 10/20.   LOS: 5 days    Mikey Bussing 03/08/2020 10:40 AM  Mr. Reali was interviewed and examined.  He has an  improved performance status compared to the day of admission.  There has been no confirmation of progressive pancreas cancer.  The plan is to continue gemcitabine based chemotherapy as an outpatient.  We will follow up on the ascitic fluid cytology from yesterday.  Julieanne Manson, MD

## 2020-03-08 NOTE — Progress Notes (Signed)
Brief pharmacy antibiotic note  Vancomycin trough=30, supratherapeutic (goal 15-20)  Plan: Decrease vancomycin to 1gm IV q24h, tonights dose already given, will start 36 hours from then Obtain vanc trough at steady state F/u Scr and LOT  Dolly Rias RPh 03/08/2020, 1:39 AM

## 2020-03-08 NOTE — Care Management Important Message (Signed)
Important Message  Patient Details IM Letter given to the Patient Name: Alex [name] MRN: [id number] Date of Birth: Jun 10, 1950   Medicare Important Message Given:  Yes     Kerin Salen 03/08/2020, 12:18 PM

## 2020-03-08 NOTE — Progress Notes (Signed)
PROGRESS NOTE    Alex [name]  QQV:[id number] DOB: 25-Feb-1951 DOA: 03/03/2020 PCP: Antonietta Jewel, MD   Chief Complaint  Patient presents with  . Aphasia  . 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 Learta Codding, HTN, CAD, HLD, stroke, adenocarcinoma sigmoid resection colostomy, chronic anemia presented with progressive weakness fall progressive distention of abdomen.  CT abdomen in the ED showed decrease in size of distal tail of pancreas lesion, large volume ascites, metastatic lesion within the liver not confidently seen on this study compared to previous 1 and no new focal liver abnormalities, bilateral lung nodules CT head no finding. Patient was admitted underwent abdominal paracentesis with 5 L fluid removal-cytology was sent.  No evidence of SBP.Patient on empiric ceftriaxone. GI was consulted with his portal hypertension and ascites.  Was seen by Dr. Learta Codding from oncology. Patient's large-volume ascites and portal hypertension likely from underlying cirrhosis as per GI and they will follow-up as outpatient. 10/9 repeat paracentesis with 3.8 L fluid removal  Subjective: Seen this morning.  Resting comfortably in the bedside chair. Vanco trough high at 30 and vancomycin is on hold for next 36 hours afebrile overnight, creatinine stable 1.47 Patient feels much improved after second paracentesis.  Assessment & Plan:  Large volume Ascites with SAAG greater than 1.1 with evidence of portal hypertension/varices etiology of likely underlying cirrhosis uncertain.  Seen by GI. S/p paracentesis w/ fluid removal:5 L on 10/6 and 3.8 L on 10/9. Cont po lasix, po aldactone. Off abx as  no evidence of SBP.Cytology without evidence of malignant cells, repeat cytology pending.  Staph epidermidis/Staph Hominis from port cath culture  in1 bottle, peripheral culture NGTD, repeated 10/8- NGTD.Seen by ID Dr Allean Found his immunocompromised  status advised to continue vancomycin x1 week and repeat peripheral cultures at completion of treatment.Vanco trough high at 30 and vancomycin is on hold for next 36 hours-next dose tomorrow morning, with level  Possible community-acquired pneumonia: less likely pneumonia,he has no respiratory symptoms.  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 1/8 on admission and holding stable in 1.3-1.6.  Monitor closely due to elevated Vanco level.  AKI Likely in the setting of ascites/decreased oral intake.  Monitor closely as he is also on Lasix and Aldactone. Recent Labs  Lab 03/04/20 0330 03/05/20 0314 03/06/20 0345 03/07/20 0410 03/08/20 0326  BUN 44* 47* 49* 51* 51*  CREATININE 1.82* 1.66* 1.36* 1.53* 1.47*   Cancer of pancreas, tail: Reports he is on chemotherapy q 3 wk- last chemo was mid-September.  CT head demonstrates a decrease in size of the mass as well as metastatic lesion in the liver.  Dr. Learta Codding from oncology is following, appreciate input. Cytology from ascitic fluid negative for malignancy-oncology is planning for outpatient follow-up.  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  History of stroke/HTN/HLD: BP is stable continue his home asa,Plavix, amlodipine, atenolol,and simvastatin.  Hyponatremia sodium improving,Monitor closely. Avoid ivf 2/2 ascites. Hypokalemia: will replete with potassium chloride  Anemia: hb stable in 10 gm range. likely from chronic disease.   Nausea & vomiting: Tolerating diet.  Continue supportive measures.    Morbid obesity BMI  Body mass index is 35.12 kg/m: Will benefit with weight loss and healthy lifestyle  Nutrition: Diet Order            Diet regular Room service appropriate? Yes; Fluid consistency: Thin  Diet effective now  DVT prophylaxis: heparin injection 5,000 Units Start: 03/03/20 1600 SCDs Start: 03/03/20 1504 Place TED hose Start: 03/03/20 1504 Code Status:    Code Status: Full Code  Family Communication: plan of care discussed with patient at bedside.  Status is: Inpatient Remains inpatient appropriate because:IV treatments appropriate due to intensity of illness or inability to take PO and Inpatient level of care appropriate due to severity of illness for continued need of IV antibiotics.  Dispo: The patient is from: Home              Anticipated d/c is to: Home PT/OT  hh w RW.              Anticipated d/c date is: Tomorrow after 1 dose of vancomycin and blood cultures.              Patient currently is not medically stable to d/c. Consultants:see note  Procedures:see note  Culture/Microbiology    Component Value Date/Time   SDES  03/05/2020 1733    BLOOD LEFT ANTECUBITAL Performed at [facility] [address], [place], [place] [address]    SDES  03/05/2020 1733    BLOOD LEFT HAND Performed at [facility], [place] [address], [place], [place] [address]    [place]  03/05/2020 1733    BOTTLES DRAWN AEROBIC AND ANAEROBIC Blood Culture adequate volume Performed at [facility] [address] [place], [place] [address]    [place]  03/05/2020 1733    BOTTLES DRAWN AEROBIC AND ANAEROBIC Blood Culture adequate volume Performed at [facility] [address] [place], [place] [address]    CULT  03/05/2020 1733    NO GROWTH 3 DAYS Performed at [facility] [address], [place], [place] [address]    CULT  03/05/2020 1733    NO GROWTH 3 DAYS Performed at [facility], [place] [address], [place], [place] [address]    REPTSTATUS PENDING 03/05/2020 1733   REPTSTATUS PENDING 03/05/2020 1733    Other culture-see note  Medications: Scheduled Meds: . amLODipine  10 mg Oral Daily  . aspirin  81 mg Oral Daily  . atenolol  25 mg Oral Daily  . Chlorhexidine Gluconate Cloth  6 each Topical Daily  . clopidogrel  75 mg Oral Daily  . docusate  sodium  100 mg Oral BID  . furosemide  40 mg Oral Daily  . heparin  5,000 Units Subcutaneous Q8H  . simvastatin  20 mg Oral QHS  . sodium chloride flush  10-40 mL Intracatheter Q12H  . sodium chloride flush  3 mL Intravenous Q12H  . spironolactone  50 mg Oral Daily   Continuous Infusions: . sodium chloride Stopped (03/07/20 2225)  . [START ON 03/09/2020] vancomycin      Antimicrobials: Anti-infectives (From admission, onward)   Start     Dose/Rate Route Frequency Ordered Stop   03/09/20 0800  vancomycin (VANCOCIN) IVPB 1000 mg/200 mL premix        1,000 mg 200 mL/hr over 60 Minutes Intravenous Every 24 hours 03/08/20 0133     03/05/20 2000  vancomycin (VANCOREADY) IVPB 1500 mg/300 mL  Status:  Discontinued        1,500 mg 150 mL/hr over 120 Minutes Intravenous Every 24 hours 03/05/20 1819 03/08/20 0133   03/04/20 2200  vancomycin (VANCOREADY) IVPB 1250 mg/250 mL  Status:  Discontinued        1,250 mg 166.7 mL/hr over 90 Minutes Intravenous  Every 24 hours 03/04/20 1155 03/05/20 1059   03/04/20 0500  cefTRIAXone (ROCEPHIN) 2 g in sodium chloride 0.9 % 100 mL IVPB  Status:  Discontinued        2 g 200 mL/hr over 30 Minutes Intravenous Every 24 hours 03/03/20 1511 03/06/20 1224   03/03/20 1500  vancomycin (VANCOREADY) IVPB 2000 mg/400 mL        2,000 mg 200 mL/hr over 120 Minutes Intravenous  Once 03/03/20 1447 03/03/20 2012   03/03/20 1445  ceFEPIme (MAXIPIME) 2 g in sodium chloride 0.9 % 100 mL IVPB        2 g 200 mL/hr over 30 Minutes Intravenous  Once 03/03/20 1435 03/03/20 1811     Objective: Vitals: Today's Vitals   03/07/20 1800 03/07/20 1950 03/07/20 2038 03/08/20 0602  BP:   117/80 131/77  Pulse:   70 72  Resp:   18 18  Temp:   97.8 F (36.6 C) 98 F (36.7 C)  TempSrc:    Oral  SpO2:   99% 97%  Weight:      Height:      PainSc: 0-No pain 0-No pain      Intake/Output Summary (Last 24 hours) at 03/08/2020 0750 Last data filed at 03/08/2020 0630 Gross per 24  hour  Intake 1160.67 ml  Output 1200 ml  Net -39.33 ml   Filed Weights   03/03/20 1133 03/05/20 0500  Weight: 98.9 kg 98.7 kg   Weight change:   Intake/Output from previous day: 10/10 0701 - 10/11 0700 In: 1160.7 [P.O.:720; I.V.:140.6; IV Piggyback:300.1] Out: 6644 [Urine:1200; Stool:450] Intake/Output this shift: No intake/output data recorded.  Examination: General exam: AAOx3 , NAD, weak appearing. HEENT:Oral mucosa moist, Ear/Nose WNL grossly, dentition normal. Respiratory system: bilaterally clear,no wheezing or crackles,no use of accessory muscle Cardiovascular system: S1 & S2 +, No JVD,. Gastrointestinal system: Abdomen soft, moderately distended,BS+. Left colostomy in place with stool. Nervous System:Alert, awake, moving extremities and grossly nonfocal Extremities: ankle edema MILD, distal peripheral pulses palpable.  Skin: No rashes,no icterus. MSK: Normal muscle bulk,tone, power  Data Reviewed: I have personally reviewed following labs and imaging studies CBC: Recent Labs  Lab 03/03/20 1238 03/03/20 1238 03/04/20 0330 03/05/20 0314 03/06/20 0345 03/07/20 0410 03/08/20 0326  WBC 13.4*   < > 9.1 6.7 7.7 9.1 8.7  NEUTROABS 9.7*  --   --   --   --   --   --   HGB 13.1   < > 10.7* 9.7* 10.1* 10.8* 10.4*  HCT 40.4   < > 32.9* 29.5* 31.1* 32.4* 31.7*  MCV 92.7   < > 92.9 91.0 91.2 90.8 91.6  PLT 255   < > 151 118* 121* 128* 117*   < > = values in this interval not displayed.   Basic Metabolic Panel: Recent Labs  Lab 03/03/20 1237 03/03/20 1238 03/04/20 0330 03/05/20 0314 03/06/20 0345 03/07/20 0410 03/08/20 0326  NA  --    < > 130* 133* 136 133* 132*  K  --    < > 4.8 3.9 3.4* 3.4* 3.5  CL  --    < > 100 101 102 100 100  CO2  --    < > 22 24 23 24 23   GLUCOSE  --    < > 107* 102* 81 101* 106*  BUN  --    < > 44* 47* 49* 51* 51*  CREATININE  --    < > 1.82* 1.66* 1.36* 1.53* 1.47*  CALCIUM 8.0*   < > 7.6* 7.7* 7.7* 7.7* 7.6*  MG 2.9*  --   --   --    --   --   --   PHOS 3.9  --   --   --   --   --   --    < > = values in this interval not displayed.   GFR: Estimated Creatinine Clearance: 52.9 mL/min (A) (by C-G formula based on SCr of 1.47 mg/dL (H)). Liver Function Tests: Recent Labs  Lab 03/03/20 1238 03/07/20 0410 03/08/20 0326  AST 61* 45* 42*  ALT 34 23 22  ALKPHOS 167* 118 113  BILITOT 1.6* 0.9 0.9  PROT 6.3* 5.6* 5.4*  ALBUMIN 2.5* 2.8* 2.6*   No results for input(s): LIPASE, AMYLASE in the last 168 hours. No results for input(s): AMMONIA in the last 168 hours. Coagulation Profile: Recent Labs  Lab 03/03/20 1504  INR 1.3*   Cardiac Enzymes: No results for input(s): CKTOTAL, CKMB, CKMBINDEX, TROPONINI in the last 168 hours. BNP (last 3 results) No results for input(s): PROBNP in the last 8760 hours. HbA1C: No results for input(s): HGBA1C in the last 72 hours. CBG: Recent Labs  Lab 03/04/20 0750 03/05/20 0740 03/06/20 0734 03/07/20 0738 03/08/20 0741  GLUCAP 99 88 87 107* 88   Lipid Profile: No results for input(s): CHOL, HDL, LDLCALC, TRIG, CHOLHDL, LDLDIRECT in the last 72 hours. Thyroid Function Tests: No results for input(s): TSH, T4TOTAL, FREET4, T3FREE, THYROIDAB in the last 72 hours. Anemia Panel: No results for input(s): VITAMINB12, FOLATE, FERRITIN, TIBC, IRON, RETICCTPCT in the last 72 hours. Sepsis Labs: No results for input(s): PROCALCITON, LATICACIDVEN in the last 168 hours.  Recent Results (from the past 240 hour(s))  Respiratory Panel by RT PCR (Flu A&B, Covid) - Nasopharyngeal Swab     Status: None   Collection Time: 03/03/20  2:18 PM   Specimen: Nasopharyngeal Swab  Result Value Ref Range Status   SARS Coronavirus 2 by RT PCR NEGATIVE NEGATIVE Final    Comment: (NOTE) SARS-CoV-2 target nucleic acids are NOT DETECTED.  The SARS-CoV-2 RNA is generally detectable in upper respiratoy specimens during the acute phase of infection. The lowest concentration of SARS-CoV-2 viral copies  this assay can detect is 131 copies/mL. A negative result does not preclude SARS-Cov-2 infection and should not be used as the sole basis for treatment or other patient management decisions. A negative result may occur with  improper specimen collection/handling, submission of specimen other than nasopharyngeal swab, presence of viral mutation(s) within the areas targeted by this assay, and inadequate number of viral copies (<131 copies/mL). A negative result must be combined with clinical observations, patient history, and epidemiological information. The expected result is Negative.  Fact Sheet for Patients:  PinkCheek.be  Fact Sheet for Healthcare Providers:  GravelBags.it  This test is no t yet approved or cleared by the [place] FDA and  has been authorized for detection and/or diagnosis of SARS-CoV-2 by FDA under an Emergency Use Authorization (EUA). This EUA will remain  in effect (meaning this test can be used) for the duration of the COVID-19 declaration under Section 564(b)(1) of the Act, 21 U.S.C. section 360bbb-3(b)(1), unless the authorization is terminated or revoked sooner.     Influenza A by PCR NEGATIVE NEGATIVE Final   Influenza B by PCR NEGATIVE NEGATIVE Final    Comment: (NOTE) The Xpert Xpress SARS-CoV-2/FLU/RSV assay is intended as an aid in  the diagnosis of influenza from Nasopharyngeal  swab specimens and  should not be used as a sole basis for treatment. Nasal washings and  aspirates are unacceptable for Xpert Xpress SARS-CoV-2/FLU/RSV  testing.  Fact Sheet for Patients: PinkCheek.be  Fact Sheet for Healthcare Providers: GravelBags.it  This test is not yet approved or cleared by the [place] FDA and  has been authorized for detection and/or diagnosis of SARS-CoV-2 by  FDA under an Emergency Use Authorization (EUA). This EUA  will remain  in effect (meaning this test can be used) for the duration of the  Covid-19 declaration under Section 564(b)(1) of the Act, 21  U.S.C. section 360bbb-3(b)(1), unless the authorization is  terminated or revoked. Performed at [facility], [place] [address] [place], [place] [address]   Body fluid culture     Status: None   Collection Time: 03/03/20  4:38 PM   Specimen: PATH Cytology Peritoneal fluid  Result Value Ref Range Status   Specimen Description   Final    PERITONEAL Performed at [facility] [address], [place], [place] [address]    Special Requests   Final    NONE Performed at [facility], [place] [address], [place], [place] [address]    Gram Stain   Final    WBC PRESENT, PREDOMINANTLY MONONUCLEAR NO ORGANISMS SEEN CYTOSPIN SMEAR Gram Stain Report Called to,Read Back By and Verified With: C.SIMPSON AT 1848 ON 03/03/20 BY N.THOMPSON Performed at [facility], [place] [address] [place], [place] [address]    Culture   Final    NO GROWTH 3 DAYS Performed at [facility], [place] [address], [place], [place] [address]    Report Status 03/07/2020 FINAL  Final  Aerobic Culture (superficial specimen)     Status: None   Collection Time: 03/03/20  4:38 PM   Specimen: PATH Cytology Peritoneal fluid  Result Value Ref Range Status   Specimen Description   Final    PERITONEAL Performed at [facility] [address] [place], [place] [address]    Special Requests   Final    NONE Performed at [facility], [place] [address] [place], [place] [address]    Gram Stain NO WBC SEEN NO ORGANISMS SEEN   Final   Culture   Final    NO GROWTH 2 DAYS Performed at [facility], [place] [address], [place], [place] [address]    Report Status 03/06/2020 FINAL  Final  Blood culture (routine x 2)     Status: Abnormal   Collection Time: 03/03/20  4:49 PM    Specimen: BLOOD  Result Value Ref Range Status   Specimen Description   Final    BLOOD PORTA CATH RIGHT CHEST Performed at [facility] [address], [place], [place] [address]    Special Requests   Final    BOTTLES DRAWN AEROBIC AND ANAEROBIC Blood Culture adequate volume Performed at [facility] [address], [place], [place] [address]    Culture  Setup Time   Final    GRAM POSITIVE COCCI IN CLUSTERS IN BOTH AEROBIC AND ANAEROBIC BOTTLES CRITICAL RESULT CALLED TO, READ BACK BY AND VERIFIED WITH: PHARMD C.SHADE AT 1100 ON 03/04/2020 BY T.SAAD    Culture (A)  Final    STAPHYLOCOCCUS HOMINIS STAPHYLOCOCCUS EPIDERMIDIS THE SIGNIFICANCE OF ISOLATING THIS ORGANISM FROM A SINGLE SET OF BLOOD CULTURES WHEN MULTIPLE SETS ARE DRAWN IS UNCERTAIN. PLEASE NOTIFY THE MICROBIOLOGY DEPARTMENT WITHIN ONE  WEEK IF SPECIATION AND SENSITIVITIES ARE REQUIRED. Performed at [facility], [place] [address] [place], [place] [address]    Report Status 03/06/2020 FINAL  Final  Blood Culture ID Panel (Reflexed)     Status: Abnormal   Collection Time: 03/03/20  4:49 PM  Result Value Ref Range Status   Enterococcus faecalis NOT DETECTED NOT DETECTED Final   Enterococcus Faecium NOT DETECTED NOT DETECTED Final   Listeria monocytogenes NOT DETECTED NOT DETECTED Final   Staphylococcus species DETECTED (A) NOT DETECTED Final    Comment: CRITICAL RESULT CALLED TO, READ BACK BY AND VERIFIED WITH: PHARMD C.SHADE AT 1100 ON 03/04/2020 BY T.SAAD    Staphylococcus aureus (BCID) NOT DETECTED NOT DETECTED Final   Staphylococcus epidermidis DETECTED (A) NOT DETECTED Final    Comment: Methicillin (oxacillin) resistant coagulase negative staphylococcus. Possible blood culture contaminant (unless isolated from more than one blood culture draw or clinical case suggests pathogenicity). No antibiotic treatment is indicated for blood  culture contaminants. CRITICAL RESULT CALLED  TO, READ BACK BY AND VERIFIED WITH: PHARMD C.SHADE AT 1100 ON 03/04/2020 BY T.SAAD    Staphylococcus lugdunensis NOT DETECTED NOT DETECTED Final   Streptococcus species NOT DETECTED NOT DETECTED Final   Streptococcus agalactiae NOT DETECTED NOT DETECTED Final   Streptococcus pneumoniae NOT DETECTED NOT DETECTED Final   Streptococcus pyogenes NOT DETECTED NOT DETECTED Final   A.calcoaceticus-baumannii NOT DETECTED NOT DETECTED Final   Bacteroides fragilis NOT DETECTED NOT DETECTED Final   Enterobacterales NOT DETECTED NOT DETECTED Final   Enterobacter cloacae complex NOT DETECTED NOT DETECTED Final   Escherichia coli NOT DETECTED NOT DETECTED Final   Klebsiella aerogenes NOT DETECTED NOT DETECTED Final   Klebsiella oxytoca NOT DETECTED NOT DETECTED Final   Klebsiella pneumoniae NOT DETECTED NOT DETECTED Final   Proteus species NOT DETECTED NOT DETECTED Final   Salmonella species NOT DETECTED NOT DETECTED Final   Serratia marcescens NOT DETECTED NOT DETECTED Final   Haemophilus influenzae NOT DETECTED NOT DETECTED Final   Neisseria meningitidis NOT DETECTED NOT DETECTED Final   Pseudomonas aeruginosa NOT DETECTED NOT DETECTED Final   Stenotrophomonas maltophilia NOT DETECTED NOT DETECTED Final   Candida albicans NOT DETECTED NOT DETECTED Final   Candida auris NOT DETECTED NOT DETECTED Final   Candida glabrata NOT DETECTED NOT DETECTED Final   Candida krusei NOT DETECTED NOT DETECTED Final   Candida parapsilosis NOT DETECTED NOT DETECTED Final   Candida tropicalis NOT DETECTED NOT DETECTED Final   Cryptococcus neoformans/gattii NOT DETECTED NOT DETECTED Final   Methicillin resistance mecA/C DETECTED (A) NOT DETECTED Final    Comment: CRITICAL RESULT CALLED TO, READ BACK BY AND VERIFIED WITH: PHARMD C.SHADE AT 1100 ON 03/04/2020 BY T.SAAD Performed at [facility], [place] [address], [place], [place] [address]   Blood culture (routine x 2)     Status: None   Collection Time:  03/03/20  5:09 PM   Specimen: BLOOD  Result Value Ref Range Status   Specimen Description   Final    BLOOD LEFT ANTECUBITAL Performed at [facility] [address] [place], [place] [address]    Special Requests   Final    BOTTLES DRAWN AEROBIC AND ANAEROBIC Blood Culture adequate volume Performed at [facility] [address] [place], [place] [address]    Culture   Final    NO GROWTH 5 DAYS Performed at [facility], [place] [address] [place],  [address]    Report  Status 03/08/2020 FINAL  Final  Culture, blood (Routine X 2) w Reflex to ID Panel     Status: None (Preliminary result)   Collection Time: 03/05/20  5:33 PM   Specimen: BLOOD  Result Value Ref Range Status   Specimen Description   Final    BLOOD LEFT ANTECUBITAL Performed at [facility] [address], [place], [place] [address]    Special Requests   Final    BOTTLES DRAWN AEROBIC AND ANAEROBIC Blood Culture adequate volume Performed at [facility] [address], [place], [place] [address]    Culture   Final    NO GROWTH 3 DAYS Performed at [facility], [place] [address], [place], [place] [address]    Report Status PENDING  Incomplete  Culture, blood (Routine X 2) w Reflex to ID Panel     Status: None (Preliminary result)   Collection Time: 03/05/20  5:33 PM   Specimen: BLOOD LEFT HAND  Result Value Ref Range Status   Specimen Description   Final    BLOOD LEFT HAND Performed at [facility] [address], [place], [place] [address]    Special Requests   Final    BOTTLES DRAWN AEROBIC AND ANAEROBIC Blood Culture adequate volume Performed at [facility] [address], [place], [place] [address]    Culture   Final    NO GROWTH 3 DAYS Performed at [facility], [place] [address], [place], [place] [address]    Report Status PENDING  Incomplete     Radiology  Studies: US Paracentesis  Result Date: 03/07/2020 INDICATION: Patient with a history of pancreatic cancer and recurrent large volume ascites. Interventional radiology asked to perform a therapeutic and diagnostic paracentesis. EXAM: ULTRASOUND GUIDED  PARACENTESIS MEDICATIONS: 1% lidocaine 10 mL COMPLICATIONS: None immediate. PROCEDURE: Informed written consent was obtained from the patient after a discussion of the risks, benefits and alternatives to treatment. A timeout was performed prior to the initiation of the procedure. Initial ultrasound scanning demonstrates a large amount of ascites within the right lower abdominal quadrant. The right lower abdomen was prepped and draped in the usual sterile fashion. 1% lidocaine was used for local anesthesia. Following this, a 19 gauge, 7-cm, Yueh catheter was introduced. An ultrasound image was saved for documentation purposes. The paracentesis was performed. The catheter was inadvertently removed midway through the procedure. Ultrasound imaging demonstrated additional fluid. The area was prepped and draped again in the usual sterile fashion and another Teressa Lower was reinserted. The remainder of the fluid was drained without further complication. The catheter was removed and a dressing was applied. The patient tolerated the procedure well without immediate post procedural complication. FINDINGS: A total of approximately 3.4 L of clear yellow fluid was removed. Samples were sent to the laboratory as requested by the clinical team. IMPRESSION: Successful ultrasound-guided paracentesis yielding 3.4 liters of peritoneal fluid. Read by: Soyla Dryer, NP Electronically Signed   By: Lucrezia Europe M.D.   On: 03/07/2020 13:19     LOS: 5 days   Antonieta Pert, MD [facility]  03/08/2020, 7:50 AM

## 2020-03-09 DIAGNOSIS — R188 Other ascites: Secondary | ICD-10-CM | POA: Diagnosis not present

## 2020-03-09 LAB — BASIC METABOLIC PANEL
Anion gap: 7 (ref 5–15)
BUN: 56 mg/dL — ABNORMAL HIGH (ref 8–23)
CO2: 24 mmol/L (ref 22–32)
Calcium: 7.6 mg/dL — ABNORMAL LOW (ref 8.9–10.3)
Chloride: 102 mmol/L (ref 98–111)
Creatinine, Ser: 1.52 mg/dL — ABNORMAL HIGH (ref 0.61–1.24)
GFR, Estimated: 46 mL/min — ABNORMAL LOW (ref >60)
Glucose, Bld: 92 mg/dL (ref 70–99)
Potassium: 3.6 mmol/L (ref 3.5–5.1)
Sodium: 133 mmol/L — ABNORMAL LOW (ref 135–145)

## 2020-03-09 LAB — CBC
HCT: 32.7 % — ABNORMAL LOW (ref 39.0–52.0)
Hemoglobin: 11 g/dL — ABNORMAL LOW (ref 13.0–17.0)
MCH: 30.4 pg (ref 26.0–34.0)
MCHC: 33.6 g/dL (ref 30.0–36.0)
MCV: 90.3 fL (ref 80.0–100.0)
Platelets: 113 10*3/uL — ABNORMAL LOW (ref 150–400)
RBC: 3.62 MIL/uL — ABNORMAL LOW (ref 4.22–5.81)
RDW: 19.7 % — ABNORMAL HIGH (ref 11.5–15.5)
WBC: 9.7 10*3/uL (ref 4.0–10.5)
nRBC: 0 % (ref 0.0–0.2)

## 2020-03-09 LAB — CYTOLOGY - NON PAP

## 2020-03-09 LAB — GLUCOSE, CAPILLARY: Glucose-Capillary: 90 mg/dL (ref 70–99)

## 2020-03-09 MED ORDER — HEPARIN SOD (PORK) LOCK FLUSH 100 UNIT/ML IV SOLN
500.0000 [IU] | INTRAVENOUS | Status: AC | PRN
  Administered 2020-03-09: 500 [IU]
  Filled 2020-03-09: qty 5

## 2020-03-09 MED ORDER — DOCUSATE SODIUM 100 MG PO CAPS
100.0000 mg | ORAL_CAPSULE | Freq: Two times a day (BID) | ORAL | 0 refills | Status: DC
Start: 2020-03-09 — End: 2020-04-09

## 2020-03-09 MED ORDER — SPIRONOLACTONE 50 MG PO TABS: 50.0000 mg | ORAL_TABLET | Freq: Every day | ORAL | 1 refills | Status: AC

## 2020-03-09 MED ORDER — FUROSEMIDE 40 MG PO TABS: 40.0000 mg | ORAL_TABLET | Freq: Every day | ORAL | 1 refills | Status: AC

## 2020-03-09 MED ORDER — ONDANSETRON HCL 4 MG PO TABS: 4.0000 mg | ORAL_TABLET | Freq: Four times a day (QID) | ORAL | 0 refills | Status: DC | PRN

## 2020-03-09 NOTE — Plan of Care (Signed)
  Problem: Education: Goal: Knowledge of General Education information will improve Description Including pain rating scale, medication(s)/side effects and non-pharmacologic comfort measures Outcome: Progressing   Problem: Nutrition: Goal: Adequate nutrition will be maintained Outcome: Progressing   Problem: Pain Managment: Goal: General experience of comfort will improve Outcome: Progressing   

## 2020-03-09 NOTE — Discharge Summary (Signed)
Physician Discharge Summary  Alex [name]:[id number] DOB: 1951-05-03 DOA: 03/03/2020  PCP: Antonietta Jewel, MD  Admit date: 03/03/2020 Discharge date: 03/09/2020  Admitted From: home Disposition:  home  Recommendations for Outpatient Follow-up:  1. Follow up with PCP in 1-2 weeks 2. Please obtain BMP/CBC in one week 3. Please follow up on the following pending results:  Home Health:yes  Equipment/Devices: none  Discharge Condition: Stable Code Status:   Code Status: Full Code Diet recommendation:  Diet Order            Diet - low sodium heart healthy           Diet regular Room service appropriate? 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 Learta Codding, HTN, CAD, HLD, stroke, adenocarcinoma sigmoid resection colostomy, chronic anemia presented with progressive weakness fall progressive distention of abdomen.  CT abdomen in the ED showed decrease in size of distal tail of pancreas lesion, large volume ascites, metastatic lesion within the liver not confidently seen on this study compared to previous 1 and no new focal liver abnormalities, bilateral lung nodules CT head no finding. Patient was admitted underwent abdominal paracentesis with 5 L fluid removal-cytology was sent.  No evidence of SBP.Patient on empiric ceftriaxone. GI was consulted with his portal hypertension and ascites.  Was seen by Dr. Learta Codding from oncology. Patient's large-volume ascites and portal hypertension likely from underlying cirrhosis as per GI and they will follow-up as outpatient. 10/9 repeat paracentesis with 3.8 L fluid removal Symptomatically doing well.  Patient completing 7 days of vancomycin 10/12 and repeat blood culture sent.  He will be discharged home with instruction to follow-up with PCP oncology and GI.  Discharge Diagnoses:   Large volume Ascites with SAAG greater  than 1.1 with evidence of portal hypertension/varices etiology of likely underlying cirrhosis uncertain.  Seen by GI. S/p paracentesis w/ fluid removal:5 L on 10/6 and 3.8 L on 10/9. Cont po lasix, po aldactone. Off abx as  no evidence of SBP.Cytology without evidence of malignant cells, repeat cytology pending.  Follow-up with GI as outpatient to discuss more about his cirrhosis.  Staph epidermidis/Staph Hominis Bactremia- blood culture from port cath culture  in1 bottle, peripheral culture NGTD, repeated 10/8- NGTD.Seen by ID Dr Allean Found his immunocompromised status advised to continue vancomycin x1 week and repeat peripheral cultures at completion of treatment.Vanco trough high at 30 and he completed vancomycin therapy 10/12.  Blood culture ordered and will be followed by his oncologist.    Possible community-acquired pneumonia: less likely pneumonia,he has no respiratory symptoms.  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 1/8 on admission and holding stable in 1.3-1.6.  Monitor closely due to elevated Vanco level.  AKI Likely in the setting of ascites/decreased oral intake.   Advised follow-up BMP in 1 week from PCP given that he is on Lasix and Aldactone. Recent Labs  Lab 03/05/20 0314 03/06/20 0345 03/07/20 0410 03/08/20 0326 03/09/20 0200  BUN 47* 49* 51* 51* 56*  CREATININE 1.66* 1.36* 1.53* 1.47* 1.52*   Cancer of pancreas, tail: Reports he is on chemotherapy q 3 wk- last chemo was mid-September.  CT head demonstrates a decrease in size of the mass as well as metastatic lesion in the liver.  Dr. Learta Codding from oncology is following, appreciate input. Cytology from  ascitic fluid negative for malignancy-oncology is planning for outpatient follow-up.  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  History of stroke/HTN/HLD: BP is stable continue his home asa,Plavix, amlodipine, atenolol,and simvastatin.  Hyponatremia it has  resolved.   Hypokalemia: Resolved. Anemia: hb stable at 11 g today.likely from chronic disease.   Nausea & vomiting: Resolved.  Tolerating diet.    Morbid obesity BMI  Body mass index is 35.12 kg/m: Will benefit with weight loss and healthy lifestyle  Consults:  Oncology, GI, IR  Subjective: Alert awake oriented, resting comfortably.  No abdominal pain shortness of breath.  Discharge Exam: Vitals:   03/09/20 0609 03/09/20 1333  BP: 117/75 108/74  Pulse: 75 69  Resp: 16 18  Temp: 98.1 F (36.7 C) 98.2 F (36.8 C)  SpO2: 97% 96%   General: Pt is alert, awake, not in acute distress Cardiovascular: RRR, S1/S2 +, no rubs, no gallops Respiratory: CTA bilaterally, no wheezing, no rhonchi Abdominal: Soft, NT, ND, bowel sounds + Extremities: no edema, no cyanosis  Discharge Instructions  Discharge Instructions    Diet - low sodium heart healthy   Complete by: As directed    Salt intake less than 2 g/day.  Limit fluid intake less than 1550 mL/day   Discharge instructions   Complete by: As directed    Check your basic metabolic panel from your primary care doctor next 5 to 7 days to monitor renal function and potassium level  Please call call MD or return to ER for similar or worsening recurring problem that brought you to hospital or if any fever,nausea/vomiting,abdominal pain, uncontrolled pain, chest pain,  shortness of breath or any other alarming symptoms.  Please follow-up your doctor as instructed in a week time and call the office for appointment.  Please avoid alcohol, smoking, or any other illicit substance and maintain healthy habits including taking your regular medications as prescribed.  You were cared for by a hospitalist during your hospital stay. If you have any questions about your discharge medications or the care you received while you were in the hospital after you are discharged, you can call the unit and ask to speak with the hospitalist on call if the  hospitalist that took care of you is not available.  Once you are discharged, your primary care physician will handle any further medical issues. Please note that NO REFILLS for any discharge medications will be authorized once you are discharged, as it is imperative that you return to your primary care physician (or establish a relationship with a primary care physician if you do not have one) for your aftercare needs so that they can reassess your need for medications and monitor your lab values   Increase activity slowly   Complete by: As directed      Allergies as of 03/09/2020   No Known Allergies     Medication List    STOP taking these medications   hydrochlorothiazide 25 MG tablet Commonly known as: HYDRODIURIL   potassium chloride SA 20 MEQ tablet Commonly known as: KLOR-CON     TAKE these medications   acetaminophen 500 MG tablet Commonly known as: TYLENOL Take 2 tablets (1,000 mg total) by mouth every 6 (six) hours as needed. What changed:   how much to take  reasons to take this   amLODipine 10 MG tablet Commonly known as: NORVASC Take 10 mg by mouth daily.   aspirin 81 MG tablet Take 81 mg by mouth daily.  atenolol 25 MG tablet Commonly known as: TENORMIN Take 25 mg by mouth daily.   clopidogrel 75 MG tablet Commonly known as: PLAVIX Take 75 mg by mouth daily.   docusate sodium 100 MG capsule Commonly known as: COLACE Take 1 capsule (100 mg total) by mouth 2 (two) times daily.   furosemide 40 MG tablet Commonly known as: LASIX Take 1 tablet (40 mg total) by mouth daily. Start taking on: March 10, 2020   gabapentin 100 MG capsule Commonly known as: NEURONTIN Take 3 capsules (300 mg total) by mouth at bedtime.   lidocaine-prilocaine cream Commonly known as: EMLA Apply 1 application topically as needed. Apply to portacath  1 1/2 -  2 hours prior to procedures as needed. What changed:   reasons to take this  additional instructions    ondansetron 4 MG tablet Commonly known as: ZOFRAN Take 1 tablet (4 mg total) by mouth every 6 (six) hours as needed for nausea.   prochlorperazine 10 MG tablet Commonly known as: COMPAZINE Take 1 tablet (10 mg total) by mouth every 6 (six) hours as needed for nausea or vomiting. What changed: when to take this   simvastatin 20 MG tablet Commonly known as: ZOCOR Take 20 mg by mouth at bedtime.   spironolactone 50 MG tablet Commonly known as: ALDACTONE Take 1 tablet (50 mg total) by mouth daily.            Durable Medical Equipment  (From admission, onward)         Start     Ordered   03/08/20 1459  For home use only DME Walker rolling  Once       Question Answer Comment  Walker: With 5 Inch Wheels   Patient needs a walker to treat with the following condition Physical deconditioning      03/08/20 Wahkon, Sami, MD Follow up in 1 week(s).   Specialty: Internal Medicine Why: Check BMP to monitor renal function while you are on Lasix and Aldactone Contact information: [address] [place] [address] [phone]        Irene Shipper, MD. Call.   Specialty: Gastroenterology Contact information: [address] [place] [address] [phone]        Ladell Pier, MD. Call.   Specialty: Oncology Contact information: [place] [address] [phone]              No Known Allergies  The results of significant diagnostics from this hospitalization (including imaging, microbiology, ancillary and laboratory) are listed below for reference.    Microbiology: Recent Results (from the past 240 hour(s))  Respiratory Panel by RT PCR (Flu A&B, Covid) - Nasopharyngeal Swab     Status: None   Collection Time: 03/03/20  2:18 PM   Specimen: Nasopharyngeal Swab  Result Value Ref Range Status   SARS Coronavirus 2 by RT PCR NEGATIVE NEGATIVE Final    Comment:  (NOTE) SARS-CoV-2 target nucleic acids are NOT DETECTED.  The SARS-CoV-2 RNA is generally detectable in upper respiratoy specimens during the acute phase of infection. The lowest concentration of SARS-CoV-2 viral copies this assay can detect is 131 copies/mL. A negative result does not preclude SARS-Cov-2 infection and should not be used as the sole basis for treatment or other patient management decisions. A negative result may occur with  improper specimen collection/handling, submission of specimen other than nasopharyngeal  swab, presence of viral mutation(s) within the areas targeted by this assay, and inadequate number of viral copies (<131 copies/mL). A negative result must be combined with clinical observations, patient history, and epidemiological information. The expected result is Negative.  Fact Sheet for Patients:  PinkCheek.be  Fact Sheet for Healthcare Providers:  GravelBags.it  This test is no t yet approved or cleared by the [place] FDA and  has been authorized for detection and/or diagnosis of SARS-CoV-2 by FDA under an Emergency Use Authorization (EUA). This EUA will remain  in effect (meaning this test can be used) for the duration of the COVID-19 declaration under Section 564(b)(1) of the Act, 21 U.S.C. section 360bbb-3(b)(1), unless the authorization is terminated or revoked sooner.     Influenza A by PCR NEGATIVE NEGATIVE Final   Influenza B by PCR NEGATIVE NEGATIVE Final    Comment: (NOTE) The Xpert Xpress SARS-CoV-2/FLU/RSV assay is intended as an aid in  the diagnosis of influenza from Nasopharyngeal swab specimens and  should not be used as a sole basis for treatment. Nasal washings and  aspirates are unacceptable for Xpert Xpress SARS-CoV-2/FLU/RSV  testing.  Fact Sheet for Patients: PinkCheek.be  Fact Sheet for Healthcare  Providers: GravelBags.it  This test is not yet approved or cleared by the [place] FDA and  has been authorized for detection and/or diagnosis of SARS-CoV-2 by  FDA under an Emergency Use Authorization (EUA). This EUA will remain  in effect (meaning this test can be used) for the duration of the  Covid-19 declaration under Section 564(b)(1) of the Act, 21  U.S.C. section 360bbb-3(b)(1), unless the authorization is  terminated or revoked. Performed at [facility], [place] [address], [place], [place] [address]   Body fluid culture     Status: None   Collection Time: 03/03/20  4:38 PM   Specimen: PATH Cytology Peritoneal fluid  Result Value Ref Range Status   Specimen Description   Final    PERITONEAL Performed at [place] [address], [place], [place] [address]    Special Requests   Final    NONE Performed at [facility], [place] [address] [place], [place] [address]    Gram Stain   Final    WBC PRESENT, PREDOMINANTLY MONONUCLEAR NO ORGANISMS SEEN CYTOSPIN SMEAR Gram Stain Report Called to,Read Back By and Verified With: C.SIMPSON AT 1848 ON 03/03/20 BY N.THOMPSON Performed at [facility], [place] [address] [place], [place] [address]    Culture   Final    NO GROWTH 3 DAYS Performed at [facility], [place] [address], [place], [place] [address]    Report Status 03/07/2020 FINAL  Final  Aerobic Culture (superficial specimen)     Status: None   Collection Time: 03/03/20  4:38 PM   Specimen: PATH Cytology Peritoneal fluid  Result Value Ref Range Status   Specimen Description   Final    PERITONEAL Performed at [place] [address], [place], [place] [address]    Special Requests   Final    NONE Performed at [facility], [place] [address] [place], [place] [address]    Gram Stain NO WBC SEEN NO ORGANISMS SEEN   Final    Culture   Final    NO GROWTH 2 DAYS Performed at [facility], [place] [address], [place],  [address]    Report Status 03/06/2020 FINAL  Final  Blood culture (routine x  2)     Status: Abnormal   Collection Time: 03/03/20  4:49 PM   Specimen: BLOOD  Result Value Ref Range Status   Specimen Description   Final    BLOOD PORTA CATH RIGHT CHEST Performed at [place] [address], [place], [place] [address]    Special Requests   Final    BOTTLES DRAWN AEROBIC AND ANAEROBIC Blood Culture adequate volume Performed at [place] [address], [place], [place] [address]    Culture  Setup Time   Final    GRAM POSITIVE COCCI IN CLUSTERS IN BOTH AEROBIC AND ANAEROBIC BOTTLES CRITICAL RESULT CALLED TO, READ BACK BY AND VERIFIED WITH: PHARMD C.SHADE AT 1100 ON 03/04/2020 BY T.SAAD    Culture (A)  Final    STAPHYLOCOCCUS HOMINIS STAPHYLOCOCCUS EPIDERMIDIS THE SIGNIFICANCE OF ISOLATING THIS ORGANISM FROM A SINGLE SET OF BLOOD CULTURES WHEN MULTIPLE SETS ARE DRAWN IS UNCERTAIN. PLEASE NOTIFY THE MICROBIOLOGY DEPARTMENT WITHIN ONE WEEK IF SPECIATION AND SENSITIVITIES ARE REQUIRED. Performed at [facility], [place] [address], [place], [place] [address]    Report Status 03/06/2020 FINAL  Final  Blood Culture ID Panel (Reflexed)     Status: Abnormal   Collection Time: 03/03/20  4:49 PM  Result Value Ref Range Status   Enterococcus faecalis NOT DETECTED NOT DETECTED Final   Enterococcus Faecium NOT DETECTED NOT DETECTED Final   Listeria monocytogenes NOT DETECTED NOT DETECTED Final   Staphylococcus species DETECTED (A) NOT DETECTED Final    Comment: CRITICAL RESULT CALLED TO, READ BACK BY AND VERIFIED WITH: PHARMD C.SHADE AT 1100 ON 03/04/2020 BY T.SAAD    Staphylococcus aureus (BCID) NOT DETECTED NOT DETECTED Final   Staphylococcus epidermidis DETECTED (A) NOT DETECTED Final    Comment: Methicillin (oxacillin) resistant coagulase  negative staphylococcus. Possible blood culture contaminant (unless isolated from more than one blood culture draw or clinical case suggests pathogenicity). No antibiotic treatment is indicated for blood  culture contaminants. CRITICAL RESULT CALLED TO, READ BACK BY AND VERIFIED WITH: PHARMD C.SHADE AT 1100 ON 03/04/2020 BY T.SAAD    Staphylococcus lugdunensis NOT DETECTED NOT DETECTED Final   Streptococcus species NOT DETECTED NOT DETECTED Final   Streptococcus agalactiae NOT DETECTED NOT DETECTED Final   Streptococcus pneumoniae NOT DETECTED NOT DETECTED Final   Streptococcus pyogenes NOT DETECTED NOT DETECTED Final   A.calcoaceticus-baumannii NOT DETECTED NOT DETECTED Final   Bacteroides fragilis NOT DETECTED NOT DETECTED Final   Enterobacterales NOT DETECTED NOT DETECTED Final   Enterobacter cloacae complex NOT DETECTED NOT DETECTED Final   Escherichia coli NOT DETECTED NOT DETECTED Final   Klebsiella aerogenes NOT DETECTED NOT DETECTED Final   Klebsiella oxytoca NOT DETECTED NOT DETECTED Final   Klebsiella pneumoniae NOT DETECTED NOT DETECTED Final   Proteus species NOT DETECTED NOT DETECTED Final   Salmonella species NOT DETECTED NOT DETECTED Final   Serratia marcescens NOT DETECTED NOT DETECTED Final   Haemophilus influenzae NOT DETECTED NOT DETECTED Final   Neisseria meningitidis NOT DETECTED NOT DETECTED Final   Pseudomonas aeruginosa NOT DETECTED NOT DETECTED Final   Stenotrophomonas maltophilia NOT DETECTED NOT DETECTED Final   Candida albicans NOT DETECTED NOT DETECTED Final   Candida auris NOT DETECTED NOT DETECTED Final   Candida glabrata NOT DETECTED NOT DETECTED Final   Candida krusei NOT DETECTED NOT DETECTED Final   Candida parapsilosis NOT DETECTED NOT DETECTED Final   Candida tropicalis NOT DETECTED NOT DETECTED Final   Cryptococcus neoformans/gattii NOT DETECTED NOT  DETECTED Final   Methicillin resistance mecA/C DETECTED (A) NOT DETECTED Final    Comment:  CRITICAL RESULT CALLED TO, READ BACK BY AND VERIFIED WITH: PHARMD C.SHADE AT 1100 ON 03/04/2020 BY T.SAAD Performed at [facility], [place] [address], [place], [place] [address]   Blood culture (routine x 2)     Status: None   Collection Time: 03/03/20  5:09 PM   Specimen: BLOOD  Result Value Ref Range Status   Specimen Description   Final    BLOOD LEFT ANTECUBITAL Performed at [place] [address], [place], [place] [address]    Special Requests   Final    BOTTLES DRAWN AEROBIC AND ANAEROBIC Blood Culture adequate volume Performed at [place] [address] [place], [place] [address]    Culture   Final    NO GROWTH 5 DAYS Performed at [facility], [place] [address] [place], [place] [address]    Report Status 03/08/2020 FINAL  Final  Culture, blood (Routine X 2) w Reflex to ID Panel     Status: None (Preliminary result)   Collection Time: 03/05/20  5:33 PM   Specimen: BLOOD  Result Value Ref Range Status   Specimen Description   Final    BLOOD LEFT ANTECUBITAL Performed at [place] [address], [place], [place] [address]    Special Requests   Final    BOTTLES DRAWN AEROBIC AND ANAEROBIC Blood Culture adequate volume Performed at [place] [address] [place], [place] [address]    Culture   Final    NO GROWTH 4 DAYS Performed at [facility], [place] [address], [place], [place] [address]    Report Status PENDING  Incomplete  Culture, blood (Routine X 2) w Reflex to ID Panel     Status: None (Preliminary result)   Collection Time: 03/05/20  5:33 PM   Specimen: BLOOD LEFT HAND  Result Value Ref Range Status   Specimen Description   Final    BLOOD LEFT HAND Performed at [place] [address] [place], [place] [address]    Special Requests   Final    BOTTLES DRAWN AEROBIC AND ANAEROBIC Blood Culture adequate volume Performed at  [place] [address], [place], [place] [address]    Culture   Final    NO GROWTH 4 DAYS Performed at [facility], [place] [address] [place], [place] [address]    Report Status PENDING  Incomplete    Procedures/Studies: DG Chest 2 View  Result Date: 03/03/2020 CLINICAL DATA:  Weakness. EXAM: CHEST - 2 VIEW COMPARISON:  September 20, 2018. FINDINGS: Stable cardiomediastinal silhouette. Right internal jugular Port-A-Cath is unchanged in position. No pneumothorax is noted. Left lung is clear. Elevated right hemidiaphragm is noted with mild right basilar subsegmental atelectasis or infiltrate. No significant pleural effusion is noted. Bony thorax is unremarkable. IMPRESSION: Elevated right hemidiaphragm with mild right basilar subsegmental atelectasis or infiltrate. Electronically Signed   By: Marijo Conception M.D.   On: 03/03/2020 12:30   CT Head Wo Contrast  Result Date: 03/03/2020 CLINICAL DATA:  Slurred speech, generalized weakness EXAM: CT HEAD WITHOUT CONTRAST TECHNIQUE: Contiguous axial images were obtained from the base of the skull through the vertex without intravenous contrast. COMPARISON:  MRI 02/15/2011 FINDINGS: Brain: No evidence of acute infarction, hemorrhage, hydrocephalus, extra-axial collection or mass lesion/mass effect. Encephalomalacia within the posterior left temporoparietal  region at site of previously seen infarct. Vascular: Atherosclerotic calcifications involving the large vessels of the skull base. No unexpected hyperdense vessel. Skull: Normal. Negative for fracture or focal lesion. Sinuses/Orbits: No acute finding. Other: None. IMPRESSION: No acute intracranial findings. Electronically Signed   By: Davina Poke D.O.   On: 03/03/2020 14:28   CT ABDOMEN PELVIS W CONTRAST  Result Date: 03/03/2020 CLINICAL DATA:  Abdominal distension. History of metastatic pancreas cancer. EXAM: CT ABDOMEN AND PELVIS WITH CONTRAST TECHNIQUE: Multidetector CT  imaging of the abdomen and pelvis was performed using the standard protocol following bolus administration of intravenous contrast. CONTRAST:  52mL OMNIPAQUE IOHEXOL 300 MG/ML  SOLN COMPARISON:  12/25/2019 FINDINGS: Lower chest: Pleuroparenchymal scarring is again noted within the right lung. No substantial change in the appearance of tiny bilateral lung nodules within the imaged portions of the lower lung zones. No acute abnormality identified. Hepatobiliary: Previously characterized subcapsular metastatic lesion is not confidently seen on today's study. No new focal liver abnormality. Within the right hepatic lobe gallbladder unremarkable. No biliary ductal dilatation. Pancreas: No signs of pancreatic inflammation or main duct dilatation. The index lesion within distal tail of pancreas measures 2.0 x 1.6 cm, image 36/2. Previously 3.0 x 1.6 cm. Spleen: Normal in size without focal abnormality. Adrenals/Urinary Tract: Normal appearance of the adrenal glands. No kidney mass or hydronephrosis identified. Similar appearance of scarring and volume loss from the inferior pole cortex of the left kidney. Urinary bladder appears normal. Stomach/Bowel: Stomach is nondistended. No bowel wall thickening, inflammation, or distension identified. Left lower quadrant colostomy identified. 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 Vascular/Lymphatic: Aortic atherosclerosis. No aneurysm. The portal vein is patent. 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 No abdominopelvic adenopathy. Reproductive: Prostate is unremarkable. Other: Interval development of large volume of ascites within the abdomen and pelvis. No discrete peritoneal nodule. Musculoskeletal: No acute or significant osseous findings. IMPRESSION: 1. Interval development of large volume of ascites within the abdomen and pelvis. No discrete peritoneal nodule identified. Etiology is indeterminate. Differential considerations include (but  not limited to): Portal venous hypertension, CHF, or peritoneal metastasis. Diagnostic and therapeutic paracentesis may be helpful for further investigation. 2. Decrease in size of distal tail of pancreas lesion. 3. Previously characterized subcapsular metastatic lesion within the liver is not confidently seen on today's study. No new focal liver abnormality identified. 4. Similar appearance of tiny bilateral lung nodules within the imaged portions of the lower lung zones. 5. Aortic atherosclerosis. Aortic Atherosclerosis (ICD10-I70.0). Electronically Signed   By: Kerby Moors M.D.   On: 03/03/2020 14:37   US Paracentesis  Result Date: 03/07/2020 INDICATION: Patient with a history of pancreatic cancer and recurrent large volume ascites. Interventional radiology asked to perform a therapeutic and diagnostic paracentesis. EXAM: ULTRASOUND GUIDED  PARACENTESIS MEDICATIONS: 1% lidocaine 10 mL COMPLICATIONS: None immediate. PROCEDURE: Informed written consent was obtained from the patient after a discussion of the risks, benefits and alternatives to treatment. A timeout was performed prior to the initiation of the procedure. Initial ultrasound scanning demonstrates a large amount of ascites within the right lower abdominal quadrant. The right lower abdomen was prepped and draped in the usual sterile fashion. 1% lidocaine was used for local anesthesia. Following this, a 19 gauge, 7-cm, Yueh catheter was introduced. An ultrasound image was saved for documentation purposes. The paracentesis was performed. The catheter was inadvertently removed midway through the procedure. Ultrasound imaging demonstrated additional fluid. The area was prepped and draped again in the usual  sterile fashion and another Teressa Lower was reinserted. The remainder of the fluid was drained without further complication. The catheter was removed and a dressing was applied. The patient tolerated the procedure well without immediate post procedural  complication. FINDINGS: A total of approximately 3.4 L of clear yellow fluid was removed. Samples were sent to the laboratory as requested by the clinical team. IMPRESSION: Successful ultrasound-guided paracentesis yielding 3.4 liters of peritoneal fluid. Read by: Soyla Dryer, NP Electronically Signed   By: Lucrezia Europe M.D.   On: 03/07/2020 13:19   US Paracentesis  Result Date: 03/03/2020 INDICATION: Patient with a history of colon cancer presents with new onset ascites. Interventional radiology asked to perform a therapeutic and diagnostic paracentesis. EXAM: ULTRASOUND GUIDED PARACENTESIS MEDICATIONS: 1% lidocaine 10 mL COMPLICATIONS: None immediate. PROCEDURE: Informed written consent was obtained from the patient after a discussion of the risks, benefits and alternatives to treatment. A timeout was performed prior to the initiation of the procedure. Initial ultrasound scanning demonstrates a large amount of ascites within the right lower abdominal quadrant. The right lower abdomen was prepped and draped in the usual sterile fashion. 1% lidocaine was used for local anesthesia. Following this, a 19 gauge, 7-cm, Yueh catheter was introduced. An ultrasound image was saved for documentation purposes. The paracentesis was performed. The catheter was removed and a dressing was applied. The patient tolerated the procedure well without immediate post procedural complication. FINDINGS: A total of approximately 5 L of clear fluid was removed. Samples were sent to the laboratory as requested by the clinical team. IMPRESSION: Successful ultrasound-guided paracentesis yielding 5 liters of peritoneal fluid. Read by: Soyla Dryer, NP Electronically Signed   By: Lucrezia Europe M.D.   On: 03/03/2020 16:31    Labs: BNP (last 3 results) Recent Labs    03/03/20 1230  BNP 272.5*   Basic Metabolic Panel: Recent Labs  Lab 03/03/20 1237 03/03/20 1238 03/05/20 0314 03/06/20 0345 03/07/20 0410 03/08/20 0326  03/09/20 0200  NA  --    < > 133* 136 133* 132* 133*  K  --    < > 3.9 3.4* 3.4* 3.5 3.6  CL  --    < > 101 102 100 100 102  CO2  --    < > 24 23 24 23 24   GLUCOSE  --    < > 102* 81 101* 106* 92  BUN  --    < > 47* 49* 51* 51* 56*  CREATININE  --    < > 1.66* 1.36* 1.53* 1.47* 1.52*  CALCIUM 8.0*   < > 7.7* 7.7* 7.7* 7.6* 7.6*  MG 2.9*  --   --   --   --   --   --   PHOS 3.9  --   --   --   --   --   --    < > = values in this interval not displayed.   Liver Function Tests: Recent Labs  Lab 03/03/20 1238 03/07/20 0410 03/08/20 0326  AST 61* 45* 42*  ALT 34 23 22  ALKPHOS 167* 118 113  BILITOT 1.6* 0.9 0.9  PROT 6.3* 5.6* 5.4*  ALBUMIN 2.5* 2.8* 2.6*   No results for input(s): LIPASE, AMYLASE in the last 168 hours. No results for input(s): AMMONIA in the last 168 hours. CBC: Recent Labs  Lab 03/03/20 1238 03/04/20 0330 03/05/20 0314 03/06/20 0345 03/07/20 0410 03/08/20 0326 03/09/20 0200  WBC 13.4*   < > 6.7 7.7 9.1 8.7 9.7  NEUTROABS 9.7*  --   --   --   --   --   --   HGB 13.1   < > 9.7* 10.1* 10.8* 10.4* 11.0*  HCT 40.4   < > 29.5* 31.1* 32.4* 31.7* 32.7*  MCV 92.7   < > 91.0 91.2 90.8 91.6 90.3  PLT 255   < > 118* 121* 128* 117* 113*   < > = values in this interval not displayed.   Cardiac Enzymes: No results for input(s): CKTOTAL, CKMB, CKMBINDEX, TROPONINI in the last 168 hours. BNP: Invalid input(s): POCBNP CBG: Recent Labs  Lab 03/05/20 0740 03/06/20 0734 03/07/20 0738 03/08/20 0741 03/09/20 0745  GLUCAP 88 87 107* 88 90   D-Dimer No results for input(s): DDIMER in the last 72 hours. Hgb A1c No results for input(s): HGBA1C in the last 72 hours. Lipid Profile No results for input(s): CHOL, HDL, LDLCALC, TRIG, CHOLHDL, LDLDIRECT in the last 72 hours. Thyroid function studies No results for input(s): TSH, T4TOTAL, T3FREE, THYROIDAB in the last 72 hours.  Invalid input(s): FREET3 Anemia work up No results for input(s): VITAMINB12, FOLATE,  FERRITIN, TIBC, IRON, RETICCTPCT in the last 72 hours. Urinalysis    Component Value Date/Time   COLORURINE YELLOW 09/14/2017 0211   APPEARANCEUR CLEAR 09/14/2017 0211   LABSPEC 1.039 (H) 09/14/2017 0211   PHURINE 5.0 09/14/2017 0211   GLUCOSEU NEGATIVE 09/14/2017 0211   HGBUR NEGATIVE 09/14/2017 0211   BILIRUBINUR NEGATIVE 09/14/2017 0211   KETONESUR 20 (A) 09/14/2017 0211   PROTEINUR 30 (A) 09/14/2017 0211   UROBILINOGEN 1.0 02/15/2011 0431   NITRITE NEGATIVE 09/14/2017 0211   LEUKOCYTESUR NEGATIVE 09/14/2017 0211   Sepsis Labs Invalid input(s): PROCALCITONIN,  WBC,  LACTICIDVEN Microbiology Recent Results (from the past 240 hour(s))  Respiratory Panel by RT PCR (Flu A&B, Covid) - Nasopharyngeal Swab     Status: None   Collection Time: 03/03/20  2:18 PM   Specimen: Nasopharyngeal Swab  Result Value Ref Range Status   SARS Coronavirus 2 by RT PCR NEGATIVE NEGATIVE Final    Comment: (NOTE) SARS-CoV-2 target nucleic acids are NOT DETECTED.  The SARS-CoV-2 RNA is generally detectable in upper respiratoy specimens during the acute phase of infection. The lowest concentration of SARS-CoV-2 viral copies this assay can detect is 131 copies/mL. A negative result does not preclude SARS-Cov-2 infection and should not be used as the sole basis for treatment or other patient management decisions. A negative result may occur with  improper specimen collection/handling, submission of specimen other than nasopharyngeal swab, presence of viral mutation(s) within the areas targeted by this assay, and inadequate number of viral copies (<131 copies/mL). A negative result must be combined with clinical observations, patient history, and epidemiological information. The expected result is Negative.  Fact Sheet for Patients:  PinkCheek.be  Fact Sheet for Healthcare Providers:  GravelBags.it  This test is no t yet approved or cleared  by the [place] FDA and  has been authorized for detection and/or diagnosis of SARS-CoV-2 by FDA under an Emergency Use Authorization (EUA). This EUA will remain  in effect (meaning this test can be used) for the duration of the COVID-19 declaration under Section 564(b)(1) of the Act, 21 U.S.C. section 360bbb-3(b)(1), unless the authorization is terminated or revoked sooner.     Influenza A by PCR NEGATIVE NEGATIVE Final   Influenza B by PCR NEGATIVE NEGATIVE Final    Comment: (NOTE) The Xpert Xpress SARS-CoV-2/FLU/RSV assay is intended as an aid in  the  diagnosis of influenza from Nasopharyngeal swab specimens and  should not be used as a sole basis for treatment. Nasal washings and  aspirates are unacceptable for Xpert Xpress SARS-CoV-2/FLU/RSV  testing.  Fact Sheet for Patients: PinkCheek.be  Fact Sheet for Healthcare Providers: GravelBags.it  This test is not yet approved or cleared by the [place] FDA and  has been authorized for detection and/or diagnosis of SARS-CoV-2 by  FDA under an Emergency Use Authorization (EUA). This EUA will remain  in effect (meaning this test can be used) for the duration of the  Covid-19 declaration under Section 564(b)(1) of the Act, 21  U.S.C. section 360bbb-3(b)(1), unless the authorization is  terminated or revoked. Performed at [facility], [place] [address], [place], [place] [address]   Body fluid culture     Status: None   Collection Time: 03/03/20  4:38 PM   Specimen: PATH Cytology Peritoneal fluid  Result Value Ref Range Status   Specimen Description   Final    PERITONEAL Performed at [place] [address] [place], [place] [address]    Special Requests   Final    NONE Performed at [facility], [name] [address], [place], [place] [address]    Gram Stain   Final    WBC PRESENT, PREDOMINANTLY  MONONUCLEAR NO ORGANISMS SEEN CYTOSPIN SMEAR Gram Stain Report Called to,Read Back By and Verified With: C.SIMPSON AT 1848 ON 03/03/20 BY N.THOMPSON Performed at [facility], [place] [address] [place], [place] [address]    Culture   Final    NO GROWTH 3 DAYS Performed at [facility], [place] [address], [place], [place] [address]    Report Status 03/07/2020 FINAL  Final  Aerobic Culture (superficial specimen)     Status: None   Collection Time: 03/03/20  4:38 PM   Specimen: PATH Cytology Peritoneal fluid  Result Value Ref Range Status   Specimen Description   Final    PERITONEAL Performed at [place] [address], [place], [place] [address]    Special Requests   Final    NONE Performed at [facility], [place] [address], [place], [place] [address]    Gram Stain NO WBC SEEN NO ORGANISMS SEEN   Final   Culture   Final    NO GROWTH 2 DAYS Performed at [facility], [place] [address], [place], [place] [address]    Report Status 03/06/2020 FINAL  Final  Blood culture (routine x 2)     Status: Abnormal   Collection Time: 03/03/20  4:49 PM   Specimen: BLOOD  Result Value Ref Range Status   Specimen Description   Final    BLOOD PORTA CATH RIGHT CHEST Performed at [place] [address] [place],  [address]    Special Requests   Final    BOTTLES DRAWN AEROBIC AND ANAEROBIC Blood Culture adequate volume Performed at [place] [address], [place], [place] [address]    Culture  Setup Time   Final    GRAM POSITIVE COCCI IN CLUSTERS IN BOTH AEROBIC AND ANAEROBIC BOTTLES CRITICAL RESULT CALLED TO, READ BACK BY AND VERIFIED WITH: PHARMD C.SHADE AT 1100 ON 03/04/2020 BY T.SAAD    Culture (A)  Final    STAPHYLOCOCCUS HOMINIS STAPHYLOCOCCUS EPIDERMIDIS THE SIGNIFICANCE OF ISOLATING THIS ORGANISM FROM A SINGLE SET OF BLOOD CULTURES WHEN MULTIPLE SETS ARE DRAWN IS  UNCERTAIN. PLEASE NOTIFY  THE MICROBIOLOGY DEPARTMENT WITHIN ONE WEEK IF SPECIATION AND SENSITIVITIES ARE REQUIRED. Performed at [facility], [place] [address], [place], [place] [address]    Report Status 03/06/2020 FINAL  Final  Blood Culture ID Panel (Reflexed)     Status: Abnormal   Collection Time: 03/03/20  4:49 PM  Result Value Ref Range Status   Enterococcus faecalis NOT DETECTED NOT DETECTED Final   Enterococcus Faecium NOT DETECTED NOT DETECTED Final   Listeria monocytogenes NOT DETECTED NOT DETECTED Final   Staphylococcus species DETECTED (A) NOT DETECTED Final    Comment: CRITICAL RESULT CALLED TO, READ BACK BY AND VERIFIED WITH: PHARMD C.SHADE AT 1100 ON 03/04/2020 BY T.SAAD    Staphylococcus aureus (BCID) NOT DETECTED NOT DETECTED Final   Staphylococcus epidermidis DETECTED (A) NOT DETECTED Final    Comment: Methicillin (oxacillin) resistant coagulase negative staphylococcus. Possible blood culture contaminant (unless isolated from more than one blood culture draw or clinical case suggests pathogenicity). No antibiotic treatment is indicated for blood  culture contaminants. CRITICAL RESULT CALLED TO, READ BACK BY AND VERIFIED WITH: PHARMD C.SHADE AT 1100 ON 03/04/2020 BY T.SAAD    Staphylococcus lugdunensis NOT DETECTED NOT DETECTED Final   Streptococcus species NOT DETECTED NOT DETECTED Final   Streptococcus agalactiae NOT DETECTED NOT DETECTED Final   Streptococcus pneumoniae NOT DETECTED NOT DETECTED Final   Streptococcus pyogenes NOT DETECTED NOT DETECTED Final   A.calcoaceticus-baumannii NOT DETECTED NOT DETECTED Final   Bacteroides fragilis NOT DETECTED NOT DETECTED Final   Enterobacterales NOT DETECTED NOT DETECTED Final   Enterobacter cloacae complex NOT DETECTED NOT DETECTED Final   Escherichia coli NOT DETECTED NOT DETECTED Final   Klebsiella aerogenes NOT DETECTED NOT DETECTED Final   Klebsiella oxytoca NOT DETECTED NOT DETECTED Final   Klebsiella  pneumoniae NOT DETECTED NOT DETECTED Final   Proteus species NOT DETECTED NOT DETECTED Final   Salmonella species NOT DETECTED NOT DETECTED Final   Serratia marcescens NOT DETECTED NOT DETECTED Final   Haemophilus influenzae NOT DETECTED NOT DETECTED Final   Neisseria meningitidis NOT DETECTED NOT DETECTED Final   Pseudomonas aeruginosa NOT DETECTED NOT DETECTED Final   Stenotrophomonas maltophilia NOT DETECTED NOT DETECTED Final   Candida albicans NOT DETECTED NOT DETECTED Final   Candida auris NOT DETECTED NOT DETECTED Final   Candida glabrata NOT DETECTED NOT DETECTED Final   Candida krusei NOT DETECTED NOT DETECTED Final   Candida parapsilosis NOT DETECTED NOT DETECTED Final   Candida tropicalis NOT DETECTED NOT DETECTED Final   Cryptococcus neoformans/gattii NOT DETECTED NOT DETECTED Final   Methicillin resistance mecA/C DETECTED (A) NOT DETECTED Final    Comment: CRITICAL RESULT CALLED TO, READ BACK BY AND VERIFIED WITH: PHARMD C.SHADE AT 1100 ON 03/04/2020 BY T.SAAD Performed at [facility], [place] [address] [place], [place] [address]   Blood culture (routine x 2)     Status: None   Collection Time: 03/03/20  5:09 PM   Specimen: BLOOD  Result Value Ref Range Status   Specimen Description   Final    BLOOD LEFT ANTECUBITAL Performed at [place] [address], [place], [place] [address]    Special Requests   Final    BOTTLES DRAWN AEROBIC AND ANAEROBIC Blood Culture adequate volume Performed at [place] [address], [place], [place] [address]    Culture   Final    NO GROWTH 5 DAYS Performed at [facility], [place] [address], [place], [place] [address]  Report Status 03/08/2020 FINAL  Final  Culture, blood (Routine X 2) w Reflex to ID Panel     Status: None (Preliminary result)   Collection Time: 03/05/20  5:33 PM   Specimen: BLOOD  Result Value Ref Range Status   Specimen Description   Final    BLOOD LEFT  ANTECUBITAL Performed at [place] [address], [place], [place] [address]    Special Requests   Final    BOTTLES DRAWN AEROBIC AND ANAEROBIC Blood Culture adequate volume Performed at [place] [address], [place], [place] [address]    Culture   Final    NO GROWTH 4 DAYS Performed at [facility], [place] [address], [place], [place] [address]    Report Status PENDING  Incomplete  Culture, blood (Routine X 2) w Reflex to ID Panel     Status: None (Preliminary result)   Collection Time: 03/05/20  5:33 PM   Specimen: BLOOD LEFT HAND  Result Value Ref Range Status   Specimen Description   Final    BLOOD LEFT HAND Performed at [place] [address] [place] [address], [place], [place] [address]    Special Requests   Final    BOTTLES DRAWN AEROBIC AND ANAEROBIC Blood Culture adequate volume Performed at [place] [address], [place], [place] [address]    Culture   Final    NO GROWTH 4 DAYS Performed at [facility], [place] [address], [place], [place] [address]    Report Status PENDING  Incomplete     Time coordinating discharge: 25 minutes  SIGNED: Antonieta Pert, MD  [facility] 03/09/2020, 1:49 PM  If 7PM-7AM, please contact night-coverage [email or web address]

## 2020-03-10 ENCOUNTER — Telehealth: Payer: Self-pay

## 2020-03-10 LAB — CULTURE, BLOOD (ROUTINE X 2)
Culture: NO GROWTH
Culture: NO GROWTH
Special Requests: ADEQUATE
Special Requests: ADEQUATE

## 2020-03-10 LAB — URINE CULTURE: Culture: NO GROWTH

## 2020-03-10 NOTE — Telephone Encounter (Signed)
Left patient voice mail to call back to schedule a follow up with Dr. Baxter Flattery within 7 days

## 2020-03-10 NOTE — Telephone Encounter (Signed)
Patient discharged from the hospital on 03/09/20. PCP has reached out to him to schedule a hospital f/u appointment. Discharge AVS is instructing the patient to have the labs through his PCP. He has appointments with Oncology scheduled. Do you still want labs here and what time frame if you do?  Thanks

## 2020-03-10 NOTE — Telephone Encounter (Signed)
-----   Message from Landis Gandy, RN sent at 03/08/2020  9:37 AM EDT ----- Please see below for hospital follow up appointment. Currently inpatient. Thank you! Sharyn Lull ----- Message ----- From: Carlyle Basques, MD Sent: 03/05/2020   5:06 PM EDT To: [facility] Triage Nurse Pool  Can I see back in 7 days

## 2020-03-12 NOTE — Telephone Encounter (Signed)
Beth, patient was recently diagnosed with cirrhosis while in the hospital. He was previously known to Dr. Hilarie Fredrickson who did his flex sig and colonoscopy in 2019. However, I see he has a hospital follow up appt with Dr. Carlean Purl on 10/18. He can get labs done at the time of this appt if not already done by his pcp. THX.  Dr. Carlean Purl, pls verify if you are in agreement to see this patient as scheduled. Thx   I cc'd Dr. Hilarie Fredrickson on this msg as well.

## 2020-03-14 LAB — CULTURE, BLOOD (ROUTINE X 2)
Culture: NO GROWTH
Culture: NO GROWTH
Special Requests: ADEQUATE
Special Requests: ADEQUATE

## 2020-03-15 ENCOUNTER — Ambulatory Visit: Payer: 59 | Admitting: Internal Medicine

## 2020-03-15 NOTE — Telephone Encounter (Signed)
I am fine to see him in my current situation of working patients in

## 2020-03-18 ENCOUNTER — Encounter: Payer: Self-pay | Admitting: Nurse Practitioner

## 2020-03-18 ENCOUNTER — Inpatient Hospital Stay (HOSPITAL_BASED_OUTPATIENT_CLINIC_OR_DEPARTMENT_OTHER): Payer: Medicare Other | Admitting: Nurse Practitioner

## 2020-03-18 ENCOUNTER — Inpatient Hospital Stay: Payer: Medicare Other

## 2020-03-18 ENCOUNTER — Other Ambulatory Visit: Payer: Self-pay

## 2020-03-18 VITALS — BP 138/91 | HR 96 | Temp 97.2°F | Resp 16 | Ht 66.0 in | Wt 216.6 lb

## 2020-03-18 DIAGNOSIS — Z5111 Encounter for antineoplastic chemotherapy: Secondary | ICD-10-CM | POA: Diagnosis not present

## 2020-03-18 DIAGNOSIS — C187 Malignant neoplasm of sigmoid colon: Secondary | ICD-10-CM | POA: Diagnosis not present

## 2020-03-18 DIAGNOSIS — C252 Malignant neoplasm of tail of pancreas: Secondary | ICD-10-CM

## 2020-03-18 DIAGNOSIS — C787 Secondary malignant neoplasm of liver and intrahepatic bile duct: Secondary | ICD-10-CM | POA: Diagnosis not present

## 2020-03-18 LAB — CMP (CANCER CENTER ONLY)
ALT: 13 U/L (ref 0–44)
AST: 36 U/L (ref 15–41)
Albumin: 2.5 g/dL — ABNORMAL LOW (ref 3.5–5.0)
Alkaline Phosphatase: 126 U/L (ref 38–126)
Anion gap: 6 (ref 5–15)
BUN: 37 mg/dL — ABNORMAL HIGH (ref 8–23)
CO2: 29 mmol/L (ref 22–32)
Calcium: 8.3 mg/dL — ABNORMAL LOW (ref 8.9–10.3)
Chloride: 106 mmol/L (ref 98–111)
Creatinine: 1.5 mg/dL — ABNORMAL HIGH (ref 0.61–1.24)
GFR, Estimated: 50 mL/min — ABNORMAL LOW (ref >60)
Glucose, Bld: 119 mg/dL — ABNORMAL HIGH (ref 70–99)
Potassium: 4.2 mmol/L (ref 3.5–5.1)
Sodium: 141 mmol/L (ref 135–145)
Total Bilirubin: 1.5 mg/dL — ABNORMAL HIGH (ref 0.3–1.2)
Total Protein: 6.2 g/dL — ABNORMAL LOW (ref 6.5–8.1)

## 2020-03-18 LAB — CBC WITH DIFFERENTIAL (CANCER CENTER ONLY)
Abs Immature Granulocytes: 0.02 10*3/uL (ref 0.00–0.07)
Basophils Absolute: 0.1 10*3/uL (ref 0.0–0.1)
Basophils Relative: 1 %
Eosinophils Absolute: 0.6 10*3/uL — ABNORMAL HIGH (ref 0.0–0.5)
Eosinophils Relative: 6 %
HCT: 39.5 % (ref 39.0–52.0)
Hemoglobin: 12.6 g/dL — ABNORMAL LOW (ref 13.0–17.0)
Immature Granulocytes: 0 %
Lymphocytes Relative: 10 %
Lymphs Abs: 0.9 10*3/uL (ref 0.7–4.0)
MCH: 30.1 pg (ref 26.0–34.0)
MCHC: 31.9 g/dL (ref 30.0–36.0)
MCV: 94.5 fL (ref 80.0–100.0)
Monocytes Absolute: 1.4 10*3/uL — ABNORMAL HIGH (ref 0.1–1.0)
Monocytes Relative: 14 %
Neutro Abs: 6.7 10*3/uL (ref 1.7–7.7)
Neutrophils Relative %: 69 %
Platelet Count: 115 10*3/uL — ABNORMAL LOW (ref 150–400)
RBC: 4.18 MIL/uL — ABNORMAL LOW (ref 4.22–5.81)
RDW: 20.5 % — ABNORMAL HIGH (ref 11.5–15.5)
WBC Count: 9.6 10*3/uL (ref 4.0–10.5)
nRBC: 0 % (ref 0.0–0.2)

## 2020-03-18 NOTE — Progress Notes (Signed)
[place] OFFICE PROGRESS NOTE   Diagnosis: Pancreas cancer  INTERVAL HISTORY:   Alex [name] returns as scheduled.  He was hospitalized 03/03/2020 through 03/09/2020 with progressive weakness, fall, abdominal distention.  CT showed development of large volume ascites, no evidence of disease progression.  He underwent a paracentesis on 03/03/2020 and 03/07/2020.  Cytology on both showed reactive mesothelial cells.  Dr. Gearldine Shown recommendation was to continue gemcitabine based chemotherapy as an outpatient.  He is seen today prior to resuming treatment.  Appetite varies.  His sister does not feel he is eating enough.  He remains very weak.  Abdomen is distended.  He feels he needs a paracentesis.  The abdominal distention is causing a problem with adherence of the colostomy bag and stool has been leaking onto the skin.  No nausea or vomiting at home.  He vomited upon arrival to the office today.  He denies pain.  No fever.  No cough or shortness of breath.  He reports constant numbness in the hands and feet.  Objective:  Vital signs in last 24 hours:  Blood pressure (!) 138/91, pulse 96, temperature (!) 97.2 F (36.2 C), temperature source Tympanic, resp. rate 16, height _0  (1.676 m), weight 216 lb 9.6 oz (98.2 kg), SpO2 98 %.    HEENT: No thrush or ulcers. Resp: Breath sounds diminished right lower lung field.  No respiratory distress. Cardio: Regular rate and rhythm. GI: Abdomen is distended consistent with ascites.  Liquid/semiformed stool in the colostomy collection bag. Vascular: Trace edema at the lower legs bilaterally. Neuro: Alert and oriented. Skin: Skin medial and inferior to the colostomy bag is erythematous, dried stool noted. Port-A-Cath without erythema.   Lab Results:  Lab Results  Component Value Date   WBC 9.6 03/18/2020   HGB 12.6 (L) 03/18/2020   HCT 39.5 03/18/2020   MCV 94.5 03/18/2020   PLT 115 (L) 03/18/2020   NEUTROABS 6.7 03/18/2020      Imaging:  No results found.  Medications: I have reviewed the patient's current medications.  Assessment/Plan: 1. 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 Well-differentiated adenocarcinoma, 0/13 lymph nodes positive, MSI-stable, no loss of mismatch repair protein expression ? Tumor involving the rectosigmoid junction ? Sigmoidoscopy 09/18/2017-distal sigmoid colon tumor beginning between 15 and 20 cm from the dentate line, completely obstructing ? CT abdomen/pelvis 09/14/2017-sigmoid thickening no evidence of metastatic disease ? 2 mm right upper lobe nodule on CT 08/04/2017 ? Colonoscopy 02/07/2018-polyps removed from the cecum, ascending colon, transverse colon, descending colon, rectosigmoid colon and rectum (tubular adenomas)  2. Right lung pneumonia/empyema March 2019  Right VATS, drainage of empyema, and decortication of the right lower lobe 08/31/2017  3.CVA in 2010  4.Rectal and active sigmoid polyps noted on flexible sigmoidoscopy 09/18/2017  5.Pancreas tail mass/right liver lesions and 4 mm splenic lesion on CT abdomen/pelvis 05/27/2018  MRI abdomen 07/27/2018-right liver lesions, rim-enhancing lesion in the pancreas tail  Ultrasound-guided biopsy of a right liver lesion 08/14/2018-metastatic adenocarcinoma, cytokeratin 7+, CDX-2 weak positive, cytokeratin 20 negative, consistent with metastatic pancreas cancer; MSI stable; tumor mutational burden 0  CT abdomen/pelvis 09/26/2018-increased size of previously noted liver lesions, new right liver lesion, increased size of pancreas mass with occlusion of the splenic vein  Cycle 1 gemcitabine/Abraxane 10/11/2018  Cycle 2 gemcitabine/Abraxane 10/25/2018  Cycle 3 gemcitabine/Abraxane 11/08/2018  Cycle 4 gemcitabine/Abraxane 11/22/2018  Cycle 5 gemcitabine/Abraxane 12/06/2018  CT 12/18/2018-overall improved with reduced size  of the  hepatic metastatic lesions, mildly reduced size of the pancreatic tail mass.  Cycle 6 gemcitabine/Abraxane 12/20/2018  Cycle 7 gemcitabine/Abraxane 01/03/2019  Cycle 8 gemcitabine/Abraxane 01/17/2019  Cycle 9 gemcitabine/Abraxane 01/31/2019  Cycle 10 gemcitabine/Abraxane 02/13/2019  Cycle 11 gemcitabine/Abraxane 02/28/2019 (Abraxane held secondary to neuropathy symptoms)  Cycle 12 gemcitabine/Abraxane 03/14/2019 (Abraxane held due to persistent neuropathy symptoms)  Cycle 13 gemcitabine/Abraxane 03/28/2019 (Abraxane held due to persistent neuropathy symptoms)  Cycle 14 gemcitabine/Abraxane 04/11/2019 (Abraxane held due to persistent neuropathy symptoms)  CT abdomen/pelvis 04/28/2019-decrease in size of pancreatic mass. Decrease in size of hepatic metastatic foci.   Cycle 15 gemcitabine12/05/2018(Abraxane held due to neuropathy symptoms)  Cycle 16 gemcitabine 05/16/2019 (Abraxane held due to neuropathy symptoms)  Cycle 17 gemcitabine 05/31/2019 (Abraxane on hold due to neuropathy symptoms)  Cycle 18 gemcitabine 06/13/2019 (Abraxane held)  Cycle 19 gemcitabine 06/27/2019 (Abraxane held)  Cycle 20 gemcitabine 07/11/2019 (Abraxane held)  Cycle 21 gemcitabine 07/25/2019 (Abraxane held)  CTabdomen/pelvis 08/05/2019-stable small liver lesions, stable pancreas mass, no evidence of disease progression  Cycle 22 gemcitabine 08/08/2019 (Abraxane held)  Cycle 23 gemcitabine 08/22/2019 (Abraxane held)  Cycle 24 gemcitabine 09/05/2019 (Abraxane held)  Cycle 25 gemcitabine 09/19/2019 (Abraxane held)  CT abdomen/pelvis 09/30/2019-no change in pancreas mass, slight enlargement of right liver metastases, largest measuring 1 cm, no new lesions  Cycle 26 gemcitabine/Abraxane 10/03/2019-Abraxane resumed at a dose of 100 mg/m  Cycle 27 gemcitabine/Abraxane 10/17/2019  Cycle 28 gemcitabine/Abraxane 10/31/2019  Cycle 29 gemcitabine/Abraxane 11/14/2019  Cycle 30 gemcitabine/Abraxane 11/28/2019  Cycle  31 gemcitabine/Abraxane 12/12/2019  CT abdomen/pelvis 12/25/2019-stable right hepatic lesion, no new liver lesions, stable pancreas tail mass, tiny bilateral lung base nodules, new compared to a chest CT 09/25/2017 and some have progressed compared to a's CT abdomen 08/05/2019  Cycle 32 gemcitabine/Abraxane 01/02/2020 (chemotherapy dose reduced due to neutropenia)  Cycle 33 gemcitabine/Abraxane 01/23/2020  Cycle 34 gemcitabine 02/13/2020 (Abraxane held secondary to neuropathy)  CT abdomen/pelvis 03/03/2020-no significant change in the appearance of tiny bilateral lung nodules at the lower lung zones.  Previously characterized subcapsular metastatic liver lesion not seen on current study.  No new focal liver abnormality.  No biliary ductal dilatation.  Index lesion distal tail of pancreas smaller.  Interval development of large volume ascites within the abdomen and pelvis.  No discrete peritoneal nodule.  Portal vein patent.  Esophageal and gastric varices identified similar to prior exam.  6.Port-A-Cath placement by Dr. 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  Hospitalization 03/03/2020 with weakness, marked abdominal distention   CT abdomen/pelvis 03/03/2020-no significant change in the appearance of tiny bilateral lung nodules at the lower lung zones.  Previously characterized subcapsular metastatic liver lesion not seen on current study.  No new focal liver abnormality.  No biliary ductal dilatation.  Index lesion distal tail of pancreas smaller.  Interval development of large volume ascites within the abdomen and pelvis.  No discrete peritoneal nodule.  Portal vein patent.  Esophageal and gastric varices identified similar to prior exam.  Brain CT 03/03/2020-negative  Paracentesis 03/03/2020-5 L of fluid removed.  Cytology negative.  Paracentesis 03/07/2020-3.4 L of fluid removed.  Reactive  mesothelial cells.  10.  1 set of blood cultures from the Port-A-Cath positive for staph epidermidis-treated with course of vancomycin 11.  Mild thrombocytopenia-likely secondary to chemotherapy  Disposition: Alex [name] has metastatic pancreas cancer.  He is seen today prior to resuming chemotherapy. His performance status is poor.  We are holding today's treatment and rescheduling  for 1 week.  He will work on activity and nutrition.  CBC from today reviewed.  We will follow-up on the chemistry panel and CA 19-9.  He has recurrent ascites.  We are referring him for a therapeutic/diagnostic paracentesis.  He will return for lab, follow-up, possible gemcitabine in 1 week.  We are available to see him sooner if needed.    Ned Card ANP/GNP-BC   03/18/2020  2:49 PM

## 2020-03-19 ENCOUNTER — Telehealth: Payer: Self-pay | Admitting: Nurse Practitioner

## 2020-03-19 ENCOUNTER — Ambulatory Visit (HOSPITAL_COMMUNITY)
Admission: RE | Admit: 2020-03-19 | Discharge: 2020-03-19 | Disposition: A | Discharge status: Home / Self Care | Payer: Medicare Other | Source: Ambulatory Visit | Attending: Nurse Practitioner | Admitting: Nurse Practitioner

## 2020-03-19 DIAGNOSIS — C252 Malignant neoplasm of tail of pancreas: Secondary | ICD-10-CM

## 2020-03-19 LAB — CANCER ANTIGEN 19-9: CA 19-9: 84 U/mL — ABNORMAL HIGH (ref 0–35)

## 2020-03-19 IMAGING — CT CT CHEST W/ CM
2 of 3 series · 15 of 36 positions shown, 18 images · IV contrast (Omni 300)
Comparison: 08/25/2017 chest radiograph and 08/04/2017 chest CT

CLINICAL DATA: 66-year-old male with cough and RIGHT chest pain.
RIGHT pleural effusion identified on recent chest radiograph.

EXAM:
CT CHEST WITH CONTRAST
TECHNIQUE: Multidetector CT imaging of the chest was performed during
intravenous contrast administration.
CONTRAST:  100mL F5WSIV-OVV IOPAMIDOL (F5WSIV-OVV) INJECTION 61%

[Series 3: chest with 2mm st · axial · 0.83mm/px · z∈[+1110,+1396]mm · 12 of 169 slices shown, 15 images]
[im 13/169  mediastinal]
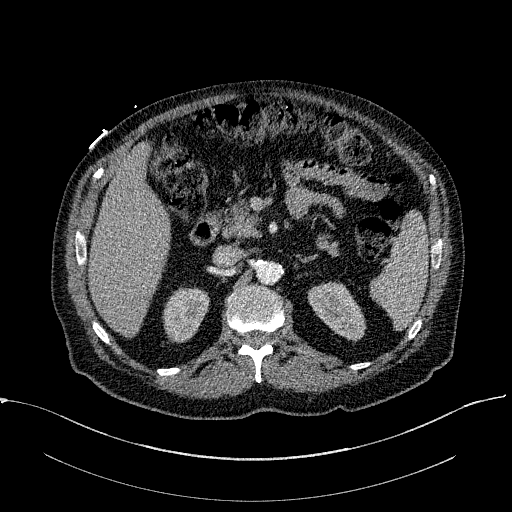
[im 13/169  lung]
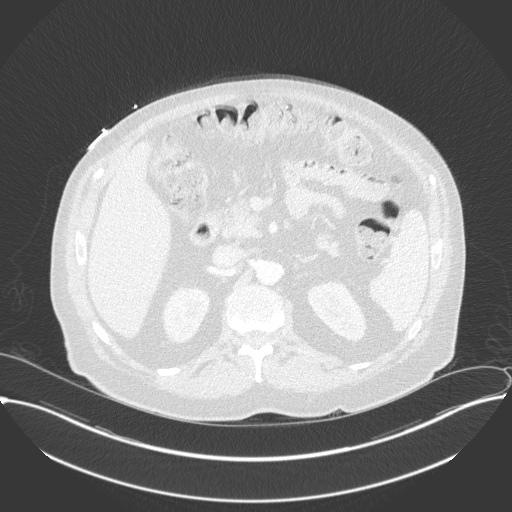
[im 25/169  lung]
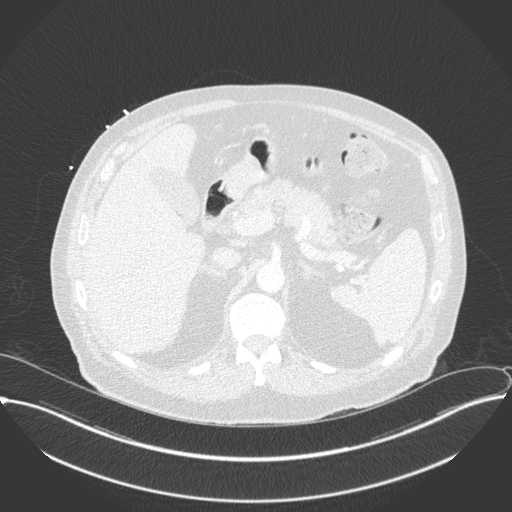
[im 38/169  lung]
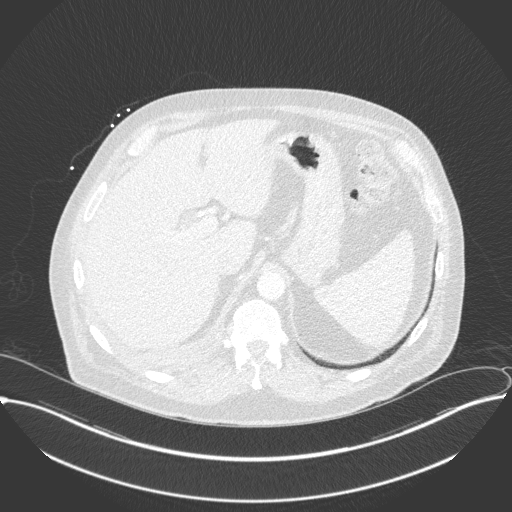
[im 50/169  lung]
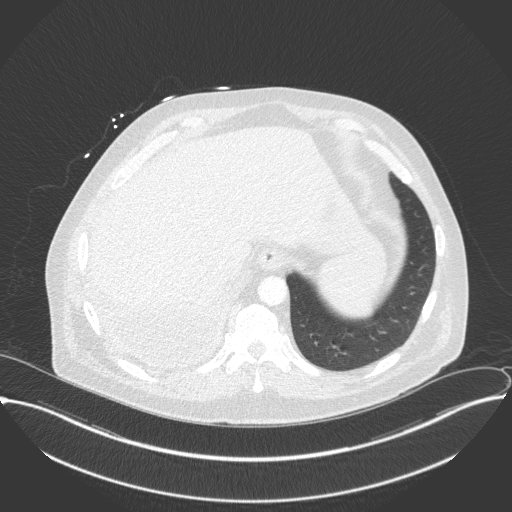
[im 63/169  mediastinal]
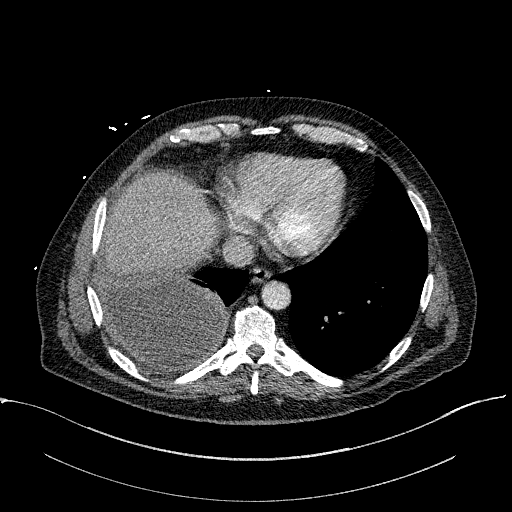
[im 63/169  lung]
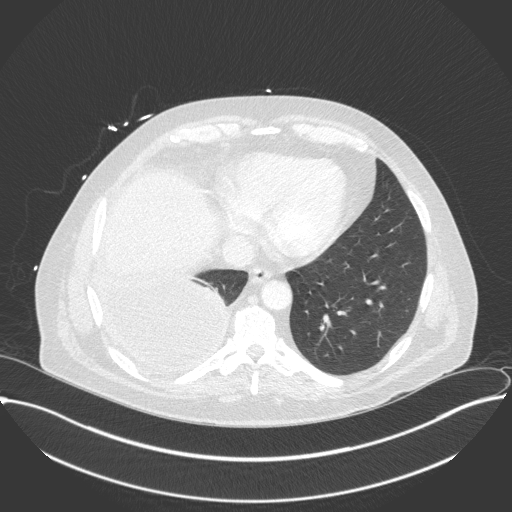
[im 75/169  lung]
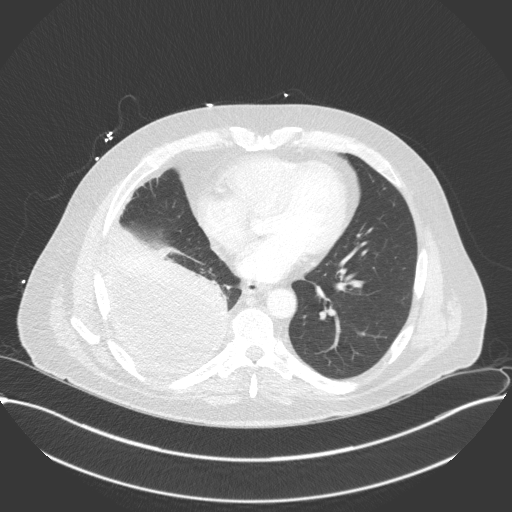
[im 94/169  lung]
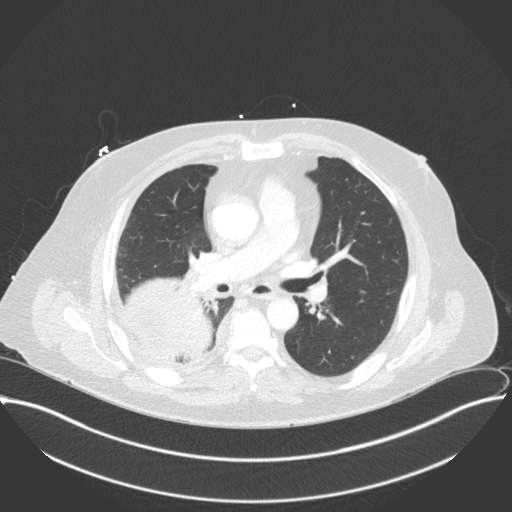
[im 106/169  lung]
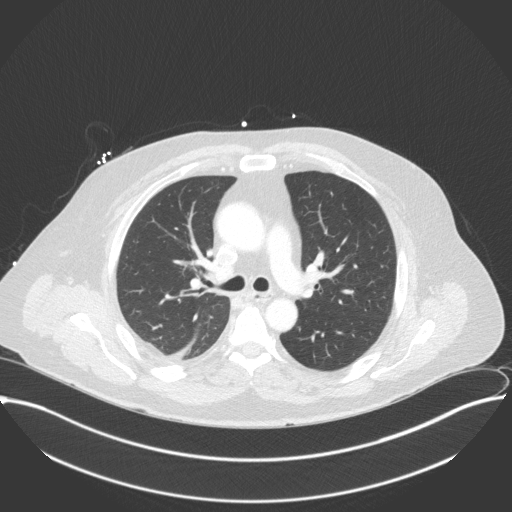
[im 119/169  mediastinal]
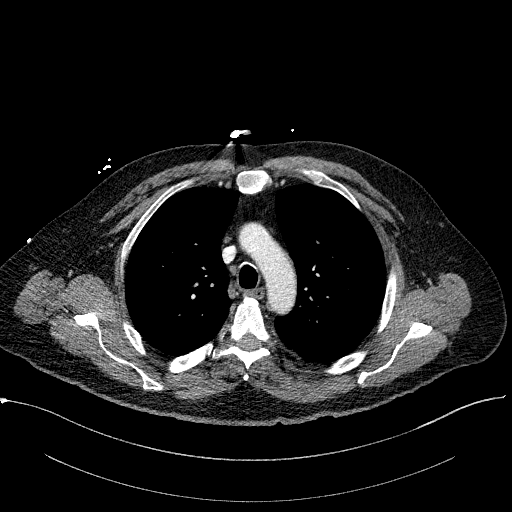
[im 119/169  lung]
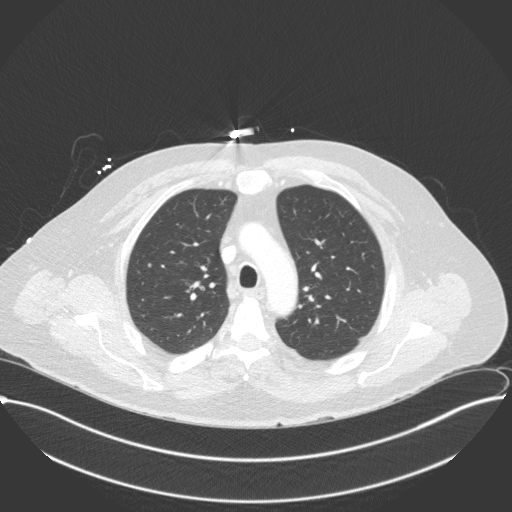
[im 131/169  lung]
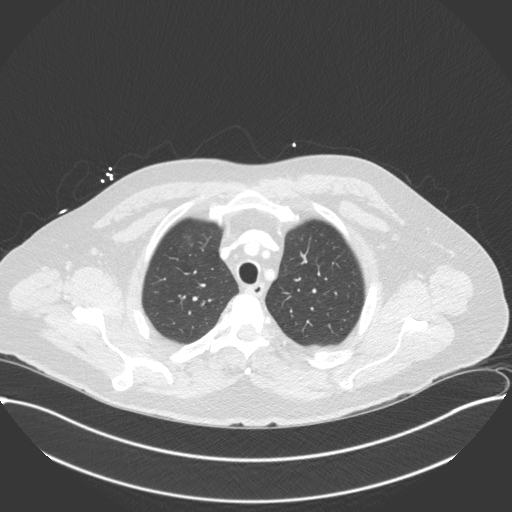
[im 144/169  lung]
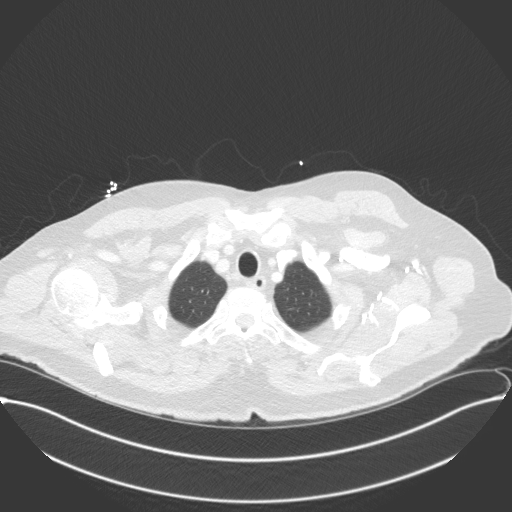
[im 156/169  lung]
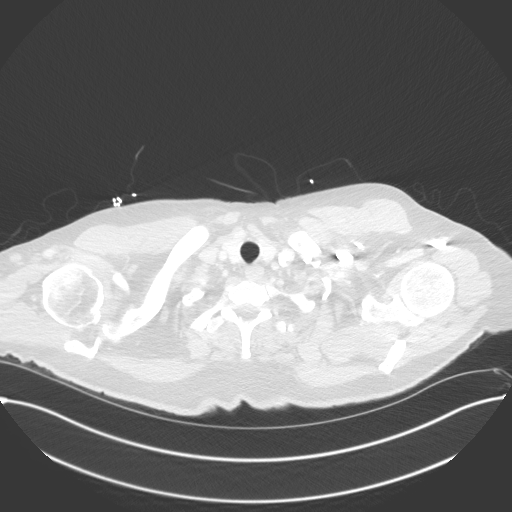

[Series 5: chest with 3mm st cor · coronal · 0.66mm/px · 3 of 101 slices shown]
[im 21/101  lung]
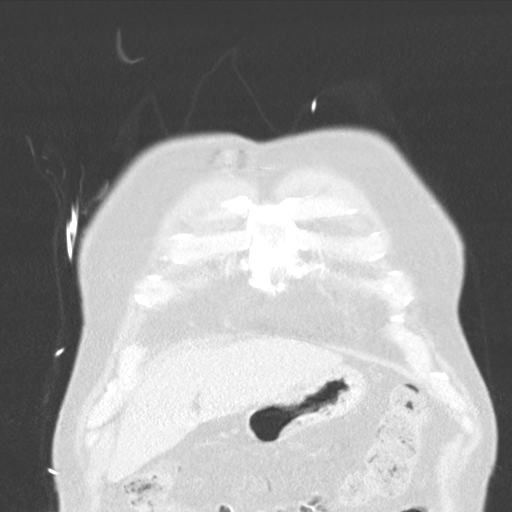
[im 41/101  lung]
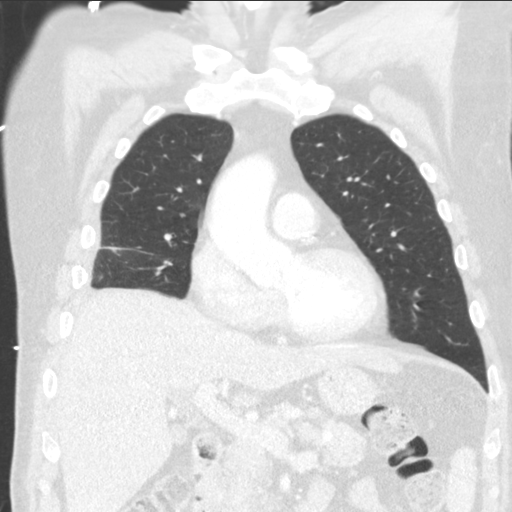
[im 61/101  lung]
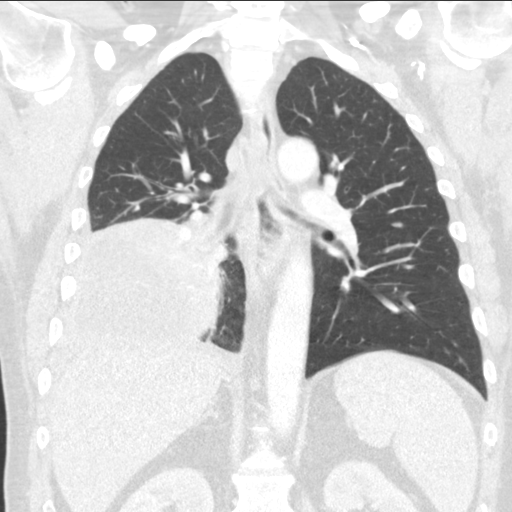

[15 of 36 positions shown; findings below may reference images not displayed]

FINDINGS: Cardiovascular: UPPER limits normal heart size again noted. Coronary
artery and thoracic aortic atherosclerotic calcifications are again
noted. There is no evidence of thoracic aortic aneurysm or
pericardial effusion.

Mediastinum/Nodes: No enlarged mediastinal, hilar, or axillary lymph
nodes. Thyroid gland, trachea, and esophagus demonstrate no
significant findings.

Lungs/Pleura: A moderate loculated RIGHT pleural effusion has
slightly enlarged, now measuring 6 x 10.5 x 10.8 cm. Adjacent RIGHT
LOWER lobe consolidation/atelectasis again noted.

No suspicious nodule, discrete mass, LEFT pleural effusion or
pneumothorax noted.

Upper Abdomen: No acute abnormality.

Musculoskeletal: No chest wall abnormality. No acute or significant
osseous findings.
IMPRESSION: 1. Slight enlargement of moderate loculated RIGHT pleural effusion
suspicious for empyema. Adjacent RIGHT LOWER lobe
consolidation/atelectasis.
2. Coronary artery and Aortic Atherosclerosis (SCHEM-T0H.H).

## 2020-03-19 MED ORDER — LIDOCAINE HCL 1 % IJ SOLN
INTRAMUSCULAR | Status: AC
  Filled 2020-03-19: qty 20

## 2020-03-19 NOTE — Telephone Encounter (Signed)
Scheduled appointments per 10/21 los. Called patient, no answer. Left message with appointment date and time.

## 2020-03-19 NOTE — Procedures (Signed)
Ultrasound-guided  therapeutic paracentesis performed yielding 5 liters (maximum ordered) of yellow fluid. No immediate complications.EBL none.

## 2020-03-21 ENCOUNTER — Other Ambulatory Visit: Payer: Self-pay | Admitting: Oncology

## 2020-03-23 IMAGING — DX DG CHEST 1V PORT
1 series · 1 of 1 positions shown · non-contrast
Comparison: Portable exam 0328 hours compared to 08/30/2017

CLINICAL DATA: Post thoracotomy

EXAM:
PORTABLE CHEST 1 VIEW

[chest ap]
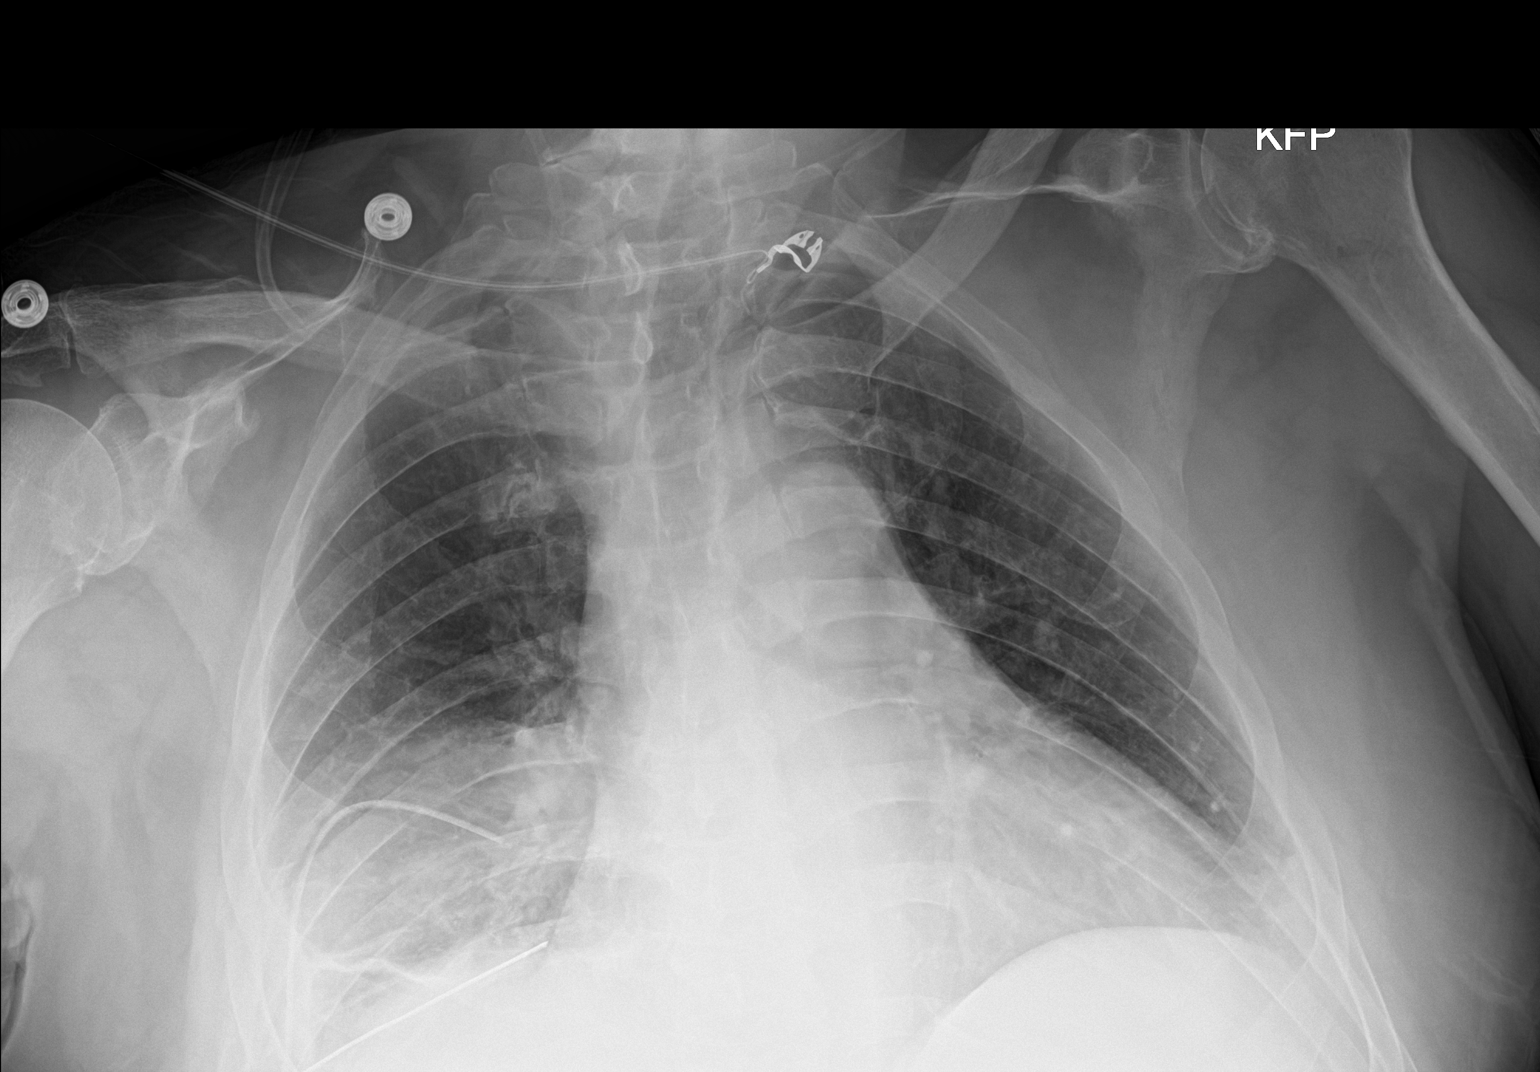

[1 of 1 positions shown; findings below may reference images not displayed]

FINDINGS: Pair of thoracostomy tubes at inferior RIGHT hemithorax.

Enlargement of cardiac silhouette.

Mediastinal contours and pulmonary vascularity normal.

RIGHT basilar atelectasis and question minimal pleural effusion.

No pneumothorax.

LEFT lung clear.

Bones unremarkable.
IMPRESSION: RIGHT basilar atelectasis and thoracostomy tubes with question
minimal pleural effusion.

Enlargement of cardiac silhouette.

## 2020-03-25 ENCOUNTER — Other Ambulatory Visit: Payer: 59

## 2020-03-25 ENCOUNTER — Other Ambulatory Visit: Payer: Self-pay

## 2020-03-25 ENCOUNTER — Inpatient Hospital Stay (HOSPITAL_BASED_OUTPATIENT_CLINIC_OR_DEPARTMENT_OTHER): Payer: Medicare Other | Admitting: Nurse Practitioner

## 2020-03-25 ENCOUNTER — Inpatient Hospital Stay: Payer: Medicare Other

## 2020-03-25 ENCOUNTER — Encounter: Payer: Self-pay | Admitting: Nurse Practitioner

## 2020-03-25 VITALS — BP 128/85 | HR 78 | Temp 97.8°F | Resp 17 | Ht 66.0 in | Wt 208.5 lb

## 2020-03-25 DIAGNOSIS — Z5111 Encounter for antineoplastic chemotherapy: Secondary | ICD-10-CM | POA: Diagnosis not present

## 2020-03-25 DIAGNOSIS — C252 Malignant neoplasm of tail of pancreas: Secondary | ICD-10-CM

## 2020-03-25 DIAGNOSIS — C187 Malignant neoplasm of sigmoid colon: Secondary | ICD-10-CM

## 2020-03-25 DIAGNOSIS — Z95828 Presence of other vascular implants and grafts: Secondary | ICD-10-CM

## 2020-03-25 LAB — CMP (CANCER CENTER ONLY)
ALT: 15 U/L (ref 0–44)
AST: 36 U/L (ref 15–41)
Albumin: 2.3 g/dL — ABNORMAL LOW (ref 3.5–5.0)
Alkaline Phosphatase: 112 U/L (ref 38–126)
Anion gap: 9 (ref 5–15)
BUN: 22 mg/dL (ref 8–23)
CO2: 27 mmol/L (ref 22–32)
Calcium: 7.9 mg/dL — ABNORMAL LOW (ref 8.9–10.3)
Chloride: 101 mmol/L (ref 98–111)
Creatinine: 1.28 mg/dL — ABNORMAL HIGH (ref 0.61–1.24)
GFR, Estimated: >60 mL/min (ref >60)
Glucose, Bld: 97 mg/dL (ref 70–99)
Potassium: 3.8 mmol/L (ref 3.5–5.1)
Sodium: 137 mmol/L (ref 135–145)
Total Bilirubin: 1.5 mg/dL — ABNORMAL HIGH (ref 0.3–1.2)
Total Protein: 6 g/dL — ABNORMAL LOW (ref 6.5–8.1)

## 2020-03-25 LAB — CBC WITH DIFFERENTIAL (CANCER CENTER ONLY)
Abs Immature Granulocytes: 0.05 10*3/uL (ref 0.00–0.07)
Basophils Absolute: 0.1 10*3/uL (ref 0.0–0.1)
Basophils Relative: 1 %
Eosinophils Absolute: 0.6 10*3/uL — ABNORMAL HIGH (ref 0.0–0.5)
Eosinophils Relative: 5 %
HCT: 39 % (ref 39.0–52.0)
Hemoglobin: 12.7 g/dL — ABNORMAL LOW (ref 13.0–17.0)
Immature Granulocytes: 1 %
Lymphocytes Relative: 12 %
Lymphs Abs: 1.3 10*3/uL (ref 0.7–4.0)
MCH: 30.2 pg (ref 26.0–34.0)
MCHC: 32.6 g/dL (ref 30.0–36.0)
MCV: 92.9 fL (ref 80.0–100.0)
Monocytes Absolute: 1.4 10*3/uL — ABNORMAL HIGH (ref 0.1–1.0)
Monocytes Relative: 13 %
Neutro Abs: 7.7 10*3/uL (ref 1.7–7.7)
Neutrophils Relative %: 68 %
Platelet Count: 128 10*3/uL — ABNORMAL LOW (ref 150–400)
RBC: 4.2 MIL/uL — ABNORMAL LOW (ref 4.22–5.81)
RDW: 19.1 % — ABNORMAL HIGH (ref 11.5–15.5)
WBC Count: 11.1 10*3/uL — ABNORMAL HIGH (ref 4.0–10.5)
nRBC: 0 % (ref 0.0–0.2)

## 2020-03-25 MED ORDER — PROCHLORPERAZINE MALEATE 10 MG PO TABS
10.0000 mg | ORAL_TABLET | Freq: Once | ORAL | Status: AC
  Administered 2020-03-25: 10 mg via ORAL

## 2020-03-25 MED ORDER — HEPARIN SOD (PORK) LOCK FLUSH 100 UNIT/ML IV SOLN
500.0000 [IU] | Freq: Once | INTRAVENOUS | Status: AC | PRN
  Administered 2020-03-25: 500 [IU]
  Filled 2020-03-25: qty 5

## 2020-03-25 MED ORDER — SODIUM CHLORIDE 0.9 % IV SOLN
Freq: Once | INTRAVENOUS | Status: AC
  Filled 2020-03-25: qty 250

## 2020-03-25 MED ORDER — SODIUM CHLORIDE 0.9 % IV SOLN
800.0000 mg/m2 | Freq: Once | INTRAVENOUS | Status: AC
  Administered 2020-03-25: 1634 mg via INTRAVENOUS
  Filled 2020-03-25: qty 42.98

## 2020-03-25 MED ORDER — SODIUM CHLORIDE 0.9% FLUSH
10.0000 mL | INTRAVENOUS | Status: DC | PRN
  Administered 2020-03-25: 10 mL
  Filled 2020-03-25: qty 10

## 2020-03-25 MED ORDER — PROCHLORPERAZINE MALEATE 10 MG PO TABS
ORAL_TABLET | ORAL | Status: AC
  Filled 2020-03-25: qty 1

## 2020-03-25 NOTE — Progress Notes (Addendum)
[place] OFFICE PROGRESS NOTE   Diagnosis: Pancreas cancer  INTERVAL HISTORY:   Alex [name] returns as scheduled.  He is feeling better.  He is up with a walker.  He is working with physical therapy.  Appetite is stable.  He is eating 3 meals a day.  Abdomen is less distended.  No diarrhea.  He has persistent neuropathy symptoms in the hands and feet.  Objective:  Vital signs in last 24 hours:  Blood pressure 128/85, pulse 78, temperature 97.8 F (36.6 C), temperature source Tympanic, resp. rate 17, height _0  (1.676 m), weight 208 lb 8 oz (94.6 kg), SpO2 97 %.    HEENT: No thrush or ulcers. Resp: Lungs are clear.  Breath sounds slightly diminished at the right lower lung field.  No respiratory distress. Cardio: Regular rate and rhythm. GI: Abdomen is distended, exam consistent with ascites.  Left lower quadrant colostomy. Vascular: Trace edema at the lower legs bilaterally. Neuro: Alert and oriented. Skin: Skin medial to the colostomy has faint erythema with a few areas of superficial ulceration. Port-A-Cath without erythema.   Lab Results:  Lab Results  Component Value Date   WBC 11.1 (H) 03/25/2020   HGB 12.7 (L) 03/25/2020   HCT 39.0 03/25/2020   MCV 92.9 03/25/2020   PLT 128 (L) 03/25/2020   NEUTROABS 7.7 03/25/2020    Imaging:  No results found.  Medications: I have reviewed the patient's current medications.  Assessment/Plan: 1. 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 Well-differentiated adenocarcinoma, 0/13 lymph nodes positive, MSI-stable, no loss of mismatch repair protein expression ? Tumor involving the rectosigmoid junction ? Sigmoidoscopy 09/18/2017-distal sigmoid colon tumor beginning between 15 and 20 cm from the dentate line, completely obstructing ? CT abdomen/pelvis 09/14/2017-sigmoid thickening no evidence of metastatic disease ? 2 mm right  upper lobe nodule on CT 08/04/2017 ? Colonoscopy 02/07/2018-polyps removed from the cecum, ascending colon, transverse colon, descending colon, rectosigmoid colon and rectum (tubular adenomas)  2. Right lung pneumonia/empyema March 2019  Right VATS, drainage of empyema, and decortication of the right lower lobe 08/31/2017  3.CVA in 2010  4.Rectal and active sigmoid polyps noted on flexible sigmoidoscopy 09/18/2017  5.Pancreas tail mass/right liver lesions and 4 mm splenic lesion on CT abdomen/pelvis 05/27/2018  MRI abdomen 07/27/2018-right liver lesions, rim-enhancing lesion in the pancreas tail  Ultrasound-guided biopsy of a right liver lesion 08/14/2018-metastatic adenocarcinoma, cytokeratin 7+, CDX-2 weak positive, cytokeratin 20 negative, consistent with metastatic pancreas cancer; MSI stable; tumor mutational burden 0  CT abdomen/pelvis 09/26/2018-increased size of previously noted liver lesions, new right liver lesion, 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  Cycle 6 gemcitabine/Abraxane 12/20/2018  Cycle 7 gemcitabine/Abraxane 01/03/2019  Cycle 8 gemcitabine/Abraxane 01/17/2019  Cycle 9 gemcitabine/Abraxane 01/31/2019  Cycle 10 gemcitabine/Abraxane 02/13/2019  Cycle 11 gemcitabine/Abraxane 02/28/2019 (Abraxane held secondary to neuropathy symptoms)  Cycle 12 gemcitabine/Abraxane 03/14/2019 (Abraxane held due to persistent neuropathy symptoms)  Cycle 13 gemcitabine/Abraxane 03/28/2019 (Abraxane held due to persistent neuropathy symptoms)  Cycle 14 gemcitabine/Abraxane 04/11/2019 (Abraxane held due to  persistent neuropathy symptoms)  CT abdomen/pelvis 04/28/2019-decrease in size of pancreatic mass. Decrease in size of hepatic metastatic foci.  Cycle 15 gemcitabine12/05/2018(Abraxane held due to neuropathy symptoms)  Cycle 16 gemcitabine 05/16/2019 (Abraxane held due to neuropathy symptoms)  Cycle 17 gemcitabine 05/31/2019 (Abraxane on hold due to neuropathy symptoms)  Cycle 18 gemcitabine 06/13/2019 (Abraxane held)  Cycle 19 gemcitabine 06/27/2019 (Abraxane held)  Cycle 20 gemcitabine 07/11/2019 (Abraxane held)  Cycle 21 gemcitabine 07/25/2019 (Abraxane held)  CTabdomen/pelvis 08/05/2019-stable small liver lesions, stable pancreas mass, no evidence of disease progression  Cycle 22 gemcitabine 08/08/2019 (Abraxane held)  Cycle 23 gemcitabine 08/22/2019 (Abraxane held)  Cycle 24 gemcitabine 09/05/2019 (Abraxane held)  Cycle 25 gemcitabine 09/19/2019 (Abraxane held)  CT abdomen/pelvis 09/30/2019-no change in pancreas mass, slight enlargement of right liver metastases, largest measuring 1 cm, no new lesions  Cycle 26 gemcitabine/Abraxane 10/03/2019-Abraxane resumed at a dose of 100 mg/m  Cycle 27 gemcitabine/Abraxane 10/17/2019  Cycle 28 gemcitabine/Abraxane 10/31/2019  Cycle 29 gemcitabine/Abraxane 11/14/2019  Cycle 30 gemcitabine/Abraxane 11/28/2019  Cycle 31 gemcitabine/Abraxane 12/12/2019  CT abdomen/pelvis 12/25/2019-stable right hepatic lesion, no new liver lesions, stable pancreas tail mass, tiny bilateral lung base nodules, new compared to a chest CT 09/25/2017 and some have progressed compared to a's CT abdomen 08/05/2019  Cycle 32 gemcitabine/Abraxane 01/02/2020 (chemotherapy dose reduced due to neutropenia)  Cycle 33 gemcitabine/Abraxane 01/23/2020  Cycle 34 gemcitabine 02/13/2020 (Abraxane held secondary to neuropathy)  CT abdomen/pelvis 03/03/2020-no significant change in the appearance of tiny bilateral lung nodules at the lower lung zones. Previously characterized subcapsular  metastatic liver lesion not seen on current study. No new focal liver abnormality. No biliary ductal dilatation. Index lesion distal tail of pancreas smaller. Interval development of large volume ascites within the abdomen and pelvis. No discrete peritoneal nodule. Portal vein patent. Esophageal and gastric varices identified similar to prior exam.  Gemcitabine every 2 weeks resumed 03/25/2020  6.Port-A-Cath placement by Dr. 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 Hospitalization 03/03/2020 with weakness, marked abdominal distention   CT abdomen/pelvis 03/03/2020-no significant change in the appearance of tiny bilateral lung nodules at the lower lung zones. Previously characterized subcapsular metastatic liver lesion not seen on current study. No new focal liver abnormality. No biliary ductal dilatation. Index lesion distal tail of pancreas smaller. Interval development of large volume ascites within the abdomen and pelvis. No discrete peritoneal nodule. Portal vein patent. Esophageal and gastric varices identified similar to prior exam.  Brain CT 03/03/2020-negative  Paracentesis 03/03/2020-5 L of fluid removed. Cytology negative.  Paracentesis 03/07/2020-3.4 L of fluid removed.  Reactive mesothelial cells.  10. 1 set of blood cultures from the Port-A-Cath positive for staph epidermidis-treated with course of vancomycin 11. Mild thrombocytopenia-likely secondary to chemotherapy  Disposition: Mr. Sherrow performance status has improved over the past week.  Plan to resume gemcitabine every 2 weeks beginning today.  We reviewed the CBC from today.  Counts adequate to proceed with treatment.  He will return for lab, follow-up, gemcitabine in 2 weeks.  He will contact the office in the interim with any problems.  Patient seen with Dr. Benay Spice.   Ned Card  ANP/GNP-BC   03/25/2020  9:35 AM  This was a shared visit with Ned Card.  Mr. Cipollone has an improved performance status.  The plan is to continue gemcitabine chemotherapy.  Julieanne Manson, MD

## 2020-03-25 NOTE — Patient Instructions (Signed)
[facility] °Discharge Instructions for Patients Receiving Chemotherapy ° °Today you received the following chemotherapy agents Gemzar ° °To help prevent nausea and vomiting after your treatment, we encourage you to take your nausea medication as directed. °  °If you develop nausea and vomiting that is not controlled by your nausea medication, call the clinic.  ° °BELOW ARE SYMPTOMS THAT SHOULD BE REPORTED IMMEDIATELY: °· *FEVER GREATER THAN 100.5 F °· *CHILLS WITH OR WITHOUT FEVER °· NAUSEA AND VOMITING THAT IS NOT CONTROLLED WITH YOUR NAUSEA MEDICATION °· *UNUSUAL SHORTNESS OF BREATH °· *UNUSUAL BRUISING OR BLEEDING °· TENDERNESS IN MOUTH AND THROAT WITH OR WITHOUT PRESENCE OF ULCERS °· *URINARY PROBLEMS °· *BOWEL PROBLEMS °· UNUSUAL RASH °Items with * indicate a potential emergency and should be followed up as soon as possible. ° °Feel free to call the clinic should you have any questions or concerns. The clinic phone number is [phone]. ° °Please show the CHEMO ALERT CARD at check-in to the Emergency Department and triage nurse. ° ° °

## 2020-03-25 NOTE — Patient Instructions (Signed)

## 2020-03-26 ENCOUNTER — Telehealth: Payer: Self-pay | Admitting: Oncology

## 2020-03-26 NOTE — Telephone Encounter (Signed)
Scheduled appointments per 10/28 los. Called patient, no answer. Left message for patient who appointment date and time.

## 2020-03-29 ENCOUNTER — Telehealth: Payer: Self-pay | Admitting: *Deleted

## 2020-03-29 DIAGNOSIS — C252 Malignant neoplasm of tail of pancreas: Secondary | ICD-10-CM

## 2020-03-29 NOTE — Telephone Encounter (Signed)
Left VM requesting paracentesis--abdomen swollen and uncomfortable. OK per Dr. Benay Spice. Provided patient paracentesis at [facility] tomorrow at 0845/0900.

## 2020-03-30 ENCOUNTER — Telehealth: Payer: Self-pay | Admitting: *Deleted

## 2020-03-30 ENCOUNTER — Ambulatory Visit (HOSPITAL_COMMUNITY)
Admission: RE | Admit: 2020-03-30 | Discharge: 2020-03-30 | Disposition: A | Discharge status: Home / Self Care | Payer: Medicare Other | Source: Ambulatory Visit | Attending: Oncology | Admitting: Oncology

## 2020-03-30 ENCOUNTER — Other Ambulatory Visit: Payer: Self-pay

## 2020-03-30 DIAGNOSIS — R180 Malignant ascites: Secondary | ICD-10-CM | POA: Diagnosis not present

## 2020-03-30 DIAGNOSIS — C252 Malignant neoplasm of tail of pancreas: Secondary | ICD-10-CM | POA: Diagnosis present

## 2020-03-30 HISTORY — PX: IR PARACENTESIS: IMG2679

## 2020-03-30 MED ORDER — LIDOCAINE HCL 1 % IJ SOLN
INTRAMUSCULAR | Status: AC
  Filled 2020-03-30: qty 20

## 2020-03-30 MED ORDER — LIDOCAINE HCL (PF) 1 % IJ SOLN
INTRAMUSCULAR | Status: DC | PRN
  Administered 2020-03-30: 10 mL

## 2020-03-30 NOTE — Procedures (Signed)
PROCEDURE SUMMARY:  Successful US guided paracentesis from right abdomen.  Yielded 4L of hazy yellow fluid.  No immediate complications.  Pt tolerated well.   EBL < 2 mL  Theresa Duty, NP 03/30/2020 10:17 AM

## 2020-03-30 NOTE — Telephone Encounter (Addendum)
Left VM asking for orders to continue home health care visits. This RN called back and left message to confirm was Dr. Benay Spice the ordering physician for home health and what care/assessment does he receive at visit and how often? Supervisor called back requesting order for home health RN to evaluate/assess this week for needs. Original referral was for PT per hospitalist, who suggested home health. Provided verbal order to see and evaluate today.

## 2020-04-04 ENCOUNTER — Emergency Department (HOSPITAL_COMMUNITY): Payer: Medicare Other

## 2020-04-04 ENCOUNTER — Other Ambulatory Visit: Payer: Self-pay

## 2020-04-04 ENCOUNTER — Inpatient Hospital Stay (HOSPITAL_COMMUNITY)
Admission: EM | Admit: 2020-04-04 | Discharge: 2020-04-09 | DRG: 682 | Disposition: A | Discharge status: Hospice | Payer: Medicare Other | Attending: Internal Medicine | Admitting: Internal Medicine

## 2020-04-04 ENCOUNTER — Encounter (HOSPITAL_COMMUNITY): Payer: Self-pay

## 2020-04-04 DIAGNOSIS — E7220 Disorder of urea cycle metabolism, unspecified: Secondary | ICD-10-CM | POA: Diagnosis present

## 2020-04-04 DIAGNOSIS — E861 Hypovolemia: Secondary | ICD-10-CM | POA: Diagnosis present

## 2020-04-04 DIAGNOSIS — R1114 Bilious vomiting: Secondary | ICD-10-CM

## 2020-04-04 DIAGNOSIS — G9341 Metabolic encephalopathy: Secondary | ICD-10-CM | POA: Diagnosis present

## 2020-04-04 DIAGNOSIS — Z808 Family history of malignant neoplasm of other organs or systems: Secondary | ICD-10-CM

## 2020-04-04 DIAGNOSIS — Z8673 Personal history of transient ischemic attack (TIA), and cerebral infarction without residual deficits: Secondary | ICD-10-CM | POA: Diagnosis not present

## 2020-04-04 DIAGNOSIS — I2510 Atherosclerotic heart disease of native coronary artery without angina pectoris: Secondary | ICD-10-CM | POA: Diagnosis present

## 2020-04-04 DIAGNOSIS — R531 Weakness: Secondary | ICD-10-CM | POA: Diagnosis not present

## 2020-04-04 DIAGNOSIS — R7989 Other specified abnormal findings of blood chemistry: Secondary | ICD-10-CM | POA: Diagnosis not present

## 2020-04-04 DIAGNOSIS — Z85048 Personal history of other malignant neoplasm of rectum, rectosigmoid junction, and anus: Secondary | ICD-10-CM | POA: Diagnosis not present

## 2020-04-04 DIAGNOSIS — D689 Coagulation defect, unspecified: Secondary | ICD-10-CM | POA: Diagnosis present

## 2020-04-04 DIAGNOSIS — E872 Acidosis, unspecified: Secondary | ICD-10-CM

## 2020-04-04 DIAGNOSIS — E860 Dehydration: Secondary | ICD-10-CM | POA: Diagnosis not present

## 2020-04-04 DIAGNOSIS — C787 Secondary malignant neoplasm of liver and intrahepatic bile duct: Secondary | ICD-10-CM | POA: Diagnosis present

## 2020-04-04 DIAGNOSIS — Z6833 Body mass index (BMI) 33.0-33.9, adult: Secondary | ICD-10-CM | POA: Diagnosis not present

## 2020-04-04 DIAGNOSIS — K7682 Hepatic encephalopathy: Secondary | ICD-10-CM

## 2020-04-04 DIAGNOSIS — Z20822 Contact with and (suspected) exposure to covid-19: Secondary | ICD-10-CM | POA: Diagnosis present

## 2020-04-04 DIAGNOSIS — Z823 Family history of stroke: Secondary | ICD-10-CM

## 2020-04-04 DIAGNOSIS — Z7189 Other specified counseling: Secondary | ICD-10-CM | POA: Diagnosis not present

## 2020-04-04 DIAGNOSIS — Z933 Colostomy status: Secondary | ICD-10-CM

## 2020-04-04 DIAGNOSIS — C187 Malignant neoplasm of sigmoid colon: Secondary | ICD-10-CM | POA: Diagnosis present

## 2020-04-04 DIAGNOSIS — I10 Essential (primary) hypertension: Secondary | ICD-10-CM | POA: Diagnosis present

## 2020-04-04 DIAGNOSIS — R0602 Shortness of breath: Secondary | ICD-10-CM

## 2020-04-04 DIAGNOSIS — Z9049 Acquired absence of other specified parts of digestive tract: Secondary | ICD-10-CM | POA: Diagnosis not present

## 2020-04-04 DIAGNOSIS — E669 Obesity, unspecified: Secondary | ICD-10-CM | POA: Diagnosis present

## 2020-04-04 DIAGNOSIS — C252 Malignant neoplasm of tail of pancreas: Secondary | ICD-10-CM | POA: Diagnosis present

## 2020-04-04 DIAGNOSIS — Z515 Encounter for palliative care: Secondary | ICD-10-CM | POA: Diagnosis not present

## 2020-04-04 DIAGNOSIS — R32 Unspecified urinary incontinence: Secondary | ICD-10-CM | POA: Diagnosis present

## 2020-04-04 DIAGNOSIS — E871 Hypo-osmolality and hyponatremia: Secondary | ICD-10-CM | POA: Diagnosis present

## 2020-04-04 DIAGNOSIS — N179 Acute kidney failure, unspecified: Secondary | ICD-10-CM | POA: Diagnosis present

## 2020-04-04 DIAGNOSIS — R188 Other ascites: Secondary | ICD-10-CM | POA: Diagnosis present

## 2020-04-04 DIAGNOSIS — K7290 Hepatic failure, unspecified without coma: Secondary | ICD-10-CM | POA: Diagnosis present

## 2020-04-04 DIAGNOSIS — Z7982 Long term (current) use of aspirin: Secondary | ICD-10-CM

## 2020-04-04 DIAGNOSIS — K766 Portal hypertension: Secondary | ICD-10-CM | POA: Diagnosis present

## 2020-04-04 DIAGNOSIS — Z8249 Family history of ischemic heart disease and other diseases of the circulatory system: Secondary | ICD-10-CM

## 2020-04-04 DIAGNOSIS — I700 Atherosclerosis of aorta: Secondary | ICD-10-CM | POA: Diagnosis present

## 2020-04-04 DIAGNOSIS — Z66 Do not resuscitate: Secondary | ICD-10-CM | POA: Diagnosis present

## 2020-04-04 DIAGNOSIS — E785 Hyperlipidemia, unspecified: Secondary | ICD-10-CM | POA: Diagnosis present

## 2020-04-04 DIAGNOSIS — Z86010 Personal history of colonic polyps: Secondary | ICD-10-CM

## 2020-04-04 DIAGNOSIS — Z79899 Other long term (current) drug therapy: Secondary | ICD-10-CM

## 2020-04-04 DIAGNOSIS — D630 Anemia in neoplastic disease: Secondary | ICD-10-CM | POA: Diagnosis present

## 2020-04-04 DIAGNOSIS — C259 Malignant neoplasm of pancreas, unspecified: Secondary | ICD-10-CM | POA: Diagnosis not present

## 2020-04-04 DIAGNOSIS — E7800 Pure hypercholesterolemia, unspecified: Secondary | ICD-10-CM | POA: Diagnosis present

## 2020-04-04 DIAGNOSIS — Z7902 Long term (current) use of antithrombotics/antiplatelets: Secondary | ICD-10-CM

## 2020-04-04 DIAGNOSIS — Z833 Family history of diabetes mellitus: Secondary | ICD-10-CM

## 2020-04-04 DIAGNOSIS — G629 Polyneuropathy, unspecified: Secondary | ICD-10-CM | POA: Diagnosis present

## 2020-04-04 DIAGNOSIS — Z9221 Personal history of antineoplastic chemotherapy: Secondary | ICD-10-CM

## 2020-04-04 DIAGNOSIS — R42 Dizziness and giddiness: Secondary | ICD-10-CM

## 2020-04-04 DIAGNOSIS — D72829 Elevated white blood cell count, unspecified: Secondary | ICD-10-CM | POA: Diagnosis present

## 2020-04-04 LAB — COMPREHENSIVE METABOLIC PANEL
ALT: 23 U/L (ref 0–44)
AST: 58 U/L — ABNORMAL HIGH (ref 15–41)
Albumin: 2.5 g/dL — ABNORMAL LOW (ref 3.5–5.0)
Alkaline Phosphatase: 133 U/L — ABNORMAL HIGH (ref 38–126)
Anion gap: 14 (ref 5–15)
BUN: 58 mg/dL — ABNORMAL HIGH (ref 8–23)
CO2: 21 mmol/L — ABNORMAL LOW (ref 22–32)
Calcium: 7.8 mg/dL — ABNORMAL LOW (ref 8.9–10.3)
Chloride: 96 mmol/L — ABNORMAL LOW (ref 98–111)
Creatinine, Ser: 2.3 mg/dL — ABNORMAL HIGH (ref 0.61–1.24)
GFR, Estimated: 30 mL/min — ABNORMAL LOW (ref >60)
Glucose, Bld: 122 mg/dL — ABNORMAL HIGH (ref 70–99)
Potassium: 4.9 mmol/L (ref 3.5–5.1)
Sodium: 131 mmol/L — ABNORMAL LOW (ref 135–145)
Total Bilirubin: 2.9 mg/dL — ABNORMAL HIGH (ref 0.3–1.2)
Total Protein: 6.2 g/dL — ABNORMAL LOW (ref 6.5–8.1)

## 2020-04-04 LAB — CBC WITH DIFFERENTIAL/PLATELET
Abs Immature Granulocytes: 0.34 10*3/uL — ABNORMAL HIGH (ref 0.00–0.07)
Basophils Absolute: 0.1 10*3/uL (ref 0.0–0.1)
Basophils Relative: 1 %
Eosinophils Absolute: 0 10*3/uL (ref 0.0–0.5)
Eosinophils Relative: 0 %
HCT: 39.3 % (ref 39.0–52.0)
Hemoglobin: 13.2 g/dL (ref 13.0–17.0)
Immature Granulocytes: 3 %
Lymphocytes Relative: 8 %
Lymphs Abs: 0.9 10*3/uL (ref 0.7–4.0)
MCH: 30.8 pg (ref 26.0–34.0)
MCHC: 33.6 g/dL (ref 30.0–36.0)
MCV: 91.8 fL (ref 80.0–100.0)
Monocytes Absolute: 1.7 10*3/uL — ABNORMAL HIGH (ref 0.1–1.0)
Monocytes Relative: 16 %
Neutro Abs: 8.1 10*3/uL — ABNORMAL HIGH (ref 1.7–7.7)
Neutrophils Relative %: 72 %
Platelets: 130 10*3/uL — ABNORMAL LOW (ref 150–400)
RBC: 4.28 MIL/uL (ref 4.22–5.81)
RDW: 18.3 % — ABNORMAL HIGH (ref 11.5–15.5)
WBC: 11.1 10*3/uL — ABNORMAL HIGH (ref 4.0–10.5)
nRBC: 0.4 % — ABNORMAL HIGH (ref 0.0–0.2)

## 2020-04-04 LAB — RESP PANEL BY RT PCR (RSV, FLU A&B, COVID)
Influenza A by PCR: NEGATIVE
Influenza B by PCR: NEGATIVE
Respiratory Syncytial Virus by PCR: NEGATIVE
SARS Coronavirus 2 by RT PCR: NEGATIVE

## 2020-04-04 LAB — ETHANOL: Alcohol, Ethyl (B): <10 mg/dL (ref <10)

## 2020-04-04 LAB — LIPASE, BLOOD: Lipase: 57 U/L — ABNORMAL HIGH (ref 11–51)

## 2020-04-04 MED ORDER — AMLODIPINE BESYLATE 10 MG PO TABS: 10.0000 mg | ORAL_TABLET | Freq: Every day | ORAL | Status: DC

## 2020-04-04 MED ORDER — ALBUMIN HUMAN 25 % IV SOLN
25.0000 g | Freq: Four times a day (QID) | INTRAVENOUS | Status: AC
  Administered 2020-04-04 – 2020-04-06 (×5): 25 g via INTRAVENOUS
  Filled 2020-04-04 (×9): qty 100

## 2020-04-04 MED ORDER — ONDANSETRON HCL 4 MG/2ML IJ SOLN
4.0000 mg | Freq: Once | INTRAMUSCULAR | Status: AC
  Administered 2020-04-04: 4 mg via INTRAVENOUS
  Filled 2020-04-04: qty 2

## 2020-04-04 MED ORDER — DOCUSATE SODIUM 100 MG PO CAPS
100.0000 mg | ORAL_CAPSULE | Freq: Two times a day (BID) | ORAL | Status: DC
  Filled 2020-04-04 (×2): qty 1

## 2020-04-04 MED ORDER — METOCLOPRAMIDE HCL 5 MG/ML IJ SOLN
10.0000 mg | Freq: Once | INTRAMUSCULAR | Status: AC
  Administered 2020-04-04: 10 mg via INTRAVENOUS
  Filled 2020-04-04: qty 2

## 2020-04-04 MED ORDER — PROMETHAZINE HCL 25 MG/ML IJ SOLN
6.2500 mg | Freq: Four times a day (QID) | INTRAMUSCULAR | Status: DC | PRN
  Administered 2020-04-04 – 2020-04-05 (×2): 6.25 mg via INTRAVENOUS
  Filled 2020-04-04 (×2): qty 1

## 2020-04-04 MED ORDER — GABAPENTIN 100 MG PO CAPS
200.0000 mg | ORAL_CAPSULE | Freq: Every day | ORAL | Status: DC
  Filled 2020-04-04 (×2): qty 2

## 2020-04-04 MED ORDER — ACETAMINOPHEN 650 MG RE SUPP: 650.0000 mg | Freq: Four times a day (QID) | RECTAL | Status: DC | PRN

## 2020-04-04 MED ORDER — SODIUM CHLORIDE 0.9% FLUSH
3.0000 mL | Freq: Two times a day (BID) | INTRAVENOUS | Status: DC
  Administered 2020-04-04 – 2020-04-05 (×2): 3 mL via INTRAVENOUS

## 2020-04-04 MED ORDER — ONDANSETRON HCL 4 MG PO TABS: 4.0000 mg | ORAL_TABLET | Freq: Four times a day (QID) | ORAL | Status: DC | PRN

## 2020-04-04 MED ORDER — ONDANSETRON HCL 4 MG/2ML IJ SOLN
4.0000 mg | Freq: Four times a day (QID) | INTRAMUSCULAR | Status: DC | PRN
  Administered 2020-04-04: 4 mg via INTRAVENOUS
  Filled 2020-04-04: qty 2

## 2020-04-04 MED ORDER — SODIUM CHLORIDE 0.9 % IV BOLUS
1000.0000 mL | Freq: Once | INTRAVENOUS | Status: AC
  Administered 2020-04-04: 1000 mL via INTRAVENOUS

## 2020-04-04 MED ORDER — ACETAMINOPHEN 325 MG PO TABS: 650.0000 mg | ORAL_TABLET | Freq: Four times a day (QID) | ORAL | Status: DC | PRN

## 2020-04-04 MED ORDER — SODIUM CHLORIDE 0.9 % IV SOLN: INTRAVENOUS | Status: DC

## 2020-04-04 MED ORDER — ATENOLOL 25 MG PO TABS
25.0000 mg | ORAL_TABLET | Freq: Every day | ORAL | Status: DC
  Filled 2020-04-04: qty 1

## 2020-04-04 NOTE — Assessment & Plan Note (Signed)
-  No prior renal dysfunction noted.  His creatinine became elevated October 2021 and has not recovered.  This may be due to deteriorating renal function in setting of his liver mets from malignancy vs hypoperfusion from severe ascites  - trial of albumin and low rate IVF given large ascites; if SOB or worsening distension may need to d/c IVF -No hydronephrosis or urinary tract calculi seen on CT -Currently has condom catheter in place.  If does not void much overnight, may need indwelling catheter for ruling out outlet obstruction; suspect ascites will confound bladder scans

## 2020-04-04 NOTE — Hospital Course (Signed)
Alex [name] is a 69 yo CM with PMH Stage II rectosigmoid adenocarcinoma (now s/p partial colectomy with end colostomy 09/20/17) with subsequent development of stage IV pancreatic cancer (mets to liver, also has splenic lesion and B/L lung base nodules), development of ascites (requires serial taps, but cytology negative for malignancy so far), peripheral neuropathy 2/2 abraxane, CVA, HTN, CAD who presents to the ER with ongoing nausea and vomiting at home as well as some reports of dizziness to ER staff. When asked, he states only approximately 3 episodes of vomiting at home prior to admission.  He also endorses worsening abdominal distention.  His last paracentesis was on 03/30/2020 with 4 L removed. He endorses urinating less than usual with darkening of color but cannot elaborate further. His ostomy bag output has remained at baseline with blend of solid and liquid stool.  ER work-up notable for: CT head unremarkable CT abdomen/pelvis: Large abdominal ascites, small decrease in pancreatic tail lesion.  Stable bilateral pulmonary nodules Labs: Na 131, K 4.9, Cl 96, Co2 21, Glucose 122, BUN 58, Creat 2.3, Alb 2.5, AST 58, ALT 23, AP 133, TB 2.9 WBC 11.1, Hgb 13.2, Hct 39.3, PLTC 130 Lipase 57  He has no known history of CKD prior to October 2021 with normal renal function seen in the past.  Due to his decreased urine output, increased creatinine, increased abdominal distention he is admitted for further treatment of AKI and obtaining paracentesis.

## 2020-04-04 NOTE — H&P (Signed)
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 When asked, he states only approximately 3 episodes of vomiting at home prior to admission.  He also endorses worsening abdominal distention.  His last paracentesis was on 03/30/2020 with 4 L removed. He endorses urinating less than usual with darkening of color but cannot elaborate further. His ostomy bag output has remained at baseline with blend of solid and liquid stool.  ER work-up notable for: CT head unremarkable CT abdomen/pelvis: Large abdominal ascites, small decrease in pancreatic tail lesion.  Stable bilateral pulmonary nodules Labs: Na 131, K 4.9, Cl 96, Co2 21, Glucose 122, BUN 58, Creat 2.3, Alb 2.5, AST 58, ALT 23, AP 133, TB 2.9 WBC 11.1, Hgb 13.2, Hct 39.3, PLTC 130 Lipase 57  He has no known history of CKD prior to October 2021 with normal renal function seen in the past.  Due to his decreased urine output, increased creatinine, increased abdominal distention he is admitted for further treatment of AKI and obtaining paracentesis.   I have personally briefly reviewed patient's old medical records in [facility] and discussed patient with the ER provider when appropriate/indicated.  Assessment/Plan: * AKI (acute kidney injury) ([place]) -No prior renal dysfunction noted.  His  creatinine became elevated October 2021 and has not recovered.  This may be due to deteriorating renal function in setting of his liver mets from malignancy vs hypoperfusion from severe ascites  - trial of albumin and low rate IVF given large ascites; if SOB or worsening distension may need to d/c IVF -No hydronephrosis or urinary tract calculi seen on CT -Currently has condom catheter in place.  If does not void much overnight, may need indwelling catheter for ruling out outlet obstruction; suspect ascites will confound bladder scans  Ascites -Cytology specimens from 03/03/2020 and 03/07/2020 were negative for malignancy, however given his underlying stage IV pancreatic cancer, suspicion remains high -Paracentesis ordered for the morning, follow-up cytology specimens again -Abdomen is soft and nontender, no concern for SBP at this time but will check cell count and culture regardless - hold hctz and spironolactone for now in setting of AKI  Elevated LFTs -CT without contrast, however no calcified stones appreciated nor ductal dilation -Elevation likely in setting of underlying malignancy with liver metastasis -Follow-up CMP in a.m.  Stage IV adenocarcinoma of pancreas ([place]) - diagnosed 05/27/18; follows with oncology; currently on gemcitabine every 2 weeks (resumed 03/25/20) - concerned about patient's declining quality of life, recurrent hospitalizations, and recurrent ascites (though cytology specimens negative x 2 recently) - palliative care consulted for at least code status discussions but prognosis seems guarded    Code Status: Full DVT Prophylaxis: SCD Anticipated disposition is to: home  History: Past Medical History:  Diagnosis Date  . Abdominal distension 09/14/2017  . 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  Anemia 08/26/2017  . Aortic atherosclerosis ([place]) 08/27/2017  . Atherosclerosis of arteries   . Carotid artery occlusion    . Colon obstruction ([place])   . Colon polyps   . Community acquired pneumonia of right lower lobe of lung 08/04/2017  . Dehydration   . Empyema ([place]) 08/27/2017  . Empyema of right pleural space ([place]) 08/30/2017  . High cholesterol   . History of stroke 08/04/2017  . Hypertension   . Hypokalemia 09/14/2017  . Hyponatremia 08/26/2017  . Large bowel obstruction ([place]) 09/13/2017  . Lobar pneumonia ([place]) 08/27/2017  . Malignant tumor of sigmoid colon ([place])   . Nausea & vomiting 09/14/2017  . Nausea and vomiting 09/14/2017  . Pleural effusion 08/25/2017  . Pleural effusion, right 08/27/2017  . Pneumonia   . Pulmonary nodule, right 08/27/2017  . Stroke [facility]) 2010   denies residual on 09/18/2017    Past Surgical History:  Procedure Laterality Date  . ANKLE FRACTURE SURGERY Right 1984  . DECORTICATION Right 08/30/2017   Procedure: DECORTICATION of right lower lung lobe;  Surgeon: Prescott Gum, Collier Salina, MD;  Location: [place];  Service: Thoracic;  Laterality: Right;  . FLEXIBLE SIGMOIDOSCOPY N/A 09/18/2017   Procedure: FLEXIBLE SIGMOIDOSCOPY;  Surgeon: Jerene Bears, MD;  [facility] ENDOSCOPY;  Service: Gastroenterology;  Laterality: N/A;  . IR PARACENTESIS  03/30/2020  . PARTIAL COLECTOMY N/A 09/20/2017   Procedure: OPEN PARTIAL COLECTOMY WITH COLOSTOMY;  Surgeon: Clovis Riley, MD;  Location: [place];  Service: General;  Laterality: N/A;  . PORTACATH PLACEMENT Right 09/20/2018   Procedure: INSERTION PORT-A-CATH WITH ULTRASOUND;  Surgeon: Clovis Riley, MD;  Location: [place];  Service: General;  Laterality: Right;  Marland Kitchen VIDEO ASSISTED THORACOSCOPY (VATS)/EMPYEMA Right 08/30/2017   Procedure: VIDEO ASSISTED THORACOSCOPY (VATS)/EMPYEMA   ;  Surgeon: Ivin Poot, MD;  Location: [place];  Service: Thoracic;  Laterality: Right;     reports that he has never smoked. He has never used smokeless tobacco. He reports previous alcohol use. He reports current drug use. Drug: Marijuana.  No Known  Allergies  Family History  Problem Relation Age of Onset  . Heart disease Other   . Hypertension Father   . Stroke Father   . Heart disease Father   . Diabetes Sister   . Diabetes Brother   . Head & neck cancer Brother    Home Medications: Prior to Admission medications   Medication Sig Start Date End Date Taking? Authorizing Provider  amLODipine (NORVASC) 10 MG tablet Take 10 mg by mouth daily.   Yes [provider]  aspirin EC 81 MG tablet Take 81 mg by mouth daily. Swallow whole.   Yes [provider]  atenolol (TENORMIN) 25 MG tablet Take 25 mg by mouth daily.   Yes [provider]  clopidogrel (PLAVIX) 75 MG tablet Take 75 mg by mouth daily.   Yes [provider]  furosemide (LASIX) 40 MG tablet Take 1 tablet (40 mg total) by mouth daily. 03/10/20 05/09/20 Yes Antonieta Pert, MD  gabapentin (NEURONTIN) 100 MG capsule Take 200 mg by mouth at bedtime.   Yes [provider]  hydrochlorothiazide (HYDRODIURIL) 25 MG tablet Take 25 mg by mouth daily.   Yes [provider]  lidocaine-prilocaine (EMLA) cream Apply 1 application topically as needed. Apply to portacath  1 1/2 -  2 hours prior to procedures as needed. Patient taking differently: Apply 1 application topically as needed (prior to port access (apply 1 1/2 -  2 hours prior to procedures).  10/31/19  Yes Owens Shark, NP  simvastatin (ZOCOR) 20 MG tablet Take 20 mg by mouth at bedtime.    Yes [provider]  spironolactone (ALDACTONE) 50 MG tablet Take 1 tablet (50 mg total) by mouth daily. 03/09/20 05/08/20 Yes Antonieta Pert, MD  acetaminophen (TYLENOL) 500 MG tablet Take 2 tablets (1,000 mg total) by mouth every 6 (six) hours as needed. Patient taking differently: Take 500 mg by mouth every 6 (six) hours as needed for moderate pain.  09/04/17   Barrett, Erin R, PA-C  docusate sodium (COLACE) 100 MG capsule Take 1 capsule (100 mg total) by mouth 2 (two) times daily. 03/09/20    Antonieta Pert, MD  ondansetron (ZOFRAN) 4 MG tablet Take 1 tablet (4 mg total) by mouth every 6 (six) hours as needed for nausea. 03/09/20   Antonieta Pert, MD  prochlorperazine (COMPAZINE) 10 MG tablet Take 1 tablet (10 mg total) by mouth every 6 (six) hours as needed for nausea or vomiting. 01/16/20   Ladell Pier, MD    Review of Systems:  Pertinent items noted in HPI and remainder of comprehensive ROS otherwise negative.  Physical Exam: Vitals:   04/04/20 1839 04/04/20 1930 04/04/20 2000 04/04/20 2100  BP: 127/76 133/80 (!) 123/95 (!) 120/91  Pulse: 89 86 99 (!) 108  Resp: 19 18 18 20   Temp:      TempSrc:      SpO2: 96% 97% 96% 96%  Weight:      Height:       General appearance: slowed mentation, pleasant, no distress, slightly unkempt Head: Normocephalic, without obvious abnormality, atraumatic Eyes: Scleral icterus appreciated Lungs: clear to auscultation bilaterally Heart: regular rate and rhythm and S1, S2 normal Abdomen: Severely distended, soft, nontender, bowel sounds present.  Ostomy bag in place with solid clumped stool Extremities: Trace lower extremity edema Skin: Senile purpura appreciated in upper extremities with scattered bruising Neurologic: Grossly normal  Labs on Admission:  I have personally reviewed following labs and imaging studies Results for orders placed or performed during the hospital encounter of 04/04/20 (from the past 24 hour(s))  Comprehensive metabolic panel     Status: Abnormal   Collection Time: 04/04/20  3:00 PM  Result Value Ref Range   Sodium 131 (L) 135 - 145 mmol/L   Potassium 4.9 3.5 - 5.1 mmol/L   Chloride 96 (L) 98 - 111 mmol/L   CO2 21 (L) 22 - 32 mmol/L   Glucose, Bld 122 (H) 70 - 99 mg/dL   BUN 58 (H) 8 - 23 mg/dL   Creatinine, Ser 2.30 (H) 0.61 - 1.24 mg/dL   Calcium 7.8 (L) 8.9 - 10.3 mg/dL   Total Protein 6.2 (L) 6.5 - 8.1 g/dL   Albumin 2.5 (L) 3.5 - 5.0 g/dL   AST 58 (H) 15 - 41 U/L   ALT 23 0 - 44 U/L   Alkaline  Phosphatase 133 (H) 38 - 126 U/L   Total Bilirubin 2.9 (H) 0.3 - 1.2 mg/dL   GFR, Estimated 30 (L) >60 mL/min   Anion gap 14 5 - 15  CBC with Differential     Status: Abnormal   Collection Time: 04/04/20  3:00 PM  Result Value Ref Range   WBC 11.1 (H) 4.0 - 10.5 K/uL   RBC 4.28 4.22 - 5.81 MIL/uL   Hemoglobin 13.2 13.0 - 17.0 g/dL   HCT 39.3 39 - 52 %   MCV 91.8 80.0 -  100.0 fL   MCH 30.8 26.0 - 34.0 pg   MCHC 33.6 30.0 - 36.0 g/dL   RDW 18.3 (H) 11.5 - 15.5 %   Platelets 130 (L) 150 - 400 K/uL   nRBC 0.4 (H) 0.0 - 0.2 %   Neutrophils Relative % 72 %   Neutro Abs 8.1 (H) 1.7 - 7.7 K/uL   Lymphocytes Relative 8 %   Lymphs Abs 0.9 0.7 - 4.0 K/uL   Monocytes Relative 16 %   Monocytes Absolute 1.7 (H) 0.1 - 1.0 K/uL   Eosinophils Relative 0 %   Eosinophils Absolute 0.0 0.0 - 0.5 K/uL   Basophils Relative 1 %   Basophils Absolute 0.1 0.0 - 0.1 K/uL   Immature Granulocytes 3 %   Abs Immature Granulocytes 0.34 (H) 0.00 - 0.07 K/uL  Ethanol     Status: None   Collection Time: 04/04/20  3:00 PM  Result Value Ref Range   Alcohol, Ethyl (B) <10 <10 mg/dL  Lipase, blood     Status: Abnormal   Collection Time: 04/04/20  3:00 PM  Result Value Ref Range   Lipase 57 (H) 11 - 51 U/L  Resp Panel by RT PCR (RSV, Flu A&B, Covid) - Nasopharyngeal Swab     Status: None   Collection Time: 04/04/20  8:13 PM   Specimen: Nasopharyngeal Swab  Result Value Ref Range   SARS Coronavirus 2 by RT PCR NEGATIVE NEGATIVE   Influenza A by PCR NEGATIVE NEGATIVE   Influenza B by PCR NEGATIVE NEGATIVE   Respiratory Syncytial Virus by PCR NEGATIVE NEGATIVE     Radiological Exams on Admission: CT ABDOMEN PELVIS WO CONTRAST  Result Date: 04/04/2020 CLINICAL DATA:  Vomiting history colon and pancreatic cancer EXAM: CT ABDOMEN AND PELVIS WITHOUT CONTRAST TECHNIQUE: Multidetector CT imaging of the abdomen and pelvis was performed following the standard protocol without IV contrast. COMPARISON:  March 03, 2020  FINDINGS: Lower chest: The visualized heart size within normal limits. No pericardial fluid/thickening. No hiatal hernia. Streaky atelectasis seen at both lung bases. There is tiny scattered sub 5 mm pulmonary nodules at both lung bases. Hepatobiliary: Although limited due to the lack of intravenous contrast, normal in appearance without gross focal abnormality. No evidence of calcified gallstones or biliary ductal dilatation. Pancreas: Within the tail of the pancreas there is slight interval decrease in the hypoechoic lesion with peripheral calcifications now measuring 1.3 x 1.1 cm, previously 2.0 x 1.6 cm. Spleen: Normal in size. Although limited due to the lack of intravenous contrast, normal in appearance. Adrenals/Urinary Tract: Both adrenal glands appear normal. The kidneys and collecting system appear normal without evidence of urinary tract calculus or hydronephrosis. Bladder is unremarkable. Stomach/Bowel: The stomach, small bowel, are normal in appearance. Again noted is a left anterior abdominal colostomy. Remainder of the colon is unremarkable. Scattered colonic diverticula are noted. Vascular/Lymphatic: There are no enlarged abdominal or pelvic lymph nodes. Scattered aortic atherosclerosis seen. 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 Reproductive: The prostate is unremarkable. Other: A large amount of abdominopelvic ascites is noted. Small fat containing inguinal hernias are seen. Musculoskeletal: No acute or significant osseous findings. IMPRESSION: 1. Large amount of abdominopelvic ascites. 2. Slight interval decreased size in the pancreatic tail lesion, now measuring 1.3 x 1.1 cm. 3. No definite hepatic lesion identified, however limited due to lack of intravenous contrast. 4. Stable bilateral tiny pulmonary nodules. 5.  Aortic Atherosclerosis (ICD10-I70.0). Electronically Signed   By: Prudencio Pair M.D.   On: 04/04/2020 19:31  CT Head Wo Contrast  Result Date: 04/04/2020 CLINICAL DATA:   Dizziness, vomiting, colon cancer EXAM: CT HEAD WITHOUT CONTRAST TECHNIQUE: Contiguous axial images were obtained from the base of the skull through the vertex without intravenous contrast. COMPARISON:  03/03/2020 FINDINGS: Brain: Chronic encephalomalacia within the left temporal region is stable. No acute infarct or hemorrhage. Lateral ventricles and midline structures are unremarkable. No acute extra-axial fluid collections. No mass effect. Vascular: No hyperdense vessel or unexpected calcification. Skull: Normal. Negative for fracture or focal lesion. Sinuses/Orbits: No acute finding. Other: None. IMPRESSION: 1. Stable exam, no acute process. Electronically Signed   By: Randa Ngo M.D.   On: 04/04/2020 16:11   CT ABDOMEN PELVIS WO CONTRAST  Final Result    CT Head Wo Contrast  Final Result    US Paracentesis    (Results Pending)    Consults called:   None    Dwyane Dee, MD [facility] 04/04/2020, 9:32 PM

## 2020-04-04 NOTE — Assessment & Plan Note (Signed)
-  CT without contrast, however no calcified stones appreciated nor ductal dilation -Elevation likely in setting of underlying malignancy with liver metastasis -Follow-up CMP in a.m.

## 2020-04-04 NOTE — Assessment & Plan Note (Signed)
-   diagnosed 05/27/18; follows with oncology; currently on gemcitabine every 2 weeks (resumed 03/25/20) - concerned about patient's declining quality of life, recurrent hospitalizations, and recurrent ascites (though cytology specimens negative x 2 recently) - palliative care consulted for at least code status discussions but prognosis seems guarded

## 2020-04-04 NOTE — ED Provider Notes (Signed)
[facility] DEPT Provider Note   CSN: [id number] Arrival date & time: 04/04/20  1401     History Chief Complaint  Patient presents with  . Dizziness    Alex [name] is a 69 y.o. male.  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  Patient has no history of incontinence, no dizziness. He notes that he awoke this morning, in his usual state of health, but soon thereafter dizzy, sense of room moving.  No disequilibrium, no falling, no focal weakness, no vision changes.  No additional details about the incontinence are available. Patient notes that he feels somewhat better this afternoon. Last chemotherapy was 2 days ago.  He has been take all medication, including Plavix as directed. History is obtained by the patient and chart review, including documentation from his most recent paracentesis, absence of recent imaging of his head for consideration of metastases.  Past Medical History:  Diagnosis Date  . Abdominal distension 09/14/2017  . Adenocarcinoma of sigmoid colon ([place]) 2019   with involvement of rectosigmoid junction  . Anemia 08/26/2017  . Aortic atherosclerosis ([place]) 08/27/2017  . Atherosclerosis of arteries   . Carotid artery occlusion   . Colon obstruction ([place])   . Colon polyps   . Community acquired pneumonia of right lower lobe of lung 08/04/2017  . Dehydration   . Empyema ([place]) 08/27/2017  . Empyema of right pleural space ([place]) 08/30/2017  . High cholesterol   . History of stroke 08/04/2017  . Hypertension   . Hypokalemia 09/14/2017  . Hyponatremia 08/26/2017  . Large bowel obstruction ([place]) 09/13/2017  . Lobar pneumonia ([place]) 08/27/2017  . Malignant tumor of sigmoid colon ([place])   . Nausea & vomiting 09/14/2017  . Nausea and vomiting 09/14/2017  . Pleural effusion 08/25/2017  . Pleural effusion, right 08/27/2017    . Pneumonia   . Pulmonary nodule, right 08/27/2017  . Stroke [facility]) 2010   denies residual on 09/18/2017    Patient Active Problem List   Diagnosis Date Noted  . AKI (acute kidney injury) ([place])   . Bacteremia   . Ascites 03/03/2020  . Port-A-Cath in place 10/11/2018  . Goals of care, counseling/discussion 10/04/2018  . Cancer of pancreas, tail ([place]) 10/04/2018  . Genetic testing 08/26/2018  . Malignant tumor of sigmoid colon ([place])   . Colon obstruction ([place])   . Abdominal distension 09/14/2017  . Nausea & vomiting 09/14/2017  . Hypokalemia 09/14/2017  . HLD (hyperlipidemia) 09/14/2017  . Nausea and vomiting 09/14/2017  . Dehydration   . Empyema of right pleural space ([place]) 08/30/2017  . Lobar pneumonia ([place]) 08/27/2017  . Empyema ([place]) 08/27/2017  . Pleural effusion, right 08/27/2017  . Pulmonary nodule, right 08/27/2017  . Benign essential HTN 08/27/2017  . Aortic atherosclerosis ([place]) 08/27/2017  . Anemia 08/26/2017  . Pleural effusion 08/25/2017  . Community acquired pneumonia of right lower lobe of lung 08/04/2017  . History of stroke 08/04/2017  . Occlusion and stenosis of carotid artery without mention of cerebral infarction 09/07/2011    Past Surgical History:  Procedure Laterality Date  . ANKLE FRACTURE SURGERY Right 1984  . DECORTICATION Right 08/30/2017   Procedure: DECORTICATION of right lower lung lobe;  Surgeon: Prescott Gum, Collier Salina, MD;  Location: [facility];  Service: Thoracic;  Laterality: Right;  . FLEXIBLE SIGMOIDOSCOPY N/A 09/18/2017   Procedure: FLEXIBLE SIGMOIDOSCOPY;  Surgeon: Jerene Bears,  MD;  Location: [facility];  Service: Gastroenterology;  Laterality: N/A;  . IR PARACENTESIS  03/30/2020  . PARTIAL COLECTOMY N/A 09/20/2017   Procedure: OPEN PARTIAL COLECTOMY WITH COLOSTOMY;  Surgeon: Clovis Riley, MD;  Location: [facility];  Service: General;  Laterality: N/A;  . PORTACATH PLACEMENT Right 09/20/2018   Procedure: INSERTION PORT-A-CATH WITH ULTRASOUND;   Surgeon: Clovis Riley, MD;  Location: [facility];  Service: General;  Laterality: Right;  Marland Kitchen VIDEO ASSISTED THORACOSCOPY (VATS)/EMPYEMA Right 08/30/2017   Procedure: VIDEO ASSISTED THORACOSCOPY (VATS)/EMPYEMA   ;  Surgeon: Ivin Poot, MD;  Location[facility] OR;  Service: Thoracic;  Laterality: Right;       Family History  Problem Relation Age of Onset  . Heart disease Other   . Hypertension Father   . Stroke Father   . Heart disease Father   . Diabetes Sister   . Diabetes Brother   . Head & neck cancer Brother     Social History   Tobacco Use  . Smoking status: Never Smoker  . Smokeless tobacco: Never Used  Vaping Use  . Vaping Use: Never used  Substance Use Topics  . Alcohol use: Not Currently    Alcohol/week: 0.0 standard drinks    Comment: 09/18/2017 "nothing since the 1990s"  . Drug use: Yes    Types: Marijuana    Comment: 09/18/2017 "nothing since the 1980s"    Home Medications Prior to Admission medications   Medication Sig Start Date End Date Taking? Authorizing Provider  amLODipine (NORVASC) 10 MG tablet Take 10 mg by mouth daily.   Yes [provider]  aspirin EC 81 MG tablet Take 81 mg by mouth daily. Swallow whole.   Yes [provider]  atenolol (TENORMIN) 25 MG tablet Take 25 mg by mouth daily.   Yes [provider]  clopidogrel (PLAVIX) 75 MG tablet Take 75 mg by mouth daily.   Yes [provider]  furosemide (LASIX) 40 MG tablet Take 1 tablet (40 mg total) by mouth daily. 03/10/20 05/09/20 Yes Antonieta Pert, MD  gabapentin (NEURONTIN) 100 MG capsule Take 200 mg by mouth at bedtime.   Yes [provider]  hydrochlorothiazide (HYDRODIURIL) 25 MG tablet Take 25 mg by mouth daily.   Yes [provider]  lidocaine-prilocaine (EMLA) cream Apply 1 application topically as needed. Apply to portacath  1 1/2 -  2 hours prior to procedures as needed. Patient taking differently: Apply 1 application  topically as needed (prior to port access (apply 1 1/2 -2 hours prior to procedures).  10/31/19  Yes Owens Shark, NP  simvastatin (ZOCOR) 20 MG tablet Take 20 mg by mouth at bedtime.    Yes [provider]  spironolactone (ALDACTONE) 50 MG tablet Take 1 tablet (50 mg total) by mouth daily. 03/09/20 05/08/20 Yes Antonieta Pert, MD  acetaminophen (TYLENOL) 500 MG tablet Take 2 tablets (1,000 mg total) by mouth every 6 (six) hours as needed. Patient taking differently: Take 500 mg by mouth every 6 (six) hours as needed for moderate pain.  09/04/17   Barrett, Erin R, PA-C  docusate sodium (COLACE) 100 MG capsule Take 1 capsule (100 mg total) by mouth 2 (two) times daily. 03/09/20   Antonieta Pert, MD  ondansetron (ZOFRAN) 4 MG tablet Take 1 tablet (4 mg total) by mouth every 6 (six) hours as needed for nausea. 03/09/20   Antonieta Pert, MD  prochlorperazine (COMPAZINE) 10 MG tablet Take 1 tablet (10 mg total)  by mouth every 6 (six) hours as needed for nausea or vomiting. 01/16/20   Ladell Pier, MD    Allergies    Patient has no known allergies.  Review of Systems   Review of Systems  Constitutional:       Per HPI, otherwise negative  HENT:       Per HPI, otherwise negative  Respiratory:       Per HPI, otherwise negative  Cardiovascular:       Per HPI, otherwise negative  Gastrointestinal: Negative for vomiting.       Abdomen distended, unchanged, he has been receiving intermittent paracentesis.  Colostomy bag unremarkable.  Endocrine:       Negative aside from HPI  Genitourinary:       Neg aside from HPI   Musculoskeletal:       Per HPI, otherwise negative  Skin: Negative.   Allergic/Immunologic: Positive for immunocompromised state.  Neurological: Positive for dizziness. Negative for syncope.    Physical Exam Updated Vital Signs BP 125/85   Pulse 88   Temp 97.6 F (36.4 C) (Oral)   Resp 17   Ht 5\' 6"  (1.676 m)   Wt 95.3 kg   SpO2 96%   BMI 33.89 kg/m   Physical  Exam Vitals and nursing note reviewed.  Constitutional:      General: He is not in acute distress.    Appearance: He is well-developed. He is ill-appearing.  HENT:     Head: Normocephalic and atraumatic.  Eyes:     Conjunctiva/sclera: Conjunctivae normal.  Cardiovascular:     Rate and Rhythm: Normal rate and regular rhythm.  Pulmonary:     Effort: Pulmonary effort is normal. No respiratory distress.     Breath sounds: No stridor.  Abdominal:     General: There is distension.     Comments: Colostomy bag, left-sided, patient denies changes from baseline, no tenderness to his abdomen.  Skin:    General: Skin is warm and dry.  Neurological:     Mental Status: He is alert and oriented to person, place, and time.     Cranial Nerves: Cranial nerves are intact. No dysarthria or facial asymmetry.     Motor: Atrophy present. No weakness or tremor.      ED Results / Procedures / Treatments   Labs (all labs ordered are listed, but only abnormal results are displayed) Labs Reviewed  COMPREHENSIVE METABOLIC PANEL - Abnormal; Notable for the following components:      Result Value   Sodium 131 (*)    Chloride 96 (*)    CO2 21 (*)    Glucose, Bld 122 (*)    BUN 58 (*)    Creatinine, Ser 2.30 (*)    Calcium 7.8 (*)    Total Protein 6.2 (*)    Albumin 2.5 (*)    AST 58 (*)    Alkaline Phosphatase 133 (*)    Total Bilirubin 2.9 (*)    GFR, Estimated 30 (*)    All other components within normal limits  CBC WITH DIFFERENTIAL/PLATELET - Abnormal; Notable for the following components:   WBC 11.1 (*)    RDW 18.3 (*)    Platelets 130 (*)    nRBC 0.4 (*)    Neutro Abs 8.1 (*)    Monocytes Absolute 1.7 (*)    Abs Immature Granulocytes 0.34 (*)    All other components within normal limits  LIPASE, BLOOD - Abnormal; Notable for the following components:  Lipase 57 (*)    All other components within normal limits  RESP PANEL BY RT PCR (RSV, FLU A&B, COVID)  ETHANOL    EKG EMS EKG  rate 97, T wave inversions inferior, 1-lead, low voltage, artifact, abnormal On monitor the patient has similar sinus rhythm, rate 90s, unremarkable. Radiology CT ABDOMEN PELVIS WO CONTRAST  Result Date: 04/04/2020 CLINICAL DATA:  Vomiting history colon and pancreatic cancer EXAM: CT ABDOMEN AND PELVIS WITHOUT CONTRAST TECHNIQUE: Multidetector CT imaging of the abdomen and pelvis was performed following the standard protocol without IV contrast. COMPARISON:  March 03, 2020 FINDINGS: Lower chest: The visualized heart size within normal limits. No pericardial fluid/thickening. No hiatal hernia. Streaky atelectasis seen at both lung bases. There is tiny scattered sub 5 mm pulmonary nodules at both lung bases. Hepatobiliary: Although limited due to the lack of intravenous contrast, normal in appearance without gross focal abnormality. No evidence of calcified gallstones or biliary ductal dilatation. Pancreas: Within the tail of the pancreas there is slight interval decrease in the hypoechoic lesion with peripheral calcifications now measuring 1.3 x 1.1 cm, previously 2.0 x 1.6 cm. Spleen: Normal in size. Although limited due to the lack of intravenous contrast, normal in appearance. Adrenals/Urinary Tract: Both adrenal glands appear normal. The kidneys and collecting system appear normal without evidence of urinary tract calculus or hydronephrosis. Bladder is unremarkable. Stomach/Bowel: The stomach, small bowel, are normal in appearance. Again noted is a left anterior abdominal colostomy. Remainder of the colon is unremarkable. Scattered colonic diverticula are noted. Vascular/Lymphatic: There are no enlarged abdominal or pelvic lymph nodes. Scattered aortic atherosclerosis seen. 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 Reproductive: The prostate is unremarkable. Other: A large amount of abdominopelvic ascites is noted. Small fat containing inguinal hernias are seen. Musculoskeletal: No acute or significant osseous  findings. IMPRESSION: 1. Large amount of abdominopelvic ascites. 2. Slight interval decreased size in the pancreatic tail lesion, now measuring 1.3 x 1.1 cm. 3. No definite hepatic lesion identified, however limited due to lack of intravenous contrast. 4. Stable bilateral tiny pulmonary nodules. 5.  Aortic Atherosclerosis (ICD10-I70.0). Electronically Signed   By: Prudencio Pair M.D.   On: 04/04/2020 19:31   CT Head Wo Contrast  Result Date: 04/04/2020 CLINICAL DATA:  Dizziness, vomiting, colon cancer EXAM: CT HEAD WITHOUT CONTRAST TECHNIQUE: Contiguous axial images were obtained from the base of the skull through the vertex without intravenous contrast. COMPARISON:  03/03/2020 FINDINGS: Brain: Chronic encephalomalacia within the left temporal region is stable. No acute infarct or hemorrhage. Lateral ventricles and midline structures are unremarkable. No acute extra-axial fluid collections. No mass effect. Vascular: No hyperdense vessel or unexpected calcification. Skull: Normal. Negative for fracture or focal lesion. Sinuses/Orbits: No acute finding. Other: None. IMPRESSION: 1. Stable exam, no acute process. Electronically Signed   By: Randa Ngo M.D.   On: 04/04/2020 16:11    Procedures Procedures (including critical care time)  Medications Ordered in ED Medications  sodium chloride 0.9 % bolus 1,000 mL (1,000 mLs Intravenous New Bag/Given 04/04/20 1703)    ED Course  I have reviewed the triage vital signs and the nursing notes.  Pertinent labs & imaging results that were available during my care of the patient were reviewed by me and considered in my medical decision making (see chart for details).    MDM Rules/Calculators/A&P                          5:08 PM Patient calm, in  no distress.  Dizziness has improved. Is no accompanied by his sister. We discussed all findings, including labs notable for BUN 58, creatinine 2.3, both elevated beyond baseline.  Some suspicion for the patient's  dizziness being secondary to this, dehydration, and he is receiving fluid resuscitation.  We reviewed the head CT, discussed with them both as well.  No evidence for acute new pathology, given the patient's denial of other focal neuro complaints, and no objective neuro deficiencies, although, though not 0 suspicion for brain metastases.  Patient will continue received fluid resuscitation, require reassessment. Patient is actively vomiting  Update:, With persistent vomiting, CT scan abdomen to exclude obstruction or progression of disease performed.  8:04 PM Now, in spite of additional antiemetics patient continues to have nausea with vomiting. I reviewed the CT scan, no evidence for acute obstruction.  However, given the patient's evidence for acute kidney injury, inability to tolerate oral meds, fluids, he will be admitted for monitoring, management and consideration of paracentesis tomorrow.  Final Clinical Impression(s) / ED Diagnoses Final diagnoses:  Dizziness  Dehydration  Bilious vomiting with nausea  AKI (acute kidney injury) ([place])   MDM Number of Diagnoses or Management Options Dehydration: new, needed workup Dizziness: new, needed workup   Amount and/or Complexity of Data Reviewed Clinical lab tests: reviewed Tests in the radiology section of CPT: reviewed Tests in the medicine section of CPT: reviewed Decide to obtain previous medical records or to obtain history from someone other than the patient: yes Obtain history from someone other than the patient: yes Review and summarize past medical records: yes Independent visualization of images, tracings, or specimens: yes  Risk of Complications, Morbidity, and/or Mortality Presenting problems: high Diagnostic procedures: high Management options: high  Critical Care Total time providing critical care: < 30 minutes  Patient Progress Patient progress: stable    Carmin Muskrat, MD 04/04/20 2006

## 2020-04-04 NOTE — ED Triage Notes (Signed)
Pt BIB EMS from home, called by son. Pt woke son up early am but went back to sleep, morning in kitchen had episode of dizziness and incontinence. No hx of incontinence. Disoriented to time, not baseline, pt is aware. Dizziness on standing, limited fluid intake last 2 days. Denies NVD. Hx of stroke, minor aphasia, no obvious neuro deficits. No odor to urine. Last chemo last Friday. Took today's Plavix.  BP 152/106 HR 95 RR 20 SpO2 97% RA CBG 103 Temp 96.7 F

## 2020-04-04 NOTE — Assessment & Plan Note (Addendum)
-  Cytology specimens from 03/03/2020 and 03/07/2020 were negative for malignancy, however given his underlying stage IV pancreatic cancer, suspicion remains high -Paracentesis ordered for the morning, follow-up cytology specimens again -Abdomen is soft and nontender, no concern for SBP at this time but will check cell count and culture regardless - hold hctz and spironolactone for now in setting of AKI

## 2020-04-05 ENCOUNTER — Inpatient Hospital Stay (HOSPITAL_COMMUNITY): Payer: Medicare Other

## 2020-04-05 DIAGNOSIS — R7989 Other specified abnormal findings of blood chemistry: Secondary | ICD-10-CM | POA: Diagnosis not present

## 2020-04-05 DIAGNOSIS — N179 Acute kidney failure, unspecified: Secondary | ICD-10-CM | POA: Diagnosis not present

## 2020-04-05 DIAGNOSIS — R188 Other ascites: Secondary | ICD-10-CM

## 2020-04-05 DIAGNOSIS — K7290 Hepatic failure, unspecified without coma: Secondary | ICD-10-CM

## 2020-04-05 DIAGNOSIS — K7682 Hepatic encephalopathy: Secondary | ICD-10-CM

## 2020-04-05 LAB — CBC WITH DIFFERENTIAL/PLATELET
Abs Immature Granulocytes: 0.21 10*3/uL — ABNORMAL HIGH (ref 0.00–0.07)
Basophils Absolute: 0 10*3/uL (ref 0.0–0.1)
Basophils Relative: 0 %
Eosinophils Absolute: 0 10*3/uL (ref 0.0–0.5)
Eosinophils Relative: 0 %
HCT: 34.3 % — ABNORMAL LOW (ref 39.0–52.0)
Hemoglobin: 11.4 g/dL — ABNORMAL LOW (ref 13.0–17.0)
Immature Granulocytes: 2 %
Lymphocytes Relative: 6 %
Lymphs Abs: 0.8 10*3/uL (ref 0.7–4.0)
MCH: 31 pg (ref 26.0–34.0)
MCHC: 33.2 g/dL (ref 30.0–36.0)
MCV: 93.2 fL (ref 80.0–100.0)
Monocytes Absolute: 2.1 10*3/uL — ABNORMAL HIGH (ref 0.1–1.0)
Monocytes Relative: 14 %
Neutro Abs: 11.3 10*3/uL — ABNORMAL HIGH (ref 1.7–7.7)
Neutrophils Relative %: 78 %
Platelets: 165 10*3/uL (ref 150–400)
RBC: 3.68 MIL/uL — ABNORMAL LOW (ref 4.22–5.81)
RDW: 18.6 % — ABNORMAL HIGH (ref 11.5–15.5)
WBC: 14.4 10*3/uL — ABNORMAL HIGH (ref 4.0–10.5)
nRBC: 0.5 % — ABNORMAL HIGH (ref 0.0–0.2)

## 2020-04-05 LAB — BLOOD GAS, ARTERIAL
Acid-base deficit: 4.8 mmol/L — ABNORMAL HIGH (ref 0.0–2.0)
Bicarbonate: 17.3 mmol/L — ABNORMAL LOW (ref 20.0–28.0)
Drawn by: 11249
FIO2: 21
O2 Saturation: 96.9 %
Patient temperature: 97.7
pCO2 arterial: 24.1 mmHg — ABNORMAL LOW (ref 32.0–48.0)
pH, Arterial: 7.467 — ABNORMAL HIGH (ref 7.350–7.450)
pO2, Arterial: 90.5 mmHg (ref 83.0–108.0)

## 2020-04-05 LAB — PROTIME-INR
INR: 1.5 — ABNORMAL HIGH (ref 0.8–1.2)
Prothrombin Time: 17.8 seconds — ABNORMAL HIGH (ref 11.4–15.2)

## 2020-04-05 LAB — COMPREHENSIVE METABOLIC PANEL
ALT: <5 U/L (ref 0–44)
AST: 62 U/L — ABNORMAL HIGH (ref 15–41)
Albumin: 2.6 g/dL — ABNORMAL LOW (ref 3.5–5.0)
Alkaline Phosphatase: 125 U/L (ref 38–126)
Anion gap: 20 — ABNORMAL HIGH (ref 5–15)
BUN: 55 mg/dL — ABNORMAL HIGH (ref 8–23)
CO2: 17 mmol/L — ABNORMAL LOW (ref 22–32)
Calcium: 8 mg/dL — ABNORMAL LOW (ref 8.9–10.3)
Chloride: 96 mmol/L — ABNORMAL LOW (ref 98–111)
Creatinine, Ser: 2.73 mg/dL — ABNORMAL HIGH (ref 0.61–1.24)
GFR, Estimated: 25 mL/min — ABNORMAL LOW (ref >60)
Glucose, Bld: 134 mg/dL — ABNORMAL HIGH (ref 70–99)
Potassium: 5 mmol/L (ref 3.5–5.1)
Sodium: 133 mmol/L — ABNORMAL LOW (ref 135–145)
Total Bilirubin: 3.2 mg/dL — ABNORMAL HIGH (ref 0.3–1.2)
Total Protein: 6 g/dL — ABNORMAL LOW (ref 6.5–8.1)

## 2020-04-05 LAB — BODY FLUID CELL COUNT WITH DIFFERENTIAL
Eos, Fluid: 0 %
Lymphs, Fluid: 6 %
Monocyte-Macrophage-Serous Fluid: 82 % (ref 50–90)
Neutrophil Count, Fluid: 12 % (ref 0–25)
Total Nucleated Cell Count, Fluid: 83 cu mm (ref 0–1000)

## 2020-04-05 LAB — MAGNESIUM: Magnesium: 2.5 mg/dL — ABNORMAL HIGH (ref 1.7–2.4)

## 2020-04-05 LAB — GRAM STAIN: Gram Stain: NONE SEEN

## 2020-04-05 LAB — AMMONIA: Ammonia: 177 umol/L — ABNORMAL HIGH (ref 9–35)

## 2020-04-05 MED ORDER — LACTULOSE ENEMA
300.0000 mL | Freq: Two times a day (BID) | ORAL | Status: DC
  Administered 2020-04-05 – 2020-04-06 (×4): 300 mL via RECTAL
  Filled 2020-04-05 (×11): qty 300

## 2020-04-05 MED ORDER — FENTANYL CITRATE (PF) 100 MCG/2ML IJ SOLN
12.5000 ug | INTRAMUSCULAR | Status: DC | PRN
  Administered 2020-04-06: 12.5 ug via INTRAVENOUS
  Filled 2020-04-05 (×2): qty 2

## 2020-04-05 MED ORDER — LACTULOSE 10 GM/15ML PO SOLN: 20.0000 g | Freq: Three times a day (TID) | ORAL | Status: DC

## 2020-04-05 MED ORDER — LIDOCAINE HCL 1 % IJ SOLN
INTRAMUSCULAR | Status: AC
  Filled 2020-04-05: qty 20

## 2020-04-05 NOTE — Progress Notes (Addendum)
Called to the bedside by RN with concerns for worsening confusion and SOB. On assessment, pt awake but gaze is unfocused. He answers to verbal stimuli and complains of needing to urinate. He does appear to be slightly SOB, LS diminished throughout. This is most likely from severe ascites. He is scheduled for a paracentesis at 8am this morning. This will most likely relieve some of his current symptoms. A phone call was made to pt's sister and we discussed pt's current [place] and worsening condition. She states that the son makes the current medical decisions for patient. The son, however, suffers from frequent seizures and rarely leaves the house or answer the phone. I was able to reach him at home and we discussed his current status and goals of care. He understands, pt disease process and likelihood of recovery at this time. He would ike to discuss pt's code status with his family and speak with the attending physician in the AM.   SOB - Oxygen as needed to keep O2 above 92% - Discontinue fluids at this time.  -Paracentesis in AM  AMS - CBC, CMP, Ammonia levels stat - Paracentesis may alleviate worseining confusion.   Lovey Newcomer, NP [facility] 7p-7a [phone]

## 2020-04-05 NOTE — Consult Note (Signed)
Consultation  Referring Provider: Dr. Alfredia Ferguson     Primary Care Physician:  Antonietta Jewel, MD Primary Gastroenterologist: Dr. Hilarie Fredrickson       Reason for Consultation: Elevated ammonia level, AMS         HPI:   Alex [name] is a 69 y.o. 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    03/05/2020 patient was seen by our service in the hospital for ascites and portal hypertension.  At that time he had had a recent paracentesis with removal of 5 L of fluid with no evidence for SBP.  He was continued on IV albumin and Lasix 20 mg IV every 8 hours with spironolactone added at 50 mg p.o. daily.    Patient admitted to the hospital 04/04/2020 with ongoing nausea and vomiting at home as well as some reports of dizziness also worsening abdominal distention (last paracentesis 03/30/2020 with 4 L removed), ostomy bag output had remained at baseline.    Today, patient is found in bed unresponsive to questioning, he does open his eyes and groans, but is unable to elicit history.  Per nursing staff his family member was with him earlier but no longer care now, per hospitalist notes from 04/04/2020 patient had described 3 episodes of vomiting at home prior to admission, also endorsed worsening abdominal distention and urinating less with a darker than normal color.  Ostomy bag output had remained at baseline.    Since admission patient has had paracentesis of removal greater than 5 L, we tried to give lactulose through the stoma but this was not very successful.  They are not giving anything p.o. as patient has altered mental status.  ER course: CT abdomen pelvis with large volume abdominal ascites, small decrease in pancreatic tail  lesion, stable bilateral pulmonary nodules  GI history: Colonoscopy 02/07/2018 by Dr. Hilarie Fredrickson:  - Widely patent end colostomy with healthy appearing mucosa in the descending colon. - One 5 mm TA polyp in the cecum, removed with a cold snare. Resected and retrieved. - Four 3 to 6 mm polyps in the ascending colon, removed with a cold snare. Resected and retrieved. - Six 4 to 8 mm TA polyps in the transverse colon, removed with a cold snare. Resected and retrieved. - Two 4 to 6 mm TA polyps in the descending colon, removed with a cold snare. Resected and retrieved. - Diverticulosis in the descending colon. - Non-patent end rectosigmoid stump, characterized by visible sutures and polypoid tissue. Biopsies were benign.  - One 6 mm TA polyp at the recto-sigmoid colon, removed with a hot snare. Resected and retrieved. - One 10 mm TA polyp in the rectum, removed with a hot snare. Resected and retrieved. - Internal hemorrhoids.  Flexible sigmoidoscopy 09/18/2017: - Likely malignant completely obstructing tumor in the distal sigmoid colon. Biopsied. Tattooed. - Two 5 to 12 mm polyps in the rectum and at the recto-sigmoid colon. Resection not attempted. - Internal hemorrhoids  ECHO 08/27/2017: - Left ventricle: The cavity size was normal. Wall thickness was  normal. Systolic function was vigorous. The estimated ejection  fraction was in the range of 65% to 70%. Wall motion was normal;  there were no regional wall motion abnormalities. Features are  consistent with a  pseudonormal left ventricular filling pattern,  with concomitant abnormal relaxation and increased filling  pressure (grade 2 diastolic dysfunction).  - Aortic valve: There was mild regurgitation.    Past Medical History:  Diagnosis Date  . Abdominal distension 09/14/2017  . Adenocarcinoma of sigmoid colon ([place]) 2019   with involvement of rectosigmoid junction  . Anemia 08/26/2017  . Aortic atherosclerosis ([place])  08/27/2017  . Atherosclerosis of arteries   . Carotid artery occlusion   . Colon obstruction ([place])   . Colon polyps   . Community acquired pneumonia of right lower lobe of lung 08/04/2017  . Dehydration   . Empyema ([place]) 08/27/2017  . Empyema of right pleural space ([place]) 08/30/2017  . High cholesterol   . History of stroke 08/04/2017  . Hypertension   . Hypokalemia 09/14/2017  . Hyponatremia 08/26/2017  . Large bowel obstruction ([place]) 09/13/2017  . Lobar pneumonia ([place]) 08/27/2017  . Malignant tumor of sigmoid colon ([place])   . Nausea & vomiting 09/14/2017  . Nausea and vomiting 09/14/2017  . Pleural effusion 08/25/2017  . Pleural effusion, right 08/27/2017  . Pneumonia   . Pulmonary nodule, right 08/27/2017  . Stroke [facility]) 2010   denies residual on 09/18/2017    Past Surgical History:  Procedure Laterality Date  . ANKLE FRACTURE SURGERY Right 1984  . DECORTICATION Right 08/30/2017   Procedure: DECORTICATION of right lower lung lobe;  Surgeon: Prescott Gum, Collier Salina, MD;  Location: [place];  Service: Thoracic;  Laterality: Right;  . FLEXIBLE SIGMOIDOSCOPY N/A 09/18/2017   Procedure: FLEXIBLE SIGMOIDOSCOPY;  Surgeon: Jerene Bears, MD;  Location[facility] ENDOSCOPY;  Service: Gastroenterology;  Laterality: N/A;  . IR PARACENTESIS  03/30/2020  . PARTIAL COLECTOMY N/A 09/20/2017   Procedure: OPEN PARTIAL COLECTOMY WITH COLOSTOMY;  Surgeon: Clovis Riley, MD;  Location: [place];  Service: General;  Laterality: N/A;  . PORTACATH PLACEMENT Right 09/20/2018   Procedure: INSERTION PORT-A-CATH WITH ULTRASOUND;  Surgeon: Clovis Riley, MD;  Location: [place];  Service: General;  Laterality: Right;  Marland Kitchen VIDEO ASSISTED THORACOSCOPY (VATS)/EMPYEMA Right 08/30/2017   Procedure: VIDEO ASSISTED THORACOSCOPY (VATS)/EMPYEMA   ;  Surgeon: Ivin Poot, MD;  Location: [facility] OR;  Service: Thoracic;  Laterality: Right;    Family History  Problem Relation Age of Onset  . Heart disease Other   . Hypertension Father     . Stroke Father   . Heart disease Father   . Diabetes Sister   . Diabetes Brother   . Head & neck cancer Brother     Social History   Tobacco Use  . Smoking status: Never Smoker  . Smokeless tobacco: Never Used  Vaping Use  . Vaping Use: Never used  Substance Use Topics  . Alcohol use: Not Currently    Alcohol/week: 0.0 standard drinks    Comment: 09/18/2017 "nothing since the 1990s"  . Drug use: Yes    Types: Marijuana    Comment: 09/18/2017 "nothing since the 1980s"    Prior to Admission medications   Medication Sig Start Date End Date Taking? Authorizing Provider  amLODipine (NORVASC) 10 MG tablet Take 10 mg by mouth daily.   Yes [provider]  aspirin EC 81 MG tablet Take 81 mg by mouth daily. Swallow whole.   Yes [provider]  atenolol (TENORMIN) 25 MG tablet Take 25 mg by mouth daily.   Yes [provider]  clopidogrel (PLAVIX) 75 MG tablet Take 75 mg by mouth daily.  Yes [provider]  furosemide (LASIX) 40 MG tablet Take 1 tablet (40 mg total) by mouth daily. 03/10/20 05/09/20 Yes Antonieta Pert, MD  gabapentin (NEURONTIN) 100 MG capsule Take 200 mg by mouth at bedtime.   Yes [provider]  hydrochlorothiazide (HYDRODIURIL) 25 MG tablet Take 25 mg by mouth daily.   Yes [provider]  lidocaine-prilocaine (EMLA) cream Apply 1 application topically as needed. Apply to portacath  1 1/2 -  2 hours prior to procedures as needed. Patient taking differently: Apply 1 application topically as needed (prior to port access (apply 1 1/2 -2 hours prior to procedures).  10/31/19  Yes Owens Shark, NP  simvastatin (ZOCOR) 20 MG tablet Take 20 mg by mouth at bedtime.    Yes [provider]  spironolactone (ALDACTONE) 50 MG tablet Take 1 tablet (50 mg total) by mouth daily. 03/09/20 05/08/20 Yes Antonieta Pert, MD  acetaminophen (TYLENOL) 500 MG tablet Take 2 tablets (1,000 mg total) by mouth every 6 (six) hours as  needed. Patient taking differently: Take 500 mg by mouth every 6 (six) hours as needed for moderate pain.  09/04/17   Barrett, Erin R, PA-C  docusate sodium (COLACE) 100 MG capsule Take 1 capsule (100 mg total) by mouth 2 (two) times daily. 03/09/20   Antonieta Pert, MD  ondansetron (ZOFRAN) 4 MG tablet Take 1 tablet (4 mg total) by mouth every 6 (six) hours as needed for nausea. 03/09/20   Antonieta Pert, MD  prochlorperazine (COMPAZINE) 10 MG tablet Take 1 tablet (10 mg total) by mouth every 6 (six) hours as needed for nausea or vomiting. 01/16/20   Ladell Pier, MD    Current Facility-Administered Medications  Medication Dose Route Frequency Provider Last Rate Last Admin  . acetaminophen (TYLENOL) tablet 650 mg  650 mg Oral Q6H PRN Dwyane Dee, MD       Or  . acetaminophen (TYLENOL) suppository 650 mg  650 mg Rectal Q6H PRN Dwyane Dee, MD      . albumin human 25 % solution 25 g  25 g Intravenous Q6H Dwyane Dee, MD 60 mL/hr at 04/05/20 0425 25 g at 04/05/20 0425  . amLODipine (NORVASC) tablet 10 mg  10 mg Oral Daily Dwyane Dee, MD      . atenolol (TENORMIN) tablet 25 mg  25 mg Oral Daily Dwyane Dee, MD      . docusate sodium (COLACE) capsule 100 mg  100 mg Oral BID Dwyane Dee, MD      . gabapentin (NEURONTIN) capsule 200 mg  200 mg Oral QHS Dwyane Dee, MD      . lactulose ([place]) 10 GM/15ML solution 20 g  20 g Oral TID Raiford Noble Latif, DO      . lactulose [facility]) enema 200 gm  300 mL Rectal BID Raiford Noble Latif, DO   300 mL at 04/05/20 1407  . lidocaine (XYLOCAINE) 1 % (with pres) injection           . ondansetron (ZOFRAN) tablet 4 mg  4 mg Oral Q6H PRN Dwyane Dee, MD       Or  . ondansetron [facility]) injection 4 mg  4 mg Intravenous Q6H PRN Dwyane Dee, MD   4 mg at 04/04/20 2338  . promethazine (PHENERGAN) injection 6.25 mg  6.25 mg Intravenous Q6H PRN Dwyane Dee, MD   6.25 mg at 04/05/20 0421  . sodium chloride flush (NS) 0.9 % injection 3 mL   3 mL Intravenous Q12H Girguis,  Shanon Brow, MD   3 mL at 04/04/20 2301    Allergies as of 04/04/2020  . (No Known Allergies)     Review of Systems:    Unable to elicit history due to mental status   Physical Exam:  Vital signs in last 24 hours: Temp:  [97.7 F (36.5 C)-98.3 F (36.8 C)] 98.3 F (36.8 C) (11/08 0932) Pulse Rate:  [85-112] 90 (11/08 1331) Resp:  [15-24] 16 (11/08 1331) BP: (104-149)/(56-95) 123/73 (11/08 1331) SpO2:  [95 %-100 %] 98 % (11/08 1331) Weight:  [95.3 kg] 95.3 kg (11/07 1428) Last BM Date: 04/05/20 General:   Ill-appearing Caucasian male appears to be in NAD, Well developed, Well nourished, disoriented Head:  Normocephalic and atraumatic. Eyes:   PEERL, EOMI. No icterus. Conjunctiva pink. Ears:  Normal auditory acuity. Neck:  Supple Throat: Oral cavity and pharynx without inflammation, swelling or lesion.  Lungs: Respirations even and unlabored. Lungs clear to auscultation bilaterally.   No wheezes, crackles, or rhonchi.  Heart: Normal S1, S2. No MRG. Regular rate and rhythm. No peripheral edema, cyanosis or pallor.  Abdomen:  Soft, nondistended,+ generalized TTP, groans when palpated, No rebound or guarding. Normal bowel sounds. No appreciable masses or hepatomegaly.+ Ostomy Rectal:  Not performed.  Msk:  Symmetrical without gross deformities. Peripheral pulses intact.  Extremities:  Without edema, no deformity or joint abnormality.  Neurologic: Sleep   Skin:   Dry and intact without significant lesions or rashes. Psychiatric: Unable to answer questioning, but does groan when palpated   LAB RESULTS: Recent Labs    04/04/20 1500 04/05/20 0256  WBC 11.1* 14.4*  HGB 13.2 11.4*  HCT 39.3 34.3*  PLT 130* 165   BMET Recent Labs    04/04/20 1500 04/05/20 0256  NA 131* 133*  K 4.9 5.0  CL 96* 96*  CO2 21* 17*  GLUCOSE 122* 134*  BUN 58* 55*  CREATININE 2.30* 2.73*  CALCIUM 7.8* 8.0*   LFT Recent Labs    04/05/20 0256  PROT 6.0*   ALBUMIN 2.6*  AST 62*  ALT <5  ALKPHOS 125  BILITOT 3.2*   PT/INR Recent Labs    04/05/20 0256  LABPROT 17.8*  INR 1.5*    STUDIES: CT ABDOMEN PELVIS WO CONTRAST  Result Date: 04/04/2020 CLINICAL DATA:  Vomiting history colon and pancreatic cancer EXAM: CT ABDOMEN AND PELVIS WITHOUT CONTRAST TECHNIQUE: Multidetector CT imaging of the abdomen and pelvis was performed following the standard protocol without IV contrast. COMPARISON:  March 03, 2020 FINDINGS: Lower chest: The visualized heart size within normal limits. No pericardial fluid/thickening. No hiatal hernia. Streaky atelectasis seen at both lung bases. There is tiny scattered sub 5 mm pulmonary nodules at both lung bases. Hepatobiliary: Although limited due to the lack of intravenous contrast, normal in appearance without gross focal abnormality. No evidence of calcified gallstones or biliary ductal dilatation. Pancreas: Within the tail of the pancreas there is slight interval decrease in the hypoechoic lesion with peripheral calcifications now measuring 1.3 x 1.1 cm, previously 2.0 x 1.6 cm. Spleen: Normal in size. Although limited due to the lack of intravenous contrast, normal in appearance. Adrenals/Urinary Tract: Both adrenal glands appear normal. The kidneys and collecting system appear normal without evidence of urinary tract calculus or hydronephrosis. Bladder is unremarkable. Stomach/Bowel: The stomach, small bowel, are normal in appearance. Again noted is a left anterior abdominal colostomy. Remainder of the colon is unremarkable. Scattered colonic diverticula are noted. Vascular/Lymphatic: There are no enlarged abdominal or pelvic lymph  nodes. Scattered aortic atherosclerosis seen. 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 Reproductive: The prostate is unremarkable. Other: A large amount of abdominopelvic ascites is noted. Small fat containing inguinal hernias are seen. Musculoskeletal: No acute or significant osseous findings.  IMPRESSION: 1. Large amount of abdominopelvic ascites. 2. Slight interval decreased size in the pancreatic tail lesion, now measuring 1.3 x 1.1 cm. 3. No definite hepatic lesion identified, however limited due to lack of intravenous contrast. 4. Stable bilateral tiny pulmonary nodules. 5.  Aortic Atherosclerosis (ICD10-I70.0). Electronically Signed   By: Prudencio Pair M.D.   On: 04/04/2020 19:31   CT Head Wo Contrast  Result Date: 04/04/2020 CLINICAL DATA:  Dizziness, vomiting, colon cancer EXAM: CT HEAD WITHOUT CONTRAST TECHNIQUE: Contiguous axial images were obtained from the base of the skull through the vertex without intravenous contrast. COMPARISON:  03/03/2020 FINDINGS: Brain: Chronic encephalomalacia within the left temporal region is stable. No acute infarct or hemorrhage. Lateral ventricles and midline structures are unremarkable. No acute extra-axial fluid collections. No mass effect. Vascular: No hyperdense vessel or unexpected calcification. Skull: Normal. Negative for fracture or focal lesion. Sinuses/Orbits: No acute finding. Other: None. IMPRESSION: 1. Stable exam, no acute process. Electronically Signed   By: Randa Ngo M.D.   On: 04/04/2020 16:11   US Paracentesis  Result Date: 04/05/2020 INDICATION: Patient with history of colon cancer, pancreatic cancer, encephalopathy, recurrent ascites. Request received for diagnostic and therapeutic paracentesis. EXAM: ULTRASOUND GUIDED DIAGNOSTIC AND THERAPEUTIC PARACENTESIS MEDICATIONS: 1% lidocaine to skin and subcutaneous tissue COMPLICATIONS: None immediate. PROCEDURE: Informed written consent was obtained from the patient's spouse/son after a discussion of the risks, benefits and alternatives to treatment. A timeout was performed prior to the initiation of the procedure. Initial ultrasound scanning demonstrates a large amount of ascites within the right lower abdominal quadrant. The right lower abdomen was prepped and draped in the usual  sterile fashion. 1% lidocaine was used for local anesthesia. Following this, a 19 gauge, 10 cm, Yueh catheter was introduced. An ultrasound image was saved for documentation purposes. The paracentesis was performed. The catheter was removed and a dressing was applied. The patient tolerated the procedure well without immediate post procedural complication. FINDINGS: A total of approximately 6.8 liters of hazy, yellow fluid was removed. Samples were sent to the laboratory as requested by the clinical team. IMPRESSION: Successful ultrasound-guided diagnostic and therapeutic paracentesis yielding 6.8 liters of peritoneal fluid. Read by: Rowe Robert, PA-C Electronically Signed   By: Jacqulynn Cadet M.D.   On: 04/05/2020 12:45    Impression / Plan:   Impression: 1.  Altered mental status: Uncertain etiology, no signs of liver disease other than liver mets 2.  Ascites: Cytology from 10/6 and 10/10 were negative for malignancy, paracentesis completed this morning was 6.8 L removed 3.  Elevated LFTs: Likely in the setting of underlying malignancy 4.  Stage IV adenocarcinoma of the pancreas: Diagnosed 05/27/2018, follows with oncology, currently on gemcitabine every 2 weeks, palliative care was consulted  Plan: 1.  Continue to hold HCTZ and Spironolactone in setting of AKI 2.  Would exclude other causes of altered mental status 3.  You could discuss placing NG tube and giving lactulose that way as well as crushed up rifaximin tabs if family is in agreement 4.  Per nursing staff family is going to have a discussion with palliative care, would hold off on any aggressive measures until this is done. 5.  Please await any further recommendations from Dr. Tarri Glenn later today.  We will sign off,  please call if we can be of any further assistance.  Thank you for your kind consultation.  Lavone Nian Lylia Karn  04/05/2020, 2:25 PM

## 2020-04-05 NOTE — Procedures (Signed)
Ultrasound-guided diagnostic and therapeutic paracentesis performed yielding 6.8 liters of hazy, yellow fluid. No immediate complications. A portion of the fluid was sent to the lab for preordered studies. EBL none.

## 2020-04-05 NOTE — ED Notes (Signed)
Report called. Pt is supposed to have paracentesis at 8am this morning. Manuela Schwartz, RN stated pt should just come up to floor after procedure.

## 2020-04-05 NOTE — ED Notes (Signed)
This nurse went in to pts room to do hourly rounds. Pt  had ripped his colostomy bag off. Pt's confusion has gotten substantially worse since the beginning of the shift. Pt has blank stare and will occasionally talk in sentences that make sense. Pt mostly mumbling. Ascites and respiratory status have gotten worse as well. Provider notified (hospitalist and ED doc)

## 2020-04-05 NOTE — ED Notes (Signed)
Provider in dept to assess pt and speak to family

## 2020-04-05 NOTE — Progress Notes (Signed)
PROGRESS NOTE    Alex [name]  NTZ:[id number] DOB: [birth year] DOA: 04/04/2020 PCP: Antonietta Jewel, MD  Brief Narrative:  HPI per Dr. Dwyane Dee on 04/04/2020 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 When asked, he states only approximately 3 episodes of vomiting at home prior to admission.  He also endorses worsening abdominal distention.  His last paracentesis was on 03/30/2020 with 4 L removed. He endorses urinating less than usual with darkening of color but cannot elaborate further. His ostomy bag output has remained at baseline with blend of solid and liquid stool.  ER work-up notable for: CT head unremarkable CT abdomen/pelvis: Large abdominal ascites, small decrease in pancreatic tail lesion.  Stable bilateral pulmonary nodules Labs: Na 131, K 4.9, Cl 96, Co2 21, Glucose 122, BUN 58, Creat 2.3, Alb 2.5, AST 58, ALT 23, AP 133, TB 2.9 WBC 11.1, Hgb 13.2, Hct 39.3, PLTC 130 Lipase 57  He has no known history of CKD prior to October 2021 with normal renal function seen in the past.  Due to his decreased urine output, increased creatinine, increased abdominal distention he is admitted for further treatment of AKI and obtaining paracentesis.   I have personally briefly reviewed patient's old medical records in [facility] and discussed patient with the ER provider when appropriate/indicated.  **Interim History  Remains confused on only minimally responsive to verbal stimuli and deep sternal rub.  Undergoing a  paracentesis today and removed 6.8 L.  Palliative care consulted for further goals of care discussion as well as GI for his hyperammonemia and hyperbilirubinemia.  Family does not really want to be aggressive and focus on palliative care and awaiting palliative care goals of care discussion and will consult medical oncology for further. He has a very poor prognosis  Assessment & Plan:   Principal Problem:   AKI (acute kidney injury) ([place]) Active Problems:   Ascites   Stage IV adenocarcinoma of pancreas (HCC)   Elevated LFTs   History of metastatic stage IV adenocarcinoma of the pancreas and history of stage II adenocarcinoma of the rectosigmoid status post partial colectomy with end colostomy -He was diagnosed with pancreatic cancer in 05/27/2018 follows with oncology -Currently getting gemcitabine every 2 weeks and got this last on 03/26/2019 -Patient continues to have declining quality of life, recurrent hospitalizations and recurrent ascites -We will notify his primary oncologist and I also consulted palliative care for further goals of care discussion given his very poor prognosis -Added IV fentanyl 12.5 mg every 2 as needed for severe pain  Acute Metabolic Encephalopathy  -Probably multifactorial but he does also have a hyperammonemia -Renal function is worsening and went from 58/2.30 is now 55/2.73 -Head CT showed "Stable exam, no acute process." -Continue to Monitor Mentation status -ABG done and showed a pH of 7.467, PCO2 of 24.1, PO2 of 90.5, bicarbonate level of 17.3 and an ABG O2 saturation 96.9 -Continue to Follow; Palliative Care Consulted for further evaluation and recc's  AKI Metabolic Acidosis -No prior renal dysfunction noted.  His creatinine became elevated October 2021  and has not recovered.  This may be due to deteriorating renal function in setting of his liver mets from malignancy vs hypoperfusion from severe ascites -Trial of albumin and low rate IVF given large  ascites; if SOB or worsening distension may need to d/c IVF -No hydronephrosis or urinary tract calculi seen on CT -Currently has condom catheter in place.  If does not void much overnight, may need indwelling catheter for ruling out outlet obstruction; suspect ascites will confound bladder scans -Patient's CO2 is now 17, AG is 20, Chloride Level is 96 -BUN/Cr is gone from 58/2.30 -> 55/2.73 -Avoid Nephrotoxic Medications, Contrast Dyes, Hypotension -Continue to Monitor and Trend and repeat CMP in the AM   Hyponatremia -Patient's Na+ is 131 -> 133 -? Hypervolemic Hyponatremia -Repeat CMP in the AM   Normocytic Anemia -Hb/Hct is 13.2/39.3 -> 11.4/34.3 -Check Anemia Panel in the AM -Continue to Monitor for S/Sx of Bleeding; Currently no overt bleeding noted -Repeat CBC in the AM   Ascites -He had cytology done on 03/03/2020 and 03/08/2019 which was negative for emergency He underwent a paracentesis today and had 6.8 L removed-- -we will need to follow-up on cytology specimens again next- -abdomen is soft but will need to check cell count and culture regardless for SBP next -continue to hold hydrochlorothiazide and spironolactone for now in the setting of AKI  Leukocytosis  -Worsening. WBC went from 11.1 -> 14.4 -? Reactive  -Continue to Monitor and Trend and evaluate for S/Sx of Infection -Repeat CBC in AM   Elevated AST; Coagulopathy -Patient's AST went from 58 -> 62; PT-INR was 17.8-1.5 -Has known Pancreatic Adenocarcinoma -CT Abd Pelvis w/o Contrast showed "Large amount of abdominopelvic ascites. Slight interval decreased size in the pancreatic tail lesion, now measuring 1.3 x 1.1 cm. No definite hepatic lesion identified, however limited due to lack of intravenous contrast. Stable bilateral tiny pulmonary nodules. Aortic Atherosclerosis." -Continue to Monitor and Trend Hepatic Fxn Panel  -Repeat CMP in the AM   Hyperbilirubinemia -Patient's T Bili is worsening -In the  setting of Metastasis -T Bili went from 2.9 -> 3.2 -Continue to Monitor and Trend -GI consulted for further evaluation and recommendations   Hyperammonemia -In the setting of newly diagnosed ? Cirrhosis from Mets to the Liver from Pancreatic Cancer; it is unclear if he has advanced fibrosis and cirrhosis and/or portal hypertension to support a diagnosis of hepatic encephalopathy -Patient's Ammonia Level was 177 and significant altered mental status -Unable to take po; Will order Lactulose Enema -Will ask GI to further evaluate; they are recommending x-ray other causes of altered mental status and discussing finding NG tube and giving lactulose that way as well as crushed rifaximin if family is in agreement; Dr. Tarri Glenn discussed management strategies including aggressively treating with lactulose, MiraLAX and Xifaxan for both diagnostic and therapeutic will benefit this would require an administration of NG tube but family has declined NG tube also focus on comfort with ideally palliative care so multiple for members can be with him -If able to take po Will start Lactulose 20 mg po TID  Obesity -Estimated body mass index is 33.89 kg/m as calculated from the following:   Height as of this encounter: 5\' 6"  (1.676 m).   Weight as of this encounter: 95.3 kg. -Not awake enough for Counseling   DVT prophylaxis: SCDs Code Status: DO NOT RESUSCITATE  Family Communication: No family present at bedside  Disposition Plan: Pending his further clinical course and question with palliative care  Status is: Inpatient  Remains inpatient appropriate because:Altered mental status, Unsafe d/c plan, IV treatments appropriate due to intensity of illness or inability to take PO and Inpatient level of care appropriate due to severity of illness   Dispo: The patient is from: Home              Anticipated d/c is to: TBD              Anticipated d/c date is: 2 days              Patient currently is not  medically stable to d/c.  Consultants:   Interventional Radiology  Gastroenterology  Palliative Care  Medical Oncology    Procedures: Paracentesis   Antimicrobials:  Anti-infectives (From admission, onward)   None        Subjective: Seen at bedside and he is confused and lethargic.  Minimally responsive to sternal rub and grunts.  Not awake enough to participate.  Abdomen is extremely distended and he has hyperbilirubinemia and hyperammonemia.  Unable to provide a subjective history and patient is to have a palliative care goals of care discussion and will notify his primary medical oncologist.  Objective: Vitals:   04/05/20 0830 04/05/20 0932 04/05/20 1000 04/05/20 1331  BP: 135/89 139/80 130/71 123/73  Pulse: 97 93 86 90  Resp: (!) 21 17  16   Temp:  98.3 F (36.8 C)    TempSrc:  Oral    SpO2: 100% 99% 98% 98%  Weight:      Height:        Intake/Output Summary (Last 24 hours) at 04/05/2020 1742 Last data filed at 04/05/2020 1400 Gross per 24 hour  Intake 1335 ml  Output 350 ml  Net 985 ml   Filed Weights   04/04/20 1428  Weight: 95.3 kg   Examination: Physical Exam:  Constitutional: WN/WD obese chronically ill appearing Caucasian male who is extremely confused and encephalopathic and not able to provide a subjective History Eyes: Lids and conjunctivae normal, sclerae anicteric  ENMT: External Ears, Nose appear normal. Grossly normal hearing.  Neck: Appears normal, supple, no cervical masses, normal ROM, no appreciable thyromegaly; no JVD Respiratory: Diminished to auscultation bilaterally, no wheezing, rales, rhonchi or crackles. Normal respiratory effort and patient is not tachypenic. No accessory muscle use. Unlabored breathing  Cardiovascular: RRR, no murmurs / rubs / gallops. S1 and S2 auscultated. 1+ extremity edema. 2+ pedal pulses. No carotid bruits.  Abdomen: Soft, non-tender, Distended significantly. Bowel sounds positive. Colostomy in place GU:  Deferred. Musculoskeletal: No clubbing / cyanosis of digits/nails. No joint deformity upper and lower extremities.  Skin: No rashes, lesions, ulcers on a limited skin evaluation. No induration; Warm and dry.  Neurologic: Does not follow commands and has pain on palpation of extremities Psychiatric: Impaired judgment and insight. Not Alert and oriented x 3.  Data Reviewed: I have personally reviewed following labs and imaging studies  CBC: Recent Labs  Lab 04/04/20 1500 04/05/20 0256  WBC 11.1* 14.4*  NEUTROABS 8.1* 11.3*  HGB 13.2 11.4*  HCT 39.3 34.3*  MCV 91.8 93.2  PLT 130* 366   Basic Metabolic Panel: Recent Labs  Lab 04/04/20 1500 04/05/20 0256  NA 131* 133*  K 4.9 5.0  CL 96* 96*  CO2 21* 17*  GLUCOSE 122* 134*  BUN 58* 55*  CREATININE 2.30* 2.73*  CALCIUM 7.8* 8.0*  MG  --  2.5*   GFR: Estimated Creatinine Clearance: 28 mL/min (A) (by C-G formula based on SCr  of 2.73 mg/dL (H)). Liver Function Tests: Recent Labs  Lab 04/04/20 1500 04/05/20 0256  AST 58* 62*  ALT 23 <5  ALKPHOS 133* 125  BILITOT 2.9* 3.2*  PROT 6.2* 6.0*  ALBUMIN 2.5* 2.6*   Recent Labs  Lab 04/04/20 1500  LIPASE 57*   Recent Labs  Lab 04/05/20 0256  AMMONIA 177*   Coagulation Profile: Recent Labs  Lab 04/05/20 0256  INR 1.5*   Cardiac Enzymes: No results for input(s): CKTOTAL, CKMB, CKMBINDEX, TROPONINI in the last 168 hours. BNP (last 3 results) No results for input(s): PROBNP in the last 8760 hours. HbA1C: No results for input(s): HGBA1C in the last 72 hours. CBG: No results for input(s): GLUCAP in the last 168 hours. Lipid Profile: No results for input(s): CHOL, HDL, LDLCALC, TRIG, CHOLHDL, LDLDIRECT in the last 72 hours. Thyroid Function Tests: No results for input(s): TSH, T4TOTAL, FREET4, T3FREE, THYROIDAB in the last 72 hours. Anemia Panel: No results for input(s): VITAMINB12, FOLATE, FERRITIN, TIBC, IRON, RETICCTPCT in the last 72 hours. Sepsis Labs: No  results for input(s): PROCALCITON, LATICACIDVEN in the last 168 hours.  Recent Results (from the past 240 hour(s))  Resp Panel by RT PCR (RSV, Flu A&B, Covid) - Nasopharyngeal Swab     Status: None   Collection Time: 04/04/20  8:13 PM   Specimen: Nasopharyngeal Swab  Result Value Ref Range Status   SARS Coronavirus 2 by RT PCR NEGATIVE NEGATIVE Final    Comment: (NOTE) SARS-CoV-2 target nucleic acids are NOT DETECTED.  The SARS-CoV-2 RNA is generally detectable in upper respiratoy specimens during the acute phase of infection. The lowest concentration of SARS-CoV-2 viral copies this assay can detect is 131 copies/mL. A negative result does not preclude SARS-Cov-2 infection and should not be used as the sole basis for treatment or other patient management decisions. A negative result may occur with  improper specimen collection/handling, submission of specimen other than nasopharyngeal swab, presence of viral mutation(s) within the areas targeted by this assay, and inadequate number of viral copies (<131 copies/mL). A negative result must be combined with clinical observations, patient history, and epidemiological information. The expected result is Negative.  Fact Sheet for Patients:  PinkCheek.be  Fact Sheet for Healthcare Providers:  GravelBags.it  This test is no t yet approved or cleared by the [place] FDA and  has been authorized for detection and/or diagnosis of SARS-CoV-2 by FDA under an Emergency Use Authorization (EUA). This EUA will remain  in effect (meaning this test can be used) for the duration of the COVID-19 declaration under Section 564(b)(1) of the Act, 21 U.S.C. section 360bbb-3(b)(1), unless the authorization is terminated or revoked sooner.     Influenza A by PCR NEGATIVE NEGATIVE Final   Influenza B by PCR NEGATIVE NEGATIVE Final    Comment: (NOTE) The Xpert Xpress SARS-CoV-2/FLU/RSV assay  is intended as an aid in  the diagnosis of influenza from Nasopharyngeal swab specimens and  should not be used as a sole basis for treatment. Nasal washings and  aspirates are unacceptable for Xpert Xpress SARS-CoV-2/FLU/RSV  testing.  Fact Sheet for Patients: PinkCheek.be  Fact Sheet for Healthcare Providers: GravelBags.it  This test is not yet approved or cleared by the [place] FDA and  has been authorized for detection and/or diagnosis of SARS-CoV-2 by  FDA under an Emergency Use Authorization (EUA). This EUA will remain  in effect (meaning this test can be used) for the duration of the  Covid-19 declaration under Section 564(b)(1)  of the Act, 21  U.S.C. section 360bbb-3(b)(1), unless the authorization is  terminated or revoked.    Respiratory Syncytial Virus by PCR NEGATIVE NEGATIVE Final    Comment: (NOTE) Fact Sheet for Patients: PinkCheek.be  Fact Sheet for Healthcare Providers: GravelBags.it  This test is not yet approved or cleared by the [place] FDA and  has been authorized for detection and/or diagnosis of SARS-CoV-2 by  FDA under an Emergency Use Authorization (EUA). This EUA will remain  in effect (meaning this test can be used) for the duration of the  COVID-19 declaration under Section 564(b)(1) of the Act, 21 U.S.C.  section 360bbb-3(b)(1), unless the authorization is terminated or  revoked. Performed at [facility], [place] [address], [place], [place] [address]   Culture, body fluid-bottle     Status: None (Preliminary result)   Collection Time: 04/05/20 12:22 PM   Specimen: Fluid  Result Value Ref Range Status   Specimen Description FLUID PERITONEAL  Final   Special Requests   Final    BOTTLES DRAWN AEROBIC AND ANAEROBIC Performed at [facility], [place] [address], [place], [place] [address]    Culture  PENDING  Incomplete   Report Status PENDING  Incomplete  Gram stain     Status: None (Preliminary result)   Collection Time: 04/05/20 12:22 PM   Specimen: Fluid  Result Value Ref Range Status   Specimen Description FLUID PERITONEAL  Final   Special Requests   Final    NONE Performed at [facility], [place] [address] [place], [place] [address]    Gram Stain PENDING  Incomplete   Report Status PENDING  Incomplete     RN Pressure Injury Documentation:     Estimated body mass index is 33.89 kg/m as calculated from the following:   Height as of this encounter: 5\' 6"  (1.676 m).   Weight as of this encounter: 95.3 kg.  Malnutrition Type:      Malnutrition Characteristics:      Nutrition Interventions:        Radiology Studies: CT ABDOMEN PELVIS WO CONTRAST  Result Date: 04/04/2020 CLINICAL DATA:  Vomiting history colon and pancreatic cancer EXAM: CT ABDOMEN AND PELVIS WITHOUT CONTRAST TECHNIQUE: Multidetector CT imaging of the abdomen and pelvis was performed following the standard protocol without IV contrast. COMPARISON:  March 03, 2020 FINDINGS: Lower chest: The visualized heart size within normal limits. No pericardial fluid/thickening. No hiatal hernia. Streaky atelectasis seen at both lung bases. There is tiny scattered sub 5 mm pulmonary nodules at both lung bases. Hepatobiliary: Although limited due to the lack of intravenous contrast, normal in appearance without gross focal abnormality. No evidence of calcified gallstones or biliary ductal dilatation. Pancreas: Within the tail of the pancreas there is slight interval decrease in the hypoechoic lesion with peripheral calcifications now measuring 1.3 x 1.1 cm, previously 2.0 x 1.6 cm. Spleen: Normal in size. Although limited due to the lack of intravenous contrast, normal in appearance. Adrenals/Urinary Tract: Both adrenal glands appear normal. The kidneys and collecting system appear normal without evidence of  urinary tract calculus or hydronephrosis. Bladder is unremarkable. Stomach/Bowel: The stomach, small bowel, are normal in appearance. Again noted is a left anterior abdominal colostomy. Remainder of the colon is unremarkable. Scattered colonic diverticula are noted. Vascular/Lymphatic: There are no enlarged abdominal or pelvic lymph nodes. Scattered aortic atherosclerosis seen. 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 Reproductive: The prostate is unremarkable. Other: A large amount of abdominopelvic ascites is noted. Small  fat containing inguinal hernias are seen. Musculoskeletal: No acute or significant osseous findings. IMPRESSION: 1. Large amount of abdominopelvic ascites. 2. Slight interval decreased size in the pancreatic tail lesion, now measuring 1.3 x 1.1 cm. 3. No definite hepatic lesion identified, however limited due to lack of intravenous contrast. 4. Stable bilateral tiny pulmonary nodules. 5.  Aortic Atherosclerosis (ICD10-I70.0). Electronically Signed   By: Prudencio Pair M.D.   On: 04/04/2020 19:31   CT Head Wo Contrast  Result Date: 04/04/2020 CLINICAL DATA:  Dizziness, vomiting, colon cancer EXAM: CT HEAD WITHOUT CONTRAST TECHNIQUE: Contiguous axial images were obtained from the base of the skull through the vertex without intravenous contrast. COMPARISON:  03/03/2020 FINDINGS: Brain: Chronic encephalomalacia within the left temporal region is stable. No acute infarct or hemorrhage. Lateral ventricles and midline structures are unremarkable. No acute extra-axial fluid collections. No mass effect. Vascular: No hyperdense vessel or unexpected calcification. Skull: Normal. Negative for fracture or focal lesion. Sinuses/Orbits: No acute finding. Other: None. IMPRESSION: 1. Stable exam, no acute process. Electronically Signed   By: Randa Ngo M.D.   On: 04/04/2020 16:11   US Paracentesis  Result Date: 04/05/2020 INDICATION: Patient with history of colon cancer, pancreatic cancer,  encephalopathy, recurrent ascites. Request received for diagnostic and therapeutic paracentesis. EXAM: ULTRASOUND GUIDED DIAGNOSTIC AND THERAPEUTIC PARACENTESIS MEDICATIONS: 1% lidocaine to skin and subcutaneous tissue COMPLICATIONS: None immediate. PROCEDURE: Informed written consent was obtained from the patient's spouse/son after a discussion of the risks, benefits and alternatives to treatment. A timeout was performed prior to the initiation of the procedure. Initial ultrasound scanning demonstrates a large amount of ascites within the right lower abdominal quadrant. The right lower abdomen was prepped and draped in the usual sterile fashion. 1% lidocaine was used for local anesthesia. Following this, a 19 gauge, 10 cm, Yueh catheter was introduced. An ultrasound image was saved for documentation purposes. The paracentesis was performed. The catheter was removed and a dressing was applied. The patient tolerated the procedure well without immediate post procedural complication. FINDINGS: A total of approximately 6.8 liters of hazy, yellow fluid was removed. Samples were sent to the laboratory as requested by the clinical team. IMPRESSION: Successful ultrasound-guided diagnostic and therapeutic paracentesis yielding 6.8 liters of peritoneal fluid. Read by: Rowe Robert, PA-C Electronically Signed   By: Jacqulynn Cadet M.D.   On: 04/05/2020 12:45   Scheduled Meds: . amLODipine  10 mg Oral Daily  . atenolol  25 mg Oral Daily  . docusate sodium  100 mg Oral BID  . gabapentin  200 mg Oral QHS  . lactulose  20 g Oral TID  . lactulose  300 mL Rectal BID  . lidocaine      . sodium chloride flush  3 mL Intravenous Q12H   Continuous Infusions: . albumin human 25 g (04/05/20 0425)    LOS: 1 day   Kerney Elbe, DO [facility] PAGER is on AMION  If 7PM-7AM, please contact night-coverage [email or web address]

## 2020-04-05 NOTE — ED Provider Notes (Addendum)
2:52 AM Patient awaiting admission and pending paracentesis in the morning.  I was called to the room due to altered mental status.  The patient is more confused and has not had any urine output.  He is wearing a condom catheter and it is unclear from the bedside ultrasound if he has a full bladder due to retention or if it is picking up his ascites.  We will place a Foley catheter.  We will also check a blood gas.  3:15 AM Ammonia is 177, assistant with acute hepatic encephalopathy which would explain the patient's nearly catatonic state.  6:29 AM Patient's advanced directives reviewed and discussed with the family (6 family members present).  It is clear that neither the patient nor his family wish for any heroic measures such as CPR or intubation at this stage in his illness.  We will make him a DNR but permit continue treatment for ascites and other interventions that may improve his quality of life or reduce suffering.  CRITICAL CARE Performed by: Karen Chafe Dmari Schubring Total critical care time: 30 minutes Critical care time was exclusive of separately billable procedures and treating other patients. Critical care was necessary to treat or prevent imminent or life-threatening deterioration. Critical care was time spent personally by me on the following activities: development of treatment plan with patient and/or surrogate as well as nursing, discussions with consultants, evaluation of patient's response to treatment, examination of patient, obtaining history from patient or surrogate, ordering and performing treatments and interventions, ordering and review of laboratory studies, ordering and review of radiographic studies, pulse oximetry and re-evaluation of patient's condition.     Feliciano Wynter, Jenny Reichmann, MD 04/05/20 0631    Shanon Rosser, MD 04/05/20 [phone]

## 2020-04-05 NOTE — ED Notes (Signed)
Family at bedside. DNR order signed by ED doc. HCPOA and rest of family agreeable to DNR

## 2020-04-06 DIAGNOSIS — E871 Hypo-osmolality and hyponatremia: Secondary | ICD-10-CM

## 2020-04-06 DIAGNOSIS — Z515 Encounter for palliative care: Secondary | ICD-10-CM | POA: Diagnosis not present

## 2020-04-06 DIAGNOSIS — N179 Acute kidney failure, unspecified: Principal | ICD-10-CM

## 2020-04-06 DIAGNOSIS — E872 Acidosis, unspecified: Secondary | ICD-10-CM

## 2020-04-06 DIAGNOSIS — R188 Other ascites: Secondary | ICD-10-CM | POA: Diagnosis not present

## 2020-04-06 DIAGNOSIS — C259 Malignant neoplasm of pancreas, unspecified: Secondary | ICD-10-CM | POA: Diagnosis not present

## 2020-04-06 DIAGNOSIS — G9341 Metabolic encephalopathy: Secondary | ICD-10-CM

## 2020-04-06 DIAGNOSIS — K7290 Hepatic failure, unspecified without coma: Secondary | ICD-10-CM | POA: Diagnosis not present

## 2020-04-06 DIAGNOSIS — R531 Weakness: Secondary | ICD-10-CM

## 2020-04-06 DIAGNOSIS — Z7189 Other specified counseling: Secondary | ICD-10-CM

## 2020-04-06 LAB — COMPREHENSIVE METABOLIC PANEL
ALT: 23 U/L (ref 0–44)
AST: 55 U/L — ABNORMAL HIGH (ref 15–41)
Albumin: 3.1 g/dL — ABNORMAL LOW (ref 3.5–5.0)
Alkaline Phosphatase: 102 U/L (ref 38–126)
Anion gap: 13 (ref 5–15)
BUN: 65 mg/dL — ABNORMAL HIGH (ref 8–23)
CO2: 22 mmol/L (ref 22–32)
Calcium: 8 mg/dL — ABNORMAL LOW (ref 8.9–10.3)
Chloride: 101 mmol/L (ref 98–111)
Creatinine, Ser: 2.52 mg/dL — ABNORMAL HIGH (ref 0.61–1.24)
GFR, Estimated: 27 mL/min — ABNORMAL LOW (ref >60)
Glucose, Bld: 104 mg/dL — ABNORMAL HIGH (ref 70–99)
Potassium: 4.3 mmol/L (ref 3.5–5.1)
Sodium: 136 mmol/L (ref 135–145)
Total Bilirubin: 3.3 mg/dL — ABNORMAL HIGH (ref 0.3–1.2)
Total Protein: 6.1 g/dL — ABNORMAL LOW (ref 6.5–8.1)

## 2020-04-06 LAB — CBC WITH DIFFERENTIAL/PLATELET
Abs Immature Granulocytes: 0.07 10*3/uL (ref 0.00–0.07)
Basophils Absolute: 0 10*3/uL (ref 0.0–0.1)
Basophils Relative: 0 %
Eosinophils Absolute: 0 10*3/uL (ref 0.0–0.5)
Eosinophils Relative: 0 %
HCT: 32 % — ABNORMAL LOW (ref 39.0–52.0)
Hemoglobin: 10.8 g/dL — ABNORMAL LOW (ref 13.0–17.0)
Immature Granulocytes: 1 %
Lymphocytes Relative: 8 %
Lymphs Abs: 0.9 10*3/uL (ref 0.7–4.0)
MCH: 30.9 pg (ref 26.0–34.0)
MCHC: 33.8 g/dL (ref 30.0–36.0)
MCV: 91.4 fL (ref 80.0–100.0)
Monocytes Absolute: 1.7 10*3/uL — ABNORMAL HIGH (ref 0.1–1.0)
Monocytes Relative: 14 %
Neutro Abs: 9.6 10*3/uL — ABNORMAL HIGH (ref 1.7–7.7)
Neutrophils Relative %: 77 %
Platelets: 116 10*3/uL — ABNORMAL LOW (ref 150–400)
RBC: 3.5 MIL/uL — ABNORMAL LOW (ref 4.22–5.81)
RDW: 19.3 % — ABNORMAL HIGH (ref 11.5–15.5)
WBC: 12.4 10*3/uL — ABNORMAL HIGH (ref 4.0–10.5)
nRBC: 0.2 % (ref 0.0–0.2)

## 2020-04-06 LAB — AMMONIA: Ammonia: 80 umol/L — ABNORMAL HIGH (ref 9–35)

## 2020-04-06 LAB — MAGNESIUM: Magnesium: 2.6 mg/dL — ABNORMAL HIGH (ref 1.7–2.4)

## 2020-04-06 LAB — CYTOLOGY - NON PAP

## 2020-04-06 LAB — PHOSPHORUS: Phosphorus: 5.7 mg/dL — ABNORMAL HIGH (ref 2.5–4.6)

## 2020-04-06 MED ORDER — HYDROMORPHONE HCL 1 MG/ML IJ SOLN
0.5000 mg | INTRAMUSCULAR | Status: DC | PRN
  Administered 2020-04-06: 0.5 mg via INTRAVENOUS
  Filled 2020-04-06: qty 0.5

## 2020-04-06 MED ORDER — LORAZEPAM 2 MG/ML IJ SOLN
0.5000 mg | Freq: Once | INTRAMUSCULAR | Status: AC
  Administered 2020-04-06: 18:00:00 0.5 mg via INTRAVENOUS
  Filled 2020-04-06: qty 1

## 2020-04-06 MED ORDER — SODIUM CHLORIDE 0.9% FLUSH
10.0000 mL | Freq: Two times a day (BID) | INTRAVENOUS | Status: DC
  Administered 2020-04-08: 10:00:00 10 mL

## 2020-04-06 MED ORDER — LORAZEPAM 2 MG/ML IJ SOLN
1.0000 mg | Freq: Once | INTRAMUSCULAR | Status: AC
  Administered 2020-04-06: 1 mg via INTRAVENOUS
  Filled 2020-04-06: qty 1

## 2020-04-06 MED ORDER — CHLORHEXIDINE GLUCONATE CLOTH 2 % EX PADS
6.0000 | MEDICATED_PAD | Freq: Every day | CUTANEOUS | Status: DC
  Administered 2020-04-07: 6 via TOPICAL

## 2020-04-06 MED ORDER — HYDROMORPHONE HCL 1 MG/ML IJ SOLN
0.5000 mg | INTRAMUSCULAR | Status: DC | PRN
  Administered 2020-04-06 – 2020-04-09 (×6): 0.5 mg via INTRAVENOUS
  Filled 2020-04-06 (×6): qty 0.5

## 2020-04-06 MED ORDER — SODIUM CHLORIDE 0.9% FLUSH: 10.0000 mL | INTRAVENOUS | Status: DC | PRN

## 2020-04-06 MED ORDER — HYDROMORPHONE HCL 1 MG/ML IJ SOLN
1.0000 mg | Freq: Four times a day (QID) | INTRAMUSCULAR | Status: DC
  Administered 2020-04-06 – 2020-04-08 (×6): 1 mg via INTRAVENOUS
  Filled 2020-04-06 (×7): qty 1

## 2020-04-06 NOTE — Consult Note (Signed)
Consultation Note Date: 04/06/2020   Patient Name: Alex [name]  DOB: 1950/12/17  MRN: [id number]  Age / Sex: 69 y.o., male  PCP: Antonietta Jewel, MD Referring Physician: Eugenie Filler, MD  Reason for Consultation: Establishing goals of care  HPI/Patient Profile: 69 y.o. male  admitted on 04/04/2020    Clinical Assessment and Goals of Care: Mr. Force is a 69 year old gentleman who lives at home with his son.  Patient presented to the emergency department with nausea vomiting abdominal distention acute kidney injury significant ascites worsening confusion.  Patient has past medical history significant for adenocarcinoma of rectosigmoid colon, pancreatic cancer, remote history of stroke, recently hospitalized for weakness abdominal distention.  Current hospital course also significant for GI being consulted for hyperammonemia and hyperbilirubinemia.  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  Patient is awake but confused resting in bed.  He is not able to provide full details of history.  Son Alex [name] is present at the bedside.  Patient was seen by palliative in a previous hospitalization where the patient had elected for choosing his son Alex [name] to be his healthcare power of attorney agent.  Introduced myself and palliative care as follows:  Palliative medicine is specialized medical care for people living with serious illness. It focuses on providing relief from the symptoms and stress of a serious illness. The goal is to improve quality of life for both the patient and the family.  Goals of care: Broad aims  of medical therapy in relation to the patient's values and preferences. Our aim is to provide medical care aimed at enabling patients to achieve the goals that matter most to them, given the circumstances of their particular medical situation and their constraints.   I had a family meeting with the patient's son outside of the patient's room as the patient was confused and not able to participate.  We took the patient's permission before exiting the room.  I discussed with the patient's son about the patient's serious life limiting illness, markedly limited prognosis, declining quality of life.  Patient son states that they have had family discussions and would like to seek more information about comfort measures only and hospice.  They have a family member's friend who was admitted to [facility] for end-of-life care and described that as a positive experience.  We discussed about appropriate pain and nonpain symptom management, exclusive focus on prioritizing comfort, liberalizing visitor access and prioritizing transfer to residential hospice facility.  Discussed in detail about these aspects, all questions answered to the best of my ability and to his satisfaction.  HCPOA  son Alex [name] is HCPOA agent, patient lives with son. Others in the patient's family include his sister, daughter and he is separated from his wife.   SUMMARY OF RECOMMENDATIONS  Agree with DO NOT RESUSCITATE Comfort measures only Chaplain consult Transitions of care consult Recommend consideration for residential hospice-[facility] as per patient's son preference Prognosis likely 2 weeks Code Status/Advance Care Planning:  DNR    Symptom Management:   Medication history noted.  Continue current mode of care  Palliative Prophylaxis:   Delirium Protocol  Additional Recommendations (Limitations, Scope, Preferences):  Full Comfort Care  Psycho-social/Spiritual:   Desire for further Chaplaincy  support:yes  Additional Recommendations: Education on Hospice  Prognosis:   < 2 weeks  Discharge Planning: Hospice facility      Primary Diagnoses: Present on Admission: . AKI (acute kidney injury) ([place]) . Ascites   I have reviewed the medical record, interviewed the patient and family, and examined the patient. The following aspects are pertinent.  Past Medical History:  Diagnosis Date  . Abdominal distension 09/14/2017  . Adenocarcinoma of sigmoid colon ([place]) 2019   with involvement of rectosigmoid junction  . Anemia 08/26/2017  . Aortic atherosclerosis ([place]) 08/27/2017  . Atherosclerosis of arteries   . Carotid artery occlusion   . Colon obstruction ([place])   . Colon polyps   . Community acquired pneumonia of right lower lobe of lung 08/04/2017  . Dehydration   . Empyema ([place]) 08/27/2017  . Empyema of right pleural space ([place]) 08/30/2017  . High cholesterol   . History of stroke 08/04/2017  . Hypertension   . Hypokalemia 09/14/2017  . Hyponatremia 08/26/2017  . Large bowel obstruction ([place]) 09/13/2017  . Lobar pneumonia ([place]) 08/27/2017  . Malignant tumor of sigmoid colon ([place])   . Nausea & vomiting 09/14/2017  . Nausea and vomiting 09/14/2017  . Pleural effusion 08/25/2017  . Pleural effusion, right 08/27/2017  . Pneumonia   . Pulmonary nodule, right 08/27/2017  . Stroke [facility]) 2010   denies residual on 09/18/2017   Social History   Socioeconomic History  . Marital status: Divorced    Spouse name: Not on file  . Number of children: 2  . Years of education: Not on file  . Highest education level: Not on file  Occupational History  . Not on file  Tobacco Use  . Smoking status: Never Smoker  . Smokeless tobacco: Never Used  Vaping Use  . Vaping Use: Never used  Substance and Sexual Activity  . Alcohol use: Not Currently    Alcohol/week: 0.0 standard drinks    Comment: 09/18/2017 "nothing since the 1990s"  . Drug use: Yes    Types: Marijuana    Comment: 09/18/2017 "nothing  since the 1980s"  . Sexual activity: Not Currently  Other Topics Concern  . Not on file  Social History Narrative  . Not on file   Social Determinants of Health   Financial Resource Strain:   . Difficulty of Paying Living Expenses: Not on file  Food Insecurity:   . Worried About Charity fundraiser in the Last Year: Not on file  . Ran Out of Food in the Last Year: Not on file  Transportation Needs:   . Lack of Transportation (Medical): Not on file  . Lack of Transportation (Non-Medical): Not on file  Physical Activity:   . Days of Exercise per Week: Not on file  . Minutes of Exercise per Session: Not on file  Stress:   . Feeling of Stress : Not on file  Social Connections:   . Frequency of Communication with Friends and Family: Not on file  . Frequency of Social Gatherings with Friends and Family: Not  on file  . Attends Religious Services: Not on file  . Active Member of Clubs or Organizations: Not on file  . Attends Archivist Meetings: Not on file  . Marital Status: Not on file   Family History  Problem Relation Age of Onset  . Heart disease Other   . Hypertension Father   . Stroke Father   . Heart disease Father   . Diabetes Sister   . Diabetes Brother   . Head & neck cancer Brother    Scheduled Meds: . amLODipine  10 mg Oral Daily  . atenolol  25 mg Oral Daily  . Chlorhexidine Gluconate Cloth  6 each Topical Daily  . docusate sodium  100 mg Oral BID  . gabapentin  200 mg Oral QHS  . lactulose  20 g Oral TID  . lactulose  300 mL Rectal BID  . sodium chloride flush  10-40 mL Intracatheter Q12H  . sodium chloride flush  3 mL Intravenous Q12H   Continuous Infusions: . albumin human 25 g (04/06/20 1000)   PRN Meds:.acetaminophen **OR** acetaminophen, HYDROmorphone (DILAUDID) injection, ondansetron **OR** ondansetron (ZOFRAN) IV, promethazine, sodium chloride flush Medications Prior to Admission:  Prior to Admission medications   Medication Sig Start  Date End Date Taking? Authorizing Provider  amLODipine (NORVASC) 10 MG tablet Take 10 mg by mouth daily.   Yes [provider]  aspirin EC 81 MG tablet Take 81 mg by mouth daily. Swallow whole.   Yes [provider]  atenolol (TENORMIN) 25 MG tablet Take 25 mg by mouth daily.   Yes [provider]  clopidogrel (PLAVIX) 75 MG tablet Take 75 mg by mouth daily.   Yes [provider]  furosemide (LASIX) 40 MG tablet Take 1 tablet (40 mg total) by mouth daily. 03/10/20 05/09/20 Yes Antonieta Pert, MD  gabapentin (NEURONTIN) 100 MG capsule Take 200 mg by mouth at bedtime.   Yes [provider]  hydrochlorothiazide (HYDRODIURIL) 25 MG tablet Take 25 mg by mouth daily.   Yes [provider]  lidocaine-prilocaine (EMLA) cream Apply 1 application topically as needed. Apply to portacath  1 1/2 -  2 hours prior to procedures as needed. Patient taking differently: Apply 1 application topically as needed (prior to port access (apply 1 1/2 -2 hours prior to procedures).  10/31/19  Yes Owens Shark, NP  simvastatin (ZOCOR) 20 MG tablet Take 20 mg by mouth at bedtime.    Yes [provider]  spironolactone (ALDACTONE) 50 MG tablet Take 1 tablet (50 mg total) by mouth daily. 03/09/20 05/08/20 Yes Antonieta Pert, MD  acetaminophen (TYLENOL) 500 MG tablet Take 2 tablets (1,000 mg total) by mouth every 6 (six) hours as needed. Patient taking differently: Take 500 mg by mouth every 6 (six) hours as needed for moderate pain.  09/04/17   Barrett, Erin R, PA-C  docusate sodium (COLACE) 100 MG capsule Take 1 capsule (100 mg total) by mouth 2 (two) times daily. 03/09/20   Antonieta Pert, MD  ondansetron (ZOFRAN) 4 MG tablet Take 1 tablet (4 mg total) by mouth every 6 (six) hours as needed for nausea. 03/09/20   Antonieta Pert, MD  prochlorperazine (COMPAZINE) 10 MG tablet Take 1 tablet (10 mg total) by mouth every 6 (six) hours as needed for nausea or vomiting. 01/16/20   Ladell Pier, MD   No Known Allergies Review of Systems Denies pain  Physical Exam Patient opens his eyes, states his name, is able to  state his son's name however beyond that he is very much confused Diminished breath sounds Diffuse purpuric/hemorrhagic areas both upper extremities Has colostomy Trace edema S1-S2  Vital Signs: BP 127/77 (BP Location: Left Arm)   Pulse 98   Temp 97.8 F (36.6 C) (Axillary)   Resp 20   Ht 5\' 6"  (1.676 m)   Wt 95.3 kg   SpO2 99%   BMI 33.89 kg/m  Pain Scale: Faces   Pain Score: 3    SpO2: SpO2: 99 % O2 Device:SpO2: 99 % O2 Flow Rate: .   IO: Intake/output summary:   Intake/Output Summary (Last 24 hours) at 04/06/2020 1435 Last data filed at 04/06/2020 1000 Gross per 24 hour  Intake 100.3 ml  Output 1950 ml  Net -1849.7 ml    LBM: Last BM Date: 04/06/20 Baseline Weight: Weight: 95.3 kg Most recent weight: Weight: 95.3 kg     Palliative Assessment/Data:   PPS 30%  Time In:  1300 Time Out:  1400 Time Total:  60  Greater than 50%  of this time was spent counseling and coordinating care related to the above assessment and plan.  Signed by: Loistine Chance, MD   Please contact Palliative Medicine Team phone at [phone] for questions and concerns.  For individual provider: See Shea Evans

## 2020-04-06 NOTE — Progress Notes (Signed)
PROGRESS NOTE    Alex [name]  ZDG:[id number] DOB: [birth year] DOA: 04/04/2020 PCP: Antonietta Jewel, MD    Chief Complaint  Patient presents with  . Dizziness    Brief Narrative:  HPI per Dr. Sabino Gasser 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 When asked, he states only approximately 3 episodes of vomiting at home prior to admission.  He also endorses worsening abdominal distention.  His last paracentesis was on 03/30/2020 with 4 L removed. He endorses urinating less than usual with darkening of color but cannot elaborate further. His ostomy bag output has remained at baseline with blend of solid and liquid stool.  ER work-up notable for: CT head unremarkable CT abdomen/pelvis: Large abdominal ascites, small decrease in pancreatic tail lesion.  Stable bilateral pulmonary nodules Labs: Na 131, K 4.9, Cl 96, Co2 21, Glucose 122, BUN 58, Creat 2.3, Alb 2.5, AST 58, ALT 23, AP 133, TB 2.9 WBC 11.1, Hgb 13.2, Hct 39.3, PLTC 130 Lipase 57  He has no known history of CKD prior to October 2021 with normal renal function seen in the past.  Due to his decreased urine output, increased creatinine, increased abdominal distention he is admitted for further treatment of AKI and obtaining paracentesis.   Assessment & Plan:   Principal Problem:   AKI (acute kidney injury) ([place]) Active Problems:   Hyponatremia   Ascites   Pancreatic cancer metastasized to liver (HCC)   Elevated LFTs   Hepatic encephalopathy (HCC)   Acute metabolic  encephalopathy   Metabolic acidosis  1 acute metabolic encephalopathy secondary to hepatic encephalopathy Likely secondary to hepatic encephalopathy as patient noted to have a elevated ammonia level on admission.  Patient with stage IV adenocarcinoma of the pancreas and history of stage II adenocarcinoma of the rectosigmoid status post partial colectomy with end colostomy..  Patient with mets to the liver.  Patient on admission noted also to be in acute kidney injury.  ABG done on admission with a pH of 7.46, PCO2 of 24, PO2 of 90, bicarb of 17.3, O2 sats of 96.9.  Ammonia level elevated at 177 and trending down currently at 80.  CT head negative for any acute abnormalities.  Patient alert however confused.  Patient not safe for oral intake.  Patient currently receiving lactulose enemas.  Patient seen in consultation by GI who had recommended treating with lactulose, MiraLAX and Xifaxan for diagnosis and therapeutic benefit however would require NG tube administration however family per GI no declined NG tube placement and wanted to focus on comfort care.  Discontinue oral lactulose.  Continue lactulose enemas.  Palliative care consultation pending.  2.  History of metastatic stage IV adenocarcinoma of the pancreas, history of stage II adenocarcinoma of the rectosigmoid status post partial colectomy with end colostomy Patient diagnosed with pancreatic cancer May 27, 2018 being followed by oncology.  Patient currently on gemcitabine every 2 weeks with last dose 03/26/2019.  Patient declining with declining quality of life, recurrent hospitalizations and recurrent ascites.  Patient status post paracentesis with 6.8 L removed.  Oncology notified  and are following.  Discontinue IV fentanyl and placed on IV Dilaudid as needed for pain control.  Palliative care consultation pending for goals of care.  Oncology following.  3.  Acute kidney injury/metabolic acidosis ??  Etiology.  May be secondary to a  prerenal azotemia versus secondary to hepatorenal syndrome in the setting of diuretics and spironolactone.  Last creatinine of 1.28 on 03/25/2020.  Patient noted to have presented with nausea vomiting and worsening abdominal distention.  Creatinine currently at 2.52.  CT abdomen and pelvis done on admission negative for hydronephrosis or urinary tract calculus.  Bladder unremarkable.  Patient status post ultrasound-guided paracentesis with 6.8 L removed.  Urinalysis not done on admission.  Patient with urine output of 1.550 L over the past 24 hours.  Patient on IV albumin.  Patient was on gentle hydration.  Avoid nephrotoxic medications.  Continue to hold HCTZ and spironolactone.  Palliative care consultation pending.  Follow.  4.  Hyponatremia Likely secondary to hypovolemic hyponatremia.  Status post ultrasound-guided paracentesis.  Sodium level currently at 136.  Status post gentle hydration.  Follow.  5.  Normocytic anemia Patient with no overt bleeding.  Hemoglobin currently at 10.8.  Transfusion threshold hemoglobin < 7.  Palliative care consultation pending.  6.  Ascites Questionable etiology.  Patient underwent paracentesis with cytology done 03/03/2020, 03/08/2019 which was negative for malignancy.  Status post ultrasound-guided paracentesis during this hospitalization with 6.8 L removed and cytology pending.  Continue fluid negative for any organisms and outpatient has SBP.  Continue lactulose enemas.  Supportive care.  7.  Obesity   DVT prophylaxis: SCDs Code Status: DNR Family Communication: No family at bedside. Disposition:   Status is: Inpatient    Dispo: The patient is from: Home              Anticipated d/c is to: To be determined              Anticipated d/c date is: To be determined              Patient currently confused, likely hepatic encephalopathy, deteriorating, stage II rectosigmoid adenocarcinoma status post partial colectomy with end colostomy, stage IV pancreatic  cancer with mets to the liver, splenic lesion, bilateral lung base nodules.  Not stable for discharge.       Consultants:   Palliative care pending  Oncology: Dr. Benay Spice  Gastroenterology: Dr. Tarri Glenn 04/05/2020  Procedures:  CT abdomen and pelvis 04/04/2020  CT head without contrast 04/04/2020  Ultrasound-guided paracentesis 04/05/2020 per IR Dr. Devra Dopp L of peritoneal fluid removed  Antimicrobials:   None   Subjective: Patient alert however confused.  Patient answering yes to all questions.  Objective: Vitals:   04/05/20 2155 04/06/20 0200 04/06/20 0538 04/06/20 1450  BP: 136/89 113/60 127/77 122/67  Pulse: 91 90 98 90  Resp: 20 20 20 18   Temp: 98.5 F (36.9 C) 97.6 F (36.4 C) 97.8 F (36.6 C)   TempSrc: Axillary Oral Axillary   SpO2: 98% 98% 99%   Weight:      Height:        Intake/Output Summary (Last 24 hours) at 04/06/2020 1516 Last data filed at 04/06/2020 1000 Gross per 24 hour  Intake 100.3 ml  Output 1950 ml  Net -1849.7 ml   Filed Weights   04/04/20 1428  Weight: 95.3 kg    Examination:  General exam: Alert.  Confused. Respiratory system: Clear to auscultation. Respiratory effort normal. Cardiovascular system: S1 & S2 heard, RRR. No  JVD, murmurs, rubs, gallops or clicks. No pedal edema. Gastrointestinal system: Abdomen is distended, hypoactive bowel sounds, soft, nontender to palpation, colostomy with stool noted. Central nervous system: Alert. No focal neurological deficits. Extremities: Symmetric 5 x 5 power. Skin: No rashes, lesions or ulcers Psychiatry: Judgement and insight appear poor. Mood & affect appropriate.     Data Reviewed: I have personally reviewed following labs and imaging studies  CBC: Recent Labs  Lab 04/04/20 1500 04/05/20 0256 04/06/20 0335  WBC 11.1* 14.4* 12.4*  NEUTROABS 8.1* 11.3* 9.6*  HGB 13.2 11.4* 10.8*  HCT 39.3 34.3* 32.0*  MCV 91.8 93.2 91.4  PLT 130* 165 116*    Basic Metabolic  Panel: Recent Labs  Lab 04/04/20 1500 04/05/20 0256 04/06/20 0335  NA 131* 133* 136  K 4.9 5.0 4.3  CL 96* 96* 101  CO2 21* 17* 22  GLUCOSE 122* 134* 104*  BUN 58* 55* 65*  CREATININE 2.30* 2.73* 2.52*  CALCIUM 7.8* 8.0* 8.0*  MG  --  2.5* 2.6*  PHOS  --   --  5.7*    GFR: Estimated Creatinine Clearance: 30.3 mL/min (A) (by C-G formula based on SCr of 2.52 mg/dL (H)).  Liver Function Tests: Recent Labs  Lab 04/04/20 1500 04/05/20 0256 04/06/20 0335  AST 58* 62* 55*  ALT 23 <5 23  ALKPHOS 133* 125 102  BILITOT 2.9* 3.2* 3.3*  PROT 6.2* 6.0* 6.1*  ALBUMIN 2.5* 2.6* 3.1*    CBG: No results for input(s): GLUCAP in the last 168 hours.   Recent Results (from the past 240 hour(s))  Resp Panel by RT PCR (RSV, Flu A&B, Covid) - Nasopharyngeal Swab     Status: None   Collection Time: 04/04/20  8:13 PM   Specimen: Nasopharyngeal Swab  Result Value Ref Range Status   SARS Coronavirus 2 by RT PCR NEGATIVE NEGATIVE Final    Comment: (NOTE) SARS-CoV-2 target nucleic acids are NOT DETECTED.  The SARS-CoV-2 RNA is generally detectable in upper respiratoy specimens during the acute phase of infection. The lowest concentration of SARS-CoV-2 viral copies this assay can detect is 131 copies/mL. A negative result does not preclude SARS-Cov-2 infection and should not be used as the sole basis for treatment or other patient management decisions. A negative result may occur with  improper specimen collection/handling, submission of specimen other than nasopharyngeal swab, presence of viral mutation(s) within the areas targeted by this assay, and inadequate number of viral copies (<131 copies/mL). A negative result must be combined with clinical observations, patient history, and epidemiological information. The expected result is Negative.  Fact Sheet for Patients:  PinkCheek.be  Fact Sheet for Healthcare Providers:    GravelBags.it  This test is no t yet approved or cleared by the [place] FDA and  has been authorized for detection and/or diagnosis of SARS-CoV-2 by FDA under an Emergency Use Authorization (EUA). This EUA will remain  in effect (meaning this test can be used) for the duration of the COVID-19 declaration under Section 564(b)(1) of the Act, 21 U.S.C. section 360bbb-3(b)(1), unless the authorization is terminated or revoked sooner.     Influenza A by PCR NEGATIVE NEGATIVE Final   Influenza B by PCR NEGATIVE NEGATIVE Final    Comment: (NOTE) The Xpert Xpress SARS-CoV-2/FLU/RSV assay is intended as an aid in  the diagnosis of influenza from Nasopharyngeal swab specimens and  should not be used as a sole basis for treatment. Nasal washings and  aspirates are unacceptable for Xpert Xpress SARS-CoV-2/FLU/RSV  testing.  Fact Sheet for Patients: PinkCheek.be  Fact Sheet for Healthcare Providers: GravelBags.it  This test is not yet approved or cleared by the [place] FDA and  has been authorized for detection and/or diagnosis of SARS-CoV-2 by  FDA under an Emergency Use Authorization (EUA). This EUA will remain  in effect (meaning this test can be used) for the duration of the  Covid-19 declaration under Section 564(b)(1) of the Act, 21  U.S.C. section 360bbb-3(b)(1), unless the authorization is  terminated or revoked.    Respiratory Syncytial Virus by PCR NEGATIVE NEGATIVE Final    Comment: (NOTE) Fact Sheet for Patients: PinkCheek.be  Fact Sheet for Healthcare Providers: GravelBags.it  This test is not yet approved or cleared by the [place] FDA and  has been authorized for detection and/or diagnosis of SARS-CoV-2 by  FDA under an Emergency Use Authorization (EUA). This EUA will remain  in effect (meaning this test can be  used) for the duration of the  COVID-19 declaration under Section 564(b)(1) of the Act, 21 U.S.C.  section 360bbb-3(b)(1), unless the authorization is terminated or  revoked. Performed at [facility], [place] [address], [place], [place] [address]   Culture, body fluid-bottle     Status: None (Preliminary result)   Collection Time: 04/05/20 12:22 PM   Specimen: Fluid  Result Value Ref Range Status   Specimen Description FLUID PERITONEAL  Final   Special Requests BOTTLES DRAWN AEROBIC AND ANAEROBIC  Final   Culture   Final    NO GROWTH < 24 HOURS Performed at [facility], [place] [address] [place], [place] [address]    Report Status PENDING  Incomplete  Gram stain     Status: None   Collection Time: 04/05/20 12:22 PM   Specimen: Fluid  Result Value Ref Range Status   Specimen Description FLUID PERITONEAL  Final   Special Requests NONE  Final   Gram Stain   Final    NO WBC SEEN NO ORGANISMS SEEN CYTOSPIN SMEAR Performed at [facility], [address], [place], [place] [address]    Report Status 04/05/2020 FINAL  Final         Radiology Studies: CT ABDOMEN PELVIS WO CONTRAST  Result Date: 04/04/2020 CLINICAL DATA:  Vomiting history colon and pancreatic cancer EXAM: CT ABDOMEN AND PELVIS WITHOUT CONTRAST TECHNIQUE: Multidetector CT imaging of the abdomen and pelvis was performed following the standard protocol without IV contrast. COMPARISON:  March 03, 2020 FINDINGS: Lower chest: The visualized heart size within normal limits. No pericardial fluid/thickening. No hiatal hernia. Streaky atelectasis seen at both lung bases. There is tiny scattered sub 5 mm pulmonary nodules at both lung bases. Hepatobiliary: Although limited due to the lack of intravenous contrast, normal in appearance without gross focal abnormality. No evidence of calcified gallstones or biliary ductal dilatation. Pancreas: Within the tail of the pancreas there is slight interval  decrease in the hypoechoic lesion with peripheral calcifications now measuring 1.3 x 1.1 cm, previously 2.0 x 1.6 cm. Spleen: Normal in size. Although limited due to the lack of intravenous contrast, normal in appearance. Adrenals/Urinary Tract: Both adrenal glands appear normal. The kidneys and collecting system appear normal without evidence of urinary tract calculus or hydronephrosis. Bladder is unremarkable. Stomach/Bowel: The stomach, small bowel, are normal in appearance. Again noted is a left anterior abdominal colostomy. Remainder of the colon is unremarkable. Scattered colonic diverticula are noted. Vascular/Lymphatic: There are no enlarged abdominal or pelvic lymph nodes. Scattered  aortic atherosclerosis seen. 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 Reproductive: The prostate is unremarkable. Other: A large amount of abdominopelvic ascites is noted. Small fat containing inguinal hernias are seen. Musculoskeletal: No acute or significant osseous findings. IMPRESSION: 1. Large amount of abdominopelvic ascites. 2. Slight interval decreased size in the pancreatic tail lesion, now measuring 1.3 x 1.1 cm. 3. No definite hepatic lesion identified, however limited due to lack of intravenous contrast. 4. Stable bilateral tiny pulmonary nodules. 5.  Aortic Atherosclerosis (ICD10-I70.0). Electronically Signed   By: Prudencio Pair M.D.   On: 04/04/2020 19:31   CT Head Wo Contrast  Result Date: 04/04/2020 CLINICAL DATA:  Dizziness, vomiting, colon cancer EXAM: CT HEAD WITHOUT CONTRAST TECHNIQUE: Contiguous axial images were obtained from the base of the skull through the vertex without intravenous contrast. COMPARISON:  03/03/2020 FINDINGS: Brain: Chronic encephalomalacia within the left temporal region is stable. No acute infarct or hemorrhage. Lateral ventricles and midline structures are unremarkable. No acute extra-axial fluid collections. No mass effect. Vascular: No hyperdense vessel or unexpected calcification.  Skull: Normal. Negative for fracture or focal lesion. Sinuses/Orbits: No acute finding. Other: None. IMPRESSION: 1. Stable exam, no acute process. Electronically Signed   By: Randa Ngo M.D.   On: 04/04/2020 16:11   US Paracentesis  Result Date: 04/05/2020 INDICATION: Patient with history of colon cancer, pancreatic cancer, encephalopathy, recurrent ascites. Request received for diagnostic and therapeutic paracentesis. EXAM: ULTRASOUND GUIDED DIAGNOSTIC AND THERAPEUTIC PARACENTESIS MEDICATIONS: 1% lidocaine to skin and subcutaneous tissue COMPLICATIONS: None immediate. PROCEDURE: Informed written consent was obtained from the patient's spouse/son after a discussion of the risks, benefits and alternatives to treatment. A timeout was performed prior to the initiation of the procedure. Initial ultrasound scanning demonstrates a large amount of ascites within the right lower abdominal quadrant. The right lower abdomen was prepped and draped in the usual sterile fashion. 1% lidocaine was used for local anesthesia. Following this, a 19 gauge, 10 cm, Yueh catheter was introduced. An ultrasound image was saved for documentation purposes. The paracentesis was performed. The catheter was removed and a dressing was applied. The patient tolerated the procedure well without immediate post procedural complication. FINDINGS: A total of approximately 6.8 liters of hazy, yellow fluid was removed. Samples were sent to the laboratory as requested by the clinical team. IMPRESSION: Successful ultrasound-guided diagnostic and therapeutic paracentesis yielding 6.8 liters of peritoneal fluid. Read by: Rowe Robert, PA-C Electronically Signed   By: Jacqulynn Cadet M.D.   On: 04/05/2020 12:45        Scheduled Meds: . amLODipine  10 mg Oral Daily  . atenolol  25 mg Oral Daily  . Chlorhexidine Gluconate Cloth  6 each Topical Daily  . docusate sodium  100 mg Oral BID  . gabapentin  200 mg Oral QHS  . lactulose  300 mL  Rectal BID  . sodium chloride flush  10-40 mL Intracatheter Q12H  . sodium chloride flush  3 mL Intravenous Q12H   Continuous Infusions: . albumin human 25 g (04/06/20 1000)     LOS: 2 days    Time spent: 40 minutes    Irine Seal, MD [facility]   To contact the attending provider between 7A-7P or the covering provider during after hours 7P-7A, please log into the web site [email or web address] and access using universal [place] password for that web site. If you do not have the password, please call the hospital operator.  04/06/2020, 3:16 PM

## 2020-04-06 NOTE — Consult Note (Signed)
[facility] Nurse ostomy consult note Stoma type/location: RLQ, present prior to admission Managed at home by patient and family. Using 4" pouch due to prolapse. Needs for ostomy supplies for ostomy irrigation per bedside nurse.  Stomal assessment/size: pouch intact Peristomal assessment: NA Treatment options for stomal/peristomal skin: NA Output brown, pasty Ostomy pouching: 2pc. 4" in place. Updated orders for irrigation supplies as requested.  Education provided: NA Order Supplies:  [id number] 4" kit (to be used when not irrigating patient) 641 ostomy clamp (to be used on irrigation sleeve) 98836 irrigation sleeve (to be attached to 2 3/4" skin barrier) 648 Irrigation kit- DO NOT TOSS OUT REUSE [place] # 2  2 3/4" skin barrier (to be used to attach irrigation sleeve)    Re consult if needed, will not follow at this time. Thanks  Curtez Brallier [employer], RN,CWOCN, CNS, [place] [phone])

## 2020-04-06 NOTE — Progress Notes (Addendum)
HEMATOLOGY-ONCOLOGY PROGRESS NOTE  SUBJECTIVE: Alex [name] presented to the ER with nausea, vomiting, abdominal distention.  The patient had AKI admission and significant ascites noted on CT scan.  He has developed worsening confusion this admission.  He underwent an ultrasound-guided paracentesis on 04/05/2020 and 6.8 L of fluid was removed.  Fluid has been sent for cytology which is currently pending.  When seen today, no family is at the bedside.  The patient is attempting to climb out of bed.  Unable to answer any of my questions today.  Oncology History  Malignant tumor of sigmoid colon [facility])   Initial Diagnosis   Malignant tumor of sigmoid colon ([place])   08/26/2018 Genetic Testing   Negative genetic testing.  The Hereditary Gene Panel offered by [facility] includes sequencing and/or deletion duplication testing of the following 48 genes: APC, ATM, AXIN2, BARD1, BLM, BMPR1A, BRCA1, BRCA2, BRIP1, BUB1B, CDH1, CDK4, CDKN2A (p14ARF), CDKN2A (p16INK4a), CEP57, CHEK2, CTNNA1, DICER1, ENG, EPCAM (Deletion/duplication testing only), FLCN, GALANT12, GREM1 (promoter region deletion/duplication testing only), KIT, MEN1, MLH1, MLH3, MSH2, MSH3, MSH6, MUTYH, NBN, NF1, NHTL1, PALB2, PDGFRA, PMS2, POLD1, POLE, PTEN, RAD50, RAD51C, RAD51D, RNF43, SDHB, SDHC, SDHD, SMAD4, SMARCA4. STK11, TP53, TSC1, TSC2, and VHL.  The following genes were evaluated for sequence changes only: SDHA and HOXB13 c.251G>A variant only. The report date is August 26, 2018.   Cancer of pancreas, tail ([place])  10/04/2018 Initial Diagnosis   Cancer of pancreas, tail ([place])   10/11/2018 -  Chemotherapy   The patient had PACLitaxel-protein bound (ABRAXANE) chemo infusion 250 mg, 125 mg/m2 = 250 mg (100 % of original dose 125 mg/m2), Intravenous,  Once, 9 of 9 cycles Dose modification: 125 mg/m2 (original dose 125 mg/m2, Cycle 2), 100 mg/m2 (original dose 100 mg/m2, Cycle 13), 80 mg/m2 (original dose 100 mg/m2, Cycle 16, Reason: Provider  Judgment) Administration: 250 mg (11/08/2018), 250 mg (11/22/2018), 250 mg (12/06/2018), 250 mg (12/20/2018), 250 mg (01/03/2019), 250 mg (01/17/2019), 250 mg (01/31/2019), 250 mg (02/13/2019), 200 mg (10/03/2019), 200 mg (10/17/2019), 200 mg (10/31/2019), 200 mg (11/14/2019), 200 mg (11/28/2019), 200 mg (12/12/2019), 175 mg (01/02/2020), 175 mg (01/23/2020) gemcitabine (GEMZAR) 2,000 mg in sodium chloride 0.9 % 250 mL chemo infusion, 2,000 mg, Intravenous,  Once, 1 of 1 cycle Administration: 2,000 mg (12/06/2018) gemcitabine (GEMZAR) 2,090 mg in sodium chloride 0.9 % 250 mL chemo infusion, 1,000 mg/m2 = 2,090 mg, Intravenous,  Once, 18 of 18 cycles Dose modification: 800 mg/m2 (original dose 1,000 mg/m2, Cycle 16, Reason: Provider Judgment), 800 mg/m2 (original dose 1,000 mg/m2, Cycle 17, Reason: Provider Judgment) Administration: 2,090 mg (10/11/2018), 2,000 mg (10/25/2018), 2,000 mg (11/08/2018), 2,000 mg (11/22/2018), 2,000 mg (12/20/2018), 2,000 mg (01/03/2019), 2,000 mg (01/17/2019), 2,000 mg (01/31/2019), 2,000 mg (02/13/2019), 2,000 mg (02/28/2019), 2,000 mg (03/14/2019), 2,000 mg (03/28/2019), 2,000 mg (04/11/2019), 2,000 mg (04/29/2019), 2,000 mg (05/16/2019), 2,000 mg (05/31/2019), 2,000 mg (06/13/2019), 2,000 mg (06/27/2019), 2,000 mg (07/11/2019), 2,000 mg (07/25/2019), 2,000 mg (08/08/2019), 2,000 mg (08/22/2019), 2,000 mg (09/05/2019), 2,000 mg (09/19/2019), 2,000 mg (10/03/2019), 2,000 mg (10/17/2019), 2,000 mg (10/31/2019), 2,000 mg (11/14/2019), 2,000 mg (11/28/2019), 2,000 mg (12/12/2019), 1,634 mg (01/02/2020), 1,634 mg (01/23/2020), 1,634 mg (02/13/2020), 1,634 mg (03/25/2020) PACLitaxel-protein bound (ABRAXANE) chemo infusion 250 mg, 125 mg/m2 = 250 mg (100 % of original dose 125 mg/m2), Intravenous, Once, 1 of 1 cycle Dose modification: 100 mg/m2 (original dose 125 mg/m2, Cycle 1, Reason: Provider Judgment), 125 mg/m2 (original dose 125 mg/m2, Cycle 1, Reason: Provider Judgment) Administration: 250 mg (10/11/2018), 250 mg (10/25/2018)  for  chemotherapy treatment.     PHYSICAL EXAMINATION:  Vitals:   04/06/20 0200 04/06/20 0538  BP: 113/60 127/77  Pulse: 90 98  Resp: 20 20  Temp: 97.6 F (36.4 C) 97.8 F (36.6 C)  SpO2: 98% 99%   Filed Weights   04/04/20 1428  Weight: 95.3 kg    Intake/Output from previous day: 11/08 0701 - 11/09 0700 In: 435.3 [IV Piggyback:435.3] Out: 1800 [Urine:1550; Stool:250]  GENERAL: Alert, confused LUNGS: Diminished breath sounds, poor inspiratory effort HEART: regular rate & rhythm and no murmurs, 1+ lower extremity edema ABDOMEN: Distended, nontender, colostomy with brown stool NEURO: Opens eyes, does not answer my questions-specifically cannot tell me his name or where he is  LABORATORY DATA:  I have reviewed the data as listed CMP Latest Ref Rng & Units 04/06/2020 04/05/2020 04/04/2020  Glucose 70 - 99 mg/dL 104(H) 134(H) 122(H)  BUN 8 - 23 mg/dL 65(H) 55(H) 58(H)  Creatinine 0.61 - 1.24 mg/dL 2.52(H) 2.73(H) 2.30(H)  Sodium 135 - 145 mmol/L 136 133(L) 131(L)  Potassium 3.5 - 5.1 mmol/L 4.3 5.0 4.9  Chloride 98 - 111 mmol/L 101 96(L) 96(L)  CO2 22 - 32 mmol/L 22 17(L) 21(L)  Calcium 8.9 - 10.3 mg/dL 8.0(L) 8.0(L) 7.8(L)  Total Protein 6.5 - 8.1 g/dL 6.1(L) 6.0(L) 6.2(L)  Total Bilirubin 0.3 - 1.2 mg/dL 3.3(H) 3.2(H) 2.9(H)  Alkaline Phos 38 - 126 U/L 102 125 133(H)  AST 15 - 41 U/L 55(H) 62(H) 58(H)  ALT 0 - 44 U/L 23 <5 23    Lab Results  Component Value Date   WBC 12.4 (H) 04/06/2020   HGB 10.8 (L) 04/06/2020   HCT 32.0 (L) 04/06/2020   MCV 91.4 04/06/2020   PLT 116 (L) 04/06/2020   NEUTROABS 9.6 (H) 04/06/2020    CT ABDOMEN PELVIS WO CONTRAST  Result Date: 04/04/2020 CLINICAL DATA:  Vomiting history colon and pancreatic cancer EXAM: CT ABDOMEN AND PELVIS WITHOUT CONTRAST TECHNIQUE: Multidetector CT imaging of the abdomen and pelvis was performed following the standard protocol without IV contrast. COMPARISON:  March 03, 2020 FINDINGS: Lower chest: The visualized  heart size within normal limits. No pericardial fluid/thickening. No hiatal hernia. Streaky atelectasis seen at both lung bases. There is tiny scattered sub 5 mm pulmonary nodules at both lung bases. Hepatobiliary: Although limited due to the lack of intravenous contrast, normal in appearance without gross focal abnormality. No evidence of calcified gallstones or biliary ductal dilatation. Pancreas: Within the tail of the pancreas there is slight interval decrease in the hypoechoic lesion with peripheral calcifications now measuring 1.3 x 1.1 cm, previously 2.0 x 1.6 cm. Spleen: Normal in size. Although limited due to the lack of intravenous contrast, normal in appearance. Adrenals/Urinary Tract: Both adrenal glands appear normal. The kidneys and collecting system appear normal without evidence of urinary tract calculus or hydronephrosis. Bladder is unremarkable. Stomach/Bowel: The stomach, small bowel, are normal in appearance. Again noted is a left anterior abdominal colostomy. Remainder of the colon is unremarkable. Scattered colonic diverticula are noted. Vascular/Lymphatic: There are no enlarged abdominal or pelvic lymph nodes. Scattered aortic atherosclerosis seen. 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 Reproductive: The prostate is unremarkable. Other: A large amount of abdominopelvic ascites is noted. Small fat containing inguinal hernias are seen. Musculoskeletal: No acute or significant osseous findings. IMPRESSION: 1. Large amount of abdominopelvic ascites. 2. Slight interval decreased size in the pancreatic tail lesion, now measuring 1.3 x 1.1 cm. 3. No definite hepatic lesion identified, however limited due to lack  of intravenous contrast. 4. Stable bilateral tiny pulmonary nodules. 5.  Aortic Atherosclerosis (ICD10-I70.0). Electronically Signed   By: Prudencio Pair M.D.   On: 04/04/2020 19:31   CT Head Wo Contrast  Result Date: 04/04/2020 CLINICAL DATA:  Dizziness, vomiting, colon cancer EXAM: CT  HEAD WITHOUT CONTRAST TECHNIQUE: Contiguous axial images were obtained from the base of the skull through the vertex without intravenous contrast. COMPARISON:  03/03/2020 FINDINGS: Brain: Chronic encephalomalacia within the left temporal region is stable. No acute infarct or hemorrhage. Lateral ventricles and midline structures are unremarkable. No acute extra-axial fluid collections. No mass effect. Vascular: No hyperdense vessel or unexpected calcification. Skull: Normal. Negative for fracture or focal lesion. Sinuses/Orbits: No acute finding. Other: None. IMPRESSION: 1. Stable exam, no acute process. Electronically Signed   By: Randa Ngo M.D.   On: 04/04/2020 16:11   US Paracentesis  Result Date: 04/05/2020 INDICATION: Patient with history of colon cancer, pancreatic cancer, encephalopathy, recurrent ascites. Request received for diagnostic and therapeutic paracentesis. EXAM: ULTRASOUND GUIDED DIAGNOSTIC AND THERAPEUTIC PARACENTESIS MEDICATIONS: 1% lidocaine to skin and subcutaneous tissue COMPLICATIONS: None immediate. PROCEDURE: Informed written consent was obtained from the patient's spouse/son after a discussion of the risks, benefits and alternatives to treatment. A timeout was performed prior to the initiation of the procedure. Initial ultrasound scanning demonstrates a large amount of ascites within the right lower abdominal quadrant. The right lower abdomen was prepped and draped in the usual sterile fashion. 1% lidocaine was used for local anesthesia. Following this, a 19 gauge, 10 cm, Yueh catheter was introduced. An ultrasound image was saved for documentation purposes. The paracentesis was performed. The catheter was removed and a dressing was applied. The patient tolerated the procedure well without immediate post procedural complication. FINDINGS: A total of approximately 6.8 liters of hazy, yellow fluid was removed. Samples were sent to the laboratory as requested by the clinical team.  IMPRESSION: Successful ultrasound-guided diagnostic and therapeutic paracentesis yielding 6.8 liters of peritoneal fluid. Read by: Rowe Robert, PA-C Electronically Signed   By: Jacqulynn Cadet M.D.   On: 04/05/2020 12:45   US Paracentesis  Result Date: 03/19/2020 INDICATION: Patient with history of colon and pancreatic cancer, recurrent ascites. Request made for therapeutic paracentesis up to 5 liters. EXAM: ULTRASOUND GUIDED THERAPEUTIC  PARACENTESIS MEDICATIONS: 1% lidocaine to skin and subcutaneous tissue COMPLICATIONS: None immediate. PROCEDURE: Informed written consent was obtained from the patient after a discussion of the risks, benefits and alternatives to treatment. A timeout was performed prior to the initiation of the procedure. Initial ultrasound scanning demonstrates a large amount of ascites within the right lower abdominal quadrant. The right lower abdomen was prepped and draped in the usual sterile fashion. 1% lidocaine was used for local anesthesia. Following this, a 19 gauge, 10-cm, Yueh catheter was introduced. An ultrasound image was saved for documentation purposes. The paracentesis was performed. The catheter was removed and a dressing was applied. The patient tolerated the procedure well without immediate post procedural complication. FINDINGS: A total of approximately 5 liters of yellow fluid was removed. IMPRESSION: Successful ultrasound-guided therapeutic paracentesis yielding 5 liters of peritoneal fluid. Read by: Rowe Robert, PA-C Electronically Signed   By: Ruthann Cancer MD   On: 03/19/2020 12:35   US Paracentesis  Result Date: 03/07/2020 INDICATION: Patient with a history of pancreatic cancer and recurrent large volume ascites. Interventional radiology asked to perform a therapeutic and diagnostic paracentesis. EXAM: ULTRASOUND GUIDED  PARACENTESIS MEDICATIONS: 1% lidocaine 10 mL COMPLICATIONS: None immediate. PROCEDURE: Informed written  consent was obtained from the  patient after a discussion of the risks, benefits and alternatives to treatment. A timeout was performed prior to the initiation of the procedure. Initial ultrasound scanning demonstrates a large amount of ascites within the right lower abdominal quadrant. The right lower abdomen was prepped and draped in the usual sterile fashion. 1% lidocaine was used for local anesthesia. Following this, a 19 gauge, 7-cm, Yueh catheter was introduced. An ultrasound image was saved for documentation purposes. The paracentesis was performed. The catheter was inadvertently removed midway through the procedure. Ultrasound imaging demonstrated additional fluid. The area was prepped and draped again in the usual sterile fashion and another Teressa Lower was reinserted. The remainder of the fluid was drained without further complication. The catheter was removed and a dressing was applied. The patient tolerated the procedure well without immediate post procedural complication. FINDINGS: A total of approximately 3.4 L of clear yellow fluid was removed. Samples were sent to the laboratory as requested by the clinical team. IMPRESSION: Successful ultrasound-guided paracentesis yielding 3.4 liters of peritoneal fluid. Read by: Soyla Dryer, NP Electronically Signed   By: Lucrezia Europe M.D.   On: 03/07/2020 13:19   IR Paracentesis  Result Date: 03/30/2020 INDICATION: Patient with a history of colon and pancreatic cancer with recurrent ascites. Interventional radiology asked to perform a therapeutic paracentesis up to 5 L. EXAM: ULTRASOUND GUIDED PARACENTESIS MEDICATIONS: 1% lidocaine 10 mL COMPLICATIONS: None immediate. PROCEDURE: Informed written consent was obtained from the patient after a discussion of the risks, benefits and alternatives to treatment. A timeout was performed prior to the initiation of the procedure. Initial ultrasound scanning demonstrates a large amount of ascites within the right lower abdominal quadrant. The right lower  abdomen was prepped and draped in the usual sterile fashion. 1% lidocaine was used for local anesthesia. Following this, a 19 gauge, 7-cm, Yueh catheter was introduced. An ultrasound image was saved for documentation purposes. The paracentesis was performed. The catheter was removed and a dressing was applied. The patient tolerated the procedure well without immediate post procedural complication. FINDINGS: A total of approximately 4 L of hazy yellow fluid was removed. IMPRESSION: Successful ultrasound-guided paracentesis yielding 4 liters of peritoneal fluid. Read by: Soyla Dryer, NP Electronically Signed   By: Aletta Edouard M.D.   On: 03/30/2020 10:17    ASSESSMENT AND PLAN: 1. 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 Well-differentiated adenocarcinoma, 0/13 lymph nodes positive, MSI-stable, no loss of mismatch repair protein expression ? Tumor involving the rectosigmoid junction ? Sigmoidoscopy 09/18/2017-distal sigmoid colon tumor beginning between 15 and 20 cm from the dentate line, completely obstructing ? CT abdomen/pelvis 09/14/2017-sigmoid thickening no evidence of metastatic disease ? 2 mm right upper lobe nodule on CT 08/04/2017 ? Colonoscopy 02/07/2018-polyps removed from the cecum, ascending colon, transverse colon, descending colon, rectosigmoid colon and rectum (tubular adenomas)  2. Right lung pneumonia/empyema March 2019  Right VATS, drainage of empyema, and decortication of the right lower lobe 08/31/2017  3.CVA in 2010  4.Rectal and active sigmoid polyps noted on flexible sigmoidoscopy 09/18/2017  5.Pancreas tail mass/right liver lesions and 4 mm splenic lesion on CT abdomen/pelvis 05/27/2018  MRI abdomen 07/27/2018-right liver lesions, rim-enhancing lesion in the pancreas tail  Ultrasound-guided biopsy of a right liver lesion 08/14/2018-metastatic adenocarcinoma,  cytokeratin 7+, CDX-2 weak positive, cytokeratin 20 negative, consistent with metastatic pancreas cancer; MSI stable; tumor mutational burden 0  CT abdomen/pelvis 09/26/2018-increased size of previously noted liver lesions,  new right liver lesion, 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  Cycle 6 gemcitabine/Abraxane 12/20/2018  Cycle 7 gemcitabine/Abraxane 01/03/2019  Cycle 8 gemcitabine/Abraxane 01/17/2019  Cycle 9 gemcitabine/Abraxane 01/31/2019  Cycle 10 gemcitabine/Abraxane 02/13/2019  Cycle 11 gemcitabine/Abraxane 02/28/2019 (Abraxane held secondary to neuropathy symptoms)  Cycle 12 gemcitabine/Abraxane 03/14/2019 (Abraxane held due to persistent neuropathy symptoms)  Cycle 13 gemcitabine/Abraxane 03/28/2019 (Abraxane held due to persistent neuropathy symptoms)  Cycle 14 gemcitabine/Abraxane 04/11/2019 (Abraxane held due to persistent neuropathy symptoms)  CT abdomen/pelvis 04/28/2019-decrease in size of pancreatic mass. Decrease in size of hepatic metastatic foci.   Cycle 15 gemcitabine12/05/2018(Abraxane held due to neuropathy symptoms)  Cycle 16 gemcitabine 05/16/2019 (Abraxane held due to neuropathy symptoms)  Cycle 17 gemcitabine 05/31/2019 (Abraxane on hold due to neuropathy symptoms)  Cycle 18 gemcitabine 06/13/2019 (Abraxane held)  Cycle 19 gemcitabine 06/27/2019 (Abraxane held)  Cycle 20 gemcitabine 07/11/2019 (Abraxane held)  Cycle 21 gemcitabine 07/25/2019 (Abraxane held)  CTabdomen/pelvis 08/05/2019-stable small liver lesions, stable pancreas mass, no evidence of disease progression  Cycle 22 gemcitabine  08/08/2019 (Abraxane held)  Cycle 23 gemcitabine 08/22/2019 (Abraxane held)  Cycle 24 gemcitabine 09/05/2019 (Abraxane held)  Cycle 25 gemcitabine 09/19/2019 (Abraxane held)  CT abdomen/pelvis 09/30/2019-no change in pancreas mass, slight enlargement of right liver metastases, largest measuring 1 cm, no new lesions  Cycle 26 gemcitabine/Abraxane 10/03/2019-Abraxane resumed at a dose of 100 mg/m  Cycle 27 gemcitabine/Abraxane 10/17/2019  Cycle 28 gemcitabine/Abraxane 10/31/2019  Cycle 29 gemcitabine/Abraxane 11/14/2019  Cycle 30 gemcitabine/Abraxane 11/28/2019  Cycle 31 gemcitabine/Abraxane 12/12/2019  CT abdomen/pelvis 12/25/2019-stable right hepatic lesion, no new liver lesions, stable pancreas tail mass, tiny bilateral lung base nodules, new compared to a chest CT 09/25/2017 and some have progressed compared to a's CT abdomen 08/05/2019  Cycle 32 gemcitabine/Abraxane 01/02/2020 (chemotherapy dose reduced due to neutropenia)  Cycle 33 gemcitabine/Abraxane 01/23/2020  Cycle 34 gemcitabine 02/13/2020 (Abraxane held secondary to neuropathy)  CT abdomen/pelvis 03/03/2020-no significant change in the appearance of tiny bilateral lung nodules at the lower lung zones. Previously characterized subcapsular metastatic liver lesion not seen on current study. No new focal liver abnormality. No biliary ductal dilatation. Index lesion distal tail of pancreas smaller. Interval development of large volume ascites within the abdomen and pelvis. No discrete peritoneal nodule. Portal vein patent. Esophageal and gastric varices identified similar to prior exam.  Gemcitabine every 2 weeks resumed 03/25/2020  CT abdomen/pelvis 04/04/2020-large amount of abdominopelvic ascites, slight decreased size in the pancreatic tail lesion, no definite hepatic lesion identified, stable bilateral tiny pulmonary nodules  6.Port-A-Cath placement by Dr. 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 Hospitalization 03/03/2020 with weakness, marked abdominal distention   CT abdomen/pelvis 03/03/2020-no significant change in the appearance of tiny bilateral lung nodules at the lower lung zones. Previously characterized subcapsular metastatic liver lesion not seen on current study. No new focal liver abnormality. No biliary ductal dilatation. Index lesion distal tail of pancreas smaller. Interval development of large volume ascites within the abdomen and pelvis. No discrete peritoneal nodule. Portal vein patent. Esophageal and gastric varices identified similar to prior exam.  Brain CT 03/03/2020-negative  Paracentesis 03/03/2020-5 L of fluid removed. Cytology negative.  Paracentesis 03/07/2020-3.4  L of fluid removed. Reactive mesothelial cells.  10. 1 set of blood cultures from the Port-A-Cath positive for staph epidermidis-treated with course of vancomycin 11. Mild thrombocytopenia-likely secondary to chemotherapy 12.  Hospitalization 11/7/721-nausea, vomiting, abdominal distention, AKI, altered mental status  Alex [name] is now admitted with nausea, vomiting, ascites, AKI and has developed altered mental status.  CT images have been reviewed and do not show definitive progression of his pancreatic cancer.  Fluid from paracentesis has been sent for cytology and is currently pending.  We will follow up on this.  Suspect abdominal distention and ascite due to cirrhosis/portal hypertension, and altered mental status are due to hepatic encephalopathy.  The patient has been seen by GI but since family indicated that they wanted to transition to comfort measures, they remain on standby.  Recommend reaching back out to GI pending goals of care discussion.  Recommendations: 1.  Continue lactulose and continue to monitor ammonia level 2.  Consider reaching back out to GI for ongoing management of his ?hepatic  encephalopathy and ascites 3.  We will follow up on cytology from paracentesis 4.  Plan for transition to comfort measures noted, please call oncology as needed    LOS: 2 days   Mikey Bussing, DNP, AGPCNP-BC, AOCNP 04/06/20   Alex [name] is admitted with progressive ascites and altered mental status.  He appears to have hepatic encephalopathy.  I suspect he has underlying cirrhosis and portal hypertension.  There is no clinical or radiologic evidence of progressive pancreas cancer.  We will follow up on peritoneal fluid cytology.  Alex [name] is not a candidate for further treatment of the pancreas cancer unless his mental status improves.  The plan for transition to comfort care is noted.

## 2020-04-07 DIAGNOSIS — N179 Acute kidney failure, unspecified: Secondary | ICD-10-CM | POA: Diagnosis not present

## 2020-04-07 DIAGNOSIS — Z7189 Other specified counseling: Secondary | ICD-10-CM | POA: Diagnosis not present

## 2020-04-07 DIAGNOSIS — C259 Malignant neoplasm of pancreas, unspecified: Secondary | ICD-10-CM | POA: Diagnosis not present

## 2020-04-07 DIAGNOSIS — Z515 Encounter for palliative care: Secondary | ICD-10-CM | POA: Diagnosis not present

## 2020-04-07 DIAGNOSIS — R188 Other ascites: Secondary | ICD-10-CM | POA: Diagnosis not present

## 2020-04-07 DIAGNOSIS — K7290 Hepatic failure, unspecified without coma: Secondary | ICD-10-CM | POA: Diagnosis not present

## 2020-04-07 LAB — COMPREHENSIVE METABOLIC PANEL
ALT: 20 U/L (ref 0–44)
AST: 46 U/L — ABNORMAL HIGH (ref 15–41)
Albumin: 3 g/dL — ABNORMAL LOW (ref 3.5–5.0)
Alkaline Phosphatase: 78 U/L (ref 38–126)
Anion gap: 6 (ref 5–15)
BUN: 65 mg/dL — ABNORMAL HIGH (ref 8–23)
CO2: 26 mmol/L (ref 22–32)
Calcium: 7.8 mg/dL — ABNORMAL LOW (ref 8.9–10.3)
Chloride: 107 mmol/L (ref 98–111)
Creatinine, Ser: 1.92 mg/dL — ABNORMAL HIGH (ref 0.61–1.24)
GFR, Estimated: 37 mL/min — ABNORMAL LOW (ref >60)
Glucose, Bld: 84 mg/dL (ref 70–99)
Potassium: 3.7 mmol/L (ref 3.5–5.1)
Sodium: 139 mmol/L (ref 135–145)
Total Bilirubin: 2.4 mg/dL — ABNORMAL HIGH (ref 0.3–1.2)
Total Protein: 5.3 g/dL — ABNORMAL LOW (ref 6.5–8.1)

## 2020-04-07 LAB — CBC
HCT: 28.4 % — ABNORMAL LOW (ref 39.0–52.0)
Hemoglobin: 9.5 g/dL — ABNORMAL LOW (ref 13.0–17.0)
MCH: 31.9 pg (ref 26.0–34.0)
MCHC: 33.5 g/dL (ref 30.0–36.0)
MCV: 95.3 fL (ref 80.0–100.0)
Platelets: 100 10*3/uL — ABNORMAL LOW (ref 150–400)
RBC: 2.98 MIL/uL — ABNORMAL LOW (ref 4.22–5.81)
RDW: 19.1 % — ABNORMAL HIGH (ref 11.5–15.5)
WBC: 8.1 10*3/uL (ref 4.0–10.5)
nRBC: 0 % (ref 0.0–0.2)

## 2020-04-07 LAB — AMMONIA: Ammonia: 31 umol/L (ref 9–35)

## 2020-04-07 MED ORDER — GLYCOPYRROLATE 0.2 MG/ML IJ SOLN
0.2000 mg | INTRAMUSCULAR | Status: DC | PRN
  Filled 2020-04-07: qty 1

## 2020-04-07 MED ORDER — LORAZEPAM 1 MG PO TABS: 1.0000 mg | ORAL_TABLET | ORAL | Status: DC | PRN

## 2020-04-07 MED ORDER — HALOPERIDOL LACTATE 5 MG/ML IJ SOLN
0.5000 mg | INTRAMUSCULAR | Status: DC | PRN
  Administered 2020-04-07 – 2020-04-09 (×3): 0.5 mg via INTRAVENOUS
  Filled 2020-04-07 (×3): qty 1

## 2020-04-07 MED ORDER — GLYCOPYRROLATE 1 MG PO TABS
1.0000 mg | ORAL_TABLET | ORAL | Status: DC | PRN
  Filled 2020-04-07: qty 1

## 2020-04-07 MED ORDER — ACETAMINOPHEN 325 MG PO TABS: 650.0000 mg | ORAL_TABLET | Freq: Four times a day (QID) | ORAL | Status: DC | PRN

## 2020-04-07 MED ORDER — SODIUM CHLORIDE 0.9 % IV SOLN
12.5000 mg | Freq: Four times a day (QID) | INTRAVENOUS | Status: DC | PRN
  Filled 2020-04-07: qty 0.5

## 2020-04-07 MED ORDER — ACETAMINOPHEN 650 MG RE SUPP: 650.0000 mg | Freq: Four times a day (QID) | RECTAL | Status: DC | PRN

## 2020-04-07 MED ORDER — POLYVINYL ALCOHOL 1.4 % OP SOLN
1.0000 [drp] | Freq: Four times a day (QID) | OPHTHALMIC | Status: DC | PRN
  Filled 2020-04-07: qty 15

## 2020-04-07 MED ORDER — OXYBUTYNIN CHLORIDE 5 MG PO TABS: 2.5000 mg | ORAL_TABLET | Freq: Four times a day (QID) | ORAL | Status: DC | PRN

## 2020-04-07 MED ORDER — LORAZEPAM 2 MG/ML PO CONC: 1.0000 mg | ORAL | Status: DC | PRN

## 2020-04-07 MED ORDER — LORAZEPAM 2 MG/ML IJ SOLN
1.0000 mg | INTRAMUSCULAR | Status: DC | PRN
  Administered 2020-04-07 – 2020-04-09 (×6): 1 mg via INTRAVENOUS
  Filled 2020-04-07 (×6): qty 1

## 2020-04-07 MED ORDER — HALOPERIDOL 0.5 MG PO TABS
0.5000 mg | ORAL_TABLET | ORAL | Status: DC | PRN
  Filled 2020-04-07: qty 1

## 2020-04-07 MED ORDER — HALOPERIDOL LACTATE 2 MG/ML PO CONC
0.5000 mg | ORAL | Status: DC | PRN
  Filled 2020-04-07: qty 0.3

## 2020-04-07 MED ORDER — ONDANSETRON 4 MG PO TBDP: 4.0000 mg | ORAL_TABLET | Freq: Four times a day (QID) | ORAL | Status: DC | PRN

## 2020-04-07 MED ORDER — TEMAZEPAM 15 MG PO CAPS: 15.0000 mg | ORAL_CAPSULE | Freq: Every evening | ORAL | Status: DC | PRN

## 2020-04-07 MED ORDER — NYSTATIN 100000 UNIT/GM EX POWD
Freq: Three times a day (TID) | CUTANEOUS | Status: DC | PRN
  Filled 2020-04-07: qty 15

## 2020-04-07 MED ORDER — BIOTENE DRY MOUTH MT LIQD: 15.0000 mL | OROMUCOSAL | Status: DC | PRN

## 2020-04-07 MED ORDER — GLYCOPYRROLATE 0.2 MG/ML IJ SOLN
0.2000 mg | INTRAMUSCULAR | Status: DC | PRN
  Administered 2020-04-07 – 2020-04-09 (×7): 0.2 mg via INTRAVENOUS
  Filled 2020-04-07 (×10): qty 1

## 2020-04-07 MED ORDER — ONDANSETRON HCL 4 MG/2ML IJ SOLN: 4.0000 mg | Freq: Four times a day (QID) | INTRAMUSCULAR | Status: DC | PRN

## 2020-04-07 MED ORDER — DIPHENHYDRAMINE HCL 50 MG/ML IJ SOLN
12.5000 mg | INTRAMUSCULAR | Status: DC | PRN
  Administered 2020-04-09 (×4): 12.5 mg via INTRAVENOUS
  Filled 2020-04-07 (×4): qty 1

## 2020-04-07 MED ORDER — MAGIC MOUTHWASH W/LIDOCAINE
15.0000 mL | Freq: Four times a day (QID) | ORAL | Status: DC | PRN
  Filled 2020-04-07: qty 15

## 2020-04-07 NOTE — Progress Notes (Signed)
Chaplain engaged in initial visit with Tyland, his daughter and son-in-law.  Chaplain offered support and explained her role.  During visit, Shylo's daughter shared his current condition and health journey.  For her, his recent diagnosis feels sudden because he came into the hospital with what she and her brother thought was only dehydration.  They have had to come together in a short period of time and make some integral decisions on behalf of their dad.  Daughter wants to make sure she is making the best decision and has worked hard to understand what each doctor of the different specialities that are handling her father's care are verbalizing to her about her dad's cancer.  She expressed that it has been hard to hear multiple things that sometimes sound different from the care team.  Chaplain affirmed how hard it can be the synthesize all of that information and empowered her to ask questions and have meetings with them at one time if she needs that.  Chaplain asked daughter to describe her father.  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 Her and her husband could verbalize that Javoni would not want to be confined to a bed or chair. They are thinking deeply about quality of life for him. They have decided collectively that hospice care is best for him so that he will be comfortable.  Daughter described this process and time as being "hard."  Chaplain affirmed how hard it is to make decisions that we do not want to make.  Chaplain also worked to affirm Naval architect and love they have showered upon their dad and his care.    Chaplain and family discussed hospice and the ways in which Treyvone will be made more comfortable and receive more attentive care.  Chaplain also expressed the time they will have to continue to honor [place] and come to terms with the next stage of his life.  Chaplain  offered the ministries of support, listening, prayer and presence with family.   Chaplain is available to provide support.     04/07/20 1000  Clinical Encounter Type  Visited With Patient and family together  Visit Type Initial;Social support;Spiritual support  Referral From Palliative care team  Consult/Referral To Chaplain  Spiritual Encounters  Spiritual Needs Grief support;Emotional;Prayer  Stress Factors  Patient Stress Factors Health changes;Major life changes;Loss of control  Family Stress Factors Major life changes;Health changes

## 2020-04-07 NOTE — Progress Notes (Addendum)
HEMATOLOGY-ONCOLOGY PROGRESS NOTE  SUBJECTIVE: Not answering questions.  Oncology History  Malignant tumor of sigmoid colon [facility])   Initial Diagnosis   Malignant tumor of sigmoid colon ([facility])   08/26/2018 Genetic Testing   Negative genetic testing.  The Hereditary Gene Panel offered by [facility] includes sequencing and/or deletion duplication testing of the following 48 genes: APC, ATM, AXIN2, BARD1, BLM, BMPR1A, BRCA1, BRCA2, BRIP1, BUB1B, CDH1, CDK4, CDKN2A (p14ARF), CDKN2A (p16INK4a), CEP57, CHEK2, CTNNA1, DICER1, ENG, EPCAM (Deletion/duplication testing only), FLCN, GALANT12, GREM1 (promoter region deletion/duplication testing only), KIT, MEN1, MLH1, MLH3, MSH2, MSH3, MSH6, MUTYH, NBN, NF1, NHTL1, PALB2, PDGFRA, PMS2, POLD1, POLE, PTEN, RAD50, RAD51C, RAD51D, RNF43, SDHB, SDHC, SDHD, SMAD4, SMARCA4. STK11, TP53, TSC1, TSC2, and VHL.  The following genes were evaluated for sequence changes only: SDHA and HOXB13 c.251G>A variant only. The report date is August 26, 2018.   Cancer of pancreas, tail ([place])  10/04/2018 Initial Diagnosis   Cancer of pancreas, tail ([place])   10/11/2018 -  Chemotherapy   The patient had PACLitaxel-protein bound (ABRAXANE) chemo infusion 250 mg, 125 mg/m2 = 250 mg (100 % of original dose 125 mg/m2), Intravenous,  Once, 9 of 9 cycles Dose modification: 125 mg/m2 (original dose 125 mg/m2, Cycle 2), 100 mg/m2 (original dose 100 mg/m2, Cycle 13), 80 mg/m2 (original dose 100 mg/m2, Cycle 16, Reason: Provider Judgment) Administration: 250 mg (11/08/2018), 250 mg (11/22/2018), 250 mg (12/06/2018), 250 mg (12/20/2018), 250 mg (01/03/2019), 250 mg (01/17/2019), 250 mg (01/31/2019), 250 mg (02/13/2019), 200 mg (10/03/2019), 200 mg (10/17/2019), 200 mg (10/31/2019), 200 mg (11/14/2019), 200 mg (11/28/2019), 200 mg (12/12/2019), 175 mg (01/02/2020), 175 mg (01/23/2020) gemcitabine (GEMZAR) 2,000 mg in sodium chloride 0.9 % 250 mL chemo infusion, 2,000 mg, Intravenous,  Once, 1 of 1 cycle Administration: 2,000 mg  (12/06/2018) gemcitabine (GEMZAR) 2,090 mg in sodium chloride 0.9 % 250 mL chemo infusion, 1,000 mg/m2 = 2,090 mg, Intravenous,  Once, 18 of 18 cycles Dose modification: 800 mg/m2 (original dose 1,000 mg/m2, Cycle 16, Reason: Provider Judgment), 800 mg/m2 (original dose 1,000 mg/m2, Cycle 17, Reason: Provider Judgment) Administration: 2,090 mg (10/11/2018), 2,000 mg (10/25/2018), 2,000 mg (11/08/2018), 2,000 mg (11/22/2018), 2,000 mg (12/20/2018), 2,000 mg (01/03/2019), 2,000 mg (01/17/2019), 2,000 mg (01/31/2019), 2,000 mg (02/13/2019), 2,000 mg (02/28/2019), 2,000 mg (03/14/2019), 2,000 mg (03/28/2019), 2,000 mg (04/11/2019), 2,000 mg (04/29/2019), 2,000 mg (05/16/2019), 2,000 mg (05/31/2019), 2,000 mg (06/13/2019), 2,000 mg (06/27/2019), 2,000 mg (07/11/2019), 2,000 mg (07/25/2019), 2,000 mg (08/08/2019), 2,000 mg (08/22/2019), 2,000 mg (09/05/2019), 2,000 mg (09/19/2019), 2,000 mg (10/03/2019), 2,000 mg (10/17/2019), 2,000 mg (10/31/2019), 2,000 mg (11/14/2019), 2,000 mg (11/28/2019), 2,000 mg (12/12/2019), 1,634 mg (01/02/2020), 1,634 mg (01/23/2020), 1,634 mg (02/13/2020), 1,634 mg (03/25/2020) PACLitaxel-protein bound (ABRAXANE) chemo infusion 250 mg, 125 mg/m2 = 250 mg (100 % of original dose 125 mg/m2), Intravenous, Once, 1 of 1 cycle Dose modification: 100 mg/m2 (original dose 125 mg/m2, Cycle 1, Reason: Provider Judgment), 125 mg/m2 (original dose 125 mg/m2, Cycle 1, Reason: Provider Judgment) Administration: 250 mg (10/11/2018), 250 mg (10/25/2018)  for chemotherapy treatment.     PHYSICAL EXAMINATION:  Vitals:   04/06/20 0538 04/06/20 1450  BP: 127/77 122/67  Pulse: 98 90  Resp: 20 18  Temp: 97.8 F (36.6 C)   SpO2: 99%    Filed Weights   04/04/20 1428  Weight: 210 lb (95.3 kg)    Intake/Output from previous day: 11/09 0701 - 11/10 0700 In: -  Out: 1850 [Urine:1150; Stool:700]  LUNGS: Rhonchi and wheezes anteriorly.  HEART: regular rate &  rhythm ABDOMEN: Distended, nontender, left lower quadrant colostomy.   NEURO: Opens eyes, does not answer questions of follow commands.  LABORATORY DATA:  I have reviewed the data as listed CMP Latest Ref Rng & Units 04/07/2020 04/06/2020 04/05/2020  Glucose 70 - 99 mg/dL 84 104(H) 134(H)  BUN 8 - 23 mg/dL 65(H) 65(H) 55(H)  Creatinine 0.61 - 1.24 mg/dL 1.92(H) 2.52(H) 2.73(H)  Sodium 135 - 145 mmol/L 139 136 133(L)  Potassium 3.5 - 5.1 mmol/L 3.7 4.3 5.0  Chloride 98 - 111 mmol/L 107 101 96(L)  CO2 22 - 32 mmol/L 26 22 17(L)  Calcium 8.9 - 10.3 mg/dL 7.8(L) 8.0(L) 8.0(L)  Total Protein 6.5 - 8.1 g/dL 5.3(L) 6.1(L) 6.0(L)  Total Bilirubin 0.3 - 1.2 mg/dL 2.4(H) 3.3(H) 3.2(H)  Alkaline Phos 38 - 126 U/L 78 102 125  AST 15 - 41 U/L 46(H) 55(H) 62(H)  ALT 0 - 44 U/L 20 23 <5    Lab Results  Component Value Date   WBC 8.1 04/07/2020   HGB 9.5 (L) 04/07/2020   HCT 28.4 (L) 04/07/2020   MCV 95.3 04/07/2020   PLT 100 (L) 04/07/2020   NEUTROABS 9.6 (H) 04/06/2020    CT ABDOMEN PELVIS WO CONTRAST  Result Date: 04/04/2020 CLINICAL DATA:  Vomiting history colon and pancreatic cancer EXAM: CT ABDOMEN AND PELVIS WITHOUT CONTRAST TECHNIQUE: Multidetector CT imaging of the abdomen and pelvis was performed following the standard protocol without IV contrast. COMPARISON:  March 03, 2020 FINDINGS: Lower chest: The visualized heart size within normal limits. No pericardial fluid/thickening. No hiatal hernia. Streaky atelectasis seen at both lung bases. There is tiny scattered sub 5 mm pulmonary nodules at both lung bases. Hepatobiliary: Although limited due to the lack of intravenous contrast, normal in appearance without gross focal abnormality. No evidence of calcified gallstones or biliary ductal dilatation. Pancreas: Within the tail of the pancreas there is slight interval decrease in the hypoechoic lesion with peripheral calcifications now measuring 1.3 x 1.1 cm, previously 2.0 x 1.6 cm. Spleen: Normal in size. Although limited due to the lack of intravenous  contrast, normal in appearance. Adrenals/Urinary Tract: Both adrenal glands appear normal. The kidneys and collecting system appear normal without evidence of urinary tract calculus or hydronephrosis. Bladder is unremarkable. Stomach/Bowel: The stomach, small bowel, are normal in appearance. Again noted is a left anterior abdominal colostomy. Remainder of the colon is unremarkable. Scattered colonic diverticula are noted. Vascular/Lymphatic: There are no enlarged abdominal or pelvic lymph nodes. Scattered aortic atherosclerosis seen. 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 Reproductive: The prostate is unremarkable. Other: A large amount of abdominopelvic ascites is noted. Small fat containing inguinal hernias are seen. Musculoskeletal: No acute or significant osseous findings. IMPRESSION: 1. Large amount of abdominopelvic ascites. 2. Slight interval decreased size in the pancreatic tail lesion, now measuring 1.3 x 1.1 cm. 3. No definite hepatic lesion identified, however limited due to lack of intravenous contrast. 4. Stable bilateral tiny pulmonary nodules. 5.  Aortic Atherosclerosis (ICD10-I70.0). Electronically Signed   By: Prudencio Pair M.D.   On: 04/04/2020 19:31   CT Head Wo Contrast  Result Date: 04/04/2020 CLINICAL DATA:  Dizziness, vomiting, colon cancer EXAM: CT HEAD WITHOUT CONTRAST TECHNIQUE: Contiguous axial images were obtained from the base of the skull through the vertex without intravenous contrast. COMPARISON:  03/03/2020 FINDINGS: Brain: Chronic encephalomalacia within the left temporal region is stable. No acute infarct or hemorrhage. Lateral ventricles and midline structures are unremarkable. No acute extra-axial fluid collections. No mass effect. Vascular: No  hyperdense vessel or unexpected calcification. Skull: Normal. Negative for fracture or focal lesion. Sinuses/Orbits: No acute finding. Other: None. IMPRESSION: 1. Stable exam, no acute process. Electronically Signed   By: Randa Ngo  M.D.   On: 04/04/2020 16:11   US Paracentesis  Result Date: 04/05/2020 INDICATION: Patient with history of colon cancer, pancreatic cancer, encephalopathy, recurrent ascites. Request received for diagnostic and therapeutic paracentesis. EXAM: ULTRASOUND GUIDED DIAGNOSTIC AND THERAPEUTIC PARACENTESIS MEDICATIONS: 1% lidocaine to skin and subcutaneous tissue COMPLICATIONS: None immediate. PROCEDURE: Informed written consent was obtained from the patient's spouse/son after a discussion of the risks, benefits and alternatives to treatment. A timeout was performed prior to the initiation of the procedure. Initial ultrasound scanning demonstrates a large amount of ascites within the right lower abdominal quadrant. The right lower abdomen was prepped and draped in the usual sterile fashion. 1% lidocaine was used for local anesthesia. Following this, a 19 gauge, 10 cm, Yueh catheter was introduced. An ultrasound image was saved for documentation purposes. The paracentesis was performed. The catheter was removed and a dressing was applied. The patient tolerated the procedure well without immediate post procedural complication. FINDINGS: A total of approximately 6.8 liters of hazy, yellow fluid was removed. Samples were sent to the laboratory as requested by the clinical team. IMPRESSION: Successful ultrasound-guided diagnostic and therapeutic paracentesis yielding 6.8 liters of peritoneal fluid. Read by: Rowe Robert, PA-C Electronically Signed   By: Jacqulynn Cadet M.D.   On: 04/05/2020 12:45   US Paracentesis  Result Date: 03/19/2020 INDICATION: Patient with history of colon and pancreatic cancer, recurrent ascites. Request made for therapeutic paracentesis up to 5 liters. EXAM: ULTRASOUND GUIDED THERAPEUTIC  PARACENTESIS MEDICATIONS: 1% lidocaine to skin and subcutaneous tissue COMPLICATIONS: None immediate. PROCEDURE: Informed written consent was obtained from the patient after a discussion of the risks,  benefits and alternatives to treatment. A timeout was performed prior to the initiation of the procedure. Initial ultrasound scanning demonstrates a large amount of ascites within the right lower abdominal quadrant. The right lower abdomen was prepped and draped in the usual sterile fashion. 1% lidocaine was used for local anesthesia. Following this, a 19 gauge, 10-cm, Yueh catheter was introduced. An ultrasound image was saved for documentation purposes. The paracentesis was performed. The catheter was removed and a dressing was applied. The patient tolerated the procedure well without immediate post procedural complication. FINDINGS: A total of approximately 5 liters of yellow fluid was removed. IMPRESSION: Successful ultrasound-guided therapeutic paracentesis yielding 5 liters of peritoneal fluid. Read by: Rowe Robert, PA-C Electronically Signed   By: Ruthann Cancer MD   On: 03/19/2020 12:35   IR Paracentesis  Result Date: 03/30/2020 INDICATION: Patient with a history of colon and pancreatic cancer with recurrent ascites. Interventional radiology asked to perform a therapeutic paracentesis up to 5 L. EXAM: ULTRASOUND GUIDED PARACENTESIS MEDICATIONS: 1% lidocaine 10 mL COMPLICATIONS: None immediate. PROCEDURE: Informed written consent was obtained from the patient after a discussion of the risks, benefits and alternatives to treatment. A timeout was performed prior to the initiation of the procedure. Initial ultrasound scanning demonstrates a large amount of ascites within the right lower abdominal quadrant. The right lower abdomen was prepped and draped in the usual sterile fashion. 1% lidocaine was used for local anesthesia. Following this, a 19 gauge, 7-cm, Yueh catheter was introduced. An ultrasound image was saved for documentation purposes. The paracentesis was performed. The catheter was removed and a dressing was applied. The patient tolerated the procedure well without immediate post  procedural  complication. FINDINGS: A total of approximately 4 L of hazy yellow fluid was removed. IMPRESSION: Successful ultrasound-guided paracentesis yielding 4 liters of peritoneal fluid. Read by: Soyla Dryer, NP Electronically Signed   By: Aletta Edouard M.D.   On: 03/30/2020 10:17    ASSESSMENT AND PLAN: 1. 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 Well-differentiated adenocarcinoma, 0/13 lymph nodes positive, MSI-stable, no loss of mismatch repair protein expression ? Tumor involving the rectosigmoid junction ? Sigmoidoscopy 09/18/2017-distal sigmoid colon tumor beginning between 15 and 20 cm from the dentate line, completely obstructing ? CT abdomen/pelvis 09/14/2017-sigmoid thickening no evidence of metastatic disease ? 2 mm right upper lobe nodule on CT 08/04/2017 ? Colonoscopy 02/07/2018-polyps removed from the cecum, ascending colon, transverse colon, descending colon, rectosigmoid colon and rectum (tubular adenomas)  2. Right lung pneumonia/empyema March 2019  Right VATS, drainage of empyema, and decortication of the right lower lobe 08/31/2017  3.CVA in 2010  4.Rectal and active sigmoid polyps noted on flexible sigmoidoscopy 09/18/2017  5.Pancreas tail mass/right liver lesions and 4 mm splenic lesion on CT abdomen/pelvis 05/27/2018  MRI abdomen 07/27/2018-right liver lesions, rim-enhancing lesion in the pancreas tail  Ultrasound-guided biopsy of a right liver lesion 08/14/2018-metastatic adenocarcinoma, cytokeratin 7+, CDX-2 weak positive, cytokeratin 20 negative, consistent with metastatic pancreas cancer; MSI stable; tumor mutational burden 0  CT abdomen/pelvis 09/26/2018-increased size of previously noted liver lesions, new right liver lesion, increased size of pancreas mass with occlusion of the splenic vein  Cycle 1 gemcitabine/Abraxane 10/11/2018  Cycle 2 gemcitabine/Abraxane  10/25/2018  Cycle 3 gemcitabine/Abraxane 11/08/2018  Cycle 4 gemcitabine/Abraxane 11/22/2018  Cycle 5 gemcitabine/Abraxane 12/06/2018  CT 12/18/2018-overall improved with reduced size of the hepatic metastatic lesions, mildly reduced size of the pancreatic tail mass.  Cycle 6 gemcitabine/Abraxane 12/20/2018  Cycle 7 gemcitabine/Abraxane 01/03/2019  Cycle 8 gemcitabine/Abraxane 01/17/2019  Cycle 9 gemcitabine/Abraxane 01/31/2019  Cycle 10 gemcitabine/Abraxane 02/13/2019  Cycle 11 gemcitabine/Abraxane 02/28/2019 (Abraxane held secondary to neuropathy symptoms)  Cycle 12 gemcitabine/Abraxane 03/14/2019 (Abraxane held due to persistent neuropathy symptoms)  Cycle 13 gemcitabine/Abraxane 03/28/2019 (Abraxane held due to persistent neuropathy symptoms)  Cycle 14 gemcitabine/Abraxane 04/11/2019 (Abraxane held due to persistent neuropathy symptoms)  CT abdomen/pelvis 04/28/2019-decrease in size of pancreatic mass. Decrease in size of hepatic metastatic foci.   Cycle 15 gemcitabine12/05/2018(Abraxane held due to neuropathy symptoms)  Cycle 16 gemcitabine 05/16/2019 (Abraxane held due to neuropathy symptoms)  Cycle 17 gemcitabine 05/31/2019 (Abraxane on hold due to neuropathy symptoms)  Cycle 18 gemcitabine 06/13/2019 (Abraxane held)  Cycle 19 gemcitabine 06/27/2019 (Abraxane held)  Cycle 20 gemcitabine 07/11/2019 (Abraxane held)  Cycle 21 gemcitabine 07/25/2019 (Abraxane held)  CTabdomen/pelvis 08/05/2019-stable small liver lesions, stable pancreas mass, no evidence of disease progression  Cycle 22 gemcitabine 08/08/2019 (Abraxane held)  Cycle 23 gemcitabine 08/22/2019 (Abraxane held)  Cycle 24 gemcitabine 09/05/2019 (Abraxane held)  Cycle 25 gemcitabine 09/19/2019 (Abraxane held)  CT abdomen/pelvis 09/30/2019-no change in pancreas mass, slight enlargement of right liver metastases, largest measuring 1 cm, no new lesions  Cycle 26 gemcitabine/Abraxane 10/03/2019-Abraxane resumed at a dose of  100 mg/m  Cycle 27 gemcitabine/Abraxane 10/17/2019  Cycle 28 gemcitabine/Abraxane 10/31/2019  Cycle 29 gemcitabine/Abraxane 11/14/2019  Cycle 30 gemcitabine/Abraxane 11/28/2019  Cycle 31 gemcitabine/Abraxane 12/12/2019  CT abdomen/pelvis 12/25/2019-stable right hepatic lesion, no new liver lesions, stable pancreas tail mass, tiny bilateral lung base nodules, new compared to a chest CT 09/25/2017 and some have progressed compared to a's CT abdomen 08/05/2019  Cycle 32 gemcitabine/Abraxane 01/02/2020 (  chemotherapy dose reduced due to neutropenia)  Cycle 33 gemcitabine/Abraxane 01/23/2020  Cycle 34 gemcitabine 02/13/2020 (Abraxane held secondary to neuropathy)  CT abdomen/pelvis 03/03/2020-no significant change in the appearance of tiny bilateral lung nodules at the lower lung zones. Previously characterized subcapsular metastatic liver lesion not seen on current study. No new focal liver abnormality. No biliary ductal dilatation. Index lesion distal tail of pancreas smaller. Interval development of large volume ascites within the abdomen and pelvis. No discrete peritoneal nodule. Portal vein patent. Esophageal and gastric varices identified similar to prior exam.  Gemcitabine every 2 weeks resumed 03/25/2020  CT abdomen/pelvis 04/04/2020-large amount of abdominopelvic ascites, slight decreased size in the pancreatic tail lesion, no definite hepatic lesion identified, stable bilateral tiny pulmonary nodules  6.Port-A-Cath placement by Dr. 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 Hospitalization 03/03/2020 with weakness, marked abdominal distention   CT abdomen/pelvis 03/03/2020-no significant change in the appearance of tiny bilateral lung nodules at the lower lung zones. Previously characterized subcapsular metastatic liver lesion not seen on current study. No new focal liver  abnormality. No biliary ductal dilatation. Index lesion distal tail of pancreas smaller. Interval development of large volume ascites within the abdomen and pelvis. No discrete peritoneal nodule. Portal vein patent. Esophageal and gastric varices identified similar to prior exam.  Brain CT 03/03/2020-negative  Paracentesis 03/03/2020-5 L of fluid removed. Cytology negative.  Paracentesis 03/07/2020-3.4 L of fluid removed. Reactive mesothelial cells.  10. 1 set of blood cultures from the Port-A-Cath positive for staph epidermidis-treated with course of vancomycin 11. Mild thrombocytopenia-likely secondary to chemotherapy 12.  Hospitalization 11/7/721-nausea, vomiting, abdominal distention, AKI, altered mental status  [name] continues to have an altered mental status. Ammonia level now normal. No evidence of progressive pancreas cancer. Cytology on ascites 04/05/2020 negative for malignant cells. He was seen by palliative care yesterday. Family considering comfort measures.     LOS: 3 days   Ned Card, AGPCNP-BC 04/07/20  [name] was interviewed and examined.  His daughter was at the bedside.  We discussed the diagnosis of pancreas cancer and his current clinical status.  He continues to have altered mental status, most likely secondary to hepatic encephalopathy.  No infection or progression of pancreas cancer has been identified.  The family understands he has an incurable malignancy.  They are hopeful he may improve, but understand he may continue to decline.  He appears to have developed respiratory distress, aspiration?.  I discussed the case with Dr. Grandville Silos and he is being followed by palliative care medicine.

## 2020-04-07 NOTE — Care Management Important Message (Signed)
Important Message  Patient Details IM Letter given to the Patient Name: Alex [name] MRN: [id number] Date of Birth: 04/12/1951   Medicare Important Message Given:  Yes     Kerin Salen 04/07/2020, 10:17 AM

## 2020-04-07 NOTE — Progress Notes (Signed)
PROGRESS NOTE    Alex [name]  GMW:[id number] DOB: Oct 23, 1950 DOA: 04/04/2020 PCP: Antonietta Jewel, MD    Chief Complaint  Patient presents with  . Dizziness    Brief Narrative:  HPI per Dr. 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 When asked, he states only approximately 3 episodes of vomiting at home prior to admission.  He also endorses worsening abdominal distention.  His last paracentesis was on 03/30/2020 with 4 L removed. He endorses urinating less than usual with darkening of color but cannot elaborate further. His ostomy bag output has remained at baseline with blend of solid and liquid stool.  ER work-up notable for: CT head unremarkable CT abdomen/pelvis: Large abdominal ascites, small decrease in pancreatic tail lesion.  Stable bilateral pulmonary nodules Labs: Na 131, K 4.9, Cl 96, Co2 21, Glucose 122, BUN 58, Creat 2.3, Alb 2.5, AST 58, ALT 23, AP 133, TB 2.9 WBC 11.1, Hgb 13.2, Hct 39.3, PLTC 130 Lipase 57  He has no known history of CKD prior to October 2021 with normal renal function seen in the past.  Due to his decreased urine output, increased creatinine, increased abdominal distention he is admitted for further treatment of AKI and obtaining paracentesis.   Assessment & Plan:   Principal Problem:   AKI (acute kidney injury) ([place]) Active Problems:   Hyponatremia   Ascites   Pancreatic cancer metastasized to liver (HCC)   Elevated LFTs   Hepatic encephalopathy (HCC)   Acute metabolic  encephalopathy   Metabolic acidosis  1 acute metabolic encephalopathy secondary to hepatic encephalopathy Likely secondary to hepatic encephalopathy as patient noted to have a elevated ammonia level on admission.  Patient with stage IV adenocarcinoma of the pancreas and history of stage II adenocarcinoma of the rectosigmoid status post partial colectomy with end colostomy..  Patient with mets to the liver.  Patient on admission noted also to be in acute kidney injury.  ABG done on admission with a pH of 7.46, PCO2 of 24, PO2 of 90, bicarb of 17.3, O2 sats of 96.9.  Ammonia level elevated at 177 and trending down currently at 80.  CT head negative for any acute abnormalities.  Patient noted to be confused and agitated early on this morning.  Patient received some pain medication currently resting comfortably. Patient not safe for oral intake.  Patient currently receiving lactulose enemas which we will continue and see if any clinical improvement over the next 24 hours..  Patient seen in consultation by GI who had recommended treating with lactulose, MiraLAX and Xifaxan for diagnosis and therapeutic benefit however would require NG tube administration however family per GI no declined NG tube placement and wanted to focus on comfort care.  Continue lactulose enemas.  Palliative care consulted have assessed the patient discussed with family and decision was made to transition to comfort measures with disposition to residential hospice home.  Follow.    2.  History of metastatic stage IV adenocarcinoma of the pancreas, history of stage II adenocarcinoma of the rectosigmoid status post partial colectomy with  end colostomy Patient diagnosed with pancreatic cancer May 27, 2018 being followed by oncology.  Patient currently on gemcitabine every 2 weeks with last dose 03/26/2019.  Patient declining with declining quality of life, recurrent hospitalizations and recurrent ascites.  Patient status post paracentesis  with 6.8 L removed.  Oncology notified and are following.  Discontinued IV fentanyl and placed on IV Dilaudid as needed for pain control.  Palliative care consulted and decision was made to transition to full comfort measures.  Oncology following and does not feel worsening progression of cancer at this time.  Patient however with poor oral intake.  Metabolic encephalopathy.  Deteriorating.  Oncology following.  3.  Acute kidney injury/metabolic acidosis ??  Etiology.  May be secondary to a prerenal azotemia versus secondary to hepatorenal syndrome in the setting of diuretics and spironolactone.  Last creatinine of 1.28 on 03/25/2020.  Patient noted to have presented with nausea vomiting and worsening abdominal distention.  Creatinine currently at 1.92.  CT abdomen and pelvis done on admission negative for hydronephrosis or urinary tract calculus.  Bladder unremarkable.  Patient status post ultrasound-guided paracentesis with 6.8 L removed.  Urinalysis not done on admission.  Patient with urine output of 1.150 L over the past 24 hours.  Patient s/p IV albumin.  Patient was on gentle hydration.  Avoid nephrotoxic medications.  Continue to hold HCTZ and spironolactone.  Palliative care consulted and are following.   4.  Hyponatremia Likely secondary to hypovolemic hyponatremia.  Status post ultrasound-guided paracentesis.  Sodium level currently at 139.  Status post gentle hydration.  Follow.    5.  Normocytic anemia Patient with no overt bleeding.  Hemoglobin stable at 9.5.  Palliative care following and decision has been made to transition to comfort measures.   6.  Ascites Questionable etiology.  Patient underwent paracentesis with cytology done 03/03/2020, 03/08/2019 which was negative for malignancy.  Status post ultrasound-guided paracentesis during this hospitalization with 6.8 L removed and cytology pending.  Gram stain and culture negative.  Continue lactulose enemas.  Supportive care.   7.   Obesity  8.  Prognosis Patient with history of stage II rectosigmoid adenocarcinoma status post partial colectomy with end colostomy, stage IV pancreatic cancer with mets to the liver, splenic lesion, bilateral lung base nodules.  Patient presenting with altered mental status.  Ammonia levels elevated.  Concern for hepatic encephalopathy.  Patient also noted to have ascites status post ultrasound-guided paracentesis.  Patient still confused.  Patient deteriorating.  Patient currently receiving lactulose enemas.  Patient seen in consultation by palliative care who discussed with family and decision was made to transition to full comfort measures with likely disposition of residential hospice home.  Patient currently being kept comfortable.  We will continue lactulose enemas for another 24 hours to see if any significant clinical improvement.  Palliative care following and appreciate input and recommendations.   DVT prophylaxis: SCDs Code Status: DNR Family Communication: Updated daughter at bedside.  Disposition:   Status is: Inpatient    Dispo: The patient is from: Home              Anticipated d/c is to: If continued deterioration likely residential hospice home.              Anticipated d/c date is: To be determined              Patient currently confused, agitation, likely hepatic encephalopathy, deteriorating, stage II rectosigmoid adenocarcinoma status post partial colectomy with end colostomy, stage IV  pancreatic cancer with mets to the liver, splenic lesion, bilateral lung base nodules.  Not stable for discharge.       Consultants:   Palliative care: Dr. Rowe Pavy  Oncology: Dr. Benay Spice  Gastroenterology: Dr. Tarri Glenn 04/05/2020  Procedures:  CT abdomen and pelvis 04/04/2020  CT head without contrast 04/04/2020  Ultrasound-guided paracentesis 04/05/2020 per IR Dr. Devra Dopp L of peritoneal fluid removed  Antimicrobials:   None   Subjective: Patient resting  comfortably.  Patient noted to be agitated early on this morning.  Patient received some pain medication and resting comfortably.  Daughter at bedside.    Objective: Vitals:   04/05/20 2155 04/06/20 0200 04/06/20 0538 04/06/20 1450  BP: 136/89 113/60 127/77 122/67  Pulse: 91 90 98 90  Resp: 20 20 20 18   Temp: 98.5 F (36.9 C) 97.6 F (36.4 C) 97.8 F (36.6 C)   TempSrc: Axillary Oral Axillary   SpO2: 98% 98% 99%   Weight:      Height:        Intake/Output Summary (Last 24 hours) at 04/07/2020 1310 Last data filed at 04/07/2020 1000 Gross per 24 hour  Intake --  Output 2050 ml  Net -2050 ml   Filed Weights   04/04/20 1428  Weight: 95.3 kg    Examination:  General exam: Resting comfortably. Respiratory system: CTA B.  No wheezes, no crackles, no rhonchi.  Normal respiratory effort.  Cardiovascular system: Regular rate and rhythm no murmurs rubs or gallops.  No JVD.  No lower extremity edema.  Gastrointestinal system: Abdomen is distended, hypoactive bowel sounds, soft, nontender to palpation.  Colostomy with stool noted. Central nervous system: Alert. No focal neurological deficits. Extremities: Symmetric 5 x 5 power. Skin: No rashes, lesions or ulcers Psychiatry: Judgement and insight appear poor. Mood & affect appropriate.     Data Reviewed: I have personally reviewed following labs and imaging studies  CBC: Recent Labs  Lab 04/04/20 1500 04/05/20 0256 04/06/20 0335 04/07/20 0421  WBC 11.1* 14.4* 12.4* 8.1  NEUTROABS 8.1* 11.3* 9.6*  --   HGB 13.2 11.4* 10.8* 9.5*  HCT 39.3 34.3* 32.0* 28.4*  MCV 91.8 93.2 91.4 95.3  PLT 130* 165 116* 100*    Basic Metabolic Panel: Recent Labs  Lab 04/04/20 1500 04/05/20 0256 04/06/20 0335 04/07/20 0421  NA 131* 133* 136 139  K 4.9 5.0 4.3 3.7  CL 96* 96* 101 107  CO2 21* 17* 22 26  GLUCOSE 122* 134* 104* 84  BUN 58* 55* 65* 65*  CREATININE 2.30* 2.73* 2.52* 1.92*  CALCIUM 7.8* 8.0* 8.0* 7.8*  MG  --  2.5*  2.6*  --   PHOS  --   --  5.7*  --     GFR: Estimated Creatinine Clearance: 39.8 mL/min (A) (by C-G formula based on SCr of 1.92 mg/dL (H)).  Liver Function Tests: Recent Labs  Lab 04/04/20 1500 04/05/20 0256 04/06/20 0335 04/07/20 0421  AST 58* 62* 55* 46*  ALT 23 5 23 20   ALKPHOS 133* 125 102 78  BILITOT 2.9* 3.2* 3.3* 2.4*  PROT 6.2* 6.0* 6.1* 5.3*  ALBUMIN 2.5* 2.6* 3.1* 3.0*    CBG: No results for input(s): GLUCAP in the last 168 hours.   Recent Results (from the past 240 hour(s))  Resp Panel by RT PCR (RSV, Flu A&B, Covid) - Nasopharyngeal Swab     Status: None   Collection Time: 04/04/20  8:13 PM   Specimen: Nasopharyngeal Swab  Result Value Ref Range Status  SARS Coronavirus 2 by RT PCR NEGATIVE NEGATIVE Final    Comment: (NOTE) SARS-CoV-2 target nucleic acids are NOT DETECTED.  The SARS-CoV-2 RNA is generally detectable in upper respiratoy specimens during the acute phase of infection. The lowest concentration of SARS-CoV-2 viral copies this assay can detect is 131 copies/mL. A negative result does not preclude SARS-Cov-2 infection and should not be used as the sole basis for treatment or other patient management decisions. A negative result may occur with  improper specimen collection/handling, submission of specimen other than nasopharyngeal swab, presence of viral mutation(s) within the areas targeted by this assay, and inadequate number of viral copies (<131 copies/mL). A negative result must be combined with clinical observations, patient history, and epidemiological information. The expected result is Negative.  Fact Sheet for Patients:  PinkCheek.be  Fact Sheet for Healthcare Providers:  GravelBags.it  This test is no t yet approved or cleared by the [place] FDA and  has been authorized for detection and/or diagnosis of SARS-CoV-2 by FDA under an Emergency Use Authorization (EUA).  This EUA will remain  in effect (meaning this test can be used) for the duration of the COVID-19 declaration under Section 564(b)(1) of the Act, 21 U.S.C. section 360bbb-3(b)(1), unless the authorization is terminated or revoked sooner.     Influenza A by PCR NEGATIVE NEGATIVE Final   Influenza B by PCR NEGATIVE NEGATIVE Final    Comment: (NOTE) The Xpert Xpress SARS-CoV-2/FLU/RSV assay is intended as an aid in  the diagnosis of influenza from Nasopharyngeal swab specimens and  should not be used as a sole basis for treatment. Nasal washings and  aspirates are unacceptable for Xpert Xpress SARS-CoV-2/FLU/RSV  testing.  Fact Sheet for Patients: PinkCheek.be  Fact Sheet for Healthcare Providers: GravelBags.it  This test is not yet approved or cleared by the [place] FDA and  has been authorized for detection and/or diagnosis of SARS-CoV-2 by  FDA under an Emergency Use Authorization (EUA). This EUA will remain  in effect (meaning this test can be used) for the duration of the  Covid-19 declaration under Section 564(b)(1) of the Act, 21  U.S.C. section 360bbb-3(b)(1), unless the authorization is  terminated or revoked.    Respiratory Syncytial Virus by PCR NEGATIVE NEGATIVE Final    Comment: (NOTE) Fact Sheet for Patients: PinkCheek.be  Fact Sheet for Healthcare Providers: GravelBags.it  This test is not yet approved or cleared by the [place] FDA and  has been authorized for detection and/or diagnosis of SARS-CoV-2 by  FDA under an Emergency Use Authorization (EUA). This EUA will remain  in effect (meaning this test can be used) for the duration of the  COVID-19 declaration under Section 564(b)(1) of the Act, 21 U.S.C.  section 360bbb-3(b)(1), unless the authorization is terminated or  revoked. Performed at [facility], [place]  [address], [place], [place] [address]   Culture, body fluid-bottle     Status: None (Preliminary result)   Collection Time: 04/05/20 12:22 PM   Specimen: Fluid  Result Value Ref Range Status   Specimen Description FLUID PERITONEAL  Final   Special Requests BOTTLES DRAWN AEROBIC AND ANAEROBIC  Final   Culture   Final    NO GROWTH 2 DAYS Performed at [facility], [place] [address], [place],  [address]    Report Status PENDING  Incomplete  Gram stain     Status: None   Collection Time: 04/05/20 12:22 PM   Specimen: Fluid  Result Value Ref Range  Status   Specimen Description FLUID PERITONEAL  Final   Special Requests NONE  Final   Gram Stain   Final    NO WBC SEEN NO ORGANISMS SEEN CYTOSPIN SMEAR Performed at [facility], [address] [place], [place] [address]    Report Status 04/05/2020 FINAL  Final         Radiology Studies: No results found.      Scheduled Meds: . amLODipine  10 mg Oral Daily  . atenolol  25 mg Oral Daily  . Chlorhexidine Gluconate Cloth  6 each Topical Daily  . docusate sodium  100 mg Oral BID  . gabapentin  200 mg Oral QHS  .  HYDROmorphone (DILAUDID) injection  1 mg Intravenous Q6H  . lactulose  300 mL Rectal BID  . sodium chloride flush  10-40 mL Intracatheter Q12H  . sodium chloride flush  3 mL Intravenous Q12H   Continuous Infusions: . chlorproMAZINE (THORAZINE) IV       LOS: 3 days    Time spent: 35 minutes    Irine Seal, MD [facility]   To contact the attending provider between 7A-7P or the covering provider during after hours 7P-7A, please log into the web site [email or web address] and access using universal Register password for that web site. If you do not have the password, please call the hospital operator.  04/07/2020, 1:10 PM

## 2020-04-07 NOTE — Progress Notes (Signed)
Daily Progress Note   Patient Name: Alex [name]       Date: 04/07/2020 DOB: 06/09/1950  Age: 69 y.o. MRN#: [id number] Attending Physician: Eugenie Filler, MD Primary Care Physician: Antonietta Jewel, MD Admit Date: 04/04/2020  Reason for Consultation/Follow-up: Establishing goals of care  Subjective: Patient is trying to get out of bed, restless and agitated at the time of my arrival.  Patient not alert, he is awake, attempts to reach out with his hand trying to grab something in the air.  Daughter Crystal and son-in-law present at the bedside.  Length of Stay: 3  Current Medications: Scheduled Meds:  . amLODipine  10 mg Oral Daily  . atenolol  25 mg Oral Daily  . Chlorhexidine Gluconate Cloth  6 each Topical Daily  . docusate sodium  100 mg Oral BID  . gabapentin  200 mg Oral QHS  .  HYDROmorphone (DILAUDID) injection  1 mg Intravenous Q6H  . lactulose  300 mL Rectal BID  . sodium chloride flush  10-40 mL Intracatheter Q12H  . sodium chloride flush  3 mL Intravenous Q12H    Continuous Infusions: . chlorproMAZINE (THORAZINE) IV      PRN Meds: acetaminophen **OR** acetaminophen, antiseptic oral rinse, chlorproMAZINE (THORAZINE) IV, diphenhydrAMINE, glycopyrrolate **OR** glycopyrrolate **OR** glycopyrrolate, haloperidol **OR** haloperidol **OR** haloperidol lactate, HYDROmorphone (DILAUDID) injection, LORazepam **OR** LORazepam **OR** LORazepam, magic mouthwash w/lidocaine, nystatin, ondansetron **OR** ondansetron (ZOFRAN) IV, oxybutynin, polyvinyl alcohol, promethazine, sodium chloride flush, temazepam  Physical Exam         Has coarse wheezes anteriorly and posteriorly Regular rate and rhythm Abdomen is distended has a colostomy Patient is awake but not alert,  confused Has no edema, has purpuric spots both upper extremities  Vital Signs: BP 122/67 (BP Location: Left Arm)   Pulse 90   Temp 97.8 F (36.6 C) (Axillary)   Resp 18   Ht 5\' 6"  (1.676 m)   Wt 95.3 kg   SpO2 99%   BMI 33.89 kg/m  SpO2: SpO2: 99 % O2 Device: O2 Device: Room Air O2 Flow Rate:    Intake/output summary:   Intake/Output Summary (Last 24 hours) at 04/07/2020 1132 Last data filed at 04/07/2020 1000 Gross per 24 hour  Intake --  Output 2050 ml  Net -2050 ml   LBM: Last BM Date: 04/06/20  Baseline Weight: Weight: 95.3 kg Most recent weight: Weight: 95.3 kg       Palliative Assessment/Data: 20%     Patient Active Problem List   Diagnosis Date Noted  . Acute metabolic encephalopathy   . Metabolic acidosis   . Hepatic encephalopathy ([place])   . Pancreatic cancer metastasized to liver ([place]) 04/04/2020  . Elevated LFTs 04/04/2020  . AKI (acute kidney injury) ([place])   . Bacteremia   . Ascites 03/03/2020  . Port-A-Cath in place 10/11/2018  . Goals of care, counseling/discussion 10/04/2018  . Cancer of pancreas, tail ([place]) 10/04/2018  . Genetic testing 08/26/2018  . Malignant tumor of sigmoid colon ([place])   . Colon obstruction ([place])   . Abdominal distension 09/14/2017  . Nausea & vomiting 09/14/2017  . Hypokalemia 09/14/2017  . HLD (hyperlipidemia) 09/14/2017  . Nausea and vomiting 09/14/2017  . Dehydration   . Empyema of right pleural space ([place]) 08/30/2017  . Lobar pneumonia ([place]) 08/27/2017  . Empyema ([place]) 08/27/2017  . Pleural effusion, right 08/27/2017  . Pulmonary nodule, right 08/27/2017  . Benign essential HTN 08/27/2017  . Aortic atherosclerosis ([place]) 08/27/2017  . Anemia 08/26/2017  . Hyponatremia 08/26/2017  . Pleural effusion 08/25/2017  . Community acquired pneumonia of right lower lobe of lung 08/04/2017  . History of stroke 08/04/2017  . Occlusion and stenosis of carotid artery without mention of cerebral infarction 09/07/2011     Palliative Care Assessment & Plan   Patient Profile:  Patient admitted to hospital medicine service for acute kidney injury, acute multifactorial metabolic encephalopathy in the setting of life limiting illness of metastatic stage IV adenocarcinoma of the pancreas and a history of stage II adenocarcinoma of rectosigmoid, patient is status post partial colectomy with end colostomy  Assessment: Generalized pain, distress and discomfort Hyperactive delirium, questionable terminal delirium, acute multifactorial encephalopathy Possible aspiration Poor oral intake Declining functional status  Recommendations/Plan:  Continue comfort measures patient is being uptitrated on both opioids and benzodiazepines for symptom management.  Overall plan remains for maximizing comfort measures and pursuing residential hospice.  Extensive discussions with family undertaken in the evening on 11-9 as well as a day.  All of their questions and concerns addressed to the best of my ability.  Goals of Care and Additional Recommendations:  Limitations on Scope of Treatment: Full Comfort Care  Code Status:    Code Status Orders  (From admission, onward)         Start     Ordered   04/07/20 0752  Do not attempt resuscitation (DNR)  Continuous       Question Answer Comment  In the event of cardiac or respiratory ARREST Do not call a "code blue"   In the event of cardiac or respiratory ARREST Do not perform Intubation, CPR, defibrillation or ACLS   In the event of cardiac or respiratory ARREST Use medication by any route, position, wound care, and other measures to relive pain and suffering. May use oxygen, suction and manual treatment of airway obstruction as needed for comfort.      04/07/20 0755        Code Status History    Date Active Date Inactive Code Status Order ID Comments User Context   04/05/2020 0631 04/07/2020 0755 DNR [id number]  Shanon Rosser, MD ED   04/04/2020 2104 04/05/2020 0631 Full  Code [id number]  Dwyane Dee, MD ED   03/03/2020 1506 03/09/2020 2000 Full Code [id number]  Deatra James, MD ED   09/14/2017 0405 09/25/2017  2107 Full Code [id number]  Ivor Costa, MD ED   08/26/2017 0118 09/04/2017 1346 Full Code [id number]  Jani Gravel, MD Inpatient   08/04/2017 2000 08/05/2017 1543 Full Code [id number]  Etta Quill, DO ED   Advance Care Planning Activity       Prognosis:   < 2 weeks  Discharge Planning:  Hospice facility  Care plan was discussed with  Patient, RN, daughter, son in law.   Thank you for allowing the Palliative Medicine Team to assist in the care of this patient.   Time In: 9 Time Out: 9.35 Total Time 35 Prolonged Time Billed  no       Greater than 50%  of this time was spent counseling and coordinating care related to the above assessment and plan.  Loistine Chance, MD  Please contact Palliative Medicine Team phone at [phone] for questions and concerns.

## 2020-04-08 DIAGNOSIS — R188 Other ascites: Secondary | ICD-10-CM | POA: Diagnosis not present

## 2020-04-08 DIAGNOSIS — K7290 Hepatic failure, unspecified without coma: Secondary | ICD-10-CM | POA: Diagnosis not present

## 2020-04-08 DIAGNOSIS — C787 Secondary malignant neoplasm of liver and intrahepatic bile duct: Secondary | ICD-10-CM

## 2020-04-08 DIAGNOSIS — N179 Acute kidney failure, unspecified: Secondary | ICD-10-CM | POA: Diagnosis not present

## 2020-04-08 DIAGNOSIS — Z515 Encounter for palliative care: Secondary | ICD-10-CM

## 2020-04-08 DIAGNOSIS — C259 Malignant neoplasm of pancreas, unspecified: Secondary | ICD-10-CM | POA: Diagnosis not present

## 2020-04-08 DIAGNOSIS — R531 Weakness: Secondary | ICD-10-CM

## 2020-04-08 LAB — COMPREHENSIVE METABOLIC PANEL
ALT: 21 U/L (ref 0–44)
AST: 42 U/L — ABNORMAL HIGH (ref 15–41)
Albumin: 2.8 g/dL — ABNORMAL LOW (ref 3.5–5.0)
Alkaline Phosphatase: 82 U/L (ref 38–126)
Anion gap: 9 (ref 5–15)
BUN: 62 mg/dL — ABNORMAL HIGH (ref 8–23)
CO2: 24 mmol/L (ref 22–32)
Calcium: 7.9 mg/dL — ABNORMAL LOW (ref 8.9–10.3)
Chloride: 110 mmol/L (ref 98–111)
Creatinine, Ser: 1.86 mg/dL — ABNORMAL HIGH (ref 0.61–1.24)
GFR, Estimated: 39 mL/min — ABNORMAL LOW (ref >60)
Glucose, Bld: 98 mg/dL (ref 70–99)
Potassium: 4 mmol/L (ref 3.5–5.1)
Sodium: 143 mmol/L (ref 135–145)
Total Bilirubin: 2.9 mg/dL — ABNORMAL HIGH (ref 0.3–1.2)
Total Protein: 5.5 g/dL — ABNORMAL LOW (ref 6.5–8.1)

## 2020-04-08 LAB — AMMONIA: Ammonia: 36 umol/L — ABNORMAL HIGH (ref 9–35)

## 2020-04-08 MED ORDER — PROCHLORPERAZINE MALEATE 10 MG PO TABS
ORAL_TABLET | ORAL | Status: AC
  Filled 2020-04-08: qty 1

## 2020-04-08 NOTE — Progress Notes (Signed)
Verbal given to give Robinul early.

## 2020-04-08 NOTE — Progress Notes (Signed)
[facility] ([facility]) Hospital Liaison note.    Received request from TOC manager for family interest in [facility]. Chart and pt information under review by [facility] physician.  Hospice eligibility pending at this time.  [facility] is unable to offer a room today. Hospital Liaison will follow up tomorrow or sooner if a room becomes available. Please do not hesitate to call with questions.    Thank you for the opportunity to participate in this patient's care.  Chrislyn King, BSN, RN [facility] Hospital Liaison (listed on AMION under Hospice/[facility])    [phone]     

## 2020-04-08 NOTE — TOC Progression Note (Signed)
Transition of Care [facility]) - Progression Note    Patient Details  Name: Alex [name] MRN: [id number] Date of Birth: [birth year]  [facility] CM/SW Contact  Leeroy Cha, RN Phone Number: 04/08/2020, 9:49 AM  Clinical Narrative:    [id number]/Text sent to Domenic Moras for [place] hospice referral. At [phone]        Expected Discharge Plan and Services                                                 Social Determinants of Health (SDOH) Interventions    Readmission Risk Interventions Readmission Risk Prevention Plan 03/08/2020  Transportation Screening Complete  PCP or Specialist Appt within 3-5 Days Complete  HRI or [place] Complete  Social Work Consult for [place] Planning/Counseling Complete  Some recent data might be hidden

## 2020-04-08 NOTE — Progress Notes (Addendum)
HEMATOLOGY-ONCOLOGY PROGRESS NOTE  SUBJECTIVE: More alert today.  He is able to tell me his name.  He knew who his oncologist was.  Daughter is at the bedside.  Oncology History  Malignant tumor of sigmoid colon [facility])   Initial Diagnosis   Malignant tumor of sigmoid colon ([place])   08/26/2018 Genetic Testing   Negative genetic testing.  The Hereditary Gene Panel offered by [facility] includes sequencing and/or deletion duplication testing of the following 48 genes: APC, ATM, AXIN2, BARD1, BLM, BMPR1A, BRCA1, BRCA2, BRIP1, BUB1B, CDH1, CDK4, CDKN2A (p14ARF), CDKN2A (p16INK4a), CEP57, CHEK2, CTNNA1, DICER1, ENG, EPCAM (Deletion/duplication testing only), FLCN, GALANT12, GREM1 (promoter region deletion/duplication testing only), KIT, MEN1, MLH1, MLH3, MSH2, MSH3, MSH6, MUTYH, NBN, NF1, NHTL1, PALB2, PDGFRA, PMS2, POLD1, POLE, PTEN, RAD50, RAD51C, RAD51D, RNF43, SDHB, SDHC, SDHD, SMAD4, SMARCA4. STK11, TP53, TSC1, TSC2, and VHL.  The following genes were evaluated for sequence changes only: SDHA and HOXB13 c.251G>A variant only. The report date is August 26, 2018.   Cancer of pancreas, tail ([place])  10/04/2018 Initial Diagnosis   Cancer of pancreas, tail ([place])   10/11/2018 -  Chemotherapy   The patient had PACLitaxel-protein bound (ABRAXANE) chemo infusion 250 mg, 125 mg/m2 = 250 mg (100 % of original dose 125 mg/m2), Intravenous,  Once, 9 of 9 cycles Dose modification: 125 mg/m2 (original dose 125 mg/m2, Cycle 2), 100 mg/m2 (original dose 100 mg/m2, Cycle 13), 80 mg/m2 (original dose 100 mg/m2, Cycle 16, Reason: Provider Judgment) Administration: 250 mg (11/08/2018), 250 mg (11/22/2018), 250 mg (12/06/2018), 250 mg (12/20/2018), 250 mg (01/03/2019), 250 mg (01/17/2019), 250 mg (01/31/2019), 250 mg (02/13/2019), 200 mg (10/03/2019), 200 mg (10/17/2019), 200 mg (10/31/2019), 200 mg (11/14/2019), 200 mg (11/28/2019), 200 mg (12/12/2019), 175 mg (01/02/2020), 175 mg (01/23/2020) gemcitabine (GEMZAR) 2,000 mg in sodium chloride 0.9 % 250  mL chemo infusion, 2,000 mg, Intravenous,  Once, 1 of 1 cycle Administration: 2,000 mg (12/06/2018) gemcitabine (GEMZAR) 2,090 mg in sodium chloride 0.9 % 250 mL chemo infusion, 1,000 mg/m2 = 2,090 mg, Intravenous,  Once, 18 of 18 cycles Dose modification: 800 mg/m2 (original dose 1,000 mg/m2, Cycle 16, Reason: Provider Judgment), 800 mg/m2 (original dose 1,000 mg/m2, Cycle 17, Reason: Provider Judgment) Administration: 2,090 mg (10/11/2018), 2,000 mg (10/25/2018), 2,000 mg (11/08/2018), 2,000 mg (11/22/2018), 2,000 mg (12/20/2018), 2,000 mg (01/03/2019), 2,000 mg (01/17/2019), 2,000 mg (01/31/2019), 2,000 mg (02/13/2019), 2,000 mg (02/28/2019), 2,000 mg (03/14/2019), 2,000 mg (03/28/2019), 2,000 mg (04/11/2019), 2,000 mg (04/29/2019), 2,000 mg (05/16/2019), 2,000 mg (05/31/2019), 2,000 mg (06/13/2019), 2,000 mg (06/27/2019), 2,000 mg (07/11/2019), 2,000 mg (07/25/2019), 2,000 mg (08/08/2019), 2,000 mg (08/22/2019), 2,000 mg (09/05/2019), 2,000 mg (09/19/2019), 2,000 mg (10/03/2019), 2,000 mg (10/17/2019), 2,000 mg (10/31/2019), 2,000 mg (11/14/2019), 2,000 mg (11/28/2019), 2,000 mg (12/12/2019), 1,634 mg (01/02/2020), 1,634 mg (01/23/2020), 1,634 mg (02/13/2020), 1,634 mg (03/25/2020) PACLitaxel-protein bound (ABRAXANE) chemo infusion 250 mg, 125 mg/m2 = 250 mg (100 % of original dose 125 mg/m2), Intravenous, Once, 1 of 1 cycle Dose modification: 100 mg/m2 (original dose 125 mg/m2, Cycle 1, Reason: Provider Judgment), 125 mg/m2 (original dose 125 mg/m2, Cycle 1, Reason: Provider Judgment) Administration: 250 mg (10/11/2018), 250 mg (10/25/2018)  for chemotherapy treatment.     PHYSICAL EXAMINATION:  Vitals:   04/06/20 1450 04/07/20 2331  BP: 122/67 130/74  Pulse: 90 (!) 103  Resp: 18   Temp:    SpO2:  (!) 87%   Filed Weights   04/04/20 1428  Weight: 95.3 kg    Intake/Output from previous day: 11/10 0701 - 11/11  0700 In: 0  Out: 1800 [Urine:1050; Stool:750]  LUNGS: Rhonchi and wheezes anteriorly.  HEART: regular rate &  rhythm ABDOMEN: Distended, nontender, left lower quadrant colostomy.  NEURO: More alert, able to answer some questions  LABORATORY DATA:  I have reviewed the data as listed CMP Latest Ref Rng & Units 04/08/2020 04/07/2020 04/06/2020  Glucose 70 - 99 mg/dL 98 84 104(H)  BUN 8 - 23 mg/dL 62(H) 65(H) 65(H)  Creatinine 0.61 - 1.24 mg/dL 1.86(H) 1.92(H) 2.52(H)  Sodium 135 - 145 mmol/L 143 139 136  Potassium 3.5 - 5.1 mmol/L 4.0 3.7 4.3  Chloride 98 - 111 mmol/L 110 107 101  CO2 22 - 32 mmol/L '24 26 22  ' Calcium 8.9 - 10.3 mg/dL 7.9(L) 7.8(L) 8.0(L)  Total Protein 6.5 - 8.1 g/dL 5.5(L) 5.3(L) 6.1(L)  Total Bilirubin 0.3 - 1.2 mg/dL 2.9(H) 2.4(H) 3.3(H)  Alkaline Phos 38 - 126 U/L 82 78 102  AST 15 - 41 U/L 42(H) 46(H) 55(H)  ALT 0 - 44 U/L '21 20 23    ' Lab Results  Component Value Date   WBC 8.1 04/07/2020   HGB 9.5 (L) 04/07/2020   HCT 28.4 (L) 04/07/2020   MCV 95.3 04/07/2020   PLT 100 (L) 04/07/2020   NEUTROABS 9.6 (H) 04/06/2020    CT ABDOMEN PELVIS WO CONTRAST  Result Date: 04/04/2020 CLINICAL DATA:  Vomiting history colon and pancreatic cancer EXAM: CT ABDOMEN AND PELVIS WITHOUT CONTRAST TECHNIQUE: Multidetector CT imaging of the abdomen and pelvis was performed following the standard protocol without IV contrast. COMPARISON:  March 03, 2020 FINDINGS: Lower chest: The visualized heart size within normal limits. No pericardial fluid/thickening. No hiatal hernia. Streaky atelectasis seen at both lung bases. There is tiny scattered sub 5 mm pulmonary nodules at both lung bases. Hepatobiliary: Although limited due to the lack of intravenous contrast, normal in appearance without gross focal abnormality. No evidence of calcified gallstones or biliary ductal dilatation. Pancreas: Within the tail of the pancreas there is slight interval decrease in the hypoechoic lesion with peripheral calcifications now measuring 1.3 x 1.1 cm, previously 2.0 x 1.6 cm. Spleen: Normal in size. Although  limited due to the lack of intravenous contrast, normal in appearance. Adrenals/Urinary Tract: Both adrenal glands appear normal. The kidneys and collecting system appear normal without evidence of urinary tract calculus or hydronephrosis. Bladder is unremarkable. Stomach/Bowel: The stomach, small bowel, are normal in appearance. Again noted is a left anterior abdominal colostomy. Remainder of the colon is unremarkable. Scattered colonic diverticula are noted. Vascular/Lymphatic: There are no enlarged abdominal or pelvic lymph nodes. Scattered aortic atherosclerosis seen. 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 Reproductive: The prostate is unremarkable. Other: A large amount of abdominopelvic ascites is noted. Small fat containing inguinal hernias are seen. Musculoskeletal: No acute or significant osseous findings. IMPRESSION: 1. Large amount of abdominopelvic ascites. 2. Slight interval decreased size in the pancreatic tail lesion, now measuring 1.3 x 1.1 cm. 3. No definite hepatic lesion identified, however limited due to lack of intravenous contrast. 4. Stable bilateral tiny pulmonary nodules. 5.  Aortic Atherosclerosis (ICD10-I70.0). Electronically Signed   By: Prudencio Pair M.D.   On: 04/04/2020 19:31   CT Head Wo Contrast  Result Date: 04/04/2020 CLINICAL DATA:  Dizziness, vomiting, colon cancer EXAM: CT HEAD WITHOUT CONTRAST TECHNIQUE: Contiguous axial images were obtained from the base of the skull through the vertex without intravenous contrast. COMPARISON:  03/03/2020 FINDINGS: Brain: Chronic encephalomalacia within the left temporal region is stable. No acute infarct or hemorrhage.  Lateral ventricles and midline structures are unremarkable. No acute extra-axial fluid collections. No mass effect. Vascular: No hyperdense vessel or unexpected calcification. Skull: Normal. Negative for fracture or focal lesion. Sinuses/Orbits: No acute finding. Other: None. IMPRESSION: 1. Stable exam, no acute process.  Electronically Signed   By: Randa Ngo M.D.   On: 04/04/2020 16:11   US Paracentesis  Result Date: 04/05/2020 INDICATION: Patient with history of colon cancer, pancreatic cancer, encephalopathy, recurrent ascites. Request received for diagnostic and therapeutic paracentesis. EXAM: ULTRASOUND GUIDED DIAGNOSTIC AND THERAPEUTIC PARACENTESIS MEDICATIONS: 1% lidocaine to skin and subcutaneous tissue COMPLICATIONS: None immediate. PROCEDURE: Informed written consent was obtained from the patient's spouse/son after a discussion of the risks, benefits and alternatives to treatment. A timeout was performed prior to the initiation of the procedure. Initial ultrasound scanning demonstrates a large amount of ascites within the right lower abdominal quadrant. The right lower abdomen was prepped and draped in the usual sterile fashion. 1% lidocaine was used for local anesthesia. Following this, a 19 gauge, 10 cm, Yueh catheter was introduced. An ultrasound image was saved for documentation purposes. The paracentesis was performed. The catheter was removed and a dressing was applied. The patient tolerated the procedure well without immediate post procedural complication. FINDINGS: A total of approximately 6.8 liters of hazy, yellow fluid was removed. Samples were sent to the laboratory as requested by the clinical team. IMPRESSION: Successful ultrasound-guided diagnostic and therapeutic paracentesis yielding 6.8 liters of peritoneal fluid. Read by: Rowe Robert, PA-C Electronically Signed   By: Jacqulynn Cadet M.D.   On: 04/05/2020 12:45   US Paracentesis  Result Date: 03/19/2020 INDICATION: Patient with history of colon and pancreatic cancer, recurrent ascites. Request made for therapeutic paracentesis up to 5 liters. EXAM: ULTRASOUND GUIDED THERAPEUTIC  PARACENTESIS MEDICATIONS: 1% lidocaine to skin and subcutaneous tissue COMPLICATIONS: None immediate. PROCEDURE: Informed written consent was obtained from the  patient after a discussion of the risks, benefits and alternatives to treatment. A timeout was performed prior to the initiation of the procedure. Initial ultrasound scanning demonstrates a large amount of ascites within the right lower abdominal quadrant. The right lower abdomen was prepped and draped in the usual sterile fashion. 1% lidocaine was used for local anesthesia. Following this, a 19 gauge, 10-cm, Yueh catheter was introduced. An ultrasound image was saved for documentation purposes. The paracentesis was performed. The catheter was removed and a dressing was applied. The patient tolerated the procedure well without immediate post procedural complication. FINDINGS: A total of approximately 5 liters of yellow fluid was removed. IMPRESSION: Successful ultrasound-guided therapeutic paracentesis yielding 5 liters of peritoneal fluid. Read by: Rowe Robert, PA-C Electronically Signed   By: Ruthann Cancer MD   On: 03/19/2020 12:35   IR Paracentesis  Result Date: 03/30/2020 INDICATION: Patient with a history of colon and pancreatic cancer with recurrent ascites. Interventional radiology asked to perform a therapeutic paracentesis up to 5 L. EXAM: ULTRASOUND GUIDED PARACENTESIS MEDICATIONS: 1% lidocaine 10 mL COMPLICATIONS: None immediate. PROCEDURE: Informed written consent was obtained from the patient after a discussion of the risks, benefits and alternatives to treatment. A timeout was performed prior to the initiation of the procedure. Initial ultrasound scanning demonstrates a large amount of ascites within the right lower abdominal quadrant. The right lower abdomen was prepped and draped in the usual sterile fashion. 1% lidocaine was used for local anesthesia. Following this, a 19 gauge, 7-cm, Yueh catheter was introduced. An ultrasound image was saved for documentation purposes. The paracentesis was performed. The  catheter was removed and a dressing was applied. The patient tolerated the procedure well  without immediate post procedural complication. FINDINGS: A total of approximately 4 L of hazy yellow fluid was removed. IMPRESSION: Successful ultrasound-guided paracentesis yielding 4 liters of peritoneal fluid. Read by: Soyla Dryer, NP Electronically Signed   By: Aletta Edouard M.D.   On: 03/30/2020 10:17    ASSESSMENT AND PLAN: 1. 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 Well-differentiated adenocarcinoma, 0/13 lymph nodes positive, MSI-stable, no loss of mismatch repair protein expression ? Tumor involving the rectosigmoid junction ? Sigmoidoscopy 09/18/2017-distal sigmoid colon tumor beginning between 15 and 20 cm from the dentate line, completely obstructing ? CT abdomen/pelvis 09/14/2017-sigmoid thickening no evidence of metastatic disease ? 2 mm right upper lobe nodule on CT 08/04/2017 ? Colonoscopy 02/07/2018-polyps removed from the cecum, ascending colon, transverse colon, descending colon, rectosigmoid colon and rectum (tubular adenomas)  2. Right lung pneumonia/empyema March 2019  Right VATS, drainage of empyema, and decortication of the right lower lobe 08/31/2017  3.CVA in 2010  4.Rectal and active sigmoid polyps noted on flexible sigmoidoscopy 09/18/2017  5.Pancreas tail mass/right liver lesions and 4 mm splenic lesion on CT abdomen/pelvis 05/27/2018  MRI abdomen 07/27/2018-right liver lesions, rim-enhancing lesion in the pancreas tail  Ultrasound-guided biopsy of a right liver lesion 08/14/2018-metastatic adenocarcinoma, cytokeratin 7+, CDX-2 weak positive, cytokeratin 20 negative, consistent with metastatic pancreas cancer; MSI stable; tumor mutational burden 0  CT abdomen/pelvis 09/26/2018-increased size of previously noted liver lesions, new right liver lesion, increased size of pancreas mass with occlusion of the splenic vein  Cycle 1 gemcitabine/Abraxane  10/11/2018  Cycle 2 gemcitabine/Abraxane 10/25/2018  Cycle 3 gemcitabine/Abraxane 11/08/2018  Cycle 4 gemcitabine/Abraxane 11/22/2018  Cycle 5 gemcitabine/Abraxane 12/06/2018  CT 12/18/2018-overall improved with reduced size of the hepatic metastatic lesions, mildly reduced size of the pancreatic tail mass.  Cycle 6 gemcitabine/Abraxane 12/20/2018  Cycle 7 gemcitabine/Abraxane 01/03/2019  Cycle 8 gemcitabine/Abraxane 01/17/2019  Cycle 9 gemcitabine/Abraxane 01/31/2019  Cycle 10 gemcitabine/Abraxane 02/13/2019  Cycle 11 gemcitabine/Abraxane 02/28/2019 (Abraxane held secondary to neuropathy symptoms)  Cycle 12 gemcitabine/Abraxane 03/14/2019 (Abraxane held due to persistent neuropathy symptoms)  Cycle 13 gemcitabine/Abraxane 03/28/2019 (Abraxane held due to persistent neuropathy symptoms)  Cycle 14 gemcitabine/Abraxane 04/11/2019 (Abraxane held due to persistent neuropathy symptoms)  CT abdomen/pelvis 04/28/2019-decrease in size of pancreatic mass. Decrease in size of hepatic metastatic foci.   Cycle 15 gemcitabine12/05/2018(Abraxane held due to neuropathy symptoms)  Cycle 16 gemcitabine 05/16/2019 (Abraxane held due to neuropathy symptoms)  Cycle 17 gemcitabine 05/31/2019 (Abraxane on hold due to neuropathy symptoms)  Cycle 18 gemcitabine 06/13/2019 (Abraxane held)  Cycle 19 gemcitabine 06/27/2019 (Abraxane held)  Cycle 20 gemcitabine 07/11/2019 (Abraxane held)  Cycle 21 gemcitabine 07/25/2019 (Abraxane held)  CTabdomen/pelvis 08/05/2019-stable small liver lesions, stable pancreas mass, no evidence of disease progression  Cycle 22 gemcitabine 08/08/2019 (Abraxane held)  Cycle 23 gemcitabine 08/22/2019 (Abraxane held)  Cycle 24 gemcitabine 09/05/2019 (Abraxane held)  Cycle 25 gemcitabine 09/19/2019 (Abraxane held)  CT abdomen/pelvis 09/30/2019-no change in pancreas mass, slight enlargement of right liver metastases, largest measuring 1 cm, no new lesions  Cycle 26  gemcitabine/Abraxane 10/03/2019-Abraxane resumed at a dose of 100 mg/m  Cycle 27 gemcitabine/Abraxane 10/17/2019  Cycle 28 gemcitabine/Abraxane 10/31/2019  Cycle 29 gemcitabine/Abraxane 11/14/2019  Cycle 30 gemcitabine/Abraxane 11/28/2019  Cycle 31 gemcitabine/Abraxane 12/12/2019  CT abdomen/pelvis 12/25/2019-stable right hepatic lesion, no new liver lesions, stable pancreas tail mass, tiny bilateral lung base nodules, new compared to a chest  CT 09/25/2017 and some have progressed compared to a's CT abdomen 08/05/2019  Cycle 32 gemcitabine/Abraxane 01/02/2020 (chemotherapy dose reduced due to neutropenia)  Cycle 33 gemcitabine/Abraxane 01/23/2020  Cycle 34 gemcitabine 02/13/2020 (Abraxane held secondary to neuropathy)  CT abdomen/pelvis 03/03/2020-no significant change in the appearance of tiny bilateral lung nodules at the lower lung zones. Previously characterized subcapsular metastatic liver lesion not seen on current study. No new focal liver abnormality. No biliary ductal dilatation. Index lesion distal tail of pancreas smaller. Interval development of large volume ascites within the abdomen and pelvis. No discrete peritoneal nodule. Portal vein patent. Esophageal and gastric varices identified similar to prior exam.  Gemcitabine every 2 weeks resumed 03/25/2020  CT abdomen/pelvis 04/04/2020-large amount of abdominopelvic ascites, slight decreased size in the pancreatic tail lesion, no definite hepatic lesion identified, stable bilateral tiny pulmonary nodules  6.Port-A-Cath placement by Dr. 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 Hospitalization 03/03/2020 with weakness, marked abdominal distention   CT abdomen/pelvis 03/03/2020-no significant change in the appearance of tiny bilateral lung nodules at the lower lung zones. Previously characterized subcapsular metastatic liver  lesion not seen on current study. No new focal liver abnormality. No biliary ductal dilatation. Index lesion distal tail of pancreas smaller. Interval development of large volume ascites within the abdomen and pelvis. No discrete peritoneal nodule. Portal vein patent. Esophageal and gastric varices identified similar to prior exam.  Brain CT 03/03/2020-negative  Paracentesis 03/03/2020-5 L of fluid removed. Cytology negative.  Paracentesis 03/07/2020-3.4 L of fluid removed. Reactive mesothelial cells.  10. 1 set of blood cultures from the Port-A-Cath positive for staph epidermidis-treated with course of vancomycin 11. Mild thrombocytopenia-likely secondary to chemotherapy 12.  Hospitalization 11/7/721-nausea, vomiting, abdominal distention, AKI, altered mental status  Mr. Maffett continues to have an altered mental status, but is overall more alert and able to answer some questions today. Ammonia level now normal. No evidence of progressive pancreas cancer. Cytology on ascites 04/05/2020 negative for malignant cells. He was seen by palliative care yesterday. Family considering comfort measures and possible placement at [facility], but would like to see if he continues to improve.  If he does improve, would consider taking him home with hospice.  Recommendations: 1.  Since the patient is more alert, recommend trial sitting him up on the side of the bed and trying diet. 2.  If he continues to improve, would recommend reconsulting GI for treatment of hepatic encephalopathy. 3.  Discontinue scheduled hydromorphone    LOS: 4 days   Mikey Bussing 04/08/20  Mr. Mowrey was interviewed and examined.  His daughter was at the bedside.  He is more alert today.  He is oriented to place and follows commands.  I recommend increasing diet and activity as tolerated.

## 2020-04-08 NOTE — Progress Notes (Signed)
Daily Progress Note   Patient Name: Alex [name]       Date: 04/08/2020 DOB: [birth year]  Age: 69 y.o. MRN#: [id number] Attending Physician: Eugenie Filler, MD Primary Care Physician: Antonietta Jewel, MD Admit Date: 04/04/2020  Reason for Consultation/Follow-up: Establishing goals of care  Subjective: Patient is awake, son holding vigil at bedside, patient attempts to reach up with his arms, son states that he has has been having more secretions, more gurgling since mid morning. Son Alex [name] states that he was 52 years old when the patient's mother died at home after a prolonged illness, son recalls that she had gurgling, son Alex [name] asks about gurgling and secretions, we discussed about secretions management and the overall plan.   Length of Stay: 4  Current Medications: Scheduled Meds:  . Chlorhexidine Gluconate Cloth  6 each Topical Daily  . lactulose  300 mL Rectal BID  . sodium chloride flush  10-40 mL Intracatheter Q12H  . sodium chloride flush  3 mL Intravenous Q12H    Continuous Infusions: . chlorproMAZINE (THORAZINE) IV      PRN Meds: acetaminophen **OR** acetaminophen, antiseptic oral rinse, chlorproMAZINE (THORAZINE) IV, diphenhydrAMINE, glycopyrrolate **OR** glycopyrrolate **OR** glycopyrrolate, haloperidol **OR** haloperidol **OR** haloperidol lactate, HYDROmorphone (DILAUDID) injection, LORazepam **OR** LORazepam **OR** LORazepam, magic mouthwash w/lidocaine, nystatin, ondansetron **OR** ondansetron (ZOFRAN) IV, oxybutynin, polyvinyl alcohol, promethazine, sodium chloride flush, temazepam  Physical Exam         Has coarse breath sounds Regular rate and rhythm Abdomen is distended has a colostomy Patient is awake but not alert, confused Has no edema, has purpuric  spots both upper extremities  Vital Signs: BP 130/74 (BP Location: Left Arm)   Pulse (!) 103   Temp 97.8 F (36.6 C) (Axillary)   Resp 18   Ht 5\' 6"  (1.676 m)   Wt 95.3 kg   SpO2 (!) 87%   BMI 33.89 kg/m  SpO2: SpO2: (!) 87 % O2 Device: O2 Device: Room Air O2 Flow Rate:    Intake/output summary:   Intake/Output Summary (Last 24 hours) at 04/08/2020 1534 Last data filed at 04/08/2020 1000 Gross per 24 hour  Intake 0 ml  Output 1600 ml  Net -1600 ml   LBM: Last BM Date: 04/08/20 Baseline Weight: Weight: 95.3 kg Most recent weight: Weight: 95.3 kg  Palliative Assessment/Data: 20%     Patient Active Problem List   Diagnosis Date Noted  . Acute metabolic encephalopathy   . Metabolic acidosis   . Hepatic encephalopathy ([place])   . Pancreatic cancer metastasized to liver ([place]) 04/04/2020  . Elevated LFTs 04/04/2020  . AKI (acute kidney injury) ([place])   . Bacteremia   . Ascites 03/03/2020  . Port-A-Cath in place 10/11/2018  . Goals of care, counseling/discussion 10/04/2018  . Cancer of pancreas, tail ([place]) 10/04/2018  . Genetic testing 08/26/2018  . Malignant tumor of sigmoid colon ([place])   . Colon obstruction ([place])   . Abdominal distension 09/14/2017  . Nausea & vomiting 09/14/2017  . Hypokalemia 09/14/2017  . HLD (hyperlipidemia) 09/14/2017  . Nausea and vomiting 09/14/2017  . Dehydration   . Empyema of right pleural space ([place]) 08/30/2017  . Lobar pneumonia ([place]) 08/27/2017  . Empyema ([place]) 08/27/2017  . Pleural effusion, right 08/27/2017  . Pulmonary nodule, right 08/27/2017  . Benign essential HTN 08/27/2017  . Aortic atherosclerosis ([place]) 08/27/2017  . Anemia 08/26/2017  . Hyponatremia 08/26/2017  . Pleural effusion 08/25/2017  . Community acquired pneumonia of right lower lobe of lung 08/04/2017  . History of stroke 08/04/2017  . 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    Goals of Care and Additional Recommendations:  Limitations on Scope of Treatment: symptom management.   Code Status:    Code Status Orders  (From admission, onward)         Start     Ordered   04/07/20 0752  Do not attempt resuscitation (DNR)  Continuous       Question Answer Comment  In the event of cardiac or respiratory ARREST Do not call a "code blue"   In the event of cardiac or respiratory ARREST Do not perform Intubation, CPR, defibrillation or ACLS   In the event of cardiac or respiratory ARREST Use medication by any route, position, wound care, and other measures to relive pain and suffering. May use oxygen, suction and manual treatment of airway obstruction as needed for comfort.      04/07/20 0755        Code Status History    Date Active Date Inactive Code Status Order ID Comments User Context   04/05/2020 0631 04/07/2020 0755 DNR [id number]  Shanon Rosser, MD ED   04/04/2020 2104 04/05/2020 0631 Full Code [id number]  Dwyane Dee, MD ED   03/03/2020 1506 03/09/2020 2000 Full Code [id number]  Deatra James, MD ED   09/14/2017 0405 09/25/2017 2107 Full Code [id number]  Ivor Costa, MD ED   08/26/2017 0118 09/04/2017  1346 Full Code [id number]  Jani Gravel, MD Inpatient   08/04/2017 2000 08/05/2017 1543 Full Code [id number]  Etta Quill, DO ED   Advance Care Planning Activity  Prognosis:   < 2 weeks  Discharge Planning:  To be determined.   Care plan was discussed with  Son, [facility] MD, onc NP colleague  Thank you for allowing the Palliative Medicine Team to assist in the care of this patient.   Time In: 1500 Time Out: 1535 Total Time 35 Prolonged Time Billed  no       Greater than 50%  of this time was spent counseling and coordinating care related to the above assessment and plan.  Loistine Chance, MD  Please contact Palliative Medicine Team phone at [phone] for questions and concerns.

## 2020-04-08 NOTE — Progress Notes (Signed)
PROGRESS NOTE    AAKASH HOLLOMON  SWH:[id number] DOB: 1951/01/03 DOA: 04/04/2020 PCP: Antonietta Jewel, MD    Chief Complaint  Patient presents with  . Dizziness    Brief Narrative:  HPI per Dr. Sabino Gasser Mr. 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 When asked, he states only approximately 3 episodes of vomiting at home prior to admission.  He also endorses worsening abdominal distention.  His last paracentesis was on 03/30/2020 with 4 L removed. He endorses urinating less than usual with darkening of color but cannot elaborate further. His ostomy bag output has remained at baseline with blend of solid and liquid stool.  ER work-up notable for: CT head unremarkable CT abdomen/pelvis: Large abdominal ascites, small decrease in pancreatic tail lesion.  Stable bilateral pulmonary nodules Labs: Na 131, K 4.9, Cl 96, Co2 21, Glucose 122, BUN 58, Creat 2.3, Alb 2.5, AST 58, ALT 23, AP 133, TB 2.9 WBC 11.1, Hgb 13.2, Hct 39.3, PLTC 130 Lipase 57  He has no known history of CKD prior to October 2021 with normal renal function seen in the past.  Due to his decreased urine output, increased creatinine, increased abdominal distention he is admitted for further treatment of AKI and obtaining paracentesis.   Assessment & Plan:   Principal Problem:   AKI (acute kidney injury) ([place]) Active Problems:   Hyponatremia   Ascites   Pancreatic cancer metastasized to liver (H[facility])   Elevated LFTs   Hepatic encephalopathy (H[facility])   Acute metabolic  encephalopathy   Metabolic acidosis  1 acute metabolic encephalopathy secondary to hepatic encephalopathy Likely secondary to hepatic encephalopathy as patient noted to have a elevated ammonia level on admission.  Patient with stage IV adenocarcinoma of the pancreas and history of stage II adenocarcinoma of the rectosigmoid status post partial colectomy with end colostomy..  Patient with mets to the liver.  Patient on admission noted also to be in acute kidney injury.  ABG done on admission with a pH of 7.46, PCO2 of 24, PO2 of 90, bicarb of 17.3, O2 sats of 96.9.  Ammonia level elevated at 177 and trending down currently at 36.  CT head negative for any acute abnormalities.  Patient noted to be confused and agitated early on this morning.  Patient alert however confused, not able to have a full conversation.  Patient with some gurgling noted. Patient not safe for oral intake.  Patient currently receiving lactulose enemas which we will continue and see if any clinical improvement over the next 24 hours..  Patient seen in consultation by GI who had recommended treating with lactulose, MiraLAX and Xifaxan for diagnosis and therapeutic benefit however would require NG tube administration however family per GI  declined NG tube placement and wanted to focus on comfort care.  Continue lactulose enemas.  Palliative care consulted have assessed the patient discussed with family and decision was made to transition to comfort measures with disposition to residential hospice home.  Scheduled IV Dilaudid discontinued per oncology.  Follow.    2.  History of metastatic stage IV adenocarcinoma  of the pancreas, history of stage II adenocarcinoma of the rectosigmoid status post partial colectomy with end colostomy Patient diagnosed with pancreatic cancer May 27, 2018 being followed by oncology.  Patient currently on gemcitabine every 2 weeks with last dose 03/26/2019.  Patient declining with declining quality of life,  recurrent hospitalizations and recurrent ascites.  Patient status post paracentesis with 6.8 L removed.  Oncology notified and are following.  Discontinued IV fentanyl and placed on IV Dilaudid as needed for pain control.  Scheduled IV Dilaudid discontinued per oncology today.  Palliative care consulted and decision was made to transition to full comfort measures.  Oncology following and does not feel worsening progression of cancer at this time.  Patient however with poor oral intake.  Metabolic encephalopathy.  Deteriorating.  Oncology following.  3.  Acute kidney injury/metabolic acidosis ??  Etiology.  May be secondary to a prerenal azotemia versus secondary to hepatorenal syndrome in the setting of diuretics and spironolactone.  Last creatinine of 1.28 on 03/25/2020.  Patient noted to have presented with nausea vomiting and worsening abdominal distention.  Creatinine currently at 1.86.  CT abdomen and pelvis done on admission negative for hydronephrosis or urinary tract calculus.  Bladder unremarkable.  Patient status post ultrasound-guided paracentesis with 6.8 L removed.  Urinalysis not done on admission.  Patient with urine output of 1.050 L over the past 24 hours.  Patient s/p IV albumin.  Patient was on gentle hydration.  Avoid nephrotoxic medications.  Continue to hold HCTZ and spironolactone.  Palliative care consulted and are following.   4.  Hyponatremia Likely secondary to hypovolemic hyponatremia.  Status post ultrasound-guided paracentesis.  Sodium level at 143.  Status post gentle hydration.  Follow.  5.  Normocytic anemia Patient with no overt bleeding.  Hemoglobin stable at 9.5.  Palliative care following and decision has been made to transition to comfort measures.   6.  Ascites Questionable etiology.  Patient underwent paracentesis with cytology done 03/03/2020, 03/08/2019 which was negative for malignancy.  Status post ultrasound-guided paracentesis during this hospitalization with  6.8 L removed and cytology pending.  Abdomen somewhat distended.  Gram stain and culture negative.  Continue lactulose enemas.  Supportive care.   7.  Obesity  8.  Prognosis Patient with history of stage II rectosigmoid adenocarcinoma status post partial colectomy with end colostomy, stage IV pancreatic cancer with mets to the liver, splenic lesion, bilateral lung base nodules.  Patient presenting with altered mental status.  Ammonia levels elevated but trended down.  Concern for hepatic encephalopathy.  Patient also noted to have ascites status post ultrasound-guided paracentesis.  Patient still confused.  Patient deteriorating.  Patient currently receiving lactulose enemas.  Patient with no oral intake.  Patient with some gurgling noted.  Patient overall with a poor prognosis.  Patient seen in consultation by palliative care who discussed with family and decision was made to transition to full comfort measures with likely disposition of residential hospice home.  Patient currently being kept comfortable.  Continue lactulose enemas to see if any significant improvement.  Patient with no oral intake.  Per RN patient unsafe for oral medications at this time.  Palliative care following.    DVT prophylaxis: SCDs Code Status: DNR Family Communication: Updated ex-wife and son at bedside.  Disposition:   Status is: Inpatient    Dispo: The patient is from: Home              Anticipated d/c is to: If continued deterioration likely residential hospice  home.              Anticipated d/c date is: To be determined              Patient currently confused, agitation, likely hepatic encephalopathy, deteriorating, stage II rectosigmoid adenocarcinoma status post partial colectomy with end colostomy, stage IV pancreatic cancer with mets to the liver, splenic lesion, bilateral lung base nodules.  Patient with no oral intake.  Not stable for discharge.       Consultants:   Palliative care: Dr.  Rowe Pavy  Oncology: Dr. Benay Spice  Gastroenterology: Dr. Tarri Glenn 04/05/2020  Procedures:  CT abdomen and pelvis 04/04/2020  CT head without contrast 04/04/2020  Ultrasound-guided paracentesis 04/05/2020 per IR Dr. Devra Dopp L of peritoneal fluid removed  Antimicrobials:   None   Subjective: Patient sitting up in bed, alert only.  Confused.  Ex-wife at bedside.  Gurgling noted.  Unable to have a conversation.  Patient with no oral intake.  Objective: Vitals:   04/06/20 0200 04/06/20 0538 04/06/20 1450 04/07/20 2331  BP: 113/60 127/77 122/67 130/74  Pulse: 90 98 90 (!) 103  Resp: 20 20 18    Temp: 97.6 F (36.4 C) 97.8 F (36.6 C)    TempSrc: Oral Axillary    SpO2: 98% 99%  (!) 87%  Weight:      Height:        Intake/Output Summary (Last 24 hours) at 04/08/2020 1333 Last data filed at 04/08/2020 1000 Gross per 24 hour  Intake 0 ml  Output 1850 ml  Net -1850 ml   Filed Weights   04/04/20 1428  Weight: 95.3 kg    Examination:  General exam: Alert.  Confused. Respiratory system: Gurgling noted.  No wheezes.  Normal respiratory effort.  Cardiovascular system: RRR no murmurs rubs or gallops.  No JVD.  No lower extremity edema.   Gastrointestinal system: Abdomen is distended, hypoactive bowel sounds, soft, nontender to palpation.  Colostomy with loose stool noted. Central nervous system: Alert only.. No focal neurological deficits. Extremities: Symmetric 5 x 5 power. Skin: No rashes, lesions or ulcers Psychiatry: Judgement and insight appear poor. Mood & affect appropriate.     Data Reviewed: I have personally reviewed following labs and imaging studies  CBC: Recent Labs  Lab 04/04/20 1500 04/05/20 0256 04/06/20 0335 04/07/20 0421  WBC 11.1* 14.4* 12.4* 8.1  NEUTROABS 8.1* 11.3* 9.6*  --   HGB 13.2 11.4* 10.8* 9.5*  HCT 39.3 34.3* 32.0* 28.4*  MCV 91.8 93.2 91.4 95.3  PLT 130* 165 116* 100*    Basic Metabolic Panel: Recent Labs  Lab 04/04/20 1500  04/05/20 0256 04/06/20 0335 04/07/20 0421 04/08/20 0420  NA 131* 133* 136 139 143  K 4.9 5.0 4.3 3.7 4.0  CL 96* 96* 101 107 110  CO2 21* 17* 22 26 24   GLUCOSE 122* 134* 104* 84 98  BUN 58* 55* 65* 65* 62*  CREATININE 2.30* 2.73* 2.52* 1.92* 1.86*  CALCIUM 7.8* 8.0* 8.0* 7.8* 7.9*  MG  --  2.5* 2.6*  --   --   PHOS  --   --  5.7*  --   --     GFR: Estimated Creatinine Clearance: 41.1 mL/min (A) (by C-G formula based on SCr of 1.86 mg/dL (H)).  Liver Function Tests: Recent Labs  Lab 04/04/20 1500 04/05/20 0256 04/06/20 0335 04/07/20 0421 04/08/20 0420  AST 58* 62* 55* 46* 42*  ALT 23 5 23 20 21   ALKPHOS 133* 125 102 78 82  BILITOT 2.9* 3.2* 3.3* 2.4* 2.9*  PROT 6.2* 6.0* 6.1* 5.3* 5.5*  ALBUMIN 2.5* 2.6* 3.1* 3.0* 2.8*    CBG: No results for input(s): GLUCAP in the last 168 hours.   Recent Results (from the past 240 hour(s))  Resp Panel by RT PCR (RSV, Flu A&B, Covid) - Nasopharyngeal Swab     Status: None   Collection Time: 04/04/20  8:13 PM   Specimen: Nasopharyngeal Swab  Result Value Ref Range Status   SARS Coronavirus 2 by RT PCR NEGATIVE NEGATIVE Final    Comment: (NOTE) SARS-CoV-2 target nucleic acids are NOT DETECTED.  The SARS-CoV-2 RNA is generally detectable in upper respiratoy specimens during the acute phase of infection. The lowest concentration of SARS-CoV-2 viral copies this assay can detect is 131 copies/mL. A negative result does not preclude SARS-Cov-2 infection and should not be used as the sole basis for treatment or other patient management decisions. A negative result may occur with  improper specimen collection/handling, submission of specimen other than nasopharyngeal swab, presence of viral mutation(s) within the areas targeted by this assay, and inadequate number of viral copies (<131 copies/mL). A negative result must be combined with clinical observations, patient history, and epidemiological information. The expected result is  Negative.  Fact Sheet for Patients:  PinkCheek.be  Fact Sheet for Healthcare Providers:  GravelBags.it  This test is no t yet approved or cleared by the [place] FDA and  has been authorized for detection and/or diagnosis of SARS-CoV-2 by FDA under an Emergency Use Authorization (EUA). This EUA will remain  in effect (meaning this test can be used) for the duration of the COVID-19 declaration under Section 564(b)(1) of the Act, 21 U.S.C. section 360bbb-3(b)(1), unless the authorization is terminated or revoked sooner.     Influenza A by PCR NEGATIVE NEGATIVE Final   Influenza B by PCR NEGATIVE NEGATIVE Final    Comment: (NOTE) The Xpert Xpress SARS-CoV-2/FLU/RSV assay is intended as an aid in  the diagnosis of influenza from Nasopharyngeal swab specimens and  should not be used as a sole basis for treatment. Nasal washings and  aspirates are unacceptable for Xpert Xpress SARS-CoV-2/FLU/RSV  testing.  Fact Sheet for Patients: PinkCheek.be  Fact Sheet for Healthcare Providers: GravelBags.it  This test is not yet approved or cleared by the [place] FDA and  has been authorized for detection and/or diagnosis of SARS-CoV-2 by  FDA under an Emergency Use Authorization (EUA). This EUA will remain  in effect (meaning this test can be used) for the duration of the  Covid-19 declaration under Section 564(b)(1) of the Act, 21  U.S.C. section 360bbb-3(b)(1), unless the authorization is  terminated or revoked.    Respiratory Syncytial Virus by PCR NEGATIVE NEGATIVE Final    Comment: (NOTE) Fact Sheet for Patients: PinkCheek.be  Fact Sheet for Healthcare Providers: GravelBags.it  This test is not yet approved or cleared by the [place] FDA and  has been authorized for detection and/or diagnosis of  SARS-CoV-2 by  FDA under an Emergency Use Authorization (EUA). This EUA will remain  in effect (meaning this test can be used) for the duration of the  COVID-19 declaration under Section 564(b)(1) of the Act, 21 U.S.C.  section 360bbb-3(b)(1), unless the authorization is terminated or  revoked. Performed at [facility], [place] [address] [place], [place] [address]   Culture, body fluid-bottle     Status: None (Preliminary result)   Collection Time: 04/05/20 12:22 PM   Specimen: Fluid  Result Value Ref Range Status   Specimen Description FLUID PERITONEAL  Final   Special Requests BOTTLES DRAWN AEROBIC AND ANAEROBIC  Final   Culture   Final    NO GROWTH 3 DAYS Performed at [facility], [address], [place], [place] [address]    Report Status PENDING  Incomplete  Gram stain     Status: None   Collection Time: 04/05/20 12:22 PM   Specimen: Fluid  Result Value Ref Range Status   Specimen Description FLUID PERITONEAL  Final   Special Requests NONE  Final   Gram Stain   Final    NO WBC SEEN NO ORGANISMS SEEN CYTOSPIN SMEAR Performed at [facility], [address] [place], [place] [address]    Report Status 04/05/2020 FINAL  Final         Radiology Studies: No results found.      Scheduled Meds: . Chlorhexidine Gluconate Cloth  6 each Topical Daily  .  HYDROmorphone (DILAUDID) injection  1 mg Intravenous Q6H  . lactulose  300 mL Rectal BID  . sodium chloride flush  10-40 mL Intracatheter Q12H  . sodium chloride flush  3 mL Intravenous Q12H   Continuous Infusions: . chlorproMAZINE (THORAZINE) IV       LOS: 4 days    Time spent: 35 minutes    Irine Seal, MD [facility]   To contact the attending provider between 7A-7P or the covering provider during after hours 7P-7A, please log into the web site [email or web address] and access using universal [address] password for that web site. If you do not have the password,  please call the hospital operator.  04/08/2020, 1:33 PM

## 2020-04-09 ENCOUNTER — Inpatient Hospital Stay: Payer: Medicare Other

## 2020-04-09 ENCOUNTER — Ambulatory Visit: Payer: 59 | Admitting: Nurse Practitioner

## 2020-04-09 ENCOUNTER — Inpatient Hospital Stay: Payer: Medicare Other | Admitting: Oncology

## 2020-04-09 DIAGNOSIS — Z515 Encounter for palliative care: Secondary | ICD-10-CM

## 2020-04-09 DIAGNOSIS — Z7189 Other specified counseling: Secondary | ICD-10-CM | POA: Diagnosis not present

## 2020-04-09 DIAGNOSIS — R0602 Shortness of breath: Secondary | ICD-10-CM | POA: Diagnosis not present

## 2020-04-09 DIAGNOSIS — G9341 Metabolic encephalopathy: Secondary | ICD-10-CM | POA: Diagnosis not present

## 2020-04-09 DIAGNOSIS — E860 Dehydration: Secondary | ICD-10-CM | POA: Diagnosis not present

## 2020-04-09 DIAGNOSIS — N179 Acute kidney failure, unspecified: Secondary | ICD-10-CM | POA: Diagnosis not present

## 2020-04-09 DIAGNOSIS — R188 Other ascites: Secondary | ICD-10-CM | POA: Diagnosis not present

## 2020-04-09 LAB — COMPREHENSIVE METABOLIC PANEL
ALT: 22 U/L (ref 0–44)
AST: 40 U/L (ref 15–41)
Albumin: 3 g/dL — ABNORMAL LOW (ref 3.5–5.0)
Alkaline Phosphatase: 95 U/L (ref 38–126)
Anion gap: 14 (ref 5–15)
BUN: 67 mg/dL — ABNORMAL HIGH (ref 8–23)
CO2: 21 mmol/L — ABNORMAL LOW (ref 22–32)
Calcium: 7.9 mg/dL — ABNORMAL LOW (ref 8.9–10.3)
Chloride: 112 mmol/L — ABNORMAL HIGH (ref 98–111)
Creatinine, Ser: 1.77 mg/dL — ABNORMAL HIGH (ref 0.61–1.24)
GFR, Estimated: 41 mL/min — ABNORMAL LOW (ref >60)
Glucose, Bld: 126 mg/dL — ABNORMAL HIGH (ref 70–99)
Potassium: 4.2 mmol/L (ref 3.5–5.1)
Sodium: 147 mmol/L — ABNORMAL HIGH (ref 135–145)
Total Bilirubin: 3.5 mg/dL — ABNORMAL HIGH (ref 0.3–1.2)
Total Protein: 5.9 g/dL — ABNORMAL LOW (ref 6.5–8.1)

## 2020-04-09 LAB — CBC WITH DIFFERENTIAL/PLATELET
Abs Immature Granulocytes: 0.13 10*3/uL — ABNORMAL HIGH (ref 0.00–0.07)
Basophils Absolute: 0 10*3/uL (ref 0.0–0.1)
Basophils Relative: 0 %
Eosinophils Absolute: 0 10*3/uL (ref 0.0–0.5)
Eosinophils Relative: 0 %
HCT: 35.7 % — ABNORMAL LOW (ref 39.0–52.0)
Hemoglobin: 11.8 g/dL — ABNORMAL LOW (ref 13.0–17.0)
Immature Granulocytes: 1 %
Lymphocytes Relative: 6 %
Lymphs Abs: 0.6 10*3/uL — ABNORMAL LOW (ref 0.7–4.0)
MCH: 31.6 pg (ref 26.0–34.0)
MCHC: 33.1 g/dL (ref 30.0–36.0)
MCV: 95.5 fL (ref 80.0–100.0)
Monocytes Absolute: 1.6 10*3/uL — ABNORMAL HIGH (ref 0.1–1.0)
Monocytes Relative: 15 %
Neutro Abs: 8.1 10*3/uL — ABNORMAL HIGH (ref 1.7–7.7)
Neutrophils Relative %: 78 %
Platelets: 180 10*3/uL (ref 150–400)
RBC: 3.74 MIL/uL — ABNORMAL LOW (ref 4.22–5.81)
RDW: 20.4 % — ABNORMAL HIGH (ref 11.5–15.5)
WBC: 10.4 10*3/uL (ref 4.0–10.5)
nRBC: 0.2 % (ref 0.0–0.2)

## 2020-04-09 LAB — AMMONIA: Ammonia: 49 umol/L — ABNORMAL HIGH (ref 9–35)

## 2020-04-09 MED ORDER — LACTULOSE ENEMA: 300.0000 mL | Freq: Two times a day (BID) | ORAL | Status: AC

## 2020-04-09 MED ORDER — TEMAZEPAM 15 MG PO CAPS: 15.0000 mg | ORAL_CAPSULE | Freq: Every evening | ORAL | 0 refills | Status: AC | PRN

## 2020-04-09 MED ORDER — MORPHINE SULFATE (CONCENTRATE) 20 MG/ML PO SOLN: 5.0000 mg | ORAL | 0 refills | Status: AC | PRN

## 2020-04-09 MED ORDER — HYDROMORPHONE HCL 1 MG/ML IJ SOLN
0.5000 mg | INTRAMUSCULAR | Status: DC | PRN
  Administered 2020-04-09: 1 mg via INTRAVENOUS
  Filled 2020-04-09: qty 1

## 2020-04-09 MED ORDER — ONDANSETRON 4 MG PO TBDP: 4.0000 mg | ORAL_TABLET | Freq: Four times a day (QID) | ORAL | 0 refills | Status: AC | PRN

## 2020-04-09 MED ORDER — HYDROMORPHONE HCL 1 MG/ML IJ SOLN: 0.5000 mg | INTRAMUSCULAR | 0 refills | Status: AC | PRN

## 2020-04-09 MED ORDER — LORAZEPAM 2 MG/ML IJ SOLN: 1.0000 mg | Freq: Once | INTRAMUSCULAR | Status: DC

## 2020-04-09 MED ORDER — SCOPOLAMINE 1 MG/3DAYS TD PT72
1.0000 | MEDICATED_PATCH | TRANSDERMAL | Status: DC
  Administered 2020-04-09: 1.5 mg via TRANSDERMAL
  Filled 2020-04-09: qty 1

## 2020-04-09 MED ORDER — GLYCOPYRROLATE 1 MG PO TABS: 1.0000 mg | ORAL_TABLET | ORAL | 0 refills | Status: AC | PRN

## 2020-04-09 MED ORDER — LORAZEPAM 2 MG/ML PO CONC: 1.0000 mg | ORAL | 0 refills | Status: AC | PRN

## 2020-04-09 MED ORDER — HALOPERIDOL 0.5 MG PO TABS: 0.5000 mg | ORAL_TABLET | ORAL | 0 refills | Status: AC | PRN

## 2020-04-09 MED ORDER — DEXTROSE 5 % IV SOLN: INTRAVENOUS | Status: DC

## 2020-04-09 NOTE — TOC Transition Note (Signed)
Transition of Care [facility]) - CM/SW Discharge Note   Patient Details  Name: Alex [name] MRN: [id number] Date of Birth: [birth year]  [facility] of [facility]) CM/SW Contact:  Leeroy Cha, RN Phone Number: 04/09/2020, 3:41 PM   Clinical Narrative:    PLAN IS TO TRANSFER PATIENT TO [facility] VIA PTAR, PACKET FOR INFORMATION GIVEN TO FLOOR RN.   Final next level of care: [place] Barriers to Discharge: No Barriers Identified   Patient Goals and CMS Choice     Choice offered to / list presented to : Adult Children, Spouse  Discharge Placement                       Discharge Plan and Services                                     Social Determinants of Health (SDOH) Interventions     Readmission Risk Interventions Readmission Risk Prevention Plan 03/08/2020  Transportation Screening Complete  PCP or Specialist Appt within 3-5 Days Complete  HRI or [place] Complete  Social Work Consult for [place] Planning/Counseling Complete  Some recent data might be hidden

## 2020-04-09 NOTE — Progress Notes (Signed)
Daily Progress Note   Patient Name: Alex [name]       Date: 04/09/2020 DOB: 08-Dec-1950  Age: 69 y.o. MRN#: [id number] Attending Physician: Eugenie Filler, MD Primary Care Physician: Antonietta Jewel, MD Admit Date: 04/04/2020  Reason for Consultation/Follow-up: Establishing goals of care  Subjective: Patient is resting, using abdominal and chest wall accessory muscles, he is in mild distress, has required 4.5 mg IV Dilaudid since the past 24 hours, sister and 2 other family members at bedside, we talked about terminal secretions and pain/agitation management at end of life.    Length of Stay: 5  Current Medications: Scheduled Meds:  . Chlorhexidine Gluconate Cloth  6 each Topical Daily  . lactulose  300 mL Rectal BID  . LORazepam  1 mg Intravenous Once  . scopolamine  1 patch Transdermal Q72H  . sodium chloride flush  10-40 mL Intracatheter Q12H  . sodium chloride flush  3 mL Intravenous Q12H    Continuous Infusions: . chlorproMAZINE (THORAZINE) IV    . dextrose 75 mL/hr at 04/09/20 0827    PRN Meds: acetaminophen **OR** acetaminophen, antiseptic oral rinse, chlorproMAZINE (THORAZINE) IV, diphenhydrAMINE, glycopyrrolate **OR** glycopyrrolate **OR** glycopyrrolate, haloperidol **OR** haloperidol **OR** haloperidol lactate, HYDROmorphone (DILAUDID) injection, LORazepam **OR** LORazepam **OR** LORazepam, magic mouthwash w/lidocaine, nystatin, ondansetron **OR** ondansetron (ZOFRAN) IV, oxybutynin, polyvinyl alcohol, promethazine, sodium chloride flush, temazepam  Physical Exam         Has coarse breath sounds Regular rate and rhythm Abdomen is distended has a colostomy Patient is not awake not alert, confused Has no edema, has purpuric spots both upper extremities  Vital  Signs: BP (!) 151/81 (BP Location: Right Arm)   Pulse (!) 108   Temp (!) 97.4 F (36.3 C) (Axillary)   Resp (!) 21   Ht 5\' 6"  (1.676 m)   Wt 95.3 kg   SpO2 94%   BMI 33.89 kg/m  SpO2: SpO2: 94 % O2 Device: O2 Device: Room Air O2 Flow Rate:    Intake/output summary:   Intake/Output Summary (Last 24 hours) at 04/09/2020 1532 Last data filed at 04/09/2020 1300 Gross per 24 hour  Intake 318.39 ml  Output 1100 ml  Net -781.61 ml   LBM: Last BM Date: 04/08/20 Baseline Weight: Weight: 95.3 kg Most recent weight: Weight: 95.3 kg  Palliative Assessment/Data: 20%     Patient Active Problem List   Diagnosis Date Noted  . Palliative care by specialist   . General weakness   . Acute metabolic encephalopathy   . Metabolic acidosis   . Hepatic encephalopathy ([place])   . Pancreatic cancer metastasized to liver ([place]) 04/04/2020  . Elevated LFTs 04/04/2020  . AKI (acute kidney injury) ([place])   . Bacteremia   . Ascites 03/03/2020  . Port-A-Cath in place 10/11/2018  . Goals of care, counseling/discussion 10/04/2018  . Cancer of pancreas, tail ([place]) 10/04/2018  . Genetic testing 08/26/2018  . Malignant tumor of sigmoid colon ([place])   . Colon obstruction ([place])   . Abdominal distension 09/14/2017  . Nausea & vomiting 09/14/2017  . Hypokalemia 09/14/2017  . HLD (hyperlipidemia) 09/14/2017  . Nausea and vomiting 09/14/2017  . Dehydration   . Empyema of right pleural space ([place]) 08/30/2017  . Lobar pneumonia ([place]) 08/27/2017  . Empyema ([place]) 08/27/2017  . Pleural effusion, right 08/27/2017  . Pulmonary nodule, right 08/27/2017  . Benign essential HTN 08/27/2017  . Aortic atherosclerosis ([place]) 08/27/2017  . Anemia 08/26/2017  . Hyponatremia 08/26/2017  . Pleural effusion 08/25/2017  . Community acquired pneumonia of right lower lobe of lung 08/04/2017  . History of stroke 08/04/2017  . 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   Will recommend residential hospice once bed available.   Prognosis few days to less than 2 weeks in my opinion.    Goals of Care and Additional Recommendations:  Limitations on Scope of Treatment: symptom management.   Code Status: DNR.     Code Status Orders  (From admission, onward)         Start     Ordered   04/07/20 0752  Do not attempt resuscitation (DNR)  Continuous       Question Answer Comment  In the event of cardiac or respiratory ARREST Do not call a "code blue"   In the event of cardiac or respiratory ARREST Do not perform Intubation, CPR, defibrillation or ACLS   In the event of cardiac or respiratory ARREST Use medication by any route, position, wound care, and other measures to relive pain and suffering. May use oxygen, suction and manual treatment of airway obstruction as needed for comfort.      04/07/20 0755        Code Status History    Date Active Date Inactive Code Status Order ID Comments User Context   04/05/2020 0631 04/07/2020 0755 DNR [id number]  Shanon Rosser, MD ED   04/04/2020 2104 04/05/2020 0631 Full Code [id number]  Dwyane Dee, MD ED   03/03/2020 1506 03/09/2020 2000 Full Code [id number]  Deatra James, MD  ED   09/14/2017 0405 09/25/2017 2107 Full Code [id number]  Ivor Costa, MD ED   08/26/2017 0118 09/04/2017 1346 Full Code [id number]  Jani Gravel,  MD Inpatient   08/04/2017 2000 08/05/2017 1543 Full Code [id number]  Etta Quill, DO ED   Advance Care Planning Activity       Prognosis:   < 2 weeks  Discharge Planning:  Residential hospice once there is a bed available.   Care plan was discussed with  Family holding vigil at bedside.   Thank you for allowing the Palliative Medicine Team to assist in the care of this patient.   Time In: 1500 Time Out: 1535 Total Time 35 Prolonged Time Billed  no       Greater than 50%  of this time was spent counseling and coordinating care related to the above assessment and plan.  Loistine Chance, MD  Please contact Palliative Medicine Team phone at [phone] for questions and concerns.

## 2020-04-09 NOTE — Progress Notes (Signed)
Manufacturing engineer [facility]) Hospital Liaison note.    Chart reviewed and eligibility confirmed. Bed offered for today at [facility]. Spoke with family to confirm interest and explain services. Family agreeable to transfer today. TOC aware.    Registration paperwork has been completed.  Please arrange transport.   RN please call report to [phone].  Please be sure the DNR form transports with the patient.  Thank you for the opportunity to participate in this patient's care.  Chrislyn Edison Pace, BSN, RN [place] (listed on [facility])    [phone]

## 2020-04-09 NOTE — Progress Notes (Addendum)
HEMATOLOGY-ONCOLOGY PROGRESS NOTE  SUBJECTIVE: Opens eyes, not answering questions today.  Son is at the bedside.   Oncology History  Malignant tumor of sigmoid colon [facility])   Initial Diagnosis   Malignant tumor of sigmoid colon ([place])   08/26/2018 Genetic Testing   Negative genetic testing.  The Hereditary Gene Panel offered by [facility] includes sequencing and/or deletion duplication testing of the following 48 genes: APC, ATM, AXIN2, BARD1, BLM, BMPR1A, BRCA1, BRCA2, BRIP1, BUB1B, CDH1, CDK4, CDKN2A (p14ARF), CDKN2A (p16INK4a), CEP57, CHEK2, CTNNA1, DICER1, ENG, EPCAM (Deletion/duplication testing only), FLCN, GALANT12, GREM1 (promoter region deletion/duplication testing only), KIT, MEN1, MLH1, MLH3, MSH2, MSH3, MSH6, MUTYH, NBN, NF1, NHTL1, PALB2, PDGFRA, PMS2, POLD1, POLE, PTEN, RAD50, RAD51C, RAD51D, RNF43, SDHB, SDHC, SDHD, SMAD4, SMARCA4. STK11, TP53, TSC1, TSC2, and VHL.  The following genes were evaluated for sequence changes only: SDHA and HOXB13 c.251G>A variant only. The report date is August 26, 2018.   Cancer of pancreas, tail ([place])  10/04/2018 Initial Diagnosis   Cancer of pancreas, tail ([place])   10/11/2018 -  Chemotherapy   The patient had PACLitaxel-protein bound (ABRAXANE) chemo infusion 250 mg, 125 mg/m2 = 250 mg (100 % of original dose 125 mg/m2), Intravenous,  Once, 9 of 9 cycles Dose modification: 125 mg/m2 (original dose 125 mg/m2, Cycle 2), 100 mg/m2 (original dose 100 mg/m2, Cycle 13), 80 mg/m2 (original dose 100 mg/m2, Cycle 16, Reason: Provider Judgment) Administration: 250 mg (11/08/2018), 250 mg (11/22/2018), 250 mg (12/06/2018), 250 mg (12/20/2018), 250 mg (01/03/2019), 250 mg (01/17/2019), 250 mg (01/31/2019), 250 mg (02/13/2019), 200 mg (10/03/2019), 200 mg (10/17/2019), 200 mg (10/31/2019), 200 mg (11/14/2019), 200 mg (11/28/2019), 200 mg (12/12/2019), 175 mg (01/02/2020), 175 mg (01/23/2020) gemcitabine (GEMZAR) 2,000 mg in sodium chloride 0.9 % 250 mL chemo infusion, 2,000 mg, Intravenous,   Once, 1 of 1 cycle Administration: 2,000 mg (12/06/2018) gemcitabine (GEMZAR) 2,090 mg in sodium chloride 0.9 % 250 mL chemo infusion, 1,000 mg/m2 = 2,090 mg, Intravenous,  Once, 18 of 18 cycles Dose modification: 800 mg/m2 (original dose 1,000 mg/m2, Cycle 16, Reason: Provider Judgment), 800 mg/m2 (original dose 1,000 mg/m2, Cycle 17, Reason: Provider Judgment) Administration: 2,090 mg (10/11/2018), 2,000 mg (10/25/2018), 2,000 mg (11/08/2018), 2,000 mg (11/22/2018), 2,000 mg (12/20/2018), 2,000 mg (01/03/2019), 2,000 mg (01/17/2019), 2,000 mg (01/31/2019), 2,000 mg (02/13/2019), 2,000 mg (02/28/2019), 2,000 mg (03/14/2019), 2,000 mg (03/28/2019), 2,000 mg (04/11/2019), 2,000 mg (04/29/2019), 2,000 mg (05/16/2019), 2,000 mg (05/31/2019), 2,000 mg (06/13/2019), 2,000 mg (06/27/2019), 2,000 mg (07/11/2019), 2,000 mg (07/25/2019), 2,000 mg (08/08/2019), 2,000 mg (08/22/2019), 2,000 mg (09/05/2019), 2,000 mg (09/19/2019), 2,000 mg (10/03/2019), 2,000 mg (10/17/2019), 2,000 mg (10/31/2019), 2,000 mg (11/14/2019), 2,000 mg (11/28/2019), 2,000 mg (12/12/2019), 1,634 mg (01/02/2020), 1,634 mg (01/23/2020), 1,634 mg (02/13/2020), 1,634 mg (03/25/2020) PACLitaxel-protein bound (ABRAXANE) chemo infusion 250 mg, 125 mg/m2 = 250 mg (100 % of original dose 125 mg/m2), Intravenous, Once, 1 of 1 cycle Dose modification: 100 mg/m2 (original dose 125 mg/m2, Cycle 1, Reason: Provider Judgment), 125 mg/m2 (original dose 125 mg/m2, Cycle 1, Reason: Provider Judgment) Administration: 250 mg (10/11/2018), 250 mg (10/25/2018)  for chemotherapy treatment.     PHYSICAL EXAMINATION:  Vitals:   04/07/20 2331 04/08/20 2313  BP: 130/74 (!) 151/81  Pulse: (!) 103 (!) 108  Resp:  (!) 21  Temp:  (!) 97.4 F (36.3 C)  SpO2: (!) 87% 94%   Filed Weights   04/04/20 1428  Weight: 95.3 kg    Intake/Output from previous day: 11/11 0701 - 11/12 0700 In: 0  Out:  1650 [Urine:1600; Stool:50]  LUNGS: Rhonchi and wheezes anteriorly.  No respiratory distress HEART:  regular rate & rhythm ABDOMEN: Distended, nontender, left lower quadrant colostomy.  NEURO: Alert, follows simple commands  LABORATORY DATA:  I have reviewed the data as listed CMP Latest Ref Rng & Units 04/09/2020 04/08/2020 04/07/2020  Glucose 70 - 99 mg/dL 126(H) 98 84  BUN 8 - 23 mg/dL 67(H) 62(H) 65(H)  Creatinine 0.61 - 1.24 mg/dL 1.77(H) 1.86(H) 1.92(H)  Sodium 135 - 145 mmol/L 147(H) 143 139  Potassium 3.5 - 5.1 mmol/L 4.2 4.0 3.7  Chloride 98 - 111 mmol/L 112(H) 110 107  CO2 22 - 32 mmol/L 21(L) 24 26  Calcium 8.9 - 10.3 mg/dL 7.9(L) 7.9(L) 7.8(L)  Total Protein 6.5 - 8.1 g/dL 5.9(L) 5.5(L) 5.3(L)  Total Bilirubin 0.3 - 1.2 mg/dL 3.5(H) 2.9(H) 2.4(H)  Alkaline Phos 38 - 126 U/L 95 82 78  AST 15 - 41 U/L 40 42(H) 46(H)  ALT 0 - 44 U/L _0 Lab Results  Component Value Date   WBC 10.4 04/09/2020   HGB 11.8 (L) 04/09/2020   HCT 35.7 (L) 04/09/2020   MCV 95.5 04/09/2020   PLT 180 04/09/2020   NEUTROABS 8.1 (H) 04/09/2020    CT ABDOMEN PELVIS WO CONTRAST  Result Date: 04/04/2020 CLINICAL DATA:  Vomiting history colon and pancreatic cancer EXAM: CT ABDOMEN AND PELVIS WITHOUT CONTRAST TECHNIQUE: Multidetector CT imaging of the abdomen and pelvis was performed following the standard protocol without IV contrast. COMPARISON:  March 03, 2020 FINDINGS: Lower chest: The visualized heart size within normal limits. No pericardial fluid/thickening. No hiatal hernia. Streaky atelectasis seen at both lung bases. There is tiny scattered sub 5 mm pulmonary nodules at both lung bases. Hepatobiliary: Although limited due to the lack of intravenous contrast, normal in appearance without gross focal abnormality. No evidence of calcified gallstones or biliary ductal dilatation. Pancreas: Within the tail of the pancreas there is slight interval decrease in the hypoechoic lesion with peripheral calcifications now measuring 1.3 x 1.1 cm, previously 2.0 x 1.6 cm. Spleen: Normal in size.  Although limited due to the lack of intravenous contrast, normal in appearance. Adrenals/Urinary Tract: Both adrenal glands appear normal. The kidneys and collecting system appear normal without evidence of urinary tract calculus or hydronephrosis. Bladder is unremarkable. Stomach/Bowel: The stomach, small bowel, are normal in appearance. Again noted is a left anterior abdominal colostomy. Remainder of the colon is unremarkable. Scattered colonic diverticula are noted. Vascular/Lymphatic: There are no enlarged abdominal or pelvic lymph nodes. Scattered aortic atherosclerosis seen. 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 Reproductive: The prostate is unremarkable. Other: A large amount of abdominopelvic ascites is noted. Small fat containing inguinal hernias are seen. Musculoskeletal: No acute or significant osseous findings. IMPRESSION: 1. Large amount of abdominopelvic ascites. 2. Slight interval decreased size in the pancreatic tail lesion, now measuring 1.3 x 1.1 cm. 3. No definite hepatic lesion identified, however limited due to lack of intravenous contrast. 4. Stable bilateral tiny pulmonary nodules. 5.  Aortic Atherosclerosis (ICD10-I70.0). Electronically Signed   By: Prudencio Pair M.D.   On: 04/04/2020 19:31   CT Head Wo Contrast  Result Date: 04/04/2020 CLINICAL DATA:  Dizziness, vomiting, colon cancer EXAM: CT HEAD WITHOUT CONTRAST TECHNIQUE: Contiguous axial images were obtained from the base of the skull through the vertex without intravenous contrast. COMPARISON:  03/03/2020 FINDINGS: Brain: Chronic encephalomalacia within the left temporal region is stable. No acute infarct or hemorrhage. Lateral ventricles and midline structures are  unremarkable. No acute extra-axial fluid collections. No mass effect. Vascular: No hyperdense vessel or unexpected calcification. Skull: Normal. Negative for fracture or focal lesion. Sinuses/Orbits: No acute finding. Other: None. IMPRESSION: 1. Stable exam, no acute  process. Electronically Signed   By: Randa Ngo M.D.   On: 04/04/2020 16:11   US Paracentesis  Result Date: 04/05/2020 INDICATION: Patient with history of colon cancer, pancreatic cancer, encephalopathy, recurrent ascites. Request received for diagnostic and therapeutic paracentesis. EXAM: ULTRASOUND GUIDED DIAGNOSTIC AND THERAPEUTIC PARACENTESIS MEDICATIONS: 1% lidocaine to skin and subcutaneous tissue COMPLICATIONS: None immediate. PROCEDURE: Informed written consent was obtained from the patient's spouse/son after a discussion of the risks, benefits and alternatives to treatment. A timeout was performed prior to the initiation of the procedure. Initial ultrasound scanning demonstrates a large amount of ascites within the right lower abdominal quadrant. The right lower abdomen was prepped and draped in the usual sterile fashion. 1% lidocaine was used for local anesthesia. Following this, a 19 gauge, 10 cm, Yueh catheter was introduced. An ultrasound image was saved for documentation purposes. The paracentesis was performed. The catheter was removed and a dressing was applied. The patient tolerated the procedure well without immediate post procedural complication. FINDINGS: A total of approximately 6.8 liters of hazy, yellow fluid was removed. Samples were sent to the laboratory as requested by the clinical team. IMPRESSION: Successful ultrasound-guided diagnostic and therapeutic paracentesis yielding 6.8 liters of peritoneal fluid. Read by: Rowe Robert, PA-C Electronically Signed   By: Jacqulynn Cadet M.D.   On: 04/05/2020 12:45   US Paracentesis  Result Date: 03/19/2020 INDICATION: Patient with history of colon and pancreatic cancer, recurrent ascites. Request made for therapeutic paracentesis up to 5 liters. EXAM: ULTRASOUND GUIDED THERAPEUTIC  PARACENTESIS MEDICATIONS: 1% lidocaine to skin and subcutaneous tissue COMPLICATIONS: None immediate. PROCEDURE: Informed written consent was obtained  from the patient after a discussion of the risks, benefits and alternatives to treatment. A timeout was performed prior to the initiation of the procedure. Initial ultrasound scanning demonstrates a large amount of ascites within the right lower abdominal quadrant. The right lower abdomen was prepped and draped in the usual sterile fashion. 1% lidocaine was used for local anesthesia. Following this, a 19 gauge, 10-cm, Yueh catheter was introduced. An ultrasound image was saved for documentation purposes. The paracentesis was performed. The catheter was removed and a dressing was applied. The patient tolerated the procedure well without immediate post procedural complication. FINDINGS: A total of approximately 5 liters of yellow fluid was removed. IMPRESSION: Successful ultrasound-guided therapeutic paracentesis yielding 5 liters of peritoneal fluid. Read by: Rowe Robert, PA-C Electronically Signed   By: Ruthann Cancer MD   On: 03/19/2020 12:35   IR Paracentesis  Result Date: 03/30/2020 INDICATION: Patient with a history of colon and pancreatic cancer with recurrent ascites. Interventional radiology asked to perform a therapeutic paracentesis up to 5 L. EXAM: ULTRASOUND GUIDED PARACENTESIS MEDICATIONS: 1% lidocaine 10 mL COMPLICATIONS: None immediate. PROCEDURE: Informed written consent was obtained from the patient after a discussion of the risks, benefits and alternatives to treatment. A timeout was performed prior to the initiation of the procedure. Initial ultrasound scanning demonstrates a large amount of ascites within the right lower abdominal quadrant. The right lower abdomen was prepped and draped in the usual sterile fashion. 1% lidocaine was used for local anesthesia. Following this, a 19 gauge, 7-cm, Yueh catheter was introduced. An ultrasound image was saved for documentation purposes. The paracentesis was performed. The catheter was removed and a dressing  was applied. The patient tolerated the  procedure well without immediate post procedural complication. FINDINGS: A total of approximately 4 L of hazy yellow fluid was removed. IMPRESSION: Successful ultrasound-guided paracentesis yielding 4 liters of peritoneal fluid. Read by: Soyla Dryer, NP Electronically Signed   By: Aletta Edouard M.D.   On: 03/30/2020 10:17    ASSESSMENT AND PLAN: 1. 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 Well-differentiated adenocarcinoma, 0/13 lymph nodes positive, MSI-stable, no loss of mismatch repair protein expression ? Tumor involving the rectosigmoid junction ? Sigmoidoscopy 09/18/2017-distal sigmoid colon tumor beginning between 15 and 20 cm from the dentate line, completely obstructing ? CT abdomen/pelvis 09/14/2017-sigmoid thickening no evidence of metastatic disease ? 2 mm right upper lobe nodule on CT 08/04/2017 ? Colonoscopy 02/07/2018-polyps removed from the cecum, ascending colon, transverse colon, descending colon, rectosigmoid colon and rectum (tubular adenomas)  2. Right lung pneumonia/empyema March 2019  Right VATS, drainage of empyema, and decortication of the right lower lobe 08/31/2017  3.CVA in 2010  4.Rectal and active sigmoid polyps noted on flexible sigmoidoscopy 09/18/2017  5.Pancreas tail mass/right liver lesions and 4 mm splenic lesion on CT abdomen/pelvis 05/27/2018  MRI abdomen 07/27/2018-right liver lesions, rim-enhancing lesion in the pancreas tail  Ultrasound-guided biopsy of a right liver lesion 08/14/2018-metastatic adenocarcinoma, cytokeratin 7+, CDX-2 weak positive, cytokeratin 20 negative, consistent with metastatic pancreas cancer; MSI stable; tumor mutational burden 0  CT abdomen/pelvis 09/26/2018-increased size of previously noted liver lesions, new right liver lesion, increased size of pancreas mass with occlusion of the splenic vein  Cycle 1  gemcitabine/Abraxane 10/11/2018  Cycle 2 gemcitabine/Abraxane 10/25/2018  Cycle 3 gemcitabine/Abraxane 11/08/2018  Cycle 4 gemcitabine/Abraxane 11/22/2018  Cycle 5 gemcitabine/Abraxane 12/06/2018  CT 12/18/2018-overall improved with reduced size of the hepatic metastatic lesions, mildly reduced size of the pancreatic tail mass.  Cycle 6 gemcitabine/Abraxane 12/20/2018  Cycle 7 gemcitabine/Abraxane 01/03/2019  Cycle 8 gemcitabine/Abraxane 01/17/2019  Cycle 9 gemcitabine/Abraxane 01/31/2019  Cycle 10 gemcitabine/Abraxane 02/13/2019  Cycle 11 gemcitabine/Abraxane 02/28/2019 (Abraxane held secondary to neuropathy symptoms)  Cycle 12 gemcitabine/Abraxane 03/14/2019 (Abraxane held due to persistent neuropathy symptoms)  Cycle 13 gemcitabine/Abraxane 03/28/2019 (Abraxane held due to persistent neuropathy symptoms)  Cycle 14 gemcitabine/Abraxane 04/11/2019 (Abraxane held due to persistent neuropathy symptoms)  CT abdomen/pelvis 04/28/2019-decrease in size of pancreatic mass. Decrease in size of hepatic metastatic foci.   Cycle 15 gemcitabine12/05/2018(Abraxane held due to neuropathy symptoms)  Cycle 16 gemcitabine 05/16/2019 (Abraxane held due to neuropathy symptoms)  Cycle 17 gemcitabine 05/31/2019 (Abraxane on hold due to neuropathy symptoms)  Cycle 18 gemcitabine 06/13/2019 (Abraxane held)  Cycle 19 gemcitabine 06/27/2019 (Abraxane held)  Cycle 20 gemcitabine 07/11/2019 (Abraxane held)  Cycle 21 gemcitabine 07/25/2019 (Abraxane held)  CTabdomen/pelvis 08/05/2019-stable small liver lesions, stable pancreas mass, no evidence of disease progression  Cycle 22 gemcitabine 08/08/2019 (Abraxane held)  Cycle 23 gemcitabine 08/22/2019 (Abraxane held)  Cycle 24 gemcitabine 09/05/2019 (Abraxane held)  Cycle 25 gemcitabine 09/19/2019 (Abraxane held)  CT abdomen/pelvis 09/30/2019-no change in pancreas mass, slight enlargement of right liver metastases, largest measuring 1 cm, no new lesions  Cycle  26 gemcitabine/Abraxane 10/03/2019-Abraxane resumed at a dose of 100 mg/m  Cycle 27 gemcitabine/Abraxane 10/17/2019  Cycle 28 gemcitabine/Abraxane 10/31/2019  Cycle 29 gemcitabine/Abraxane 11/14/2019  Cycle 30 gemcitabine/Abraxane 11/28/2019  Cycle 31 gemcitabine/Abraxane 12/12/2019  CT abdomen/pelvis 12/25/2019-stable right hepatic lesion, no new liver lesions, stable pancreas tail mass, tiny bilateral lung base nodules, new compared to a chest CT 09/25/2017 and some have progressed  compared to a's CT abdomen 08/05/2019  Cycle 32 gemcitabine/Abraxane 01/02/2020 (chemotherapy dose reduced due to neutropenia)  Cycle 33 gemcitabine/Abraxane 01/23/2020  Cycle 34 gemcitabine 02/13/2020 (Abraxane held secondary to neuropathy)  CT abdomen/pelvis 03/03/2020-no significant change in the appearance of tiny bilateral lung nodules at the lower lung zones. Previously characterized subcapsular metastatic liver lesion not seen on current study. No new focal liver abnormality. No biliary ductal dilatation. Index lesion distal tail of pancreas smaller. Interval development of large volume ascites within the abdomen and pelvis. No discrete peritoneal nodule. Portal vein patent. Esophageal and gastric varices identified similar to prior exam.  Gemcitabine every 2 weeks resumed 03/25/2020  CT abdomen/pelvis 04/04/2020-large amount of abdominopelvic ascites, slight decreased size in the pancreatic tail lesion, no definite hepatic lesion identified, stable bilateral tiny pulmonary nodules  6.Port-A-Cath placement by Dr. 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 Hospitalization 03/03/2020 with weakness, marked abdominal distention   CT abdomen/pelvis 03/03/2020-no significant change in the appearance of tiny bilateral lung nodules at the lower lung zones. Previously characterized subcapsular metastatic liver  lesion not seen on current study. No new focal liver abnormality. No biliary ductal dilatation. Index lesion distal tail of pancreas smaller. Interval development of large volume ascites within the abdomen and pelvis. No discrete peritoneal nodule. Portal vein patent. Esophageal and gastric varices identified similar to prior exam.  Brain CT 03/03/2020-negative  Paracentesis 03/03/2020-5 L of fluid removed. Cytology negative.  Paracentesis 03/07/2020-3.4 L of fluid removed. Reactive mesothelial cells.  10. 1 set of blood cultures from the Port-A-Cath positive for staph epidermidis-treated with course of vancomycin 11. Mild thrombocytopenia-likely secondary to chemotherapy 12.  Hospitalization 11/7/721-nausea, vomiting, abdominal distention, AKI, altered mental status  Mr. Condie appears unchanged.  Mental status seems to wax and wane as he was more alert earlier this morning and able to answer some questions.  At the time my visit, he would not answer questions for me. Ammonia level up slightly this morning.  Etiology of his altered mental status is not entirely clear and could be related to hepatic encephalopathy.  He has no evidence of progressive pancreas cancer. Cytology on ascites 04/05/2020 negative for malignant cells.  He is being followed by the palliative care team.  Family has transition to comfort measures and planning to discharge to [facility] when a bed is available.  Recommendations: 1.  Continue supportive care. 2.  With elevated ammonia level, would consider giving lactulose enema if patient tolerates. 3.  Comfort measures per family request with plan discharge to [facility].    LOS: 5 days   Mikey Bussing 04/09/20  Mr. Teschner is alert and appeared agitated when I saw him early this morning.  He was oriented to place and follows simple commands, but appeared confused.  The etiology of the delirium remains unclear.  This may be related to a combination of  hepatic encephalopathy, renal failure, and critical illness.  He may have unrecognized metastatic disease involving the CNS or an unrecognized CVA.  I discussed the situation with the son.  The son is in agreement with comfort care.  Please call oncology as needed over the weekend.  I will check on him 04/12/2020 if he remains in the hospital.

## 2020-04-09 NOTE — Discharge Summary (Signed)
Physician Discharge Summary  Alex [name] HQP:[id number] DOB: January 21, 1951 DOA: 04/04/2020  PCP: Antonietta Jewel, MD  Admit date: 04/04/2020 Discharge date: 04/09/2020  Time spent: 50 minutes  Recommendations for Outpatient Follow-up:  1. Patient be discharged to residential hospice home at [facility].  Follow-up with MD at [facility].   Discharge Diagnoses:  Principal Problem:   AKI (acute kidney injury) ([place]) Active Problems:   Hyponatremia   Ascites   Pancreatic cancer metastasized to liver (HCC)   Elevated LFTs   Hepatic encephalopathy (HCC)   Acute metabolic encephalopathy   Metabolic acidosis   Palliative care by specialist   General weakness   Discharge Condition: Stable  Diet recommendation: Comfort feeds  Filed Weights   04/04/20 1428  Weight: 95.3 kg    History of present illness:  HPI per Dr. 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 When asked, he states only approximately 3 episodes of vomiting at home prior to admission.  He also endorses worsening abdominal distention.  His last paracentesis was on 03/30/2020 with 4 L removed. He endorses urinating less than usual with darkening of color but cannot elaborate further. His ostomy bag output has remained at baseline with blend of solid and liquid stool.  ER work-up notable for: CT head unremarkable CT abdomen/pelvis: Large abdominal ascites, small decrease in pancreatic tail lesion.  Stable bilateral pulmonary nodules Labs: Na 131, K 4.9,  Cl 96, Co2 21, Glucose 122, BUN 58, Creat 2.3, Alb 2.5, AST 58, ALT 23, AP 133, TB 2.9 WBC 11.1, Hgb 13.2, Hct 39.3, PLTC 130 Lipase 57  He has no known history of CKD prior to October 2021 with normal renal function seen in the past.  Due to his decreased urine output, increased creatinine, increased abdominal distention he is admitted for further treatment of AKI and obtaining paracentesis.  Hospital Course:  1 acute metabolic encephalopathy secondary to hepatic encephalopathy Likely secondary to hepatic encephalopathy as patient noted to have a elevated ammonia level on admission.    Concern for also possibly secondary to critical illness, and possibly unrecognized metastatic disease involving the CNS.  Patient with stage IV adenocarcinoma of the pancreas and history of stage II adenocarcinoma of the rectosigmoid status post partial colectomy with end colostomy..  Patient with mets to the liver.  Patient on admission noted also to be in acute kidney injury.  ABG done on admission with a pH of 7.46, PCO2 of 24, PO2 of 90, bicarb of 17.3, O2 sats of 96.9.  Ammonia level elevated at 177 and trended down during the hospitalization with lactulose enemas.  CT head negative for any acute abnormalities.  Patient noted to be confused and agitated during the hospitalization.  Patient became a little more alert with lactulose enemas, however confused, not able to have a full conversation.  Patient noted to have poor oral intake.  Patient also noted to have some increased secretions/gurgling which improved with Robinul. Patient maintained on daily lactulose enemas.  Patient seen in consultation by GI who had recommended treating with lactulose, MiraLAX  and Xifaxan for diagnosis and therapeutic benefit however would require NG tube administration however family per GI  declined NG tube placement and wanted to focus on comfort care.  Palliative care consulted have assessed the patient discussed with family and  decision was made to transition to comfort measures with disposition to residential hospice home.  Patient was made comfortable.  Patient be discharged to residential hospice home at [facility].  2.  History of metastatic stage IV adenocarcinoma of the pancreas, history of stage II adenocarcinoma of the rectosigmoid status post partial colectomy with end colostomy Patient diagnosed with pancreatic cancer May 27, 2018 being followed by oncology.  Patient currently on gemcitabine every 2 weeks with last dose 03/26/2019.  Patient declining with declining quality of life, recurrent hospitalizations and recurrent ascites.  Patient status post paracentesis with 6.8 L removed.  Oncology notified and follow the patient throughout the hospitalization.  Patient initially placed on scheduled IV Dilaudid which was subsequently discontinued per oncology.  Patient was also placed on IV Dilaudid as needed for pain control. Palliative care consulted and decision was made to transition to full comfort measures.  Oncology following and does not feel worsening progression of cancer at this time.  Patient however with poor oral intake.  Patient with metabolic encephalopathy.  Patient deteriorating.  Palliative care was consulted and met with family and decision was made to transition to full comfort measures.  Patient will be discharged to residential hospice home at [facility].   3.  Acute kidney injury/metabolic acidosis ??  Etiology.  May be secondary to a prerenal azotemia versus secondary to hepatorenal syndrome in the setting of diuretics and spironolactone.  Last creatinine of 1.28 on 03/25/2020.  Patient noted to have presented with nausea vomiting and worsening abdominal distention.  Creatinine currently at 1.86.  CT abdomen and pelvis done on admission negative for hydronephrosis or urinary tract calculus.  Bladder unremarkable.  Patient status post ultrasound-guided paracentesis with 6.8 L removed.   Urinalysis not done on admission.  Patient with during the hospitalization.  Patient received a dose of IV albumin.  Patient's HCTZ and spironolactone were held during the hospitalization.  Patient was seen by palliative care and decision was made to transition to full comfort measures.  Patient be discharged to residential hospice home at [facility].   4.  Hyponatremia Likely secondary to hypovolemic hyponatremia.  Status post ultrasound-guided paracentesis.    Patient hydrated gently.  Patient also noted with poor oral intake.  Sodium level trended slightly up to 147 by day of discharge.  Patient transition to comfort measures.  Patient will be discharged to residential hospice home.   5.  Normocytic anemia Patient with no overt bleeding.  Hemoglobin stable and was 11.8 by day of discharge.  Palliative care was consulted and follow the patient throughout the hospitalization.  Palliative care met with family and decision made to transition to comfort measures.   6.  Ascites Questionable etiology.  Patient underwent paracentesis with cytology done 03/03/2020, 03/08/2019 which was negative for malignancy.  Status post ultrasound-guided paracentesis during this hospitalization with 6.8 L removed and cytology with reactive mesothelial cells.  Placed on lactulose enemas.  Patient transition to comfort measures.  Patient will be discharged to residential hospice home at  [facility]   7.  Obesity  8.  Prognosis Patient with history of stage II rectosigmoid adenocarcinoma status post partial colectomy with end colostomy, stage IV pancreatic cancer with mets to the liver, splenic lesion, bilateral  lung base nodules.  Patient presenting with altered mental status.  Ammonia levels elevated but trended down.  Concern for hepatic encephalopathy.  Patient also noted to have ascites status post ultrasound-guided paracentesis.  Patient still confused.  Patient deteriorating.  Patient currently receiving  lactulose enemas.  Patient with no oral intake.  Patient with some gurgling noted.  Patient overall with a poor prognosis.  Patient seen in consultation by palliative care who discussed with family and decision was made to transition to full comfort measures with likely disposition of residential hospice home.  Patient currently being kept comfortable.  Continue lactulose enemas to see if any significant improvement.  Patient with no oral intake.  Per RN patient unsafe for oral medications at this time.  Patient be discharged to residential hospice home at [facility].     Procedures:  CT abdomen and pelvis 04/04/2020  CT head without contrast 04/04/2020  Ultrasound-guided paracentesis 04/05/2020 per IR Dr. Devra Dopp L of peritoneal fluid removed  Consultations:  Palliative care: Dr. Rowe Pavy  Oncology: Dr. Benay Spice  Gastroenterology: Dr. Tarri Glenn 04/05/2020  Discharge Exam: Vitals:   04/07/20 2331 04/08/20 2313  BP: 130/74 (!) 151/81  Pulse: (!) 103 (!) 108  Resp:  (!) 21  Temp:  (!) 97.4 F (36.3 C)  SpO2: (!) 87% 94%    General: Drowsy Cardiovascular: RRR  Respiratory: Some coarse breath sounds anterior lung fields. GI: Abdomen is distended, soft, positive bowel sounds, colostomy with stool noted.  Discharge Instructions   Discharge Instructions    Diet - low sodium heart healthy   Complete by: As directed    Comfort feeds   Increase activity slowly   Complete by: As directed      Allergies as of 04/09/2020   No Known Allergies     Medication List    STOP taking these medications   docusate sodium 100 MG capsule Commonly known as: COLACE   hydrochlorothiazide 25 MG tablet Commonly known as: HYDRODIURIL   ondansetron 4 MG tablet Commonly known as: ZOFRAN   prochlorperazine 10 MG tablet Commonly known as: COMPAZINE   simvastatin 20 MG tablet Commonly known as: ZOCOR     TAKE these medications   acetaminophen 500 MG tablet Commonly known as:  TYLENOL Take 2 tablets (1,000 mg total) by mouth every 6 (six) hours as needed. What changed:   how much to take  reasons to take this   amLODipine 10 MG tablet Commonly known as: NORVASC Take 10 mg by mouth daily.   aspirin EC 81 MG tablet Take 81 mg by mouth daily. Swallow whole.   atenolol 25 MG tablet Commonly known as: TENORMIN Take 25 mg by mouth daily.   clopidogrel 75 MG tablet Commonly known as: PLAVIX Take 75 mg by mouth daily.   furosemide 40 MG tablet Commonly known as: LASIX Take 1 tablet (40 mg total) by mouth daily.   gabapentin 100 MG capsule Commonly known as: NEURONTIN Take 200 mg by mouth at bedtime.   glycopyrrolate 1 MG tablet Commonly known as: ROBINUL Take 1 tablet (1 mg total) by mouth every 4 (four) hours as needed (excessive secretions).   haloperidol 0.5 MG tablet Commonly known as: HALDOL Take 1 tablet (0.5 mg total) by mouth every 4 (four) hours as needed for agitation (or delirium).   lactulose 10 GM/15ML Soln enema Commonly known as: CHRONULAC Place 300 mLs rectally 2 (two) times daily.   lidocaine-prilocaine cream Commonly known as: EMLA Apply 1 application topically as needed. Apply  to portacath  1 1/2 -  2 hours prior to procedures as needed. What changed:   reasons to take this  additional instructions   LORazepam 2 MG/ML concentrated solution Commonly known as: ATIVAN Place 0.5 mLs (1 mg total) under the tongue every 4 (four) hours as needed for anxiety.   morphine 20 MG/ML concentrated solution Commonly known as: ROXANOL Take 0.25 mLs (5 mg total) by mouth every 4 (four) hours as needed for severe pain.   ondansetron 4 MG disintegrating tablet Commonly known as: ZOFRAN-ODT Take 1 tablet (4 mg total) by mouth every 6 (six) hours as needed for nausea.   spironolactone 50 MG tablet Commonly known as: ALDACTONE Take 1 tablet (50 mg total) by mouth daily.   temazepam 15 MG capsule Commonly known as: RESTORIL Take 1  capsule (15 mg total) by mouth at bedtime as needed for sleep.      No Known Allergies  Follow-up Information    MD [facility] Follow up.                The results of significant diagnostics from this hospitalization (including imaging, microbiology, ancillary and laboratory) are listed below for reference.    Significant Diagnostic Studies: CT ABDOMEN PELVIS WO CONTRAST  Result Date: 04/04/2020 CLINICAL DATA:  Vomiting history colon and pancreatic cancer EXAM: CT ABDOMEN AND PELVIS WITHOUT CONTRAST TECHNIQUE: Multidetector CT imaging of the abdomen and pelvis was performed following the standard protocol without IV contrast. COMPARISON:  March 03, 2020 FINDINGS: Lower chest: The visualized heart size within normal limits. No pericardial fluid/thickening. No hiatal hernia. Streaky atelectasis seen at both lung bases. There is tiny scattered sub 5 mm pulmonary nodules at both lung bases. Hepatobiliary: Although limited due to the lack of intravenous contrast, normal in appearance without gross focal abnormality. No evidence of calcified gallstones or biliary ductal dilatation. Pancreas: Within the tail of the pancreas there is slight interval decrease in the hypoechoic lesion with peripheral calcifications now measuring 1.3 x 1.1 cm, previously 2.0 x 1.6 cm. Spleen: Normal in size. Although limited due to the lack of intravenous contrast, normal in appearance. Adrenals/Urinary Tract: Both adrenal glands appear normal. The kidneys and collecting system appear normal without evidence of urinary tract calculus or hydronephrosis. Bladder is unremarkable. Stomach/Bowel: The stomach, small bowel, are normal in appearance. Again noted is a left anterior abdominal colostomy. Remainder of the colon is unremarkable. Scattered colonic diverticula are noted. Vascular/Lymphatic: There are no enlarged abdominal or pelvic lymph nodes. Scattered aortic atherosclerosis seen. 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 Reproductive: The prostate is unremarkable. Other: A large amount of abdominopelvic ascites is noted. Small fat containing inguinal hernias are seen. Musculoskeletal: No acute or significant osseous findings. IMPRESSION: 1. Large amount of abdominopelvic ascites. 2. Slight interval decreased size in the pancreatic tail lesion, now measuring 1.3 x 1.1 cm. 3. No definite hepatic lesion identified, however limited due to lack of intravenous contrast. 4. Stable bilateral tiny pulmonary nodules. 5.  Aortic Atherosclerosis (ICD10-I70.0). Electronically Signed   By: Prudencio Pair M.D.   On: 04/04/2020 19:31   CT Head Wo Contrast  Result Date: 04/04/2020 CLINICAL DATA:  Dizziness, vomiting, colon cancer EXAM: CT HEAD WITHOUT CONTRAST TECHNIQUE: Contiguous axial images were obtained from the base of the skull through the vertex without intravenous contrast. COMPARISON:  03/03/2020 FINDINGS: Brain: Chronic encephalomalacia within the left temporal region is stable. No acute infarct or hemorrhage. Lateral ventricles and midline structures are unremarkable. No acute extra-axial fluid  collections. No mass effect. Vascular: No hyperdense vessel or unexpected calcification. Skull: Normal. Negative for fracture or focal lesion. Sinuses/Orbits: No acute finding. Other: None. IMPRESSION: 1. Stable exam, no acute process. Electronically Signed   By: Randa Ngo M.D.   On: 04/04/2020 16:11   US Paracentesis  Result Date: 04/05/2020 INDICATION: Patient with history of colon cancer, pancreatic cancer, encephalopathy, recurrent ascites. Request received for diagnostic and therapeutic paracentesis. EXAM: ULTRASOUND GUIDED DIAGNOSTIC AND THERAPEUTIC PARACENTESIS MEDICATIONS: 1% lidocaine to skin and subcutaneous tissue COMPLICATIONS: None immediate. PROCEDURE: Informed written consent was obtained from the patient's spouse/son after a discussion of the risks, benefits and alternatives to treatment. A timeout was performed  prior to the initiation of the procedure. Initial ultrasound scanning demonstrates a large amount of ascites within the right lower abdominal quadrant. The right lower abdomen was prepped and draped in the usual sterile fashion. 1% lidocaine was used for local anesthesia. Following this, a 19 gauge, 10 cm, Yueh catheter was introduced. An ultrasound image was saved for documentation purposes. The paracentesis was performed. The catheter was removed and a dressing was applied. The patient tolerated the procedure well without immediate post procedural complication. FINDINGS: A total of approximately 6.8 liters of hazy, yellow fluid was removed. Samples were sent to the laboratory as requested by the clinical team. IMPRESSION: Successful ultrasound-guided diagnostic and therapeutic paracentesis yielding 6.8 liters of peritoneal fluid. Read by: Rowe Robert, PA-C Electronically Signed   By: Jacqulynn Cadet M.D.   On: 04/05/2020 12:45   US Paracentesis  Result Date: 03/19/2020 INDICATION: Patient with history of colon and pancreatic cancer, recurrent ascites. Request made for therapeutic paracentesis up to 5 liters. EXAM: ULTRASOUND GUIDED THERAPEUTIC  PARACENTESIS MEDICATIONS: 1% lidocaine to skin and subcutaneous tissue COMPLICATIONS: None immediate. PROCEDURE: Informed written consent was obtained from the patient after a discussion of the risks, benefits and alternatives to treatment. A timeout was performed prior to the initiation of the procedure. Initial ultrasound scanning demonstrates a large amount of ascites within the right lower abdominal quadrant. The right lower abdomen was prepped and draped in the usual sterile fashion. 1% lidocaine was used for local anesthesia. Following this, a 19 gauge, 10-cm, Yueh catheter was introduced. An ultrasound image was saved for documentation purposes. The paracentesis was performed. The catheter was removed and a dressing was applied. The patient tolerated the  procedure well without immediate post procedural complication. FINDINGS: A total of approximately 5 liters of yellow fluid was removed. IMPRESSION: Successful ultrasound-guided therapeutic paracentesis yielding 5 liters of peritoneal fluid. Read by: Rowe Robert, PA-C Electronically Signed   By: Ruthann Cancer MD   On: 03/19/2020 12:35   IR Paracentesis  Result Date: 03/30/2020 INDICATION: Patient with a history of colon and pancreatic cancer with recurrent ascites. Interventional radiology asked to perform a therapeutic paracentesis up to 5 L. EXAM: ULTRASOUND GUIDED PARACENTESIS MEDICATIONS: 1% lidocaine 10 mL COMPLICATIONS: None immediate. PROCEDURE: Informed written consent was obtained from the patient after a discussion of the risks, benefits and alternatives to treatment. A timeout was performed prior to the initiation of the procedure. Initial ultrasound scanning demonstrates a large amount of ascites within the right lower abdominal quadrant. The right lower abdomen was prepped and draped in the usual sterile fashion. 1% lidocaine was used for local anesthesia. Following this, a 19 gauge, 7-cm, Yueh catheter was introduced. An ultrasound image was saved for documentation purposes. The paracentesis was performed. The catheter was removed and a dressing was applied. The patient tolerated  the procedure well without immediate post procedural complication. FINDINGS: A total of approximately 4 L of hazy yellow fluid was removed. IMPRESSION: Successful ultrasound-guided paracentesis yielding 4 liters of peritoneal fluid. Read by: Soyla Dryer, NP Electronically Signed   By: Aletta Edouard M.D.   On: 03/30/2020 10:17    Microbiology: Recent Results (from the past 240 hour(s))  Resp Panel by RT PCR (RSV, Flu A&B, Covid) - Nasopharyngeal Swab     Status: None   Collection Time: 04/04/20  8:13 PM   Specimen: Nasopharyngeal Swab  Result Value Ref Range Status   SARS Coronavirus 2 by RT PCR NEGATIVE  NEGATIVE Final    Comment: (NOTE) SARS-CoV-2 target nucleic acids are NOT DETECTED.  The SARS-CoV-2 RNA is generally detectable in upper respiratoy specimens during the acute phase of infection. The lowest concentration of SARS-CoV-2 viral copies this assay can detect is 131 copies/mL. A negative result does not preclude SARS-Cov-2 infection and should not be used as the sole basis for treatment or other patient management decisions. A negative result may occur with  improper specimen collection/handling, submission of specimen other than nasopharyngeal swab, presence of viral mutation(s) within the areas targeted by this assay, and inadequate number of viral copies (<131 copies/mL). A negative result must be combined with clinical observations, patient history, and epidemiological information. The expected result is Negative.  Fact Sheet for Patients:  PinkCheek.be  Fact Sheet for Healthcare Providers:  GravelBags.it  This test is no t yet approved or cleared by the [place] FDA and  has been authorized for detection and/or diagnosis of SARS-CoV-2 by FDA under an Emergency Use Authorization (EUA). This EUA will remain  in effect (meaning this test can be used) for the duration of the COVID-19 declaration under Section 564(b)(1) of the Act, 21 U.S.C. section 360bbb-3(b)(1), unless the authorization is terminated or revoked sooner.     Influenza A by PCR NEGATIVE NEGATIVE Final   Influenza B by PCR NEGATIVE NEGATIVE Final    Comment: (NOTE) The Xpert Xpress SARS-CoV-2/FLU/RSV assay is intended as an aid in  the diagnosis of influenza from Nasopharyngeal swab specimens and  should not be used as a sole basis for treatment. Nasal washings and  aspirates are unacceptable for Xpert Xpress SARS-CoV-2/FLU/RSV  testing.  Fact Sheet for Patients: PinkCheek.be  Fact Sheet for Healthcare  Providers: GravelBags.it  This test is not yet approved or cleared by the [place] FDA and  has been authorized for detection and/or diagnosis of SARS-CoV-2 by  FDA under an Emergency Use Authorization (EUA). This EUA will remain  in effect (meaning this test can be used) for the duration of the  Covid-19 declaration under Section 564(b)(1) of the Act, 21  U.S.C. section 360bbb-3(b)(1), unless the authorization is  terminated or revoked.    Respiratory Syncytial Virus by PCR NEGATIVE NEGATIVE Final    Comment: (NOTE) Fact Sheet for Patients: PinkCheek.be  Fact Sheet for Healthcare Providers: GravelBags.it  This test is not yet approved or cleared by the [place] FDA and  has been authorized for detection and/or diagnosis of SARS-CoV-2 by  FDA under an Emergency Use Authorization (EUA). This EUA will remain  in effect (meaning this test can be used) for the duration of the  COVID-19 declaration under Section 564(b)(1) of the Act, 21 U.S.C.  section 360bbb-3(b)(1), unless the authorization is terminated or  revoked. Performed at [facility], [place] [address] [place], [place] [address]   Culture, body fluid-bottle  Status: None (Preliminary result)   Collection Time: 04/05/20 12:22 PM   Specimen: Fluid  Result Value Ref Range Status   Specimen Description FLUID PERITONEAL  Final   Special Requests BOTTLES DRAWN AEROBIC AND ANAEROBIC  Final   Culture   Final    NO GROWTH 4 DAYS Performed at [facility], [place] [address], [place], [place] [address]    Report Status PENDING  Incomplete  Gram stain     Status: None   Collection Time: 04/05/20 12:22 PM   Specimen: Fluid  Result Value Ref Range Status   Specimen Description FLUID PERITONEAL  Final   Special Requests NONE  Final   Gram Stain   Final    NO WBC SEEN NO ORGANISMS SEEN CYTOSPIN SMEAR Performed  at [facility], [address], [place], [place] [address]    Report Status 04/05/2020 FINAL  Final     Labs: Basic Metabolic Panel: Recent Labs  Lab 04/05/20 0256 04/06/20 0335 04/07/20 0421 04/08/20 0420 04/09/20 0215  NA 133* 136 139 143 147*  K 5.0 4.3 3.7 4.0 4.2  CL 96* 101 107 110 112*  CO2 17* _0 21*  GLUCOSE 134* 104* 84 98 126*  BUN 55* 65* 65* 62* 67*  CREATININE 2.73* 2.52* 1.92* 1.86* 1.77*  CALCIUM 8.0* 8.0* 7.8* 7.9* 7.9*  MG 2.5* 2.6*  --   --   --   PHOS  --  5.7*  --   --   --    Liver Function Tests: Recent Labs  Lab 04/05/20 0256 04/06/20 0335 04/07/20 0421 04/08/20 0420 04/09/20 0215  AST 62* 55* 46* 42* 40  ALT <_1 ALKPHOS 125 102 78 82 95  BILITOT 3.2* 3.3* 2.4* 2.9* 3.5*  PROT 6.0* 6.1* 5.3* 5.5* 5.9*  ALBUMIN 2.6* 3.1* 3.0* 2.8* 3.0*   Recent Labs  Lab 04/04/20 1500  LIPASE 57*   Recent Labs  Lab 04/05/20 0256 04/06/20 0350 04/07/20 0421 04/08/20 0420 04/09/20 0919  AMMONIA 177* 80* 31 36* 49*   CBC: Recent Labs  Lab 04/04/20 1500 04/05/20 0256 04/06/20 0335 04/07/20 0421 04/09/20 0215  WBC 11.1* 14.4* 12.4* 8.1 10.4  NEUTROABS 8.1* 11.3* 9.6*  --  8.1*  HGB 13.2 11.4* 10.8* 9.5* 11.8*  HCT 39.3 34.3* 32.0* 28.4* 35.7*  MCV 91.8 93.2 91.4 95.3 95.5  PLT 130* 165 116* 100* 180   Cardiac Enzymes: No results for input(s): CKTOTAL, CKMB, CKMBINDEX, TROPONINI in the last 168 hours. BNP: BNP (last 3 results) Recent Labs    03/03/20 1230  BNP 105.7*    ProBNP (last 3 results) No results for input(s): PROBNP in the last 8760 hours.  CBG: No results for input(s): GLUCAP in the last 168 hours.     Signed:  Irine Seal MD.  [facility] 04/09/2020, 3:14 PM

## 2020-04-09 NOTE — Progress Notes (Signed)
  [facility]tar is here to transport the pt to [facility]. Made Tim the pt's son awear and Crystal the pt's daughter, v/s stable belonging sent with the Pt, report given to the new faulty by  Hulan Fess on day shift.

## 2020-04-10 LAB — CULTURE, BODY FLUID W GRAM STAIN -BOTTLE: Culture: NO GROWTH

## 2020-04-28 DEATH — deceased

## 2021-03-07 IMAGING — US ULTRASOUND BIOPSY CORE LIVER
1 series · 9 of 9 positions shown · non-contrast
Comparison: none

INDICATION: 67-year-old male with a history of colon cancer and pancreatic mass
with small liver lesions concerning for metastatic disease. Patient
presents for ultrasound-guided core biopsy.

[Series 1: ultrasound biopsy core liver · 9 acquisitions, 9 frames shown]
[im 1/9]
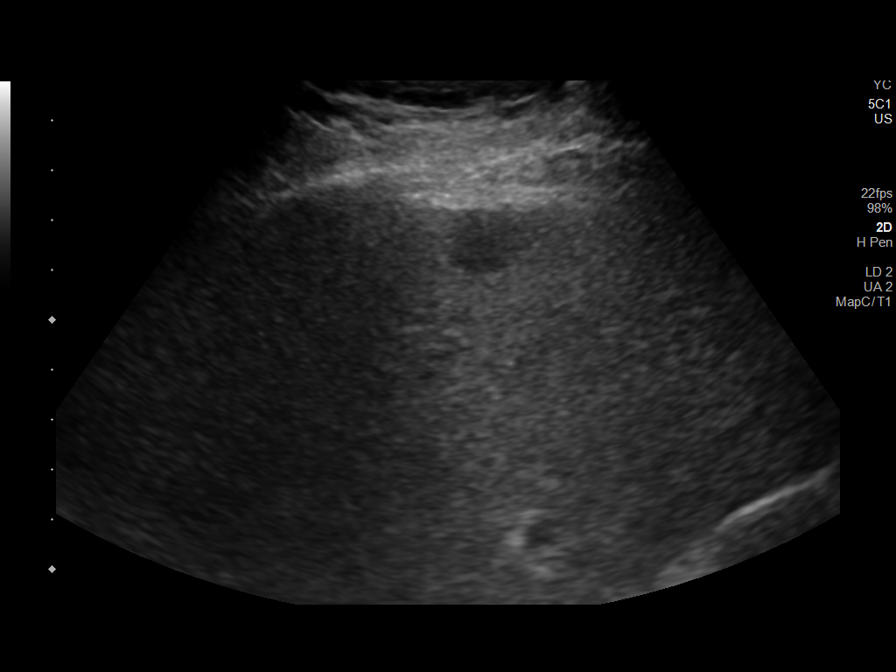
[im 2/9]
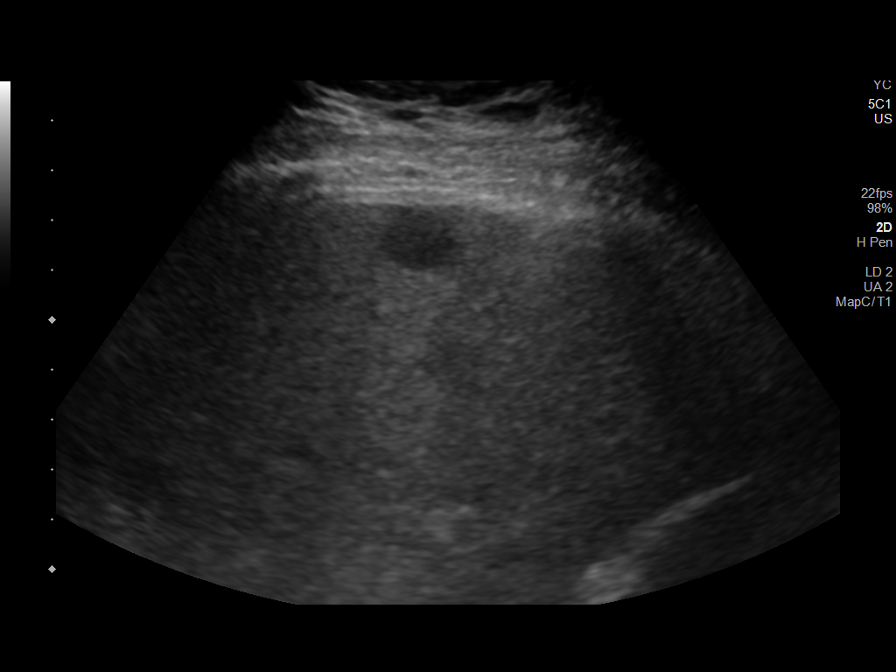
[im 3/9]
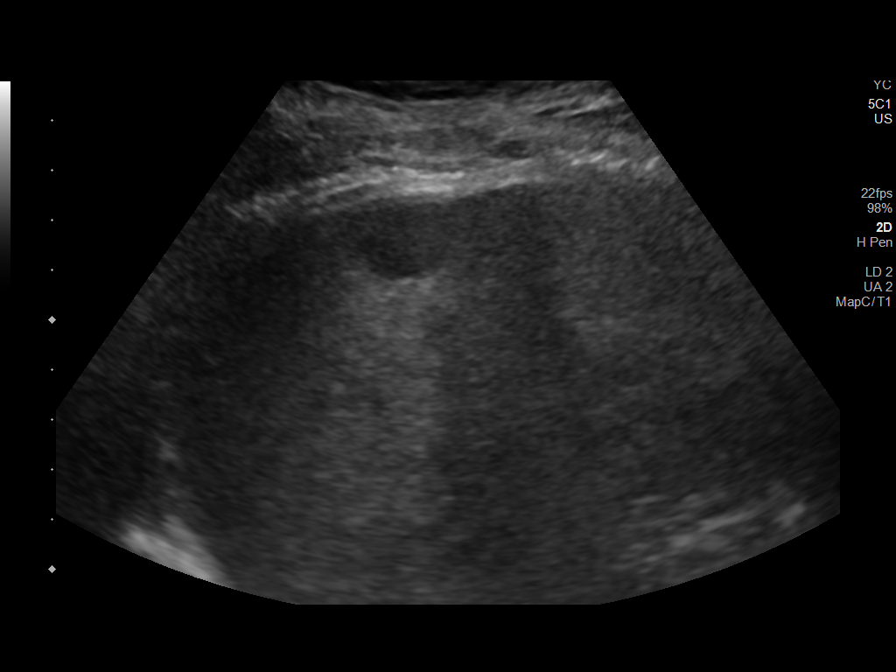
[im 4/9]
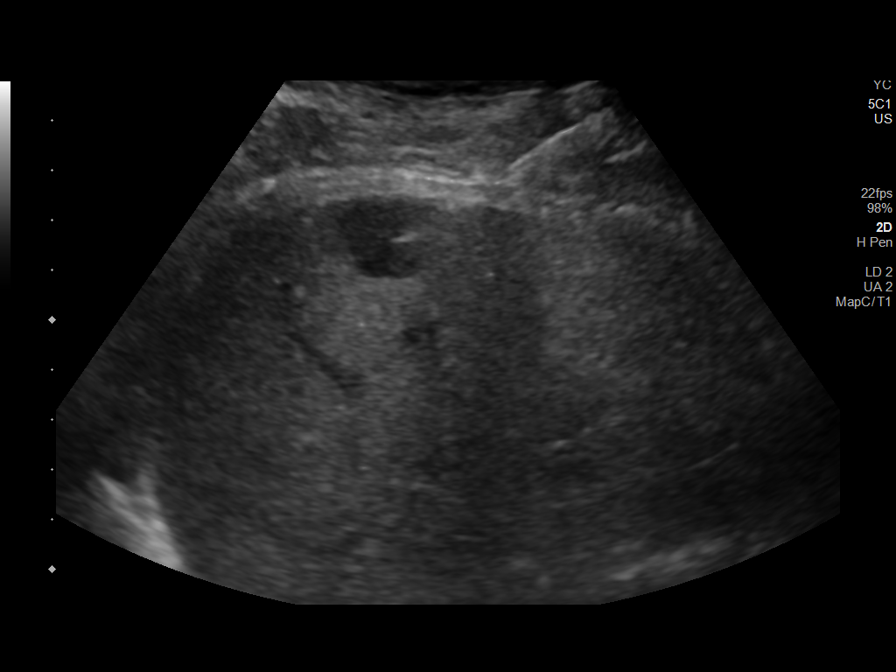
[im 5/9]
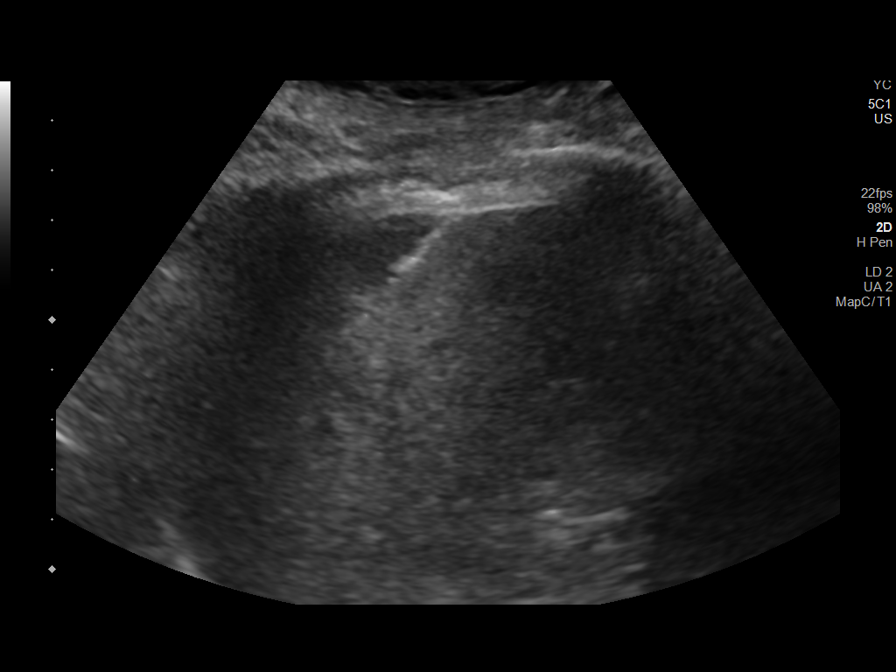
[im 6/9]
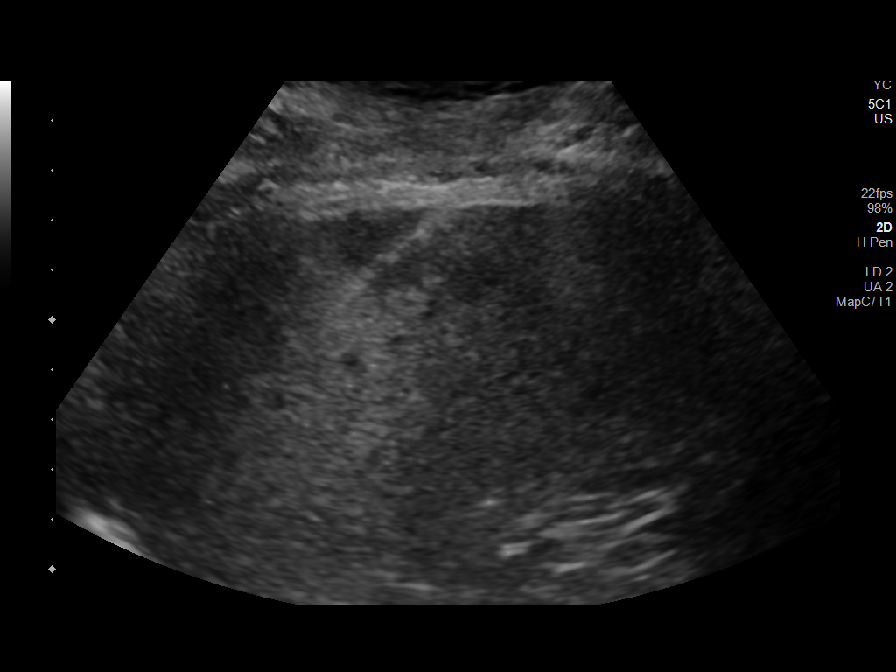
[im 7/9]
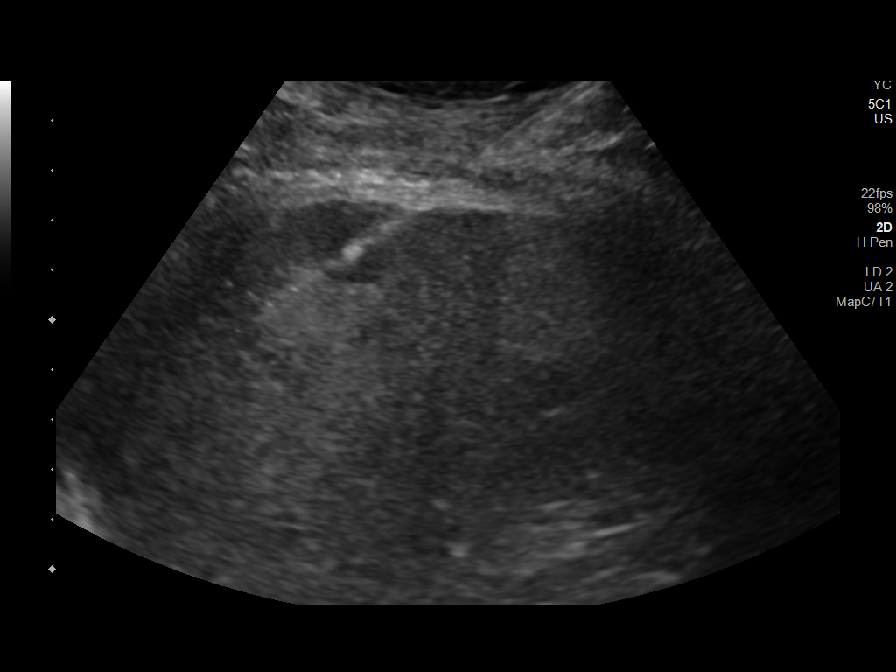
[im 8/9]
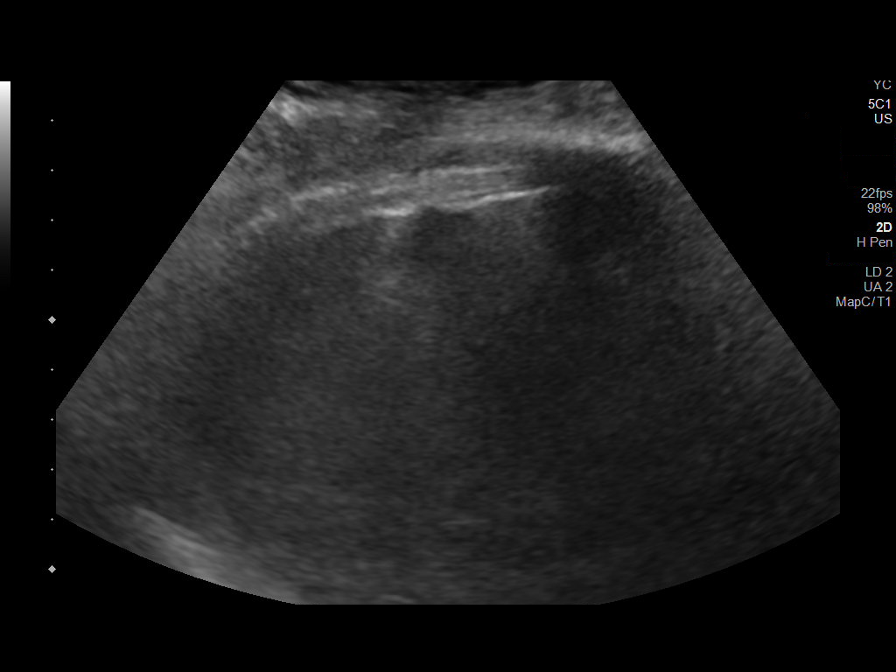
[im 9/9]
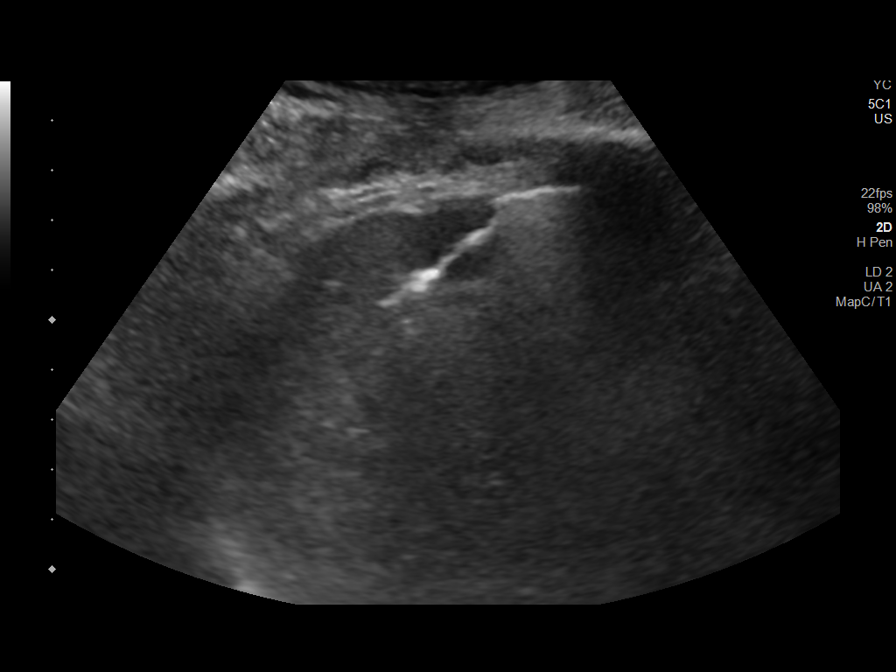

[9 of 9 positions shown; findings below may reference images not displayed]

EXAM:
Ultrasound-guided liver lesion biopsy

MEDICATIONS:
None.

ANESTHESIA/SEDATION:
Moderate (conscious) sedation was employed during this procedure. A
total of Versed 2 mg and Fentanyl 50 mcg was administered
intravenously.

Moderate Sedation Time: 11 minutes. The patient's level of
consciousness and vital signs were monitored continuously by
radiology nursing throughout the procedure under my direct
supervision.

FLUOROSCOPY TIME:  None

COMPLICATIONS:
None immediate.

PROCEDURE:
Informed written consent was obtained from the patient after a
thorough discussion of the procedural risks, benefits and
alternatives. All questions were addressed. Maximal Sterile Barrier
Technique was utilized including caps, mask, sterile gowns, sterile
gloves, sterile drape, hand hygiene and skin antiseptic. A timeout
was performed prior to the initiation of the procedure.

The right upper quadrant was interrogated with ultrasound. The
background liver parenchyma is diffusely echogenic and slightly
coarsened consistent with hepatic steatosis. Multiple small
hypoechoic solid lesions are identified. A suitable lesion measuring
approximately 1.6 x 1.6 cm was identified and selected. An
appropriate skin entry site was selected and marked. The overlying
skin was sterilely prepped and draped in the standard fashion using
chlorhexidine skin prep. Local anesthesia was attained by
infiltration with 1% lidocaine.

A small dermatotomy was made. Under real-time ultrasound guidance, a
17 gauge introducer needle was advanced into the margin of the
nodule. Multiple 18 gauge core biopsies were then obtained using the
Kahlil Soler automated biopsy device. Biopsy specimens were placed in
formalin and delivered to pathology for further analysis.

As the introducer needle was removed, the biopsy tract was embolized
with a Gel-Foam slurry. There was no evidence of active hemorrhage
by ultrasound following biopsy. The patient tolerated the procedure
well.
IMPRESSION: Technically successful ultrasound-guided core biopsy of liver
lesion.

## 2021-04-13 IMAGING — CR PORTABLE CHEST - 1 VIEW
1 series · 1 of 1 positions shown · non-contrast
Comparison: November 07, 2017

CLINICAL DATA: Port-A-Cath placement.  History of colon carcinoma

EXAM:
PORTABLE CHEST 1 VIEW

[chest ap]
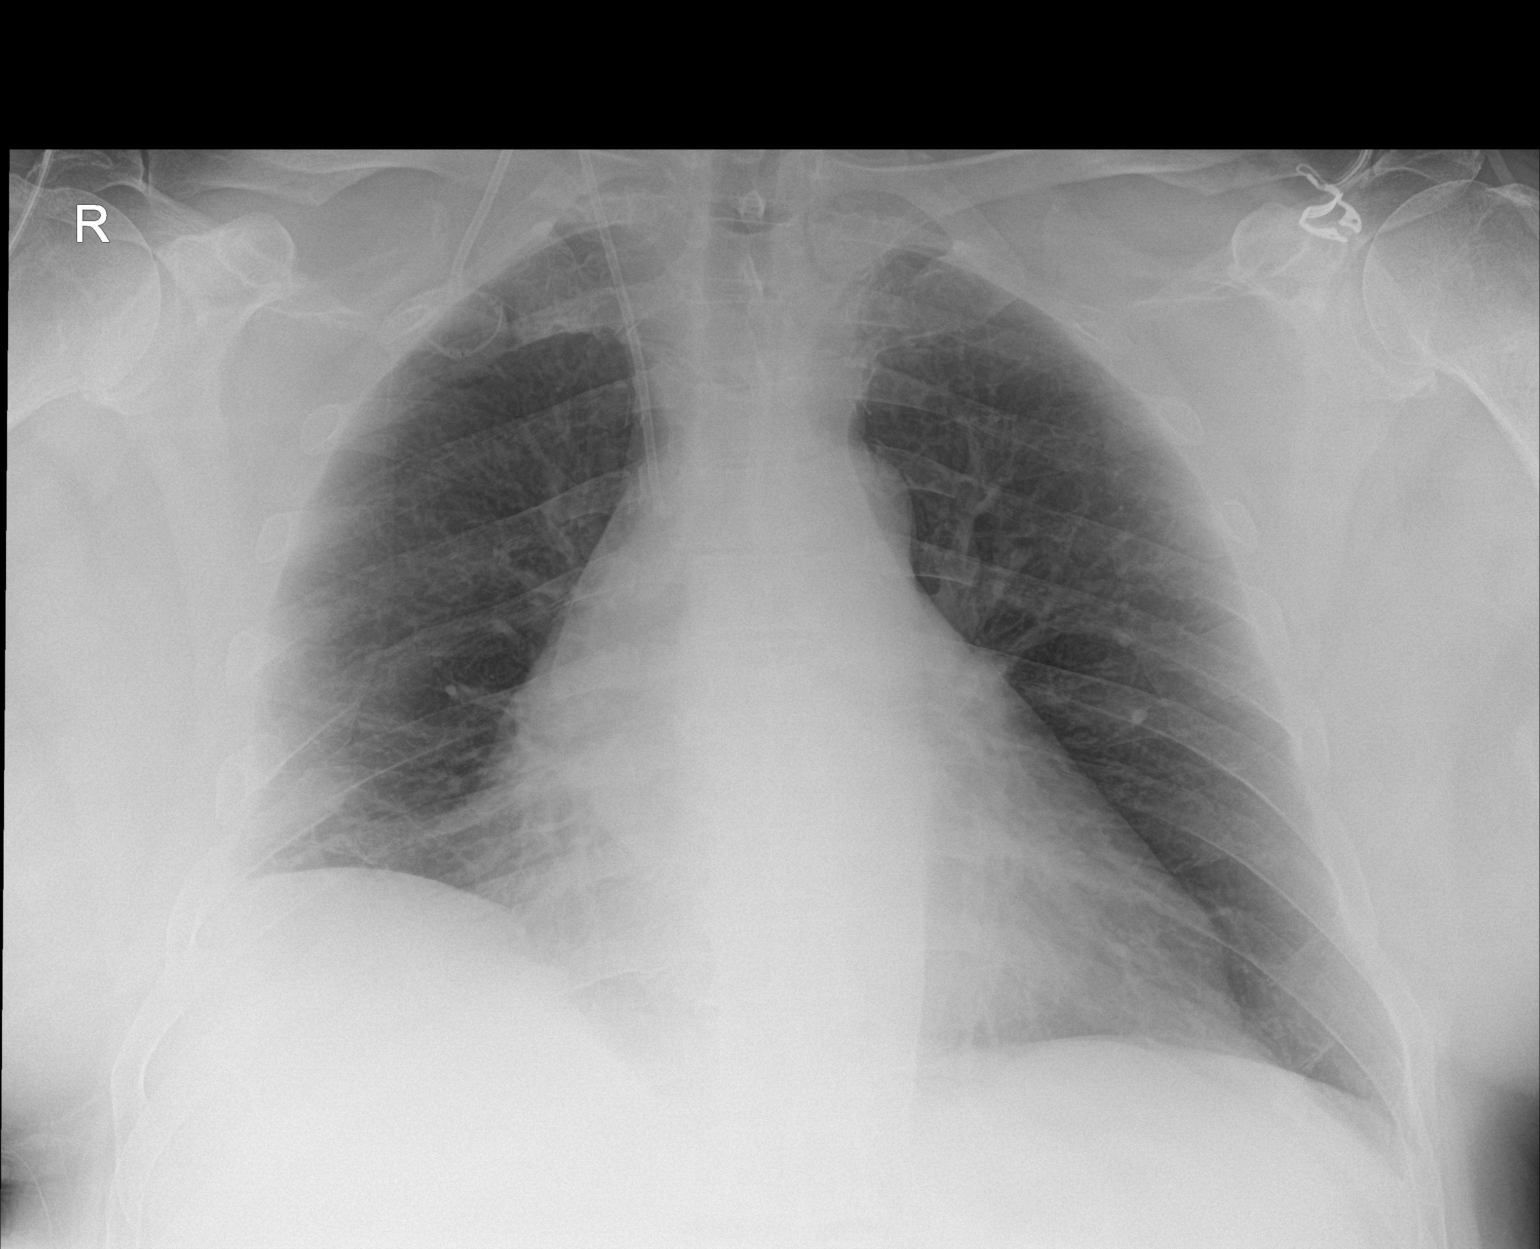

[1 of 1 positions shown; findings below may reference images not displayed]

FINDINGS: Port-A-Cath tip is in the superior vena cava. No pneumothorax. There
is mild scarring in the right base. The lungs elsewhere are clear.
Heart is mildly enlarged with pulmonary vascularity normal. No
adenopathy. There is degenerative change in each shoulder.
IMPRESSION: Port-A-Cath tip in superior vena cava. No pneumothorax. Scarring
right base. No edema or consolidation. Heart mildly enlarged. No
adenopathy evident.
